# Patient Record
Sex: Female | Born: 1955 | Race: Black or African American | Hispanic: No | Marital: Single | Smoking: Former smoker
Health system: Southern US, Community
[De-identification: ages and names within clinical notes are randomized; demographics above are authoritative.]

## PROBLEM LIST (undated history)

## (undated) DIAGNOSIS — C50919 Malignant neoplasm of unspecified site of unspecified female breast: Secondary | ICD-10-CM

## (undated) DIAGNOSIS — I10 Essential (primary) hypertension: Secondary | ICD-10-CM

## (undated) DIAGNOSIS — B20 Human immunodeficiency virus [HIV] disease: Secondary | ICD-10-CM

## (undated) HISTORY — DX: Malignant neoplasm of unspecified site of unspecified female breast: C50.919

## (undated) HISTORY — PX: APPENDECTOMY: SHX54

## (undated) HISTORY — PX: ABDOMINAL HYSTERECTOMY: SHX81

---

## 1991-07-29 DIAGNOSIS — Z21 Asymptomatic human immunodeficiency virus [HIV] infection status: Secondary | ICD-10-CM

## 1991-07-29 DIAGNOSIS — B20 Human immunodeficiency virus [HIV] disease: Secondary | ICD-10-CM

## 1991-07-29 HISTORY — DX: Asymptomatic human immunodeficiency virus (hiv) infection status: Z21

## 1991-07-29 HISTORY — DX: Human immunodeficiency virus (HIV) disease: B20

## 1997-12-01 ENCOUNTER — Encounter: Admission: RE | Admit: 1997-12-01 | Discharge: 1997-12-01 | Payer: Self-pay | Admitting: Family Medicine

## 1997-12-15 ENCOUNTER — Encounter: Admission: RE | Admit: 1997-12-15 | Discharge: 1997-12-15 | Payer: Self-pay | Admitting: Family Medicine

## 1998-03-28 ENCOUNTER — Encounter: Admission: RE | Admit: 1998-03-28 | Discharge: 1998-03-28 | Payer: Self-pay | Admitting: Family Medicine

## 1998-05-16 ENCOUNTER — Encounter: Admission: RE | Admit: 1998-05-16 | Discharge: 1998-05-16 | Payer: Self-pay | Admitting: Family Medicine

## 1998-12-05 ENCOUNTER — Encounter: Admission: RE | Admit: 1998-12-05 | Discharge: 1998-12-05 | Payer: Self-pay | Admitting: Family Medicine

## 1999-08-07 ENCOUNTER — Encounter: Admission: RE | Admit: 1999-08-07 | Discharge: 1999-08-07 | Payer: Self-pay | Admitting: Family Medicine

## 1999-10-02 ENCOUNTER — Encounter: Admission: RE | Admit: 1999-10-02 | Discharge: 1999-10-02 | Payer: Self-pay | Admitting: Family Medicine

## 1999-12-11 ENCOUNTER — Encounter: Admission: RE | Admit: 1999-12-11 | Discharge: 1999-12-11 | Payer: Self-pay | Admitting: Family Medicine

## 2000-01-01 ENCOUNTER — Encounter: Admission: RE | Admit: 2000-01-01 | Discharge: 2000-01-01 | Payer: Self-pay | Admitting: Family Medicine

## 2000-11-25 ENCOUNTER — Encounter (INDEPENDENT_AMBULATORY_CARE_PROVIDER_SITE_OTHER): Payer: Self-pay | Admitting: *Deleted

## 2000-11-25 LAB — CONVERTED CEMR LAB

## 2000-11-27 ENCOUNTER — Encounter: Admission: RE | Admit: 2000-11-27 | Discharge: 2000-11-27 | Payer: Self-pay | Admitting: Family Medicine

## 2001-05-10 ENCOUNTER — Encounter: Admission: RE | Admit: 2001-05-10 | Discharge: 2001-05-10 | Payer: Self-pay | Admitting: Family Medicine

## 2001-05-19 ENCOUNTER — Encounter: Admission: RE | Admit: 2001-05-19 | Discharge: 2001-05-19 | Payer: Self-pay | Admitting: Family Medicine

## 2001-10-01 ENCOUNTER — Encounter: Admission: RE | Admit: 2001-10-01 | Discharge: 2001-10-01 | Payer: Self-pay | Admitting: Family Medicine

## 2001-10-04 ENCOUNTER — Encounter: Admission: RE | Admit: 2001-10-04 | Discharge: 2001-10-04 | Payer: Self-pay | Admitting: Family Medicine

## 2002-05-09 ENCOUNTER — Encounter: Admission: RE | Admit: 2002-05-09 | Discharge: 2002-05-09 | Payer: Self-pay | Admitting: Family Medicine

## 2002-05-23 ENCOUNTER — Encounter: Admission: RE | Admit: 2002-05-23 | Discharge: 2002-05-23 | Payer: Self-pay | Admitting: Family Medicine

## 2002-06-03 ENCOUNTER — Ambulatory Visit (HOSPITAL_COMMUNITY): Admission: RE | Admit: 2002-06-03 | Discharge: 2002-06-03 | Payer: Self-pay | Admitting: Family Medicine

## 2002-06-13 ENCOUNTER — Encounter: Admission: RE | Admit: 2002-06-13 | Discharge: 2002-06-13 | Payer: Self-pay | Admitting: Family Medicine

## 2002-06-22 ENCOUNTER — Encounter: Admission: RE | Admit: 2002-06-22 | Discharge: 2002-06-22 | Payer: Self-pay | Admitting: Family Medicine

## 2002-09-01 ENCOUNTER — Emergency Department (HOSPITAL_COMMUNITY): Admission: EM | Admit: 2002-09-01 | Discharge: 2002-09-01 | Payer: Self-pay | Admitting: Emergency Medicine

## 2002-12-26 ENCOUNTER — Encounter: Admission: RE | Admit: 2002-12-26 | Discharge: 2002-12-26 | Payer: Self-pay | Admitting: Sports Medicine

## 2003-01-12 ENCOUNTER — Encounter: Admission: RE | Admit: 2003-01-12 | Discharge: 2003-01-12 | Payer: Self-pay | Admitting: Family Medicine

## 2003-06-09 ENCOUNTER — Encounter: Admission: RE | Admit: 2003-06-09 | Discharge: 2003-06-09 | Payer: Self-pay | Admitting: Family Medicine

## 2003-06-14 ENCOUNTER — Ambulatory Visit (HOSPITAL_COMMUNITY): Admission: RE | Admit: 2003-06-14 | Discharge: 2003-06-14 | Payer: Self-pay | Admitting: Radiology

## 2003-06-30 ENCOUNTER — Encounter: Admission: RE | Admit: 2003-06-30 | Discharge: 2003-06-30 | Payer: Self-pay | Admitting: Family Medicine

## 2003-10-17 ENCOUNTER — Encounter: Admission: RE | Admit: 2003-10-17 | Discharge: 2003-10-17 | Payer: Self-pay | Admitting: Family Medicine

## 2004-01-23 ENCOUNTER — Encounter: Admission: RE | Admit: 2004-01-23 | Discharge: 2004-01-23 | Payer: Self-pay | Admitting: Family Medicine

## 2004-07-08 ENCOUNTER — Ambulatory Visit (HOSPITAL_COMMUNITY): Admission: RE | Admit: 2004-07-08 | Discharge: 2004-07-08 | Payer: Self-pay | Admitting: Family Medicine

## 2004-07-08 ENCOUNTER — Ambulatory Visit: Payer: Self-pay | Admitting: Family Medicine

## 2004-07-09 ENCOUNTER — Encounter: Admission: RE | Admit: 2004-07-09 | Discharge: 2004-07-09 | Payer: Self-pay | Admitting: Sports Medicine

## 2004-07-15 ENCOUNTER — Ambulatory Visit (HOSPITAL_COMMUNITY): Admission: RE | Admit: 2004-07-15 | Discharge: 2004-07-15 | Payer: Self-pay | Admitting: Family Medicine

## 2004-07-15 ENCOUNTER — Ambulatory Visit: Payer: Self-pay | Admitting: Sports Medicine

## 2004-08-07 ENCOUNTER — Ambulatory Visit: Payer: Self-pay | Admitting: Family Medicine

## 2004-08-28 ENCOUNTER — Encounter: Admission: RE | Admit: 2004-08-28 | Discharge: 2004-08-28 | Payer: Self-pay | Admitting: Sports Medicine

## 2004-09-10 ENCOUNTER — Ambulatory Visit: Payer: Self-pay | Admitting: Family Medicine

## 2005-05-14 ENCOUNTER — Ambulatory Visit (HOSPITAL_COMMUNITY): Admission: RE | Admit: 2005-05-14 | Discharge: 2005-05-14 | Payer: Self-pay | Admitting: Family Medicine

## 2005-05-14 ENCOUNTER — Ambulatory Visit: Payer: Self-pay | Admitting: Family Medicine

## 2005-06-13 ENCOUNTER — Ambulatory Visit: Payer: Self-pay | Admitting: Family Medicine

## 2005-07-24 ENCOUNTER — Ambulatory Visit (HOSPITAL_COMMUNITY): Admission: RE | Admit: 2005-07-24 | Discharge: 2005-07-24 | Payer: Self-pay | Admitting: Family Medicine

## 2005-11-13 ENCOUNTER — Other Ambulatory Visit: Admission: RE | Admit: 2005-11-13 | Discharge: 2005-11-13 | Payer: Self-pay | Admitting: Family Medicine

## 2005-11-13 ENCOUNTER — Ambulatory Visit: Payer: Self-pay | Admitting: Family Medicine

## 2005-12-16 ENCOUNTER — Ambulatory Visit: Payer: Self-pay | Admitting: Family Medicine

## 2005-12-19 ENCOUNTER — Ambulatory Visit: Payer: Self-pay | Admitting: Family Medicine

## 2006-04-09 ENCOUNTER — Ambulatory Visit: Payer: Self-pay | Admitting: Family Medicine

## 2006-04-09 ENCOUNTER — Other Ambulatory Visit: Admission: RE | Admit: 2006-04-09 | Discharge: 2006-04-09 | Payer: Self-pay | Admitting: Emergency Medicine

## 2006-07-27 ENCOUNTER — Ambulatory Visit (HOSPITAL_COMMUNITY): Admission: RE | Admit: 2006-07-27 | Discharge: 2006-07-27 | Payer: Self-pay | Admitting: Family Medicine

## 2006-08-05 ENCOUNTER — Encounter: Admission: RE | Admit: 2006-08-05 | Discharge: 2006-08-05 | Payer: Self-pay | Admitting: Sports Medicine

## 2006-08-13 ENCOUNTER — Ambulatory Visit: Payer: Self-pay | Admitting: Family Medicine

## 2006-08-13 ENCOUNTER — Encounter: Admission: RE | Admit: 2006-08-13 | Discharge: 2006-08-13 | Payer: Self-pay | Admitting: Sports Medicine

## 2006-09-25 ENCOUNTER — Encounter (INDEPENDENT_AMBULATORY_CARE_PROVIDER_SITE_OTHER): Payer: Self-pay | Admitting: *Deleted

## 2006-10-26 ENCOUNTER — Telehealth: Payer: Self-pay | Admitting: *Deleted

## 2006-10-26 ENCOUNTER — Ambulatory Visit: Payer: Self-pay | Admitting: Family Medicine

## 2006-10-26 DIAGNOSIS — Z889 Allergy status to unspecified drugs, medicaments and biological substances status: Secondary | ICD-10-CM | POA: Insufficient documentation

## 2006-10-26 DIAGNOSIS — B1710 Acute hepatitis C without hepatic coma: Secondary | ICD-10-CM | POA: Insufficient documentation

## 2006-10-26 DIAGNOSIS — B20 Human immunodeficiency virus [HIV] disease: Secondary | ICD-10-CM

## 2006-10-26 DIAGNOSIS — Z880 Allergy status to penicillin: Secondary | ICD-10-CM

## 2006-12-08 ENCOUNTER — Ambulatory Visit: Payer: Self-pay | Admitting: Family Medicine

## 2006-12-08 DIAGNOSIS — B029 Zoster without complications: Secondary | ICD-10-CM | POA: Insufficient documentation

## 2006-12-09 ENCOUNTER — Telehealth (INDEPENDENT_AMBULATORY_CARE_PROVIDER_SITE_OTHER): Payer: Self-pay | Admitting: *Deleted

## 2007-01-20 ENCOUNTER — Telehealth (INDEPENDENT_AMBULATORY_CARE_PROVIDER_SITE_OTHER): Payer: Self-pay | Admitting: *Deleted

## 2007-01-22 ENCOUNTER — Telehealth: Payer: Self-pay | Admitting: *Deleted

## 2007-01-26 ENCOUNTER — Inpatient Hospital Stay (HOSPITAL_COMMUNITY): Admission: AD | Admit: 2007-01-26 | Discharge: 2007-02-04 | Payer: Self-pay | Admitting: Family Medicine

## 2007-01-26 ENCOUNTER — Ambulatory Visit: Payer: Self-pay | Admitting: Family Medicine

## 2007-01-26 ENCOUNTER — Ambulatory Visit: Payer: Self-pay | Admitting: Infectious Diseases

## 2007-01-26 ENCOUNTER — Telehealth: Payer: Self-pay | Admitting: *Deleted

## 2007-01-26 DIAGNOSIS — I10 Essential (primary) hypertension: Secondary | ICD-10-CM

## 2007-02-10 ENCOUNTER — Encounter (INDEPENDENT_AMBULATORY_CARE_PROVIDER_SITE_OTHER): Payer: Self-pay | Admitting: Family Medicine

## 2007-02-10 ENCOUNTER — Ambulatory Visit: Payer: Self-pay | Admitting: Family Medicine

## 2007-02-10 ENCOUNTER — Encounter (INDEPENDENT_AMBULATORY_CARE_PROVIDER_SITE_OTHER): Payer: Self-pay | Admitting: *Deleted

## 2007-02-10 DIAGNOSIS — B459 Cryptococcosis, unspecified: Secondary | ICD-10-CM

## 2007-02-10 LAB — CONVERTED CEMR LAB
Alkaline Phosphatase: 62 units/L
BUN: 12 mg/dL
CO2, serum: 25 mmol/L
Chloride, Serum: 104 mmol/L
HCT: 30.7 %
Hemoglobin: 9.6 g/dL
Lymphocytes Relative: 32 %
MCV: 84.1 fL
Sodium, serum: 140 mmol/L
Total Bilirubin: 0.4 mg/dL
Total Protein: 7.8 g/dL
platelet count: 153 10*3/uL

## 2007-02-12 ENCOUNTER — Telehealth (INDEPENDENT_AMBULATORY_CARE_PROVIDER_SITE_OTHER): Payer: Self-pay | Admitting: Family Medicine

## 2007-02-12 LAB — CONVERTED CEMR LAB
ALT: 24 units/L (ref 0–35)
BUN: 12 mg/dL (ref 6–23)
Basophils Absolute: 0 10*3/uL (ref 0.0–0.1)
Basophils Relative: 1 % (ref 0–1)
Eosinophils Absolute: 0.1 10*3/uL (ref 0.0–0.7)
Glucose, Bld: 98 mg/dL (ref 70–99)
HCT: 30.7 % — ABNORMAL LOW (ref 36.0–46.0)
Hemoglobin: 9.6 g/dL — ABNORMAL LOW (ref 12.0–15.0)
Lymphocytes Relative: 32 % (ref 12–46)
MCHC: 31.3 g/dL (ref 30.0–36.0)
MCV: 84.1 fL (ref 78.0–100.0)
Monocytes Relative: 20 % — ABNORMAL HIGH (ref 3–11)
Platelets: 153 10*3/uL (ref 150–400)
RBC: 3.65 M/uL — ABNORMAL LOW (ref 3.87–5.11)
RDW: 15.4 % — ABNORMAL HIGH (ref 11.5–14.0)
Sodium: 140 meq/L (ref 135–145)
Total Bilirubin: 0.4 mg/dL (ref 0.3–1.2)
Total Protein: 7.8 g/dL (ref 6.0–8.3)

## 2007-02-15 ENCOUNTER — Encounter (INDEPENDENT_AMBULATORY_CARE_PROVIDER_SITE_OTHER): Payer: Self-pay | Admitting: Family Medicine

## 2007-02-22 ENCOUNTER — Encounter (INDEPENDENT_AMBULATORY_CARE_PROVIDER_SITE_OTHER): Payer: Self-pay | Admitting: *Deleted

## 2007-02-22 ENCOUNTER — Ambulatory Visit: Payer: Self-pay | Admitting: Family Medicine

## 2007-03-04 ENCOUNTER — Encounter (INDEPENDENT_AMBULATORY_CARE_PROVIDER_SITE_OTHER): Payer: Self-pay | Admitting: *Deleted

## 2007-03-08 ENCOUNTER — Ambulatory Visit: Payer: Self-pay | Admitting: Infectious Diseases

## 2007-03-09 ENCOUNTER — Telehealth: Payer: Self-pay | Admitting: Infectious Diseases

## 2007-03-16 ENCOUNTER — Telehealth: Payer: Self-pay

## 2007-04-09 ENCOUNTER — Telehealth: Payer: Self-pay | Admitting: Infectious Diseases

## 2007-04-27 ENCOUNTER — Ambulatory Visit: Payer: Self-pay | Admitting: Infectious Diseases

## 2007-04-27 ENCOUNTER — Telehealth: Payer: Self-pay | Admitting: Infectious Diseases

## 2007-04-27 ENCOUNTER — Encounter: Admission: RE | Admit: 2007-04-27 | Discharge: 2007-04-27 | Payer: Self-pay | Admitting: Infectious Diseases

## 2007-04-27 LAB — CONVERTED CEMR LAB
AST: 30 units/L (ref 0–37)
Albumin: 4.2 g/dL (ref 3.5–5.2)
Basophils Relative: 0 % (ref 0–1)
Calcium: 9.9 mg/dL (ref 8.4–10.5)
Chloride: 106 meq/L (ref 96–112)
Creatinine, Ser: 1.13 mg/dL (ref 0.40–1.20)
Eosinophils Absolute: 0.1 10*3/uL (ref 0.0–0.7)
HIV-1 RNA Quant, Log: 4.58 — ABNORMAL HIGH (ref <1.70)
Hemoglobin: 9.5 g/dL — ABNORMAL LOW (ref 12.0–15.0)
Lymphocytes Relative: 29 % (ref 12–46)
MCHC: 32.9 g/dL (ref 30.0–36.0)
Monocytes Relative: 14 % — ABNORMAL HIGH (ref 3–11)
Neutro Abs: 1.4 10*3/uL — ABNORMAL LOW (ref 1.7–7.7)
Neutrophils Relative %: 53 % (ref 43–77)
Platelets: 145 10*3/uL — ABNORMAL LOW (ref 150–400)
Potassium: 4.5 meq/L (ref 3.5–5.3)
RDW: 14.6 % — ABNORMAL HIGH (ref 11.5–14.0)
Sodium: 144 meq/L (ref 135–145)
WBC: 2.7 10*3/uL — ABNORMAL LOW (ref 4.0–10.5)

## 2007-05-07 ENCOUNTER — Telehealth: Payer: Self-pay | Admitting: Infectious Diseases

## 2007-05-07 ENCOUNTER — Ambulatory Visit: Payer: Self-pay | Admitting: Infectious Diseases

## 2007-06-07 ENCOUNTER — Telehealth: Payer: Self-pay | Admitting: Infectious Diseases

## 2007-07-02 ENCOUNTER — Ambulatory Visit: Payer: Self-pay | Admitting: Infectious Diseases

## 2007-07-02 LAB — CONVERTED CEMR LAB
ALT: 13 units/L (ref 0–35)
AST: 22 units/L (ref 0–37)
BUN: 24 mg/dL — ABNORMAL HIGH (ref 6–23)
Basophils Absolute: 0 10*3/uL (ref 0.0–0.1)
CD4 T Helper %: 4 % — ABNORMAL LOW (ref 32–62)
CO2: 22 meq/L (ref 19–32)
Chloride: 106 meq/L (ref 96–112)
Creatinine, Ser: 0.97 mg/dL (ref 0.40–1.20)
Eosinophils Relative: 3 % (ref 0–5)
Glucose, Bld: 101 mg/dL — ABNORMAL HIGH (ref 70–99)
HCT: 31.9 % — ABNORMAL LOW (ref 36.0–46.0)
HIV-1 RNA Quant, Log: 4.42 — ABNORMAL HIGH (ref <1.70)
Hemoglobin: 10 g/dL — ABNORMAL LOW (ref 12.0–15.0)
MCHC: 31.3 g/dL (ref 30.0–36.0)
MCV: 87.6 fL (ref 78.0–100.0)
Monocytes Absolute: 0.6 10*3/uL (ref 0.1–1.0)
Neutro Abs: 1.7 10*3/uL (ref 1.7–7.7)
Platelets: 153 10*3/uL (ref 150–400)
RDW: 12.4 % (ref 11.5–15.5)
Total Bilirubin: 0.2 mg/dL — ABNORMAL LOW (ref 0.3–1.2)
Total Protein: 7.3 g/dL (ref 6.0–8.3)
Total lymphocyte count: 1190 cells/mcL (ref 700–3300)
WBC: 3.5 10*3/uL — ABNORMAL LOW (ref 4.0–10.5)

## 2007-07-06 ENCOUNTER — Telehealth: Payer: Self-pay | Admitting: Infectious Diseases

## 2007-07-26 ENCOUNTER — Encounter: Payer: Self-pay | Admitting: Infectious Diseases

## 2007-07-28 ENCOUNTER — Encounter (INDEPENDENT_AMBULATORY_CARE_PROVIDER_SITE_OTHER): Payer: Self-pay | Admitting: *Deleted

## 2007-08-06 ENCOUNTER — Telehealth: Payer: Self-pay | Admitting: Infectious Diseases

## 2007-08-10 ENCOUNTER — Ambulatory Visit (HOSPITAL_COMMUNITY): Admission: RE | Admit: 2007-08-10 | Discharge: 2007-08-10 | Payer: Self-pay | Admitting: Family Medicine

## 2007-08-14 ENCOUNTER — Encounter: Payer: Self-pay | Admitting: Infectious Diseases

## 2007-09-07 ENCOUNTER — Telehealth: Payer: Self-pay | Admitting: Infectious Diseases

## 2007-09-30 ENCOUNTER — Encounter (INDEPENDENT_AMBULATORY_CARE_PROVIDER_SITE_OTHER): Payer: Self-pay | Admitting: *Deleted

## 2007-10-01 ENCOUNTER — Ambulatory Visit: Payer: Self-pay | Admitting: Family Medicine

## 2007-10-01 ENCOUNTER — Encounter (INDEPENDENT_AMBULATORY_CARE_PROVIDER_SITE_OTHER): Payer: Self-pay | Admitting: Family Medicine

## 2007-10-01 DIAGNOSIS — H5310 Unspecified subjective visual disturbances: Secondary | ICD-10-CM | POA: Insufficient documentation

## 2007-10-04 ENCOUNTER — Encounter (INDEPENDENT_AMBULATORY_CARE_PROVIDER_SITE_OTHER): Payer: Self-pay | Admitting: Family Medicine

## 2007-10-04 ENCOUNTER — Ambulatory Visit: Payer: Self-pay | Admitting: Sports Medicine

## 2007-10-04 LAB — CONVERTED CEMR LAB
ALT: 17 units/L (ref 0–35)
Absolute CD4: 43 #/uL — ABNORMAL LOW (ref 381–1469)
Alkaline Phosphatase: 53 units/L (ref 39–117)
BUN: 16 mg/dL (ref 6–23)
Basophils Absolute: 0 10*3/uL (ref 0.0–0.1)
Cholesterol: 164 mg/dL (ref 0–200)
Creatinine, Ser: 0.95 mg/dL (ref 0.40–1.20)
Eosinophils Absolute: 0 10*3/uL (ref 0.0–0.7)
HCT: 28.8 % — ABNORMAL LOW (ref 36.0–46.0)
Lymphocytes Relative: 21 % (ref 12–46)
MCV: 85 fL (ref 78.0–100.0)
Monocytes Relative: 24 % — ABNORMAL HIGH (ref 3–12)
Neutro Abs: 1.2 10*3/uL — ABNORMAL LOW (ref 1.7–7.7)
Neutrophils Relative %: 54 % (ref 43–77)
Platelets: 120 10*3/uL — ABNORMAL LOW (ref 150–400)
Potassium: 4 meq/L (ref 3.5–5.3)
RBC: 3.39 M/uL — ABNORMAL LOW (ref 3.87–5.11)
RDW: 12.3 % (ref 11.5–15.5)
Total Bilirubin: 0.3 mg/dL (ref 0.3–1.2)
Total CHOL/HDL Ratio: 3.9
Total Protein: 7.2 g/dL (ref 6.0–8.3)
Total lymphocyte count: 483 cells/mcL — ABNORMAL LOW (ref 700–3300)
VLDL: 26 mg/dL (ref 0–40)

## 2007-10-07 ENCOUNTER — Encounter: Admission: RE | Admit: 2007-10-07 | Discharge: 2007-10-07 | Payer: Self-pay | Admitting: Infectious Diseases

## 2007-10-07 ENCOUNTER — Telehealth: Payer: Self-pay | Admitting: Infectious Diseases

## 2007-10-07 ENCOUNTER — Ambulatory Visit: Payer: Self-pay | Admitting: Infectious Diseases

## 2007-10-07 LAB — CONVERTED CEMR LAB
Basophils Absolute: 0 10*3/uL (ref 0.0–0.1)
Basophils Relative: 0 % (ref 0–1)
Eosinophils Absolute: 0 10*3/uL (ref 0.0–0.7)
HIV 1 RNA Quant: 542000 copies/mL — ABNORMAL HIGH (ref <50)
HIV-1 RNA Quant, Log: 5.73 — ABNORMAL HIGH (ref <1.70)
Hemoglobin: 9.1 g/dL — ABNORMAL LOW (ref 12.0–15.0)
Monocytes Relative: 15 % — ABNORMAL HIGH (ref 3–12)
Neutro Abs: 1.3 10*3/uL — ABNORMAL LOW (ref 1.7–7.7)
Neutrophils Relative %: 69 % (ref 43–77)
Platelets: 124 10*3/uL — ABNORMAL LOW (ref 150–400)
RBC: 3.4 M/uL — ABNORMAL LOW (ref 3.87–5.11)

## 2007-10-08 ENCOUNTER — Encounter (INDEPENDENT_AMBULATORY_CARE_PROVIDER_SITE_OTHER): Payer: Self-pay | Admitting: Family Medicine

## 2007-10-08 LAB — CONVERTED CEMR LAB
OCCULT 1: NEGATIVE
OCCULT 2: NEGATIVE

## 2007-10-13 ENCOUNTER — Telehealth: Payer: Self-pay | Admitting: Infectious Diseases

## 2007-11-11 ENCOUNTER — Telehealth (INDEPENDENT_AMBULATORY_CARE_PROVIDER_SITE_OTHER): Payer: Self-pay | Admitting: *Deleted

## 2007-11-12 ENCOUNTER — Ambulatory Visit: Payer: Self-pay | Admitting: Infectious Diseases

## 2007-11-15 ENCOUNTER — Telehealth (INDEPENDENT_AMBULATORY_CARE_PROVIDER_SITE_OTHER): Payer: Self-pay | Admitting: *Deleted

## 2007-12-15 ENCOUNTER — Telehealth (INDEPENDENT_AMBULATORY_CARE_PROVIDER_SITE_OTHER): Payer: Self-pay | Admitting: *Deleted

## 2008-01-03 ENCOUNTER — Telehealth (INDEPENDENT_AMBULATORY_CARE_PROVIDER_SITE_OTHER): Payer: Self-pay | Admitting: *Deleted

## 2008-01-10 ENCOUNTER — Telehealth (INDEPENDENT_AMBULATORY_CARE_PROVIDER_SITE_OTHER): Payer: Self-pay | Admitting: *Deleted

## 2008-01-24 ENCOUNTER — Encounter: Admission: RE | Admit: 2008-01-24 | Discharge: 2008-01-24 | Payer: Self-pay | Admitting: Infectious Diseases

## 2008-01-24 ENCOUNTER — Ambulatory Visit: Payer: Self-pay | Admitting: Infectious Diseases

## 2008-01-24 LAB — CONVERTED CEMR LAB
ALT: 10 units/L (ref 0–35)
AST: 23 units/L (ref 0–37)
Albumin: 4.2 g/dL (ref 3.5–5.2)
Alkaline Phosphatase: 60 units/L (ref 39–117)
Basophils Relative: 0 % (ref 0–1)
Eosinophils Absolute: 0.1 10*3/uL (ref 0.0–0.7)
Glucose, Bld: 109 mg/dL — ABNORMAL HIGH (ref 70–99)
HIV-1 RNA Quant, Log: 4.7 — ABNORMAL HIGH (ref <1.70)
Lymphocytes Relative: 39 % (ref 12–46)
Lymphs Abs: 1.2 10*3/uL (ref 0.7–4.0)
MCV: 84.3 fL (ref 78.0–100.0)
Monocytes Absolute: 0.5 10*3/uL (ref 0.1–1.0)
Monocytes Relative: 15 % — ABNORMAL HIGH (ref 3–12)
Neutro Abs: 1.4 10*3/uL — ABNORMAL LOW (ref 1.7–7.7)
Neutrophils Relative %: 44 % (ref 43–77)
RBC: 3.88 M/uL (ref 3.87–5.11)
RDW: 13.5 % (ref 11.5–15.5)

## 2008-02-09 ENCOUNTER — Telehealth (INDEPENDENT_AMBULATORY_CARE_PROVIDER_SITE_OTHER): Payer: Self-pay | Admitting: *Deleted

## 2008-02-16 ENCOUNTER — Ambulatory Visit: Payer: Self-pay | Admitting: Infectious Diseases

## 2008-03-07 ENCOUNTER — Telehealth (INDEPENDENT_AMBULATORY_CARE_PROVIDER_SITE_OTHER): Payer: Self-pay | Admitting: *Deleted

## 2008-04-10 ENCOUNTER — Telehealth (INDEPENDENT_AMBULATORY_CARE_PROVIDER_SITE_OTHER): Payer: Self-pay | Admitting: *Deleted

## 2008-04-12 ENCOUNTER — Encounter (INDEPENDENT_AMBULATORY_CARE_PROVIDER_SITE_OTHER): Payer: Self-pay | Admitting: *Deleted

## 2008-05-05 ENCOUNTER — Telehealth (INDEPENDENT_AMBULATORY_CARE_PROVIDER_SITE_OTHER): Payer: Self-pay | Admitting: *Deleted

## 2008-05-09 ENCOUNTER — Ambulatory Visit: Payer: Self-pay | Admitting: Infectious Disease

## 2008-05-25 ENCOUNTER — Ambulatory Visit: Payer: Self-pay | Admitting: Infectious Diseases

## 2008-05-25 LAB — CONVERTED CEMR LAB
Basophils Relative: 1 % (ref 0–1)
CO2: 25 meq/L (ref 19–32)
Chloride: 105 meq/L (ref 96–112)
HIV 1 RNA Quant: 28500 copies/mL — ABNORMAL HIGH (ref <50)
HIV-1 RNA Quant, Log: 4.45 — ABNORMAL HIGH (ref <1.70)
Lymphs Abs: 0.8 10*3/uL (ref 0.7–4.0)
Monocytes Absolute: 0.4 10*3/uL (ref 0.1–1.0)
Neutro Abs: 0.9 10*3/uL — ABNORMAL LOW (ref 1.7–7.7)
Platelets: 145 10*3/uL — ABNORMAL LOW (ref 150–400)
Potassium: 4.1 meq/L (ref 3.5–5.3)
RBC: 4.02 M/uL (ref 3.87–5.11)
Sodium: 141 meq/L (ref 135–145)
Total Bilirubin: 0.4 mg/dL (ref 0.3–1.2)
WBC: 2.2 10*3/uL — ABNORMAL LOW (ref 4.0–10.5)

## 2008-06-07 ENCOUNTER — Telehealth (INDEPENDENT_AMBULATORY_CARE_PROVIDER_SITE_OTHER): Payer: Self-pay | Admitting: *Deleted

## 2008-06-15 ENCOUNTER — Ambulatory Visit: Payer: Self-pay | Admitting: Infectious Diseases

## 2008-07-06 ENCOUNTER — Telehealth (INDEPENDENT_AMBULATORY_CARE_PROVIDER_SITE_OTHER): Payer: Self-pay | Admitting: *Deleted

## 2008-08-03 ENCOUNTER — Telehealth (INDEPENDENT_AMBULATORY_CARE_PROVIDER_SITE_OTHER): Payer: Self-pay | Admitting: *Deleted

## 2008-08-17 ENCOUNTER — Ambulatory Visit (HOSPITAL_COMMUNITY): Admission: RE | Admit: 2008-08-17 | Discharge: 2008-08-17 | Payer: Self-pay | Admitting: Infectious Diseases

## 2008-08-29 ENCOUNTER — Telehealth (INDEPENDENT_AMBULATORY_CARE_PROVIDER_SITE_OTHER): Payer: Self-pay | Admitting: *Deleted

## 2008-08-29 ENCOUNTER — Ambulatory Visit: Payer: Self-pay | Admitting: Internal Medicine

## 2008-08-29 ENCOUNTER — Encounter: Payer: Self-pay | Admitting: Infectious Diseases

## 2008-08-30 ENCOUNTER — Telehealth (INDEPENDENT_AMBULATORY_CARE_PROVIDER_SITE_OTHER): Payer: Self-pay | Admitting: *Deleted

## 2008-09-01 ENCOUNTER — Telehealth (INDEPENDENT_AMBULATORY_CARE_PROVIDER_SITE_OTHER): Payer: Self-pay | Admitting: *Deleted

## 2008-09-19 ENCOUNTER — Encounter (INDEPENDENT_AMBULATORY_CARE_PROVIDER_SITE_OTHER): Payer: Self-pay | Admitting: *Deleted

## 2008-10-04 ENCOUNTER — Telehealth (INDEPENDENT_AMBULATORY_CARE_PROVIDER_SITE_OTHER): Payer: Self-pay | Admitting: *Deleted

## 2008-10-30 ENCOUNTER — Telehealth (INDEPENDENT_AMBULATORY_CARE_PROVIDER_SITE_OTHER): Payer: Self-pay | Admitting: *Deleted

## 2008-11-21 ENCOUNTER — Ambulatory Visit: Payer: Self-pay | Admitting: Infectious Diseases

## 2008-11-21 LAB — CONVERTED CEMR LAB
Albumin: 4.1 g/dL (ref 3.5–5.2)
BUN: 13 mg/dL (ref 6–23)
Basophils Relative: 0 % (ref 0–1)
CO2: 23 meq/L (ref 19–32)
Creatinine, Ser: 0.95 mg/dL (ref 0.40–1.20)
Eosinophils Relative: 1 % (ref 0–5)
Glucose, Bld: 94 mg/dL (ref 70–99)
HCT: 32.1 % — ABNORMAL LOW (ref 36.0–46.0)
HDL: 47 mg/dL (ref >39)
HIV 1 RNA Quant: 693000 copies/mL — ABNORMAL HIGH (ref <48)
HIV-1 RNA Quant, Log: 5.84 — ABNORMAL HIGH (ref <1.68)
LDL Cholesterol: 91 mg/dL (ref 0–99)
Lymphocytes Relative: 25 % (ref 12–46)
Lymphs Abs: 0.5 10*3/uL — ABNORMAL LOW (ref 0.7–4.0)
MCHC: 31.5 g/dL (ref 30.0–36.0)
MCV: 84.7 fL (ref 78.0–100.0)
Neutro Abs: 1 10*3/uL — ABNORMAL LOW (ref 1.7–7.7)
Neutrophils Relative %: 53 % (ref 43–77)
Platelets: 99 10*3/uL — ABNORMAL LOW (ref 150–400)
Potassium: 4.4 meq/L (ref 3.5–5.3)
Sodium: 143 meq/L (ref 135–145)
Total Bilirubin: 0.3 mg/dL (ref 0.3–1.2)
Total CHOL/HDL Ratio: 3.5
WBC: 2 10*3/uL — ABNORMAL LOW (ref 4.0–10.5)

## 2008-11-22 ENCOUNTER — Telehealth (INDEPENDENT_AMBULATORY_CARE_PROVIDER_SITE_OTHER): Payer: Self-pay | Admitting: *Deleted

## 2008-12-14 ENCOUNTER — Ambulatory Visit: Payer: Self-pay | Admitting: Infectious Diseases

## 2008-12-21 ENCOUNTER — Telehealth (INDEPENDENT_AMBULATORY_CARE_PROVIDER_SITE_OTHER): Payer: Self-pay | Admitting: *Deleted

## 2009-01-17 ENCOUNTER — Encounter: Payer: Self-pay | Admitting: Internal Medicine

## 2009-01-23 ENCOUNTER — Telehealth (INDEPENDENT_AMBULATORY_CARE_PROVIDER_SITE_OTHER): Payer: Self-pay | Admitting: *Deleted

## 2009-02-20 ENCOUNTER — Telehealth (INDEPENDENT_AMBULATORY_CARE_PROVIDER_SITE_OTHER): Payer: Self-pay | Admitting: *Deleted

## 2009-03-15 ENCOUNTER — Telehealth (INDEPENDENT_AMBULATORY_CARE_PROVIDER_SITE_OTHER): Payer: Self-pay | Admitting: *Deleted

## 2009-03-23 ENCOUNTER — Telehealth: Payer: Self-pay | Admitting: Infectious Diseases

## 2009-04-17 ENCOUNTER — Encounter (INDEPENDENT_AMBULATORY_CARE_PROVIDER_SITE_OTHER): Payer: Self-pay | Admitting: *Deleted

## 2009-04-18 ENCOUNTER — Telehealth (INDEPENDENT_AMBULATORY_CARE_PROVIDER_SITE_OTHER): Payer: Self-pay | Admitting: *Deleted

## 2009-05-02 ENCOUNTER — Encounter: Payer: Self-pay | Admitting: Infectious Diseases

## 2009-05-15 ENCOUNTER — Ambulatory Visit: Payer: Self-pay | Admitting: Infectious Diseases

## 2009-05-18 ENCOUNTER — Telehealth (INDEPENDENT_AMBULATORY_CARE_PROVIDER_SITE_OTHER): Payer: Self-pay | Admitting: *Deleted

## 2009-06-05 ENCOUNTER — Telehealth: Payer: Self-pay | Admitting: Infectious Diseases

## 2009-06-05 ENCOUNTER — Ambulatory Visit: Payer: Self-pay | Admitting: Infectious Diseases

## 2009-06-05 LAB — CONVERTED CEMR LAB
AST: 31 units/L (ref 0–37)
BUN: 14 mg/dL (ref 6–23)
CO2: 25 meq/L (ref 19–32)
Chloride: 103 meq/L (ref 96–112)
Eosinophils Relative: 1 % (ref 0–5)
Glucose, Bld: 90 mg/dL (ref 70–99)
HIV 1 RNA Quant: 423000 copies/mL — ABNORMAL HIGH (ref <48)
HIV-1 RNA Quant, Log: 5.63 — ABNORMAL HIGH (ref <1.68)
Lymphocytes Relative: 29 % (ref 12–46)
Lymphs Abs: 0.7 10*3/uL (ref 0.7–4.0)
Monocytes Absolute: 0.5 10*3/uL (ref 0.1–1.0)
Neutro Abs: 1.2 10*3/uL — ABNORMAL LOW (ref 1.7–7.7)
Neutrophils Relative %: 49 % (ref 43–77)

## 2009-06-14 ENCOUNTER — Ambulatory Visit: Payer: Self-pay | Admitting: Infectious Diseases

## 2009-06-18 ENCOUNTER — Encounter: Payer: Self-pay | Admitting: Infectious Diseases

## 2009-06-18 ENCOUNTER — Telehealth (INDEPENDENT_AMBULATORY_CARE_PROVIDER_SITE_OTHER): Payer: Self-pay | Admitting: *Deleted

## 2009-06-25 ENCOUNTER — Encounter: Payer: Self-pay | Admitting: Infectious Diseases

## 2009-08-08 ENCOUNTER — Ambulatory Visit: Payer: Self-pay | Admitting: Infectious Diseases

## 2009-08-08 LAB — CONVERTED CEMR LAB
ALT: 25 units/L (ref 0–35)
AST: 31 units/L (ref 0–37)
BUN: 13 mg/dL (ref 6–23)
Basophils Absolute: 0 10*3/uL (ref 0.0–0.1)
Basophils Relative: 0 % (ref 0–1)
CO2: 28 meq/L (ref 19–32)
Eosinophils Absolute: 0.1 10*3/uL (ref 0.0–0.7)
Hep A Total Ab: POSITIVE — AB
Hep B Core Total Ab: POSITIVE — AB
Hepatitis B Surface Ag: NEGATIVE
Lymphocytes Relative: 47 % — ABNORMAL HIGH (ref 12–46)
Neutro Abs: 2.1 10*3/uL (ref 1.7–7.7)
Neutrophils Relative %: 41 % — ABNORMAL LOW (ref 43–77)
Platelets: 175 10*3/uL (ref 150–400)
Potassium: 4.3 meq/L (ref 3.5–5.3)
RBC: 4.1 M/uL (ref 3.87–5.11)
Total Protein: 7.3 g/dL (ref 6.0–8.3)
VLDL: 17 mg/dL (ref 0–40)
WBC: 5.2 10*3/uL (ref 4.0–10.5)

## 2009-08-13 ENCOUNTER — Telehealth (INDEPENDENT_AMBULATORY_CARE_PROVIDER_SITE_OTHER): Payer: Self-pay | Admitting: *Deleted

## 2009-08-14 ENCOUNTER — Telehealth: Payer: Self-pay | Admitting: Infectious Diseases

## 2009-08-21 ENCOUNTER — Ambulatory Visit (HOSPITAL_COMMUNITY): Admission: RE | Admit: 2009-08-21 | Discharge: 2009-08-21 | Payer: Self-pay | Admitting: Infectious Diseases

## 2009-08-23 ENCOUNTER — Ambulatory Visit: Payer: Self-pay | Admitting: Infectious Diseases

## 2009-08-27 ENCOUNTER — Encounter: Payer: Self-pay | Admitting: Infectious Diseases

## 2009-09-06 ENCOUNTER — Telehealth (INDEPENDENT_AMBULATORY_CARE_PROVIDER_SITE_OTHER): Payer: Self-pay | Admitting: *Deleted

## 2009-09-26 ENCOUNTER — Telehealth: Payer: Self-pay | Admitting: Internal Medicine

## 2009-11-08 ENCOUNTER — Telehealth (INDEPENDENT_AMBULATORY_CARE_PROVIDER_SITE_OTHER): Payer: Self-pay | Admitting: *Deleted

## 2009-12-06 ENCOUNTER — Ambulatory Visit: Payer: Self-pay | Admitting: Infectious Diseases

## 2009-12-06 LAB — CONVERTED CEMR LAB
AST: 21 units/L (ref 0–37)
Albumin: 4 g/dL (ref 3.5–5.2)
Alkaline Phosphatase: 76 units/L (ref 39–117)
BUN: 16 mg/dL (ref 6–23)
Creatinine, Ser: 1.06 mg/dL (ref 0.40–1.20)
Glucose, Bld: 86 mg/dL (ref 70–99)
HCT: 33.8 % — ABNORMAL LOW (ref 36.0–46.0)
HIV 1 RNA Quant: <48 copies/mL (ref <48)
Hemoglobin: 11 g/dL — ABNORMAL LOW (ref 12.0–15.0)
Lymphocytes Relative: 39 % (ref 12–46)
MCHC: 32.5 g/dL (ref 30.0–36.0)
MCV: 82.8 fL (ref 78.0–100.0)
Monocytes Relative: 11 % (ref 3–12)
Neutrophils Relative %: 49 % (ref 43–77)
Sodium: 142 meq/L (ref 135–145)

## 2009-12-07 ENCOUNTER — Telehealth (INDEPENDENT_AMBULATORY_CARE_PROVIDER_SITE_OTHER): Payer: Self-pay | Admitting: *Deleted

## 2009-12-20 ENCOUNTER — Ambulatory Visit: Payer: Self-pay | Admitting: Infectious Diseases

## 2010-01-03 ENCOUNTER — Telehealth (INDEPENDENT_AMBULATORY_CARE_PROVIDER_SITE_OTHER): Payer: Self-pay | Admitting: *Deleted

## 2010-01-11 ENCOUNTER — Encounter: Payer: Self-pay | Admitting: Family Medicine

## 2010-01-29 ENCOUNTER — Telehealth (INDEPENDENT_AMBULATORY_CARE_PROVIDER_SITE_OTHER): Payer: Self-pay | Admitting: *Deleted

## 2010-02-27 ENCOUNTER — Telehealth (INDEPENDENT_AMBULATORY_CARE_PROVIDER_SITE_OTHER): Payer: Self-pay | Admitting: *Deleted

## 2010-03-22 ENCOUNTER — Telehealth (INDEPENDENT_AMBULATORY_CARE_PROVIDER_SITE_OTHER): Payer: Self-pay | Admitting: *Deleted

## 2010-04-08 ENCOUNTER — Ambulatory Visit: Payer: Self-pay | Admitting: Infectious Diseases

## 2010-04-08 ENCOUNTER — Encounter (INDEPENDENT_AMBULATORY_CARE_PROVIDER_SITE_OTHER): Payer: Self-pay | Admitting: Family Medicine

## 2010-04-08 LAB — CONVERTED CEMR LAB
ALT: 11 units/L (ref 0–35)
AST: 22 units/L (ref 0–37)
Albumin: 4.1 g/dL (ref 3.5–5.2)
Basophils Absolute: 0 10*3/uL (ref 0.0–0.1)
Basophils Relative: 1 % (ref 0–1)
Calcium: 9 mg/dL (ref 8.4–10.5)
Chloride: 107 meq/L (ref 96–112)
Creatinine, Ser: 0.95 mg/dL (ref 0.40–1.20)
MCHC: 30.9 g/dL (ref 30.0–36.0)
Neutro Abs: 2.2 10*3/uL (ref 1.7–7.7)
Neutrophils Relative %: 54 % (ref 43–77)
Platelets: 153 10*3/uL (ref 150–400)
Potassium: 4.5 meq/L (ref 3.5–5.3)
RDW: 13.5 % (ref 11.5–15.5)
Sodium: 143 meq/L (ref 135–145)

## 2010-04-22 ENCOUNTER — Telehealth (INDEPENDENT_AMBULATORY_CARE_PROVIDER_SITE_OTHER): Payer: Self-pay | Admitting: *Deleted

## 2010-04-25 ENCOUNTER — Ambulatory Visit: Payer: Self-pay | Admitting: Infectious Diseases

## 2010-05-30 ENCOUNTER — Encounter: Payer: Self-pay | Admitting: Infectious Diseases

## 2010-05-30 LAB — CONVERTED CEMR LAB
AST: 21 units/L (ref 0–37)
Alkaline Phosphatase: 49 units/L (ref 39–117)
BUN: 13 mg/dL (ref 6–23)
Basophils Relative: 0 % (ref 0–1)
Creatinine, Ser: 0.63 mg/dL (ref 0.40–1.20)
Eosinophils Absolute: 0.2 10*3/uL (ref 0.0–0.7)
MCHC: 33.7 g/dL (ref 30.0–36.0)
MCV: 116.7 fL — ABNORMAL HIGH (ref 78.0–100.0)
Monocytes Absolute: 0.6 10*3/uL (ref 0.1–1.0)
Monocytes Relative: 8 % (ref 3–12)
Neutrophils Relative %: 49 % (ref 43–77)
Potassium: 4.8 meq/L (ref 3.5–5.3)
RBC: 3.71 M/uL — ABNORMAL LOW (ref 3.87–5.11)
Total Bilirubin: 0.6 mg/dL (ref 0.3–1.2)

## 2010-06-19 ENCOUNTER — Encounter (INDEPENDENT_AMBULATORY_CARE_PROVIDER_SITE_OTHER): Payer: Self-pay | Admitting: *Deleted

## 2010-08-19 ENCOUNTER — Encounter: Payer: Self-pay | Admitting: Adult Health

## 2010-08-22 ENCOUNTER — Ambulatory Visit (HOSPITAL_COMMUNITY)
Admission: RE | Admit: 2010-08-22 | Discharge: 2010-08-22 | Payer: Self-pay | Source: Home / Self Care | Attending: Infectious Diseases | Admitting: Infectious Diseases

## 2010-08-27 ENCOUNTER — Ambulatory Visit
Admission: RE | Admit: 2010-08-27 | Discharge: 2010-08-27 | Payer: Self-pay | Source: Home / Self Care | Attending: Infectious Diseases | Admitting: Infectious Diseases

## 2010-08-27 ENCOUNTER — Encounter: Payer: Self-pay | Admitting: Infectious Diseases

## 2010-08-27 LAB — CONVERTED CEMR LAB
ALT: 11 units/L (ref 0–35)
AST: 22 units/L (ref 0–37)
Basophils Absolute: 0 10*3/uL (ref 0.0–0.1)
Calcium: 9.5 mg/dL (ref 8.4–10.5)
Chloride: 105 meq/L (ref 96–112)
Creatinine, Ser: 0.91 mg/dL (ref 0.40–1.20)
Eosinophils Absolute: 0.2 10*3/uL (ref 0.0–0.7)
Eosinophils Relative: 3 % (ref 0–5)
HCT: 37.4 % (ref 36.0–46.0)
HIV 1 RNA Quant: <20 copies/mL (ref <20)
Lymphocytes Relative: 37 % (ref 12–46)
MCV: 85.6 fL (ref 78.0–100.0)
Platelets: 189 10*3/uL (ref 150–400)
RDW: 13.4 % (ref 11.5–15.5)
Sodium: 140 meq/L (ref 135–145)

## 2010-08-27 NOTE — Progress Notes (Signed)
Summary: [place]/pt assist meds arrived for May  Phone Note Refill Request      Prescriptions: SEPTRA DS 800-160 MG  TABS (SULFAMETHOXAZOLE-TRIMETHOPRIM) Take 1 tablet by mouth once a day  #30 x 0   Entered by:   [name],[name],[name]   Authorized by:   [name] MD   Signed by:   [name],[name],[name] on 12/07/2009   Method used:   Samples Given   RxID:   [id number] DIFLUCAN 200 MG  TABS (FLUCONAZOLE) Take 1 tablet by mouth once a day per Dr. Maurice March  #60 x 0   Entered by:   [name],[name],[name]   Authorized by:   [name] MD   Signed by:   [name],[name],[name] on 12/07/2009   Method used:   Samples Given   RxID:   [id number] ISENTRESS 400 MG  TABS (RALTEGRAVIR POTASSIUM) one taqb two times a day  #60 x 0   Entered by:   [name],[name],[name]   Authorized by:   [name] MD   Signed by:   [name],[name],[name] on 12/07/2009   Method used:   Samples Given   RxID:   [id number] TRUVADA 200-300 MG TABS (EMTRICITABINE-TENOFOVIR) Take 1 tablet by mouth once a day.  May split tablet in 1/2 in order to take  #30 x 0   Entered by:   [name],[name],[name]   Authorized by:   [name] MD   Signed by:   [name],[name],[name] on 12/07/2009   Method used:   Samples Given   RxID:   [id number] REYATAZ 200 MG CAPS (ATAZANAVIR SULFATE) Take 2 capsules by mouth once daily  #60 x 0   Entered by:   [name],[name],[name]   Authorized by:   [name] MD   Signed by:   [name],[name],[name] on 12/07/2009   Method used:   Samples Given   RxID:   [id number]  Patient Assist Medication Verification: Medication name: SMZ-TMP DS 800/160mg  RX # [id number] Tech approval:MLD  Patient Assist Medication Verification: Medication name:fluconazole 200mg  RX #  [id number] Tech approval:MLD   Patient Assist Medication Verification: Medication:Reyataz 200mg  Lot# [id number] Exp Date:Jan 2013 Tech  approval:MLD                Patient Assist Medication Verification: Medication:Isentress 400mg  Lot# [id number] Exp Date:05 2013 Tech approval:MLD                Patient Assist Medication Verification: Medication:Truvada Lot# [id number] Exp Date:10 2014 Tech approval:MLD Call placed to patient with message that assistance medications are ready for pick-up. [name],[name],[name]  Dec 07, 2009 11:23 AM

## 2010-08-27 NOTE — Progress Notes (Signed)
Summary: Decrease fluconazole to 1 tablet per day  Phone Note Other Incoming   Caller: [name], RN Summary of Call: Pt's CD4 count is now 150.  Would MD consider decreasing fluconazole to one dose/day rather than two?  This would decrease the pt's "daily pill load."  Please advise. [name] RN  August 14, 2009 4:19 PM Verbal order from Dr. Darlina Sicilian to decrease Fluconazole to once daily. [name] RN  August 15, 2009 12:48 PM      New/Updated Medications: FLUCONAZOLE 200 MG  TABS (FLUCONAZOLE) Take 1 tablet by mouth once a day per Dr. Maurice March DIFLUCAN 200 MG  TABS (FLUCONAZOLE) Take 1 tablet by mouth once a day per Dr. Maurice March

## 2010-08-27 NOTE — Miscellaneous (Signed)
  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

## 2010-08-27 NOTE — Progress Notes (Signed)
Summary: New outbreak shingles, requesting rx  Phone Note Other Incoming   Caller: [name], RN [facility], Treatment Adherence Prog. Summary of Call: Pt. call [name], RN.  Having outbreak of "Shingles."  Would pt. benefit from Valtrex rx?  Please advise. [name] RN  September 26, 2009 2:35 PM RN spoke with Dr. Philipp Deputy.  Will send phone note to Dr. Philipp Deputy for rx.  Then, send rx to [name] to order through [facility]. [name] RN  September 26, 2009 2:38 PM   Follow-up for Phone Call        valtrex 1gm q8 for 7 days Follow-up by: [name] MD,  September 26, 2009 2:46 PM  Additional Follow-up for Phone Call Additional follow up Details #1::        Phone call completed Additional Follow-up by: [name],[name],[name],  September 27, 2009 1:16 PM    New/Updated Medications: VALTREX 1 GM TABS (VALACYCLOVIR HCL) Take 1 tablet by mouth every 8 hours Prescriptions: VALTREX 1 GM TABS (VALACYCLOVIR HCL) Take 1 tablet by mouth every 8 hours  #21 x 0   Entered by:   [name],[name],[name]   Authorized by:   [name] MD   Signed by:   [name],[name],[name] on 09/27/2009   Method used:   Telephoned to ...       [facility]       [address]       [place], [place]  [address]       Ph: [phone]       Fax: [phone]   RxID:   [id number]  [name],[name],[name]  September 27, 2009 1:16 PM  Appended Document: Orders Update    Clinical Lists Changes  Medications: Added new medication of ROXICET 5-325 MG/5ML SOLN (OXYCODONE-ACETAMINOPHEN) take 5 to 10 ml every eight hours as needed for pain - Signed Rx of ROXICET 5-325 MG/5ML SOLN (OXYCODONE-ACETAMINOPHEN) take 5 to 10 ml every eight hours as needed for pain;  #500 x 0;  Signed;  Entered by: Acey Lav MD;  Authorized by: Paulette Blanch Dam MD;  Method used: Print then Give to Patient    Prescriptions: ROXICET 5-325 MG/5ML SOLN  (OXYCODONE-ACETAMINOPHEN) take 5 to 10 ml every eight hours as needed for pain  #500 x 0   Entered and Authorized by:   Acey Lav MD   Signed by:   Paulette Blanch Dam MD on 10/02/2009   Method used:   Print then Give to Patient   RxID:   [id number]    Appended Document: New outbreak shingles, requesting rx Patient's Valtrex finally came in.  Patient was called and said she will come and pick up on Friday 10/05/09  Patient Assist Medication Verification: Medication name: Valacyclovir 1 gram RX # [id number] Tech approval:MLD  Appended Document: New outbreak shingles, requesting rx Prescription/Samples picked up by: patient  the Valtrex.  She gave back the prescription for the Roxicet Soluntion because she did not want to fill it.  [name], CMA watched Veronica Robles destroy the prescription.

## 2010-08-27 NOTE — Miscellaneous (Signed)
  Clinical Lists Changes  Observations: Added new observation of YEARAIDSPOS: 2008  (06/19/2010 9:06) Added new observation of HIV STATUS: CDC-defined AIDS  (06/19/2010 9:06)

## 2010-08-27 NOTE — Miscellaneous (Signed)
Summary: HCP Continuous Care: Home Health Cert. & Plan Of Care  HCP Continuous Care: Home Health Cert. & Plan Of Care   Imported By: Florinda Marker 08/31/2009 16:52:32  _____________________________________________________________________  External Attachment:    Type:   Image     Comment:   External Document

## 2010-08-27 NOTE — Progress Notes (Signed)
Summary: [place] meds arrived for Feb  Phone Note Refill Request      Prescriptions: SEPTRA DS 800-160 MG  TABS (SULFAMETHOXAZOLE-TRIMETHOPRIM) Take 1 tablet by mouth once a day  #30 x 0   Entered by:   [name],[name],[name]   Authorized by:   [name] MD   Signed by:   [name],[name],[name] on 09/06/2009   Method used:   Samples Given   RxID:   [id number] DIFLUCAN 200 MG  TABS (FLUCONAZOLE) Take 1 tablet by mouth once a day per Dr. Maurice March  #60 x 0   Entered by:   [name],[name],[name]   Authorized by:   [name] MD   Signed by:   [name],[name],[name] on 09/06/2009   Method used:   Samples Given   RxID:   [id number] ISENTRESS 400 MG  TABS (RALTEGRAVIR POTASSIUM) one taqb two times a day  #60 x 0   Entered by:   [name],[name],[name]   Authorized by:   [name] MD   Signed by:   [name],[name],[name] on 09/06/2009   Method used:   Samples Given   RxID:   [id number] TRUVADA 200-300 MG TABS (EMTRICITABINE-TENOFOVIR) Take 1 tablet by mouth once a day.  May split tablet in 1/2 in order to take  #30 x 0   Entered by:   [name],[name],[name]   Authorized by:   [name] MD   Signed by:   [name],[name],[name] on 09/06/2009   Method used:   Samples Given   RxID:   [id number] REYATAZ 200 MG CAPS (ATAZANAVIR SULFATE) Take 2 capsules by mouth once daily  #60 x 0   Entered by:   [name],[name],[name]   Authorized by:   [name] MD   Signed by:   [name],[name],[name] on 09/06/2009   Method used:   Samples Given   RxID:   [id number]  **[facility] sent #60 tablets of Fluconazole.  Orignally when directions were change; no one was notified to change it with the pharmacy.  It has now been changed and since they sent #60, they will not resent until April if she is still on.  The directions have been fixed on her bottle and will be explained to patient at pick  up.**  Patient Assist Medication Verification: Medication name: Fluconazole 200mg  RX # [id number] Tech approval:MLD  Patient Assist Medication Verification: Medication name: SMZ-TMP DS 800/160mg  RX # [id number] Tech approval:MLD   Patient Assist Medication Verification: Medication: Isentress 400mg  SAY#[id number] Exp Date:02 2013 Tech approval:MLD                Patient Assist Medication Verification: Medication: Truvada Lot# [id number] Exp Date:08 2014 Tech approval:MLD                Patient Assist Medication Verification: Medication:Reyataz 200mg  Lot#[id number] Exp Date:Nov 2012 Tech approval:MLD Call placed to patient with message that assistance medications are ready for pick-up. Left message [name],[name],[name]  September 06, 2009 12:03 PM

## 2010-08-27 NOTE — Progress Notes (Signed)
Summary: [place]/[place] assist meds arrived for Jun via [facility]  Phone Note Refill Request      Prescriptions: SEPTRA DS 800-160 MG  TABS (SULFAMETHOXAZOLE-TRIMETHOPRIM) Take 1 tablet by mouth once a day  #30 x 0   Entered by:   [name],[name],[name]   Authorized by:   [name] MD   Signed by:   [name],[name],[name] on 01/03/2010   Method used:   Samples Given   RxID:   [id number] DIFLUCAN 200 MG  TABS (FLUCONAZOLE) Take 1 tablet by mouth once a day per Dr. Maurice March  #60 x 0   Entered by:   [name],[name],[name]   Authorized by:   [name] MD   Signed by:   [name],[name],[name] on 01/03/2010   Method used:   Samples Given   RxID:   [id number] ISENTRESS 400 MG  TABS (RALTEGRAVIR POTASSIUM) one taqb two times a day  #60 x 0   Entered by:   [name],[name],[name]   Authorized by:   [name] MD   Signed by:   [name],[name],[name] on 01/03/2010   Method used:   Samples Given   RxID:   [id number] TRUVADA 200-300 MG TABS (EMTRICITABINE-TENOFOVIR) Take 1 tablet by mouth once a day.  May split tablet in 1/2 in order to take  #30 x 0   Entered by:   [name],[name],[name]   Authorized by:   [name] MD   Signed by:   [name],[name],[name] on 01/03/2010   Method used:   Samples Given   RxID:   [id number] REYATAZ 200 MG CAPS (ATAZANAVIR SULFATE) Take 2 capsules by mouth once daily  #60 x 0   Entered by:   [name],[name],[name]   Authorized by:   [name] MD   Signed by:   [name],[name],[name] on 01/03/2010   Method used:   Samples Given   RxID:   [id number]  Patient Assist Medication Verification: Medication name: Isentress 400mg  RX #  [id number] Tech approval:MLD  Patient Assist Medication Verification: Medication name: Fluconazole 200mg  RX # [id number] Tech approval:MLD  Patient Assist Medication Verification: Medication name:Reyataz 200mg  RX # [id number] Tech  approval:MLD  Patient Assist Medication Verification: Medication name:Truvada RX # [id number] Tech approval:MLD  Patient Assist Medication Verification: Medication name:Sulfameth/Trimethoprim 800/160mg  RX # [id number] Tech approval:MLD Call placed to patient with message that assistance medications are ready for pick-up. Patient is meeting on Tomorrow, Friday 01/04/10 with the treatment adherent nurse, Leonor Liv.  On that day she will pick up her medication. [name],[name],[name]  January 03, 2010 2:24 PM

## 2010-08-27 NOTE — Progress Notes (Signed)
Summary: [facility] meds arrived for sept-pt aware  Phone Note Refill Request      Prescriptions: SEPTRA DS 800-160 MG  TABS (SULFAMETHOXAZOLE-TRIMETHOPRIM) Take 1 tablet by mouth once a day  #30 x 0   Entered by:   [name],[name],[name]   Authorized by:   [name] MD   Signed by:   [name],[name],[name] on 04/22/2010   Method used:   Samples Given   RxID:   [id number] DIFLUCAN 200 MG  TABS (FLUCONAZOLE) Take 1 tablet by mouth once a day per Dr. Maurice March  #60 x 0   Entered by:   [name],[name],[name]   Authorized by:   [name] MD   Signed by:   [name],[name],[name] on 04/22/2010   Method used:   Samples Given   RxID:   [id number] ISENTRESS 400 MG  TABS (RALTEGRAVIR POTASSIUM) one taqb two times a day  #60 x 0   Entered by:   [name],[name],[name]   Authorized by:   [name] MD   Signed by:   [name],[name],[name] on 04/22/2010   Method used:   Samples Given   RxID:   [id number] TRUVADA 200-300 MG TABS (EMTRICITABINE-TENOFOVIR) Take 1 tablet by mouth once a day.  May split tablet in 1/2 in order to take  #30 x 0   Entered by:   [name],[name],[name]   Authorized by:   [name] MD   Signed by:   [name],[name],[name] on 04/22/2010   Method used:   Samples Given   RxID:   [id number] REYATAZ 200 MG CAPS (ATAZANAVIR SULFATE) Take 2 capsules by mouth once daily  #60 x 0   Entered by:   [name],[name],[name]   Authorized by:   [name] MD   Signed by:   [name],[name],[name] on 04/22/2010   Method used:   Samples Given   RxID:   [id number]  Patient Assist Medication Verification: Medication name:sulfameth/trimethroprim 800/160mg  RX # [id number] Tech approval:mld  Patient Assist Medication Verification: Medication name:Isentress 400mg  RX # [id number] Tech approval:mld  Patient Assist Medication Verification: Medication name:fluconazole 200mg  RX # [id number] Tech  approval:mld  Patient Assist Medication Verification: Medication name:truvada RX # [id number] Tech approval:mld  Patient Assist Medication Verification: Medication name:reyataz 200mg  RX # [id number] Tech approval:mld Call placed to patient with message that assistance medications are ready for pick-up. Patient said she will pick up during her Thursday appt [name],[name],[name]  April 22, 2010 12:26 PM

## 2010-08-27 NOTE — Assessment & Plan Note (Signed)
Summary: 5 month f/u [mkj]   Primary Provider:  Drue Dun MD  CC:  5 month follow up.  History of Present Illness: Veronica Robles. She now is takingher ARVs faithflly and her HIVis <20copies/ml andCD4 counti is slowly risingand now 170/mm3. Shehas gained about 7 lbs andhas no complaints. She works Personnel officer, a [employer]. Her sisters know of her HIV but not her son.  Preventive Screening-Counseling & Management  Alcohol-Tobacco     Alcohol drinks/day: 0     Smoking Status: quit     Passive Smoke Exposure: no  Caffeine-Diet-Exercise     Caffeine use/day: coffee,tea occassionally     Does Patient Exercise: yes     Type of exercise: up and walking at work     Exercise (avg: min/session): >60     Times/week: 3  Safety-Violence-Falls     Seat Belt Use: yes   Current Allergies (reviewed today): ! PENICILLIN G POTASSIUM (PENICILLIN G POTASSIUM) PENICILLIN V POTASSIUM (PENICILLIN V POTASSIUM) Vital Signs:  Patient profile:   55 year old female Menstrual status:  hysterectomy, 1978 Height:      63 inches (160.02 cm) Weight:      118.8 pounds (54.00 kg) BMI:     21.12 Temp:     98.4 degrees F (36.89 degrees C) oral BP sitting:   161 / 84  (right arm)  Vitals Entered By: Baxter Hire) (April 25, 2010 3:40 PM) CC: 5 month follow up Pain Assessment Patient in pain? no      Nutritional Status BMI of 19 -24 = normal Nutritional Status Detail appetite is good per patient  Have you ever been in a relationship where you felt threatened, hurt or afraid?No   Does patient need assistance? Functional Status Self care Ambulation Normal   Physical Exam  General:  alert, well-developed, well-nourished, and well-hydrated.   Mouth:  good dentition, no gingival abnormalities, and pharynx pink and moist.   Lungs:  normal respiratory effort and normal breath sounds.   Heart:  normal rate, regular rhythm, and no murmur.     Impression &  Recommendations:  Problem # 1:  AIDS (ICD-042)  Her updated medication list for this problem includes:    Bactrim Ds 800-160 Mg Tabs (Sulfamethoxazole-trimethoprim) .Marland Kitchen... Take 1 tablet by mouth three times a week    Fluconazole 200 Mg Tabs (Fluconazole) .Marland Kitchen... Take 1 tablet by mouth once a day per dr. Amritpal Shropshire    Septra Ds 800-160 Mg Tabs (Sulfamethoxazole-trimethoprim) .Marland Kitchen... Take 1 tablet by mouth once a day    Diflucan 200 Mg Tabs (Fluconazole) .Marland Kitchen... Take 1 tablet by mouth once a day per dr. Hillery Zachman  Future Orders: T-CBC w/Diff ([id number]) ... 08/26/2010 T-CD4SP ([facility]) (CD4SP) ... 08/26/2010 T-Comprehensive Metabolic Panel [id number]) ... 08/26/2010 T-HIV Viral Load [id number]) ... 08/26/2010  Other Orders: Influenza Vaccine NON MCR ([id number])  Patient Instructions: 1)  Please schedule a follow-up appointment in 4 months. 2)  Be sure to return for lab work one (1) week before your next appointment as scheduled.   Influenza Vaccine    Vaccine Type: Fluvax Non-MCR    Site: right deltoid    Mfr: Novartis    Dose: 0.5 ml    Route: IM    Given by: Kathi Simpers CMA(AAMA)    Exp. Date: 10/27/2010    Lot #: [id number]    VIS given: 02/19/10 version given April 25, 2010.  Flu Vaccine Consent Questions    Do you have a history of  severe allergic reactions to this vaccine? no    Any prior history of allergic reactions to egg and/or gelatin? no    Do you have a sensitivity to the preservative Thimersol? no    Do you have a past history of Guillan-Barre Syndrome? no    Do you currently have an acute febrile illness? no    Have you ever had a severe reaction to latex? no    Vaccine information given and explained to patient? yes    Are you currently pregnant? no

## 2010-08-27 NOTE — Miscellaneous (Signed)
Summary: Orders Update  Clinical Lists Changes  Orders: Added new Test order of T-Chlamydia  Probe, urine [phone]) - Signed     Process Orders Check Orders Results:     [facility]: ABN not required for this insurance Tests Sent for requisitioning (August 16, 2009 9:22 AM):     08/08/2009: [facility] -- [employer], urine [phone] (signed)

## 2010-08-27 NOTE — Progress Notes (Signed)
Summary: [place]/pt assist meds arrived for Apr  Phone Note Refill Request      Prescriptions: SEPTRA DS 800-160 MG  TABS (SULFAMETHOXAZOLE-TRIMETHOPRIM) Take 1 tablet by mouth once a day  #30 x 0   Entered by:   [name],[name],[name]   Authorized by:   [name] MD   Signed by:   [name],[name],[name] on 11/08/2009   Method used:   Samples Given   RxID:   [id number] DIFLUCAN 200 MG  TABS (FLUCONAZOLE) Take 1 tablet by mouth once a day per Dr. Maurice March  #60 x 0   Entered by:   [name],[name],[name]   Authorized by:   [name] MD   Signed by:   [name],[name],[name] on 11/08/2009   Method used:   Samples Given   RxID:   [id number] ISENTRESS 400 MG  TABS (RALTEGRAVIR POTASSIUM) one taqb two times a day  #60 x 0   Entered by:   [name],[name],[name]   Authorized by:   [name] MD   Signed by:   [name],[name],[name] on 11/08/2009   Method used:   Samples Given   RxID:   [id number] TRUVADA 200-300 MG TABS (EMTRICITABINE-TENOFOVIR) Take 1 tablet by mouth once a day.  May split tablet in 1/2 in order to take  #30 x 0   Entered by:   [name],[name],[name]   Authorized by:   [name] MD   Signed by:   [name],[name],[name] on 11/08/2009   Method used:   Samples Given   RxID:   [id number]  Patient Assist Medication Verification: Medication name: SMZ-TMP DS 800/160mg  RX # [id number] Tech approval:MLD  Patient Assist Medication Verification: Medication name:Fluconazole 200mg  RX # [id number] Tech approval:MLD   Patient Assist Medication Verification: Medication:Reyataz 200mg  Lot#[id number] Exp Date:Nov 2012 Tech approval:MLD                Patient Assist Medication Verification: Medication:Isentress 400mg  [id number] Exp Date:08 2012 Tech approval:MLD                Patient Assist Medication Verification: Medication:truvada Lot# [id number] Exp Date:09 2014 Tech approval:MLD Call placed to  patient with message that assistance medications are ready for pick-up. [name],[name],[name]  November 08, 2009 3:13 PM

## 2010-08-27 NOTE — Progress Notes (Signed)
Summary: [place]/pt assist meds arrived(partial) for Aug  Phone Note Refill Request      Prescriptions: ISENTRESS 400 MG  TABS (RALTEGRAVIR POTASSIUM) one taqb two times a day  #60 x 0   Entered by:   [name],[name],[name]   Authorized by:   [name] MD   Signed by:   [name],[name],[name] on 02/27/2010   Method used:   Samples Given   RxID:   [id number] TRUVADA 200-300 MG TABS (EMTRICITABINE-TENOFOVIR) Take 1 tablet by mouth once a day.  May split tablet in 1/2 in order to take  #30 x 0   Entered by:   [name],[name],[name]   Authorized by:   [name] MD   Signed by:   [name],[name],[name] on 02/27/2010   Method used:   Samples Given   RxID:   [id number] REYATAZ 200 MG CAPS (ATAZANAVIR SULFATE) Take 2 capsules by mouth once daily  #60 x 0   Entered by:   [name],[name],[name]   Authorized by:   [name] MD   Signed by:   [name],[name],[name] on 02/27/2010   Method used:   Samples Given   RxID:   [id number]  Patient Assist Medication Verification: Medication name: Reyataz 200mg  RX # [id number] Tech approval:MLD  Patient Assist Medication Verification: Medication name:Truvada RX # [id number] Tech approval:MLD  Patient Assist Medication Verification: Medication name:Isentress 400mg  RX # [id number] Tech approval:MLG  Two of her other medications are due to be shipped tomorrow. I believe the two she is missing was waiting for additional refills to be sent in.  Done on 02/26/10 Call placed to patient with message that assistance medications are ready for pick-up. Left a message on patient's voicemail.  Appended Document: [place]/pt assist meds arrived(partial) for Aug patient other two medications arrived today.   Clinical Lists Changes  Medications: Rx of SEPTRA DS 800-160 MG  TABS (SULFAMETHOXAZOLE-TRIMETHOPRIM) Take 1 tablet by mouth once a day;  #30 x 0;  Signed;  Entered by: [name],CPht  II,[name];  Authorized by: [name] MD;  Method used: Samples Given Rx of DIFLUCAN 200 MG  TABS (FLUCONAZOLE) Take 1 tablet by mouth once a day per Dr. Maurice March;  #60 x 0;  Signed;  Entered by: [name],[name],[name];  Authorized by: [name] MD;  Method used: Samples Given    Prescriptions: DIFLUCAN 200 MG  TABS (FLUCONAZOLE) Take 1 tablet by mouth once a day per Dr. Maurice March  #60 x 0   Entered by:   [name],[name],[name]   Authorized by:   [name] MD   Signed by:   [name],[name],[name] on 02/28/2010   Method used:   Samples Given   RxID:   [id number] SEPTRA DS 800-160 MG  TABS (SULFAMETHOXAZOLE-TRIMETHOPRIM) Take 1 tablet by mouth once a day  #30 x 0   Entered by:   [name],[name],[name]   Authorized by:   [name] MD   Signed by:   [name],[name],[name] on 02/28/2010   Method used:   Samples Given   RxID:   [id number]  Patient Assist Medication Verification: Medication name: Sulfameth/trimethoprim 800/160mg  RX # [id number] Tech approval:MLD  Patient Assist Medication Verification: Medication name:Fluconazole 200mg  RX # [id number] Tech approval:MLD  A message has already been left for patient on Wednesday, 02/27/10 [name],CPht  II,[name]  February 28, 2010 3:47 PM

## 2010-08-27 NOTE — Progress Notes (Signed)
Summary: [facility]/pt assist meds arrived for Jan  Phone Note Refill Request      Prescriptions: SEPTRA DS 800-160 MG  TABS (SULFAMETHOXAZOLE-TRIMETHOPRIM) Take 1 tablet by mouth once a day  #30 x 0   Entered by:   [name],[name],[name]   Authorized by:   [name] MD   Signed by:   [name],[name],[name] on 08/13/2009   Method used:   Samples Given   RxID:   [id number] DIFLUCAN 200 MG  TABS (FLUCONAZOLE) take two tablets by mouth once daily  #60 x 0   Entered by:   [name],[name],[name]   Authorized by:   [name] MD   Signed by:   [name],[name],[name] on 08/13/2009   Method used:   Samples Given   RxID:   [id number] ISENTRESS 400 MG  TABS (RALTEGRAVIR POTASSIUM) one taqb two times a day  #60 x 0   Entered by:   [name],[name],[name]   Authorized by:   [name] MD   Signed by:   [name],[name],[name] on 08/13/2009   Method used:   Samples Given   RxID:   [id number] TRUVADA 200-300 MG TABS (EMTRICITABINE-TENOFOVIR) Take 1 tablet by mouth once a day.  May split tablet in 1/2 in order to take  #30 x 0   Entered by:   [name],[name],[name]   Authorized by:   [name] MD   Signed by:   [name],[name],[name] on 08/13/2009   Method used:   Samples Given   RxID:   [id number] REYATAZ 200 MG CAPS (ATAZANAVIR SULFATE) Take 2 capsules by mouth once daily  #60 x 0   Entered by:   [name],[name],[name]   Authorized by:   [name] MD   Signed by:   [name],[name],[name] on 08/13/2009   Method used:   Samples Given   RxID:   [id number]  Patient Assist Medication Verification: Medication name: SMZ-TMP DS 800/160mg  RX # [id number] Tech approval:MLD  Patient Assist Medication Verification: Medication name:Fluconazole 200mg  RX # [id number] Tech approval:MLD   Patient Assist Medication Verification: Medication:Truvada Lot# [id number] Exp Date:06 2014 Tech approval:MLD         Patient Assist Medication Verification: Medication:Isentress 400mg  ENI#[id number] Exp Date:12 2012 Tech approval:MLD                Patient Assist Medication Verification: Medication:Reyataz 200mg  Lot#[id number] Exp Date:Aug 2012 Tech approval:MLD Call placed to patient with message that assistance medications are ready for pick-up. [name],[name],[name]  August 13, 2009 10:28 AM                   Appended Document: [facility]/pt assist meds arrived for [facility] nurse, [name] picked up while patient was in office to see him for Medical Case management. adherence

## 2010-08-27 NOTE — Progress Notes (Signed)
Summary: ncadap meds arrived for Aug  Phone Note Refill Request      Prescriptions: SEPTRA DS 800-160 MG  TABS (SULFAMETHOXAZOLE-TRIMETHOPRIM) Take 1 tablet by mouth once a day  #30 x 0   Entered by:   [name],[name],[name]   Authorized by:   [name] MD   Signed by:   [name],[name],[name] on 03/22/2010   Method used:   Samples Given   RxID:   [id number] DIFLUCAN 200 MG  TABS (FLUCONAZOLE) Take 1 tablet by mouth once a day per Dr. Maurice March  #60 x 0   Entered by:   [name],[name],[name]   Authorized by:   [name] MD   Signed by:   [name],[name],[name] on 03/22/2010   Method used:   Samples Given   RxID:   [id number] ISENTRESS 400 MG  TABS (RALTEGRAVIR POTASSIUM) one taqb two times a day  #60 x 0   Entered by:   [name],[name],[name]   Authorized by:   [name] MD   Signed by:   [name],[name],[name] on 03/22/2010   Method used:   Samples Given   RxID:   [id number] TRUVADA 200-300 MG TABS (EMTRICITABINE-TENOFOVIR) Take 1 tablet by mouth once a day.  May split tablet in 1/2 in order to take  #30 x 0   Entered by:   [name],[name],[name]   Authorized by:   [name] MD   Signed by:   [name],[name],[name] on 03/22/2010   Method used:   Samples Given   RxID:   [id number] REYATAZ 200 MG CAPS (ATAZANAVIR SULFATE) Take 2 capsules by mouth once daily  #60 x 0   Entered by:   [name],[name],[name]   Authorized by:   [name] MD   Signed by:   [name],[name],[name] on 03/22/2010   Method used:   Samples Given   RxID:   [id number]  Patient Assist Medication Verification: Medication name: Sulfameth/trimethoprim 800/160mg  RX # [id number] Tech approval:MLD  Patient Assist Medication Verification: Medication name:Reyataz 200mg  RX # [id number] Tech approval:MLD  Patient Assist Medication Verification: Medication name:Isentress 400mg  RX # [id number] Tech  approval:MLD  Patient Assist Medication Verification: Medication name:Fluconazole 200mg  RX # [id number] Tech approval:MLD  Patient Assist Medication Verification: Medication name: Berenda Morale RX # [id number] Tech approval:MLD Call placed to patient with message that assistance medications are ready for pick-up. Patient said she will pick up today around 3pm when she meets with the home care nurse [facility]. [name],[name],[name]  March 22, 2010 12:06 PM

## 2010-08-27 NOTE — Progress Notes (Signed)
Summary: [facility]/pt assist meds arrived for Jul  Phone Note Refill Request      Prescriptions: SEPTRA DS 800-160 MG  TABS (SULFAMETHOXAZOLE-TRIMETHOPRIM) Take 1 tablet by mouth once a day  #30 x 0   Entered by:   [name],[name],[name]   Authorized by:   [name] MD   Signed by:   [name],[name],[name] on 01/29/2010   Method used:   Samples Given   RxID:   [id number] DIFLUCAN 200 MG  TABS (FLUCONAZOLE) Take 1 tablet by mouth once a day per Dr. Maurice March  #60 x 0   Entered by:   [name],[name],[name]   Authorized by:   [name] MD   Signed by:   [name],[name],[name] on 01/29/2010   Method used:   Samples Given   RxID:   [id number] ISENTRESS 400 MG  TABS (RALTEGRAVIR POTASSIUM) one taqb two times a day  #60 x 0   Entered by:   [name],[name],[name]   Authorized by:   [name] MD   Signed by:   [name],[name],[name] on 01/29/2010   Method used:   Samples Given   RxID:   [id number] TRUVADA 200-300 MG TABS (EMTRICITABINE-TENOFOVIR) Take 1 tablet by mouth once a day.  May split tablet in 1/2 in order to take  #30 x 0   Entered by:   [name],[name],[name]   Authorized by:   [name] MD   Signed by:   [name],[name],[name] on 01/29/2010   Method used:   Samples Given   RxID:   [id number] REYATAZ 200 MG CAPS (ATAZANAVIR SULFATE) Take 2 capsules by mouth once daily  #60 x 0   Entered by:   [name],[name],[name]   Authorized by:   [name] MD   Signed by:   [name],[name],[name] on 01/29/2010   Method used:   Samples Given   RxID:   [id number]  Patient Assist Medication Verification: Medication name: Fluconazole 200mg  RX # [id number] Tech approval:MLD  Patient Assist Medication Verification: Medication name:Isentress 400mg  RX # [id number] Tech approval:MLD  Patient Assist Medication Verification: Medication name:Truvada RX # [id number] Tech approval:MLD  Patient Assist  Medication Verification: Medication name:Reyataz 200mg  RX # [id number] Tech approval:MLD  Patient Assist Medication Verification: Medication name: Sulfathmeth/Trimthoprim 800/160mg  RX # [id number] Tech approval:MLD Call placed to patient with message that assistance medications are ready for pick-up. Left message for patient to call office. [name],[name],[name]  January 29, 2010 2:05 PM

## 2010-08-27 NOTE — Assessment & Plan Note (Signed)
Summary: 44month recheck/ch   Primary Provider:  Drue Dun MD  CC:  4 month follow up.  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 She continues on her specially disigned regimen of Truvada, raltegravir, and atazanavir. She continues on fluconazole and TMP-SMX. 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  Preventive Screening-Counseling & Management  Alcohol-Tobacco     Alcohol drinks/day: 0     Smoking Status: quit     Passive Smoke Exposure: no  Caffeine-Diet-Exercise     Caffeine use/day: coffee,tea occassionally     Does Patient Exercise: yes     Type of exercise: up and walking at work     Exercise (avg: min/session): >60     Times/week: 3  Safety-Violence-Falls     Seat Belt Use: yes   Current Allergies (reviewed today): ! PENICILLIN G POTASSIUM (PENICILLIN G POTASSIUM) PENICILLIN V POTASSIUM (PENICILLIN V POTASSIUM) Vital Signs:  Patient profile:   55 year old female Menstrual status:  hysterectomy, 1978 Height:      63 inches (160.02 cm) Weight:      110.8 pounds (50.36 kg) BMI:     19.70 Temp:     97.8 degrees F (36.56 degrees C) oral Pulse rate:   65 / minute BP sitting:   120 / 77  (left arm)  Vitals Entered By: Baxter Hire) (Dec 20, 2009 10:36 AM) CC: 4 month follow up Pain Assessment Patient in pain? no      Nutritional Status BMI of 19 -24 = normal Nutritional Status Detail appetite is good per patient  Have you ever been in a relationship where you felt threatened, hurt or afraid?No   Does patient need assistance? Functional Status Self care Ambulation Normal   Physical Exam  General:  Well-developed,well-nourished,in no acute distress; alert,appropriate and cooperative throughout examination Mouth:  Oral mucosa and oropharynx without lesions or exudates.  Teeth in good repair. Neck:  supple, full ROM, and no  masses.   Lungs:  Normal respiratory effort, chest expands symmetrically. Lungs are clear to auscultation, no crackles or wheezes. Heart:  no murmur.   Neurologic:  No cranial nerve deficits noted. Station and gait are normal. Plantar reflexes are down-going bilaterally. DTRs are symmetrical throughout. Sensory, motor and coordinative functions appear intact.   Impression & Recommendations:  Problem # 1:  AIDS (ICD-042)  Her updated medication list for this problem includes:    Bactrim Ds 800-160 Mg Tabs (Sulfamethoxazole-trimethoprim) .Marland Kitchen... Take 1 tablet by mouth three times a week    Fluconazole 200 Mg Tabs (Fluconazole) .Marland Kitchen... Take 1 tablet by mouth once a day per dr. Kellen Hover    Septra Ds 800-160 Mg Tabs (Sulfamethoxazole-trimethoprim) .Marland Kitchen... Take 1 tablet by mouth once a day    Diflucan 200 Mg Tabs (Fluconazole) .Marland Kitchen... Take 1 tablet by mouth once a day per dr. Calloway Andrus Nowvirally suppressed and will continue present arvs and prophylaxisl  Orders: Est. Patient Level IV (95638)

## 2010-08-27 NOTE — Miscellaneous (Signed)
Summary: order   Process Orders Check Orders Results:     [facility]: ABN not required for this insurance Tests Sent for requisitioning (Dec 20, 2009 10:18 AM):     04/15/2010: [facility] -- T-HIV Viral Load [phone] (signed)     04/15/2010: [facility] -- T-CBC w/Diff [[id number]] (signed)     04/15/2010: [facility] -- T-Comprehensive Metabolic Panel [phone] (signed)

## 2010-08-27 NOTE — Miscellaneous (Signed)
Summary: [facility] FINANCIAL UPDATE   Clinical Lists Changes  Observations: Added new observation of AIDSDAP: Yes 2011 (06/19/2010 12:48) Added new observation of PCTFPL: 45.48  (06/19/2010 12:48) Added new observation of HOUSEINCOME: 4926  (06/19/2010 12:48) Added new observation of FINASSESSDT: 05/01/2010  (06/19/2010 12:48)
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
## 2010-08-28 ENCOUNTER — Ambulatory Visit: Admit: 2010-08-28 | Payer: Self-pay | Admitting: Infectious Diseases

## 2010-08-28 ENCOUNTER — Other Ambulatory Visit: Payer: Self-pay

## 2010-08-28 LAB — T-HELPER CELL (CD4) - (RCID CLINIC ONLY)
CD4 % Helper T Cell: 17 % — ABNORMAL LOW (ref 33–55)
CD4 T Cell Abs: 370 uL — ABNORMAL LOW (ref 400–2700)

## 2010-09-12 ENCOUNTER — Ambulatory Visit (INDEPENDENT_AMBULATORY_CARE_PROVIDER_SITE_OTHER): Payer: Self-pay | Admitting: Infectious Diseases

## 2010-09-12 ENCOUNTER — Encounter: Payer: Self-pay | Admitting: Infectious Diseases

## 2010-09-12 DIAGNOSIS — Z23 Encounter for immunization: Secondary | ICD-10-CM

## 2010-09-12 DIAGNOSIS — B20 Human immunodeficiency virus [HIV] disease: Secondary | ICD-10-CM

## 2010-09-18 NOTE — Assessment & Plan Note (Signed)
Summary: F/U   Vital Signs:  Patient profile:   55 year old female Menstrual status:  hysterectomy, 1978 Height:      63 inches (160.02 cm) Weight:      122.12 pounds (55.51 kg) BMI:     21.71 Temp:     98.0 degrees F (36.67 degrees C) oral Pulse rate:   71 / minute BP sitting:   164 / 99  (right arm)  Vitals Entered By: Wendall Mola CMA Duncan Dull) (September 12, 2010 9:55 AM) CC: follow-up visit, lab results, B/P elevated Is Patient Diabetic? No Pain Assessment Patient in pain? no      Nutritional Status BMI of 19 -24 = normal Nutritional Status Detail appetite "good"  Have you ever been in a relationship where you felt threatened, hurt or afraid?No   Does patient need assistance? Functional Status Self care Ambulation Normal Comments pt. states she misses on average one dose a month of her HAART meds   Primary Provider:  Drue Dun MD  CC:  follow-up visit, lab results, and B/P elevated.  History of Present Illness: Veronica Robles isdoingspectacularly well on IsentressBID and Truvada with normallabs. Vaccines updated and will see again in4-53months.Will consider changing ARVregimen at next visit to Complera forsingle daily pill.RCT 4-62months.  Preventive Screening-Counseling & Management  Alcohol-Tobacco     Alcohol drinks/day: 0     Smoking Status: quit     Passive Smoke Exposure: no  Caffeine-Diet-Exercise     Caffeine use/day: coffee,tea occassionally     Does Patient Exercise: yes     Type of exercise: up and walking at work     Exercise (avg: min/session): >60     Times/week: 3  Hep-HIV-STD-Contraception     HIV Risk: no risk noted  Safety-Violence-Falls     Seat Belt Use: yes      Sexual History:  n/a.        Drug Use:  never.    Comments: pt. declined condoms  Allergies: 1)  ! Penicillin G Potassium (Penicillin G Potassium) 2)  Penicillin V Potassium (Penicillin V Potassium)  Physical Exam  General:  Well-developed,well-nourished,in no  acute distress; alert,appropriate and cooperative throughout examination Head:  Normocephalic and atraumatic without obvious abnormalities. No apparent alopecia or balding. Mouth:  Oral mucosa and oropharynx without lesions or exudates.  Teeth in good repair. Neck:  No deformities, masses, or tenderness noted. Lungs:  Normal respiratory effort, chest expands symmetrically. Lungs are clear to auscultation, no crackles or wheezes.   Impression & Recommendations:  Problem # 1:  AIDS (ICD-042)  The following medications were removed from the medication list:    Bactrim Ds 800-160 Mg Tabs (Sulfamethoxazole-trimethoprim) .Marland Kitchen... Take 1 tablet by mouth three times a week    Fluconazole 200 Mg Tabs (Fluconazole) .Marland Kitchen... Take 1 tablet by mouth once a day per dr. Vieva Brummitt    Septra Ds 800-160 Mg Tabs (Sulfamethoxazole-trimethoprim) .Marland Kitchen... Take 1 tablet by mouth once a day    Diflucan 200 Mg Tabs (Fluconazole) .Marland Kitchen... Take 1 tablet by mouth once a day per dr. Harumi Yamin  above  [place]  been D/C'd Orders: T-CBC w/Diff [phone]) T-CD4SP [facility]) (CD4SP) T-Comprehensive Metabolic Panel [phone]) T-HIV Viral Load [phone]) Est. Patient Level IV (62952)  Other Orders: Pneumococcal Vaccine (84132) Admin 1st Vaccine (44010)  Patient Instructions: 1)  Please schedule a follow-up appointment in 4-6 months. 2)  Be sure to return for lab work one (1) week before your next appointment as scheduled.   Orders Added: 1)  Pneumococcal Vaccine [90732] 2)  Admin 1st Vaccine [90471] 3)  T-CBC w/Diff [[id number]] 4)  T-CD4SP ([facility]) [CD4SP] 5)  T-Comprehensive Metabolic Panel [[id number]] 6)  T-HIV Viral Load [phone] 7)  Est. Patient Level IV [29562]   Pneumonia Vaccine (to be given today)   Pneumonia Vaccine (to be given today)

## 2010-09-18 NOTE — Miscellaneous (Signed)
Summary: HCP: Home Health Cert. & Plan Of Care  HCP: Home Health Cert. & Plan Of Care   Imported By: Florinda Marker 09/09/2010 18:40:18  _____________________________________________________________________  External Attachment:    Type:   Image     Comment:   External Document

## 2010-10-07 ENCOUNTER — Encounter (INDEPENDENT_AMBULATORY_CARE_PROVIDER_SITE_OTHER): Payer: Self-pay | Admitting: *Deleted

## 2010-10-15 LAB — T-HELPER CELL (CD4) - (RCID CLINIC ONLY): CD4 % Helper T Cell: 10 % — ABNORMAL LOW (ref 33–55)

## 2010-10-15 NOTE — Miscellaneous (Signed)
Summary: adap update   Clinical Lists Changes  Observations: Added new observation of PCTFPL: 44.07  (10/07/2010 9:40) Added new observation of HOUSEINCOME: 4773  (10/07/2010 9:40) Added new observation of FINASSESSDT: 10/07/2010  (10/07/2010 9:40) Added new observation of AIDSDAP: Pending-approval 2012  (10/07/2010 9:40)

## 2010-10-21 ENCOUNTER — Encounter (INDEPENDENT_AMBULATORY_CARE_PROVIDER_SITE_OTHER): Payer: Self-pay | Admitting: *Deleted

## 2010-10-29 NOTE — Miscellaneous (Signed)
  Clinical Lists Changes  Observations: Added new observation of AIDSDAP: Yes 2012 (10/21/2010 13:35)

## 2010-10-30 LAB — T-HELPER CELL (CD4) - (RCID CLINIC ONLY)
CD4 % Helper T Cell: 3 % — ABNORMAL LOW (ref 33–55)
CD4 T Cell Abs: 20 uL — ABNORMAL LOW (ref 400–2700)

## 2010-11-06 LAB — T-HELPER CELL (CD4) - (RCID CLINIC ONLY)
CD4 % Helper T Cell: 6 % — ABNORMAL LOW (ref 33–55)
CD4 T Cell Abs: 30 uL — ABNORMAL LOW (ref 400–2700)

## 2010-12-10 NOTE — Discharge Summary (Signed)
Veronica Robles, Veronica Robles              ACCOUNT NO.:  [id number]   MEDICAL RECORD NO.:  [id number]          PATIENT TYPE:  INP   LOCATION:  5704                         FACILITY:  [facility]   PHYSICIAN:  Ardeen Garland, MD       DATE OF BIRTH:  03-31-1956   DATE OF ADMISSION:  01/26/2007  DATE OF DISCHARGE:  02/04/2007                               DISCHARGE SUMMARY   PRIMARY CARE PHYSICIAN:  Dr. Drue Dun at the [facility].   CONSULTATIONS:  Dr. Maurice March of infectious disease.   PROCEDURES:  The patient did have a lumbar puncture performed upon  admission.   REASON FOR ADMISSION:  She was admitted because she has known HIV, and  she came complaining of a headache with photophobia and nausea, vomiting  and neck pain for over a week.  Her HIV was previously untreated.   DISCHARGE DIAGNOSES:  1. Cryptococcal meningitis.  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  3. Hypertension.  4. Hypokalemia.   DISCHARGE MEDICATIONS:  1. Fluconazole 400 mg by mouth once daily.  2. Bactrim 800 mg by mouth on Monday, Wednesday, Friday.  3. Azithromycin 1200 mg by mouth once a week.  4. K-Dur 40 mEq by mouth daily.   HOSPITAL COURSE:  As stated, she is a 55 year old woman with known HIV,  admitted for headache, photophobia, nausea, vomiting and neck pain.  After a lumbar puncture, she was found to have cryptococcal meningitis.  She was initially started on Diflucan IV 800 mg x1, and then 400 mg  daily.  However, the next day she was seen by Dr. Maurice March of infectious  disease, who changed her to Abelcet 235 mg IV daily, and flucytosine  1250 mg p.o. t.i.d.  This regimen was began on January 27, 2007, and she  continued on this through the day of her discharge on February 04, 2007.  She tolerated the treatment well.  She was predosed with Tylenol and  diphenhydramine for chills and comfort.  She did become hypokalemic on  the medicine, and therefore,  we did start her on K-Dur 40 mEq b.i.d.  throughout the course of her treatment in the hospital.   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  She did agree to see Dr.  Maurice March and follow up in his HIV clinic in the internal medicine clinics  and was willing to try HAART.  It was decided that it would not be  started until after her discharge and at her followup appointment with  Dr. Maurice March.  Previous to her admission, she had supposed to have been on  MAC and PCP prophylaxis with Bactrim and azithromycin; however, she had  not been taking them.  We did restart them here in the hospital.  She  did have a history of hypertension and we continued her home meds, and  she was stable throughout  her hospital stay.  All in all, she was stable  throughout her entire stay in the hospital.  She was kept inpatient due  to the IV amphotericin she was receiving.  She received 8 days of IV  amphotericin and p.o. flucytosine, at which time she was switched over  to fluconazole 400 mg by mouth once daily.  She will take this regimen  for 6 weeks, and then be reduced down to 200 mg by mouth until her CD4  counts rise.  This will all be controlled or decided by the infectious  disease doctors at the HIV clinic.   The patient was stable at the time of discharge.  She was setup with a  followup appointment with Dr. Maurice March at the internal medicine clinic on  Tuesday, August 5 at 11:20 a.m. and Dr. Deirdre Peer of the [facility] on July 28 at 10:30 a.m.  We also setup for her to have  labs drawn on Wednesday, July 16 to check a CBC and BMP to make sure  that she had maintained her potassium levels and that she was not  becoming pancytopenic.   DISPOSITION:  She was discharged to home.   FOLLOWUP:  Will be as described above.   FOLLOWUP ISSUES:  Particularly  with her PCP at the [facility] family  medicine clinic would be to followup on her potassium levels and a CBC  to insure that she is tolerating the fluconazole, and also followup her  intention to show up for her follow up appointment with Dr. Maurice March at the  HIV clinic.  She has declined going to this clinic and starting HAART in  the past.  We would like to encourage her to go now.  She was released  with a 30-day supply of the fluconazole, Bactrim and azithromycin.  It  would probably be wise to insure that she still has plenty of these left  to last her through her appointment with Dr. Maurice March.      Ardeen Garland, MD  Electronically Signed     LM/MEDQ  D:  02/08/2007  T:  02/09/2007  Job:  [id number]   cc:   Fransisco Hertz, M.D.  Drue Dun, M.D.

## 2010-12-14 ENCOUNTER — Other Ambulatory Visit: Payer: Self-pay | Admitting: Infectious Diseases

## 2010-12-14 DIAGNOSIS — B20 Human immunodeficiency virus [HIV] disease: Secondary | ICD-10-CM

## 2010-12-17 ENCOUNTER — Other Ambulatory Visit: Payer: Self-pay | Admitting: *Deleted

## 2011-01-13 ENCOUNTER — Other Ambulatory Visit (INDEPENDENT_AMBULATORY_CARE_PROVIDER_SITE_OTHER): Payer: Self-pay

## 2011-01-13 DIAGNOSIS — B20 Human immunodeficiency virus [HIV] disease: Secondary | ICD-10-CM

## 2011-01-13 LAB — CBC WITH DIFFERENTIAL/PLATELET
Basophils Absolute: 0 10*3/uL (ref 0.0–0.1)
Basophils Relative: 0 % (ref 0–1)
MCHC: 31.8 g/dL (ref 30.0–36.0)
Monocytes Absolute: 0.5 10*3/uL (ref 0.1–1.0)
Neutro Abs: 1.8 10*3/uL (ref 1.7–7.7)
Neutrophils Relative %: 45 % (ref 43–77)
RDW: 13.7 % (ref 11.5–15.5)

## 2011-01-13 LAB — COMPLETE METABOLIC PANEL WITH GFR
AST: 21 U/L (ref 0–37)
Albumin: 4.2 g/dL (ref 3.5–5.2)
Alkaline Phosphatase: 112 U/L (ref 39–117)
BUN: 10 mg/dL (ref 6–23)
Potassium: 4.2 mEq/L (ref 3.5–5.3)
Sodium: 142 mEq/L (ref 135–145)
Total Bilirubin: 0.8 mg/dL (ref 0.3–1.2)

## 2011-01-14 LAB — T-HELPER CELL (CD4) - (RCID CLINIC ONLY)
CD4 % Helper T Cell: 17 % — ABNORMAL LOW (ref 33–55)
CD4 T Cell Abs: 300 uL — ABNORMAL LOW (ref 400–2700)

## 2011-01-14 LAB — HIV-1 RNA QUANT-NO REFLEX-BLD: HIV 1 RNA Quant: 720 copies/mL — ABNORMAL HIGH (ref <20)

## 2011-01-23 ENCOUNTER — Ambulatory Visit (INDEPENDENT_AMBULATORY_CARE_PROVIDER_SITE_OTHER): Payer: Self-pay | Admitting: Infectious Diseases

## 2011-01-23 ENCOUNTER — Encounter: Payer: Self-pay | Admitting: Infectious Diseases

## 2011-01-23 DIAGNOSIS — I10 Essential (primary) hypertension: Secondary | ICD-10-CM

## 2011-01-23 DIAGNOSIS — Z21 Asymptomatic human immunodeficiency virus [HIV] infection status: Secondary | ICD-10-CM

## 2011-01-23 DIAGNOSIS — Z79899 Other long term (current) drug therapy: Secondary | ICD-10-CM

## 2011-01-23 DIAGNOSIS — Z113 Encounter for screening for infections with a predominantly sexual mode of transmission: Secondary | ICD-10-CM

## 2011-01-23 DIAGNOSIS — B20 Human immunodeficiency virus [HIV] disease: Secondary | ICD-10-CM

## 2011-01-23 MED ORDER — BENAZEPRIL HCL 10 MG PO TABS: 10.0000 mg | ORAL_TABLET | Freq: Every day | ORAL | Status: DC

## 2011-01-23 MED ORDER — HYDROCHLOROTHIAZIDE 25 MG PO TABS: 12.5000 mg | ORAL_TABLET | Freq: Every day | ORAL | Status: DC

## 2011-01-23 NOTE — Progress Notes (Signed)
  Subjective:    Patient ID: [name], female    DOB: Dec 11, 1955, 55 y.o.   MRN: [id number]  HPIShirley is doing very well and has no significant complaints. She has been missing some of her ARV doses and HIV is 720 and reviewed adherence issues and that she should take meds even if she forgot morning regimen. Her BP is now elevated at 160/105 range and she has been treated in past and will prescribe benazapril 10mg  and 1.5 HCTZ. Will f/u in 4-5 months.    Review of Systems  Constitutional: Negative.   HENT: Negative.   Eyes: Negative.   Respiratory: Negative.   Cardiovascular: Negative.   Gastrointestinal: Negative.   Genitourinary: Negative.   Musculoskeletal: Negative.   Neurological: Negative.   Hematological: Negative.        Objective:   Physical Exam Patient is healthy in appearance. No rashes and oral mucosa is normal. No adenopathy. Neck supple and no focal neurologic deficits.       Assessment & Plan:  Stable HIV but needs better adherence and counseled. If HIV is not better suppressed on next visit will refer to adherence counselor.  Hypertension has reemerged with her improving health and will begin benazapril10mg +HCTC 12.5mg  daily.

## 2011-01-31 ENCOUNTER — Ambulatory Visit: Payer: Self-pay

## 2011-04-21 LAB — T-HELPER CELL (CD4) - (RCID CLINIC ONLY): CD4 T Cell Abs: 20 — ABNORMAL LOW

## 2011-04-24 LAB — T-HELPER CELL (CD4) - (RCID CLINIC ONLY): CD4 % Helper T Cell: 9 — ABNORMAL LOW

## 2011-05-06 ENCOUNTER — Telehealth: Payer: Self-pay | Admitting: *Deleted

## 2011-05-06 NOTE — Telephone Encounter (Signed)
She will be here fri at 4pm for a flu shot

## 2011-05-08 LAB — T-HELPER CELL (CD4) - (RCID CLINIC ONLY): CD4 % Helper T Cell: 8 — ABNORMAL LOW

## 2011-05-09 ENCOUNTER — Ambulatory Visit: Payer: Self-pay

## 2011-05-12 ENCOUNTER — Ambulatory Visit (INDEPENDENT_AMBULATORY_CARE_PROVIDER_SITE_OTHER): Payer: Self-pay | Admitting: *Deleted

## 2011-05-12 ENCOUNTER — Ambulatory Visit: Payer: Self-pay

## 2011-05-12 DIAGNOSIS — Z0000 Encounter for general adult medical examination without abnormal findings: Secondary | ICD-10-CM

## 2011-05-12 DIAGNOSIS — Z23 Encounter for immunization: Secondary | ICD-10-CM

## 2011-05-13 LAB — AFB CULTURE WITH SMEAR (NOT AT ARMC): Acid Fast Smear: NONE SEEN

## 2011-05-13 LAB — COMPREHENSIVE METABOLIC PANEL
ALT: 17
ALT: 18
ALT: 19
AST: 27
AST: 30
AST: 30
Albumin: 2.6 — ABNORMAL LOW
Albumin: 2.8 — ABNORMAL LOW
Albumin: 2.9 — ABNORMAL LOW
Albumin: 2.9 — ABNORMAL LOW
Albumin: 3.1 — ABNORMAL LOW
Alkaline Phosphatase: 35 — ABNORMAL LOW
Alkaline Phosphatase: 35 — ABNORMAL LOW
Alkaline Phosphatase: 36 — ABNORMAL LOW
Alkaline Phosphatase: 41
Alkaline Phosphatase: 52
BUN: 13
BUN: 4 — ABNORMAL LOW
BUN: 5 — ABNORMAL LOW
BUN: 7
CO2: 25
CO2: 25
CO2: 25
CO2: 25
CO2: 27
Calcium: 8.5
Chloride: 100
Chloride: 110
Chloride: 111
Chloride: 112
Chloride: 113 — ABNORMAL HIGH
Creatinine, Ser: 0.96
Creatinine, Ser: 0.98
Creatinine, Ser: 1.04
Creatinine, Ser: 1.08
Creatinine, Ser: 1.09
GFR calc Af Amer: >60
GFR calc Af Amer: >60
GFR calc non Af Amer: 53 — ABNORMAL LOW
GFR calc non Af Amer: 56 — ABNORMAL LOW
GFR calc non Af Amer: 56 — ABNORMAL LOW
GFR calc non Af Amer: 60 — ABNORMAL LOW
GFR calc non Af Amer: >60
Glucose, Bld: 79
Glucose, Bld: 95
Glucose, Bld: 98
Potassium: 3 — ABNORMAL LOW
Potassium: 3 — ABNORMAL LOW
Potassium: 3.4 — ABNORMAL LOW
Potassium: 3.4 — ABNORMAL LOW
Potassium: 3.6
Potassium: 4.2
Sodium: 135
Sodium: 137
Sodium: 142
Total Bilirubin: 0.5
Total Bilirubin: 0.5
Total Bilirubin: 0.5
Total Bilirubin: 0.8
Total Bilirubin: 0.8
Total Bilirubin: 1.1
Total Protein: 5.8 — ABNORMAL LOW
Total Protein: 5.9 — ABNORMAL LOW
Total Protein: 6.3

## 2011-05-13 LAB — CMV ABS, IGG+IGM (CYTOMEGALOVIRUS): Cytomegalovirus Ab-IgG: 7.59 Index — ABNORMAL HIGH (ref <0.80)

## 2011-05-13 LAB — DIFFERENTIAL
Basophils Absolute: 0
Basophils Absolute: 0
Basophils Relative: 0
Basophils Relative: 1
Eosinophils Absolute: 0
Eosinophils Absolute: 0.1
Eosinophils Relative: 3
Eosinophils Relative: 5
Eosinophils Relative: 6 — ABNORMAL HIGH
Lymphocytes Relative: 34
Lymphocytes Relative: 6 — ABNORMAL LOW
Lymphs Abs: 0.3 — ABNORMAL LOW
Lymphs Abs: 0.6 — ABNORMAL LOW
Monocytes Absolute: 0.5
Monocytes Relative: 11
Monocytes Relative: 13 — ABNORMAL HIGH
Monocytes Relative: 18 — ABNORMAL HIGH
Neutro Abs: 1.4 — ABNORMAL LOW
Neutrophils Relative %: 65
Neutrophils Relative %: 71

## 2011-05-13 LAB — URINE CULTURE
Colony Count: 4000
Special Requests: NEGATIVE

## 2011-05-13 LAB — CBC
HCT: 22.7 — ABNORMAL LOW
HCT: 25.7 — ABNORMAL LOW
HCT: 27.9 — ABNORMAL LOW
HCT: 32.8 — ABNORMAL LOW
Hemoglobin: 10.9 — ABNORMAL LOW
Hemoglobin: 7.2 — CL
Hemoglobin: 7.7 — CL
Hemoglobin: 7.8 — CL
Hemoglobin: 8.7 — ABNORMAL LOW
Hemoglobin: 9.3 — ABNORMAL LOW
MCHC: 32.9
MCHC: 33.5
MCHC: 33.8
MCV: 78.1
MCV: 78.6
MCV: 79.2
MCV: 79.8
MCV: 80
MCV: 80.6
Platelets: 103 — ABNORMAL LOW
Platelets: 105 — ABNORMAL LOW
Platelets: 114 — ABNORMAL LOW
Platelets: 128 — ABNORMAL LOW
Platelets: 150
Platelets: 91 — ABNORMAL LOW
RBC: 2.71 — ABNORMAL LOW
RBC: 2.84 — ABNORMAL LOW
RBC: 2.9 — ABNORMAL LOW
RBC: 4.05
RDW: 12.8
RDW: 12.8
RDW: 12.9
RDW: 13
RDW: 13.4
WBC: 1.8 — ABNORMAL LOW
WBC: 2.1 — ABNORMAL LOW
WBC: 2.5 — ABNORMAL LOW
WBC: 2.6 — ABNORMAL LOW
WBC: 2.6 — ABNORMAL LOW
WBC: 3.2 — ABNORMAL LOW

## 2011-05-13 LAB — CD4/CD8 (T-HELPER/T-SUPPRESSOR CELL)
CD4 absolute: 20 — ABNORMAL LOW
CD4%: 6 — ABNORMAL LOW
CD8 T Cell Abs: 210 — ABNORMAL LOW
Ratio: 0.11 — ABNORMAL LOW

## 2011-05-13 LAB — CULTURE, BLOOD (ROUTINE X 2)

## 2011-05-13 LAB — IRON AND TIBC: UIBC: 189

## 2011-05-13 LAB — CSF CELL COUNT WITH DIFFERENTIAL
RBC Count, CSF: 34 — ABNORMAL HIGH
WBC, CSF: 34 — ABNORMAL HIGH

## 2011-05-13 LAB — BASIC METABOLIC PANEL
BUN: 6
CO2: 24
CO2: 26
Calcium: 8.6
Calcium: 8.6
Chloride: 104
Creatinine, Ser: 0.65
GFR calc Af Amer: >60
GFR calc Af Amer: >60
GFR calc non Af Amer: 51 — ABNORMAL LOW
Glucose, Bld: 79
Sodium: 143

## 2011-05-13 LAB — CSF CULTURE W GRAM STAIN

## 2011-05-13 LAB — MAGNESIUM
Magnesium: 1.4 — ABNORMAL LOW
Magnesium: 1.7
Magnesium: 2

## 2011-05-13 LAB — URINALYSIS, MICROSCOPIC ONLY
Bilirubin Urine: NEGATIVE
Hgb urine dipstick: NEGATIVE
Ketones, ur: 15 — AB
Nitrite: NEGATIVE
pH: 6.5

## 2011-05-13 LAB — TRANSFERRIN: Transferrin: 158 — ABNORMAL LOW

## 2011-05-13 LAB — CRYPTOCOCCAL ANTIGEN, CSF: Crypto Ag: POSITIVE — AB

## 2011-05-13 LAB — HIV-1 RNA QUANT-NO REFLEX-BLD: HIV-1 RNA Quant, Log: 5.45 — ABNORMAL HIGH (ref <1.70)

## 2011-05-13 LAB — PROTEIN AND GLUCOSE, CSF
Glucose, CSF: 34 — ABNORMAL LOW
Total  Protein, CSF: 33

## 2011-05-13 LAB — HIV-1 GENOTYPR PLUS

## 2011-05-13 LAB — TOXOPLASMA GONDII ANTIBODY, IGM: Toxoplasma Antibody- IgM: 0.2 IV

## 2011-05-13 LAB — POTASSIUM: Potassium: 3.5

## 2011-05-17 ENCOUNTER — Other Ambulatory Visit: Payer: Self-pay | Admitting: Infectious Diseases

## 2011-05-17 DIAGNOSIS — B20 Human immunodeficiency virus [HIV] disease: Secondary | ICD-10-CM

## 2011-06-02 ENCOUNTER — Other Ambulatory Visit (INDEPENDENT_AMBULATORY_CARE_PROVIDER_SITE_OTHER): Payer: Self-pay

## 2011-06-02 ENCOUNTER — Other Ambulatory Visit: Payer: Self-pay

## 2011-06-02 ENCOUNTER — Other Ambulatory Visit: Payer: Self-pay | Admitting: Internal Medicine

## 2011-06-02 DIAGNOSIS — Z79899 Other long term (current) drug therapy: Secondary | ICD-10-CM

## 2011-06-02 DIAGNOSIS — B20 Human immunodeficiency virus [HIV] disease: Secondary | ICD-10-CM

## 2011-06-02 DIAGNOSIS — Z113 Encounter for screening for infections with a predominantly sexual mode of transmission: Secondary | ICD-10-CM

## 2011-06-03 LAB — CBC WITH DIFFERENTIAL/PLATELET
Basophils Absolute: 0 10*3/uL (ref 0.0–0.1)
Basophils Relative: 1 % (ref 0–1)
Eosinophils Absolute: 0.2 10*3/uL (ref 0.0–0.7)
Eosinophils Relative: 2 % (ref 0–5)
Lymphs Abs: 2 10*3/uL (ref 0.7–4.0)
MCH: 26.9 pg (ref 26.0–34.0)
MCHC: 31 g/dL (ref 30.0–36.0)
MCV: 86.9 fL (ref 78.0–100.0)
Neutrophils Relative %: 55 % (ref 43–77)
Platelets: 191 10*3/uL (ref 150–400)
RDW: 13.5 % (ref 11.5–15.5)

## 2011-06-03 LAB — LIPID PANEL
Cholesterol: 183 mg/dL (ref 0–200)
Total CHOL/HDL Ratio: 4.3 Ratio
VLDL: 31 mg/dL (ref 0–40)

## 2011-06-03 LAB — T-HELPER CELL (CD4) - (RCID CLINIC ONLY): CD4 % Helper T Cell: 23 % — ABNORMAL LOW (ref 33–55)

## 2011-06-03 LAB — COMPREHENSIVE METABOLIC PANEL
ALT: 33 U/L (ref 0–35)
AST: 35 U/L (ref 0–37)
Albumin: 4.3 g/dL (ref 3.5–5.2)
Alkaline Phosphatase: 92 U/L (ref 39–117)
Glucose, Bld: 80 mg/dL (ref 70–99)
Potassium: 3.8 mEq/L (ref 3.5–5.3)
Sodium: 139 mEq/L (ref 135–145)
Total Bilirubin: 0.8 mg/dL (ref 0.3–1.2)
Total Protein: 7.1 g/dL (ref 6.0–8.3)

## 2011-06-03 LAB — RPR

## 2011-06-04 LAB — HIV-1 RNA QUANT-NO REFLEX-BLD: HIV 1 RNA Quant: 706 copies/mL — ABNORMAL HIGH (ref <20)

## 2011-06-12 ENCOUNTER — Ambulatory Visit (INDEPENDENT_AMBULATORY_CARE_PROVIDER_SITE_OTHER): Payer: Self-pay | Admitting: Infectious Diseases

## 2011-06-12 ENCOUNTER — Encounter: Payer: Self-pay | Admitting: Infectious Diseases

## 2011-06-12 VITALS — BP 130/81 | HR 65 | Temp 98.1°F | Wt 126.0 lb

## 2011-06-12 DIAGNOSIS — B20 Human immunodeficiency virus [HIV] disease: Secondary | ICD-10-CM

## 2011-06-12 NOTE — Progress Notes (Signed)
  Subjective:    Patient ID: Veronica Robles, female    DOB: 1956-01-09, 55 y.o.   MRN: [id number]  HPIShirley has been generally well but has been missing about 20% of her ARV doses. He HIV load is 700 having been suppressed over the past 2 years. She says that she lets other things get in the way of her adherence. She has no physical complaints. She is on unusual ARV regimen ot Isentress 400 bid and Reyataz 300mg  bid. She cannot tolerate ritonavir or other meds despite numerous attempts in past to help her. I worry about her non adherence and shared this with her. If she continues having trouble I will refer for counseling.      Review of Systems12 point ROS is negative.     Objective:   Physical ExamAbdomen:soft and nontender with normal bowel sounds. Midline SSI is now closing and no gross drainage.         Assessment & Plan:  Resolving ssi post surgery last month for adhesions and bowel obstruction. Will f/u for HIV in two months

## 2011-06-12 NOTE — Patient Instructions (Signed)
Please return in 4 to 5 months to see Dr. Maurice March Labs 2 weeks prior

## 2011-07-12 ENCOUNTER — Other Ambulatory Visit: Payer: Self-pay | Admitting: Infectious Diseases

## 2011-07-14 ENCOUNTER — Telehealth: Payer: Self-pay | Admitting: *Deleted

## 2011-07-14 NOTE — Telephone Encounter (Signed)
Veronica Robles at [facility] asked if she was to be on Norvir. I read him Dr's notes from last visit. He will fill what was ordered

## 2011-07-30 ENCOUNTER — Other Ambulatory Visit: Payer: Self-pay | Admitting: Infectious Diseases

## 2011-07-30 DIAGNOSIS — Z1231 Encounter for screening mammogram for malignant neoplasm of breast: Secondary | ICD-10-CM

## 2011-08-15 ENCOUNTER — Other Ambulatory Visit: Payer: Self-pay | Admitting: Infectious Diseases

## 2011-08-15 DIAGNOSIS — B20 Human immunodeficiency virus [HIV] disease: Secondary | ICD-10-CM

## 2011-08-26 ENCOUNTER — Ambulatory Visit (HOSPITAL_COMMUNITY)
Admission: RE | Admit: 2011-08-26 | Discharge: 2011-08-26 | Disposition: A | Discharge status: Home / Self Care | Payer: Self-pay | Source: Ambulatory Visit | Attending: Infectious Diseases | Admitting: Infectious Diseases

## 2011-08-26 DIAGNOSIS — Z1231 Encounter for screening mammogram for malignant neoplasm of breast: Secondary | ICD-10-CM | POA: Insufficient documentation

## 2011-09-12 ENCOUNTER — Ambulatory Visit: Payer: Self-pay

## 2011-11-03 ENCOUNTER — Other Ambulatory Visit: Payer: Self-pay | Admitting: Infectious Diseases

## 2011-11-03 DIAGNOSIS — Z113 Encounter for screening for infections with a predominantly sexual mode of transmission: Secondary | ICD-10-CM

## 2011-11-09 ENCOUNTER — Other Ambulatory Visit: Payer: Self-pay | Admitting: Infectious Diseases

## 2011-11-09 ENCOUNTER — Other Ambulatory Visit: Payer: Self-pay | Admitting: Internal Medicine

## 2011-12-11 ENCOUNTER — Other Ambulatory Visit: Payer: Self-pay

## 2011-12-25 ENCOUNTER — Ambulatory Visit: Payer: Self-pay | Admitting: Infectious Diseases

## 2011-12-30 ENCOUNTER — Other Ambulatory Visit (INDEPENDENT_AMBULATORY_CARE_PROVIDER_SITE_OTHER): Payer: Self-pay

## 2011-12-30 DIAGNOSIS — Z113 Encounter for screening for infections with a predominantly sexual mode of transmission: Secondary | ICD-10-CM

## 2011-12-30 DIAGNOSIS — B20 Human immunodeficiency virus [HIV] disease: Secondary | ICD-10-CM

## 2011-12-30 LAB — CBC WITH DIFFERENTIAL/PLATELET
Basophils Relative: 0 % (ref 0–1)
HCT: 38.6 % (ref 36.0–46.0)
Hemoglobin: 12.6 g/dL (ref 12.0–15.0)
Lymphocytes Relative: 35 % (ref 12–46)
MCHC: 32.6 g/dL (ref 30.0–36.0)
Monocytes Absolute: 0.5 10*3/uL (ref 0.1–1.0)
Monocytes Relative: 8 % (ref 3–12)
Neutro Abs: 3.5 10*3/uL (ref 1.7–7.7)
Neutrophils Relative %: 55 % (ref 43–77)
RBC: 4.61 MIL/uL (ref 3.87–5.11)
WBC: 6.3 10*3/uL (ref 4.0–10.5)

## 2011-12-30 NOTE — Progress Notes (Signed)
Addended by: Mariea Clonts D on: 12/30/2011 03:06 PM   Modules accepted: Orders

## 2011-12-31 LAB — COMPREHENSIVE METABOLIC PANEL
AST: 21 U/L (ref 0–37)
Albumin: 4.3 g/dL (ref 3.5–5.2)
BUN: 13 mg/dL (ref 6–23)
CO2: 29 mEq/L (ref 19–32)
Calcium: 9.7 mg/dL (ref 8.4–10.5)
Chloride: 103 mEq/L (ref 96–112)
Creat: 0.92 mg/dL (ref 0.50–1.10)
Glucose, Bld: 92 mg/dL (ref 70–99)
Potassium: 4 mEq/L (ref 3.5–5.3)

## 2012-01-01 ENCOUNTER — Other Ambulatory Visit: Payer: Self-pay

## 2012-01-01 LAB — HIV-1 RNA QUANT-NO REFLEX-BLD: HIV 1 RNA Quant: <20 copies/mL (ref <20)

## 2012-01-16 ENCOUNTER — Encounter: Payer: Self-pay | Admitting: Infectious Diseases

## 2012-01-16 ENCOUNTER — Ambulatory Visit (INDEPENDENT_AMBULATORY_CARE_PROVIDER_SITE_OTHER): Payer: Self-pay | Admitting: Infectious Diseases

## 2012-01-16 VITALS — BP 135/78 | HR 56 | Temp 98.3°F | Ht 62.0 in | Wt 128.0 lb

## 2012-01-16 DIAGNOSIS — B20 Human immunodeficiency virus [HIV] disease: Secondary | ICD-10-CM

## 2012-01-16 NOTE — Progress Notes (Signed)
Patient ID: TIARA MAULTSBY, female   DOB: Jan 10, 1956, 56 y.o.   MRN: [id number] Ms. 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 After an initial struggle tolerating ARVa and we eventually setlled on a somewhat unconventional regimen of Isentress, truvada and Reyataz. Barba could not tolerate ritonavir with bad N&V but she has remained suppressed. When ritonavir is co-formulated/FDA approved with cobicistat then this would be good choice.    ROS is negative for fevers, weight loss, GI disturbances, pain, or rashes. Her CD4 is 450 and HIV barely detectable at 21 copies/ml. EXAM BP 135/78  Pulse 56  Temp 98.3 F (36.8 C) (Oral)  Ht 5\' 2"  (1.575 m)  Wt 128 lb (58.06 kg)  BMI 23.41 kg/m2HEENT normal. Normal gait and strength. No mumurs and normal breath sounds. Impression Suppressed HIV infection and will continue present meds F/U in 4 months [name]

## 2012-01-23 ENCOUNTER — Ambulatory Visit (INDEPENDENT_AMBULATORY_CARE_PROVIDER_SITE_OTHER): Payer: Self-pay | Admitting: *Deleted

## 2012-01-23 DIAGNOSIS — Z124 Encounter for screening for malignant neoplasm of cervix: Secondary | ICD-10-CM

## 2012-01-23 NOTE — Patient Instructions (Signed)
  Your results will be ready in a little over a week.  I will either call or mail them to you.  Thank you for coming to the Center for your care.  Angelique Blonder, RN

## 2012-01-23 NOTE — Progress Notes (Signed)
Vaginal PAP smear obtained from hysterectomy patient.  No cervix found.  Pt given educational materials re:  HIV and women, heart disease, nutrition, diet, exercise, self-esteem,  BSE and PAP smears.  Pt given condoms.

## 2012-01-30 ENCOUNTER — Other Ambulatory Visit: Payer: Self-pay | Admitting: Infectious Diseases

## 2012-02-02 ENCOUNTER — Other Ambulatory Visit: Payer: Self-pay | Admitting: *Deleted

## 2012-02-02 DIAGNOSIS — I10 Essential (primary) hypertension: Secondary | ICD-10-CM

## 2012-02-02 MED ORDER — HYDROCHLOROTHIAZIDE 25 MG PO TABS: 12.5000 mg | ORAL_TABLET | Freq: Every day | ORAL | Status: DC

## 2012-02-03 ENCOUNTER — Encounter: Payer: Self-pay | Admitting: *Deleted

## 2012-02-14 ENCOUNTER — Other Ambulatory Visit: Payer: Self-pay | Admitting: Infectious Diseases

## 2012-03-10 ENCOUNTER — Ambulatory Visit: Payer: Self-pay

## 2012-04-16 ENCOUNTER — Other Ambulatory Visit: Payer: Self-pay | Admitting: Internal Medicine

## 2012-05-04 ENCOUNTER — Other Ambulatory Visit: Payer: Self-pay | Admitting: Infectious Diseases

## 2012-05-04 ENCOUNTER — Other Ambulatory Visit (INDEPENDENT_AMBULATORY_CARE_PROVIDER_SITE_OTHER): Payer: No Typology Code available for payment source

## 2012-05-04 DIAGNOSIS — B20 Human immunodeficiency virus [HIV] disease: Secondary | ICD-10-CM

## 2012-05-04 LAB — CBC WITH DIFFERENTIAL/PLATELET
Basophils Absolute: 0 10*3/uL (ref 0.0–0.1)
Eosinophils Absolute: 0.1 10*3/uL (ref 0.0–0.7)
Eosinophils Relative: 2 % (ref 0–5)
MCH: 27.4 pg (ref 26.0–34.0)
MCV: 81.5 fL (ref 78.0–100.0)
Platelets: 202 10*3/uL (ref 150–400)
RDW: 13.8 % (ref 11.5–15.5)

## 2012-05-05 LAB — HIV-1 RNA QUANT-NO REFLEX-BLD: HIV-1 RNA Quant, Log: <1.3 {Log} (ref <1.30)

## 2012-05-05 LAB — COMPREHENSIVE METABOLIC PANEL
ALT: 14 U/L (ref 0–35)
Alkaline Phosphatase: 76 U/L (ref 39–117)
CO2: 30 mEq/L (ref 19–32)
Sodium: 143 mEq/L (ref 135–145)
Total Bilirubin: 0.9 mg/dL (ref 0.3–1.2)
Total Protein: 6.7 g/dL (ref 6.0–8.3)

## 2012-05-06 ENCOUNTER — Other Ambulatory Visit: Payer: Self-pay

## 2012-05-14 ENCOUNTER — Other Ambulatory Visit: Payer: Self-pay | Admitting: Infectious Diseases

## 2012-05-17 ENCOUNTER — Other Ambulatory Visit: Payer: Self-pay | Admitting: *Deleted

## 2012-05-17 NOTE — Telephone Encounter (Signed)
Opened in error refills already done.

## 2012-05-21 ENCOUNTER — Ambulatory Visit (INDEPENDENT_AMBULATORY_CARE_PROVIDER_SITE_OTHER): Payer: Self-pay | Admitting: Infectious Diseases

## 2012-05-21 ENCOUNTER — Encounter: Payer: Self-pay | Admitting: Infectious Diseases

## 2012-05-21 VITALS — BP 135/84 | HR 59 | Temp 97.8°F | Ht 63.0 in | Wt 131.0 lb

## 2012-05-21 DIAGNOSIS — Z23 Encounter for immunization: Secondary | ICD-10-CM

## 2012-05-21 DIAGNOSIS — B20 Human immunodeficiency virus [HIV] disease: Secondary | ICD-10-CM

## 2012-05-21 NOTE — Progress Notes (Signed)
Patient ID: Veronica Robles, female   DOB: Jul 04, 1956, 56 y.o.   MRN: [id number] HIV f/u  Veronica Robles is doing very well with HIV <20 copies and slow but continuing restoration of CD4 with current count of 390. She is on Isentress, Truvada and Reyataz. Initially when Veronica Robles began ARVs she had great difficulty swallowing medications and had delay in suppression of her viral load. She was only able then to take meds sporadically but for past two years she has been very adherent.Will stopy Reyataz and Veronica Robles is in ageement. She takes her ACEI and HCTZ regularly and her BP is controlled.   ROS is negative and specifically she has had no fevers, weight loss, diarrhea or HAs, the latter asked because of her initial OI of cryptococcal meningitis. Flu shot given today. Physical Exam  Abdominal: There is guarding.  The above was prewritten quick not and not applicable to [facility] who has not abdominal complaints and has nontender abd exam BP 135/84  Pulse 59  Temp 97.8 F (36.6 C) (Oral)  Ht 5\' 3"  (1.6 m)  Wt 131 lb (59.421 kg)  BMI 23.21 kg/m2 No adenopathy. Alert and oriented.  Imp and plan HIV contolled and doing well. Will simplify ARV regiment to Atripla and D/C Reyataz RTC in 5 months HTN controlled Preventive care updated.[name]

## 2012-05-21 NOTE — Patient Instructions (Signed)
Please return in 4 to 5 months, and 2 weeks for labs

## 2012-06-10 ENCOUNTER — Other Ambulatory Visit: Payer: Self-pay | Admitting: Infectious Diseases

## 2012-07-17 ENCOUNTER — Other Ambulatory Visit: Payer: Self-pay | Admitting: Infectious Diseases

## 2012-07-29 ENCOUNTER — Other Ambulatory Visit: Payer: Self-pay | Admitting: Infectious Diseases

## 2012-07-29 DIAGNOSIS — Z1231 Encounter for screening mammogram for malignant neoplasm of breast: Secondary | ICD-10-CM

## 2012-08-14 ENCOUNTER — Other Ambulatory Visit: Payer: Self-pay | Admitting: Infectious Diseases

## 2012-09-01 ENCOUNTER — Ambulatory Visit (HOSPITAL_COMMUNITY)
Admission: RE | Admit: 2012-09-01 | Discharge: 2012-09-01 | Disposition: A | Discharge status: Home / Self Care | Payer: No Typology Code available for payment source | Source: Ambulatory Visit | Attending: Infectious Diseases | Admitting: Infectious Diseases

## 2012-09-01 DIAGNOSIS — Z1231 Encounter for screening mammogram for malignant neoplasm of breast: Secondary | ICD-10-CM

## 2012-09-16 ENCOUNTER — Other Ambulatory Visit: Payer: Self-pay | Admitting: Infectious Diseases

## 2012-09-16 DIAGNOSIS — B20 Human immunodeficiency virus [HIV] disease: Secondary | ICD-10-CM

## 2012-09-17 ENCOUNTER — Ambulatory Visit: Payer: Self-pay

## 2012-09-17 ENCOUNTER — Other Ambulatory Visit: Payer: Self-pay | Admitting: Infectious Diseases

## 2012-09-17 ENCOUNTER — Other Ambulatory Visit (INDEPENDENT_AMBULATORY_CARE_PROVIDER_SITE_OTHER): Payer: Self-pay

## 2012-09-17 DIAGNOSIS — B20 Human immunodeficiency virus [HIV] disease: Secondary | ICD-10-CM

## 2012-09-17 DIAGNOSIS — Z113 Encounter for screening for infections with a predominantly sexual mode of transmission: Secondary | ICD-10-CM

## 2012-09-17 DIAGNOSIS — Z79899 Other long term (current) drug therapy: Secondary | ICD-10-CM

## 2012-09-17 LAB — LIPID PANEL
Cholesterol: 158 mg/dL (ref 0–200)
HDL: 42 mg/dL (ref >39)
Total CHOL/HDL Ratio: 3.8 Ratio
Triglycerides: 79 mg/dL (ref <150)

## 2012-09-17 LAB — CBC WITH DIFFERENTIAL/PLATELET
Basophils Absolute: 0 10*3/uL (ref 0.0–0.1)
Basophils Relative: 0 % (ref 0–1)
Eosinophils Absolute: 0.1 10*3/uL (ref 0.0–0.7)
MCH: 26.4 pg (ref 26.0–34.0)
MCHC: 32.3 g/dL (ref 30.0–36.0)
Monocytes Absolute: 0.7 10*3/uL (ref 0.1–1.0)
Neutro Abs: 2.2 10*3/uL (ref 1.7–7.7)
Neutrophils Relative %: 50 % (ref 43–77)
RDW: 14.5 % (ref 11.5–15.5)

## 2012-09-17 LAB — COMPREHENSIVE METABOLIC PANEL
AST: 23 U/L (ref 0–37)
Albumin: 4.2 g/dL (ref 3.5–5.2)
Alkaline Phosphatase: 72 U/L (ref 39–117)
BUN: 13 mg/dL (ref 6–23)
Potassium: 4 mEq/L (ref 3.5–5.3)
Sodium: 143 mEq/L (ref 135–145)
Total Bilirubin: 0.4 mg/dL (ref 0.3–1.2)

## 2012-09-17 LAB — T-HELPER CELL (CD4) - (RCID CLINIC ONLY): CD4 T Cell Abs: 260 uL — ABNORMAL LOW (ref 400–2700)

## 2012-09-17 LAB — RPR

## 2012-09-19 LAB — HIV-1 RNA QUANT-NO REFLEX-BLD: HIV-1 RNA Quant, Log: 3.12 {Log} — ABNORMAL HIGH (ref <1.30)

## 2012-10-01 ENCOUNTER — Ambulatory Visit: Payer: Self-pay | Admitting: Infectious Diseases

## 2012-10-01 ENCOUNTER — Ambulatory Visit: Payer: Self-pay

## 2012-10-14 ENCOUNTER — Other Ambulatory Visit: Payer: Self-pay | Admitting: Infectious Diseases

## 2012-10-15 ENCOUNTER — Ambulatory Visit: Payer: Self-pay

## 2012-10-25 ENCOUNTER — Other Ambulatory Visit: Payer: Self-pay | Admitting: Infectious Diseases

## 2012-10-26 ENCOUNTER — Other Ambulatory Visit: Payer: Self-pay | Admitting: Infectious Diseases

## 2012-11-11 ENCOUNTER — Other Ambulatory Visit: Payer: Self-pay | Admitting: Infectious Diseases

## 2012-11-26 ENCOUNTER — Other Ambulatory Visit: Payer: Self-pay | Admitting: Infectious Diseases

## 2012-12-03 ENCOUNTER — Ambulatory Visit (INDEPENDENT_AMBULATORY_CARE_PROVIDER_SITE_OTHER): Payer: No Typology Code available for payment source | Admitting: Infectious Diseases

## 2012-12-03 ENCOUNTER — Other Ambulatory Visit: Payer: Self-pay | Admitting: *Deleted

## 2012-12-03 ENCOUNTER — Other Ambulatory Visit: Payer: Self-pay | Admitting: Infectious Diseases

## 2012-12-03 ENCOUNTER — Encounter: Payer: Self-pay | Admitting: Infectious Diseases

## 2012-12-03 VITALS — BP 120/72 | HR 71 | Temp 98.3°F | Ht 62.0 in | Wt 130.0 lb

## 2012-12-03 DIAGNOSIS — B20 Human immunodeficiency virus [HIV] disease: Secondary | ICD-10-CM

## 2012-12-03 MED ORDER — EMTRICITABINE-TENOFOVIR DF 200-300 MG PO TABS: 1.0000 | ORAL_TABLET | Freq: Every day | ORAL | Status: DC

## 2012-12-03 MED ORDER — DOLUTEGRAVIR SODIUM 50 MG PO TABS: 1.0000 | ORAL_TABLET | Freq: Every day | ORAL | Status: DC

## 2012-12-03 NOTE — Progress Notes (Unsigned)
HPI: Veronica Robles is a 57 y.o. female with a long Hx of HIV. She had been well controlled in the past but presents today with an elevated VL. She is otherwise doing well and without complaint.  Allergies: Allergies  Allergen Reactions  . Penicillins     REACTION: rash    Vitals: Temp: 98.3 F (36.8 C) (05/09 1029) Temp src: Oral (05/09 1029) BP: 120/72 mmHg (05/09 1029) Pulse Rate: 71 (05/09 1029)  Past Medical History: No past medical history on file.  Social History: History   Social History  . Marital Status: Single    Spouse Name: N/A    Number of Children: N/A  . Years of Education: N/A   Social History Main Topics  . Smoking status: Former Smoker -- 0.10 packs/day    Types: Cigarettes  . Smokeless tobacco: Never Used  . Alcohol Use: No  . Drug Use: No  . Sexually Active: None   Other Topics Concern  . None   Social History Narrative  . None    Previous Regimen: Atazanavir (unboosted due to intolerance to ritonavir), raltegravir, Truvada  Current Regimen: Raltegravir, Truvada  Labs: HIV 1 RNA Quant (copies/mL)  Date Value  09/17/2012 1315*  05/04/2012 <20   12/30/2011 <20      CD4 T Cell Abs (cmm)  Date Value  09/17/2012 260*  05/04/2012 390*  12/30/2011 450      Hep B S Ab (no units)  Date Value  08/08/2009 INDETER*     Hepatitis B Surface Ag (no units)  Date Value  08/08/2009 NEG      HCV Ab (no units)  Date Value  08/08/2009 NEG     CrCl: Estimated Creatinine Clearance: 50.1 ml/min (by C-G formula based on Cr of 0.98).  Lipids:    Component Value Date/Time   CHOL 158 09/17/2012 0922   TRIG 79 09/17/2012 0922   HDL 42 09/17/2012 0922   CHOLHDL 3.8 09/17/2012 0922   VLDL 16 09/17/2012 0922   LDLCALC 100* 09/17/2012 0922    Assessment: Patient states she doesn't misses 1 dose of medication a month on average, but her pharmacy says that her fill Hx has been more sporadic than this. We spoke about adherence and counseled on switching  her regimen from raltegravir/Truvada to dolutegravir/Truvada. 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 Also meant to give HBV vaccine today, but it never got administered.   Recommendations: - Order genotype - Change raltegravir to dolutegravir - Continue Truvada - Administer HBV vaccine at next visit   Drue Stager, PharmD [facility] 12/03/2012, 2:14 PM

## 2012-12-03 NOTE — Progress Notes (Unsigned)
HIV f/u visit   Veronica Robles says she is doing well and adhering to her ARV regimen that has been issue in the past and now looks as if non adherence and loss of HIV control has recurred despite Levon saying that she is taking her meds. Her pharmacy refill history has been spotty over the past few months and confirmed by phone today. Her most recent HIV is just of 1,700 copies/ml and her CD4 count has been trending down over the past year and now is 230/ml3. Her last prescribed regimen is Truvada and Isentress.  Exam  Well nourished and no rashes or thrush. BP 120/72  Pulse 71  Temp(Src) 98.3 F (36.8 C) (Oral)  Ht 5\' 2"  (1.575 m)  Wt 130 lb (58.968 kg)  BMI 23.77 kg/m2  I&P HIV not controlled and most likely to nonadherence. Plan will be refer for adherence counseling, obtain HIV genotype today and change Isentress to Tivicay(dolutegravir). followup with Dr. Judyann Munson in about 8 weeks and recheck HIV and CD4. Genotype results can then help guide future therapy. Veronica Robles is sane and works regularly. She has had great difficulty accepting that she is HIV and HCV infected and this nonadherence and initial difficulty when first diagnosed of being able to swallow medications as signs of her denial. Possibly some counseling and insight into her behavioral issues will help. Lastly will begin HBV vaccine which was not done because of isolated HB e antibody positivity   Veronica Robles

## 2012-12-07 LAB — HIV-1 RNA QUANT-NO REFLEX-BLD
HIV 1 RNA Quant: 650 copies/mL — ABNORMAL HIGH (ref <20)
HIV-1 RNA Quant, Log: 2.81 {Log} — ABNORMAL HIGH (ref <1.30)

## 2013-01-19 ENCOUNTER — Other Ambulatory Visit: Payer: No Typology Code available for payment source

## 2013-01-19 ENCOUNTER — Other Ambulatory Visit (HOSPITAL_COMMUNITY)
Admission: RE | Admit: 2013-01-19 | Discharge: 2013-01-19 | Disposition: A | Discharge status: Home / Self Care | Payer: No Typology Code available for payment source | Source: Ambulatory Visit | Attending: Internal Medicine | Admitting: Internal Medicine

## 2013-01-19 DIAGNOSIS — Z113 Encounter for screening for infections with a predominantly sexual mode of transmission: Secondary | ICD-10-CM

## 2013-01-19 DIAGNOSIS — B20 Human immunodeficiency virus [HIV] disease: Secondary | ICD-10-CM

## 2013-01-20 LAB — CBC WITH DIFFERENTIAL/PLATELET
Eosinophils Absolute: 0.2 10*3/uL (ref 0.0–0.7)
Eosinophils Relative: 3 % (ref 0–5)
Hemoglobin: 11.7 g/dL — ABNORMAL LOW (ref 12.0–15.0)
Lymphs Abs: 1.8 10*3/uL (ref 0.7–4.0)
MCH: 26.1 pg (ref 26.0–34.0)
MCV: 80.2 fL (ref 78.0–100.0)
Monocytes Absolute: 0.5 10*3/uL (ref 0.1–1.0)
Monocytes Relative: 9 % (ref 3–12)
Platelets: 188 10*3/uL (ref 150–400)
RBC: 4.49 MIL/uL (ref 3.87–5.11)

## 2013-01-20 LAB — COMPREHENSIVE METABOLIC PANEL
BUN: 13 mg/dL (ref 6–23)
CO2: 25 mEq/L (ref 19–32)
Creat: 1.13 mg/dL — ABNORMAL HIGH (ref 0.50–1.10)
Glucose, Bld: 85 mg/dL (ref 70–99)
Total Bilirubin: 0.3 mg/dL (ref 0.3–1.2)

## 2013-01-20 LAB — T-HELPER CELL (CD4) - (RCID CLINIC ONLY): CD4 % Helper T Cell: 22 % — ABNORMAL LOW (ref 33–55)

## 2013-01-21 LAB — HIV-1 RNA QUANT-NO REFLEX-BLD
HIV 1 RNA Quant: <20 copies/mL (ref <20)
HIV-1 RNA Quant, Log: <1.3 {Log} (ref <1.30)

## 2013-02-02 ENCOUNTER — Encounter: Payer: Self-pay | Admitting: Internal Medicine

## 2013-02-02 ENCOUNTER — Ambulatory Visit (INDEPENDENT_AMBULATORY_CARE_PROVIDER_SITE_OTHER): Payer: No Typology Code available for payment source | Admitting: Internal Medicine

## 2013-02-02 VITALS — BP 130/79 | HR 71 | Temp 98.0°F | Wt 127.0 lb

## 2013-02-02 DIAGNOSIS — R768 Other specified abnormal immunological findings in serum: Secondary | ICD-10-CM

## 2013-02-02 DIAGNOSIS — B20 Human immunodeficiency virus [HIV] disease: Secondary | ICD-10-CM

## 2013-02-02 DIAGNOSIS — R894 Abnormal immunological findings in specimens from other organs, systems and tissues: Secondary | ICD-10-CM

## 2013-02-02 DIAGNOSIS — Z23 Encounter for immunization: Secondary | ICD-10-CM

## 2013-02-02 NOTE — Progress Notes (Signed)
[facility] CL[facility] NOTE  RFV: routine, re-establishing care with new provider Subjective:    Patient ID: Veronica Robles, female    DOB: [birth year], 57 y.o.   MRN: [id number]  HPI  Veronica Robles is a 57yo F with HIV diagnosed in 1993 in setting of CM, currently CD 4 count of 380/VL<20, recently changed to DLG/truvada in May 2014 after having VL <2,000 while on RLG/truvada. She is doing well with her current regimen. She denies having missed a dose. Does not have pill aversion, no difficulty swallowing pills, no nausea/vomiting as she did with her old regimen  RF for HIV: sex with HIV-infected IVDU in 1990-1993. She also states she has hepatitis but unclear what type. Records show Isolated HepB Core Ab, interdet HepB S Ab but HCV negative  Current Outpatient Prescriptions on File Prior to Visit  Medication Sig Dispense Refill  . benazepril (LOTENSIN) 10 MG tablet TAKE ONE TABLET BY MOUTH ONCE DAILY  30 tablet  3  . Blood Pressure Monitoring (ADULT BLOOD PRESSURE CUFF LG) KIT        . Dolutegravir Sodium (TIVICAY) 50 MG TABS Take 1 tablet (50 mg total) by mouth daily.  30 tablet  6  . emtricitabine-tenofovir (TRUVADA) 200-300 MG per tablet Take 1 tablet by mouth daily.  30 tablet  6  . hydrochlorothiazide (HYDRODIURIL) 25 MG tablet Take 0.5 tablets (12.5 mg total) by mouth daily. (1/2 tablet)  30 tablet  11   No current facility-administered medications on file prior to visit.   Active Ambulatory Problems    Diagnosis Date Noted  . AIDS 10/26/2006  . 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 HEPATITIS C 10/26/2006  . 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 HYPERTENSION, BENIGN 01/26/2007  . HX, PERSONAL, PENICILLIN ALLERGY 10/26/2006  . HX, PERSONAL, DRUG ALLERGY NOS 10/26/2006   Resolved Ambulatory Problems    Diagnosis Date Noted  . No Resolved Ambulatory Problems   No Additional Past Medical History  -s/p hysterectomy  Soc hx: no smoking no drinking. 1 son (  pregnant at 17yo), single mom. She previously was a Social worker. Now has 2 grandson (3 and 21 yo), her son is grown Scientist, research (life sciences) works for [employer]. She works part-time at [employer].  Review of Systems Constitutional: Negative for fever, chills, diaphoresis, activity change, appetite change, fatigue and unexpected weight change.  HENT: Negative for congestion, sore throat, rhinorrhea, sneezing, trouble swallowing and sinus pressure.  Eyes: Negative for photophobia and visual disturbance.  Respiratory: Negative for cough, chest tightness, shortness of breath, wheezing and stridor.  Cardiovascular: Negative for chest pain, palpitations and leg swelling.  Gastrointestinal: Negative for nausea, vomiting, abdominal pain, diarrhea, constipation, blood in stool, abdominal distention and anal bleeding.  Genitourinary: Negative for dysuria, hematuria, flank pain and difficulty urinating.  Musculoskeletal: Negative for myalgias, back pain, joint swelling, arthralgias and gait problem.  Skin: Negative for color change, pallor, rash and wound.  Neurological: Negative for dizziness, tremors, weakness and light-headedness.  Hematological: Negative for adenopathy. Does not bruise/bleed easily.  Psychiatric/Behavioral: Negative for behavioral problems, confusion, sleep disturbance, dysphoric mood, decreased concentration and agitation.       Objective:   Physical Exam BP 130/79  Pulse 71  Temp(Src) 98 F (36.7 C) (Oral)  Wt 127 lb (57.607 kg)  BMI 23.22 kg/m2 Physical Exam  Constitutional: oriented to person, place, and time. appears well-developed and well-nourished. No distress.  HENT:  Mouth/Throat: Oropharynx is clear and moist. No oropharyngeal exudate.  Cardiovascular: Normal rate, regular rhythm  and normal heart sounds. Exam reveals no gallop and no friction rub.  No murmur heard.  Pulmonary/Chest: Effort normal and breath sounds normal. No respiratory distress. He has no wheezes.  Abdominal: Soft.  Bowel sounds are normal.  exhibits no distension. There is no tenderness.  Lymphadenopathy:  no cervical adenopathy.  Neurological:  alert and oriented to person, place, and time.  Skin: Skin is warm and dry. No rash noted. No erythema.  Psychiatric: a normal mood and affect. His behavior is normal.       Assessment & Plan:  HIV = continue with DLG/truvada. Doing well with adherence  Health maintenance = uptodate with mammo, not due until jan 2015  Isolated hep B c ab positive =will do hep B series, start hep B vax #1.   ? Hep C infection= hep C ab is negative. Will retest for ab at next blood draw.  rtc in 1 month for hep #2, and 3 month for visit.

## 2013-02-04 ENCOUNTER — Other Ambulatory Visit: Payer: Self-pay | Admitting: Infectious Diseases

## 2013-03-04 ENCOUNTER — Ambulatory Visit (INDEPENDENT_AMBULATORY_CARE_PROVIDER_SITE_OTHER): Payer: No Typology Code available for payment source | Admitting: *Deleted

## 2013-03-04 DIAGNOSIS — Z23 Encounter for immunization: Secondary | ICD-10-CM

## 2013-03-15 ENCOUNTER — Ambulatory Visit: Payer: No Typology Code available for payment source

## 2013-04-15 ENCOUNTER — Ambulatory Visit: Payer: No Typology Code available for payment source

## 2013-04-15 ENCOUNTER — Other Ambulatory Visit: Payer: Self-pay | Admitting: Infectious Diseases

## 2013-04-15 DIAGNOSIS — I10 Essential (primary) hypertension: Secondary | ICD-10-CM

## 2013-04-18 ENCOUNTER — Other Ambulatory Visit: Payer: Self-pay | Admitting: *Deleted

## 2013-04-18 DIAGNOSIS — I10 Essential (primary) hypertension: Secondary | ICD-10-CM

## 2013-04-18 MED ORDER — BENAZEPRIL HCL 10 MG PO TABS: 10.0000 mg | ORAL_TABLET | Freq: Every day | ORAL | Status: DC

## 2013-04-20 ENCOUNTER — Ambulatory Visit: Payer: Self-pay

## 2013-05-02 ENCOUNTER — Ambulatory Visit: Payer: Self-pay

## 2013-05-02 ENCOUNTER — Other Ambulatory Visit: Payer: Self-pay | Admitting: *Deleted

## 2013-05-02 ENCOUNTER — Other Ambulatory Visit (INDEPENDENT_AMBULATORY_CARE_PROVIDER_SITE_OTHER): Payer: No Typology Code available for payment source

## 2013-05-02 DIAGNOSIS — B20 Human immunodeficiency virus [HIV] disease: Secondary | ICD-10-CM

## 2013-05-02 LAB — CBC WITH DIFFERENTIAL/PLATELET
Basophils Absolute: 0 10*3/uL (ref 0.0–0.1)
Eosinophils Absolute: 0.1 10*3/uL (ref 0.0–0.7)
Eosinophils Relative: 3 % (ref 0–5)
HCT: 38.9 % (ref 36.0–46.0)
Lymphs Abs: 1.5 10*3/uL (ref 0.7–4.0)
MCH: 27.5 pg (ref 26.0–34.0)
MCV: 82.4 fL (ref 78.0–100.0)
Monocytes Relative: 11 % (ref 3–12)
Neutro Abs: 2.3 10*3/uL (ref 1.7–7.7)
Neutrophils Relative %: 52 % (ref 43–77)
Platelets: 179 10*3/uL (ref 150–400)
RBC: 4.72 MIL/uL (ref 3.87–5.11)
RDW: 14.1 % (ref 11.5–15.5)
WBC: 4.4 10*3/uL (ref 4.0–10.5)

## 2013-05-02 LAB — COMPLETE METABOLIC PANEL WITH GFR
ALT: 17 U/L (ref 0–35)
AST: 23 U/L (ref 0–37)
Alkaline Phosphatase: 75 U/L (ref 39–117)
Creat: 1.16 mg/dL — ABNORMAL HIGH (ref 0.50–1.10)
GFR, Est African American: 60 mL/min
Sodium: 140 mEq/L (ref 135–145)
Total Bilirubin: 0.3 mg/dL (ref 0.3–1.2)
Total Protein: 7 g/dL (ref 6.0–8.3)

## 2013-05-02 LAB — HEPATITIS C ANTIBODY: HCV Ab: NEGATIVE

## 2013-05-03 LAB — T-HELPER CELL (CD4) - (RCID CLINIC ONLY)
CD4 % Helper T Cell: 19 % — ABNORMAL LOW (ref 33–55)
CD4 T Cell Abs: 340 /uL — ABNORMAL LOW (ref 400–2700)

## 2013-05-03 LAB — HIV-1 RNA QUANT-NO REFLEX-BLD
HIV 1 RNA Quant: <20 copies/mL (ref <20)
HIV-1 RNA Quant, Log: <1.3 {Log} (ref <1.30)

## 2013-05-17 ENCOUNTER — Ambulatory Visit: Payer: Self-pay | Admitting: Internal Medicine

## 2013-05-18 ENCOUNTER — Ambulatory Visit: Payer: Self-pay | Admitting: Internal Medicine

## 2013-06-02 ENCOUNTER — Encounter: Payer: Self-pay | Admitting: Internal Medicine

## 2013-06-02 ENCOUNTER — Ambulatory Visit (INDEPENDENT_AMBULATORY_CARE_PROVIDER_SITE_OTHER): Payer: No Typology Code available for payment source | Admitting: Internal Medicine

## 2013-06-02 VITALS — BP 137/82 | HR 70 | Temp 98.2°F | Wt 130.0 lb

## 2013-06-02 DIAGNOSIS — Z23 Encounter for immunization: Secondary | ICD-10-CM

## 2013-06-02 DIAGNOSIS — I10 Essential (primary) hypertension: Secondary | ICD-10-CM

## 2013-06-02 MED ORDER — BENAZEPRIL-HYDROCHLOROTHIAZIDE 10-12.5 MG PO TABS: 1.0000 | ORAL_TABLET | Freq: Every day | ORAL | Status: DC

## 2013-06-02 NOTE — Progress Notes (Signed)
[facility] NOTE  RFV: routine Subjective:    Patient ID: Veronica Robles, female    DOB: October 26, 1955, 57 y.o.   MRN: [id number]  HPI Veronica Robles is a 57yo F with HIV diagnosed in 1993 in setting of CM, currently CD 4 count of 340/VL<20, (Oct 2014) recently changed to DLG/truvada in May 2014 after having VL <2,000 while on RLG/truvada. She is doing well with her current regimen. She denies having missed a dose.   Current Outpatient Prescriptions on File Prior to Visit  Medication Sig Dispense Refill  . benazepril (LOTENSIN) 10 MG tablet Take 1 tablet (10 mg total) by mouth daily.  30 tablet  3  . Blood Pressure Monitoring (ADULT BLOOD PRESSURE CUFF LG) KIT        . Dolutegravir Sodium (TIVICAY) 50 MG TABS Take 1 tablet (50 mg total) by mouth daily.  30 tablet  6  . emtricitabine-tenofovir (TRUVADA) 200-300 MG per tablet Take 1 tablet by mouth daily.  30 tablet  6  . hydrochlorothiazide (HYDRODIURIL) 25 MG tablet TAKE ONE-HALF TABLET BY MOUTH EVERY DAY  30 tablet  3   No current facility-administered medications on file prior to visit.     RF for HIV: sex with HIV-infected IVDU in 1990-1993. She also states she has hepatitis but unclear what type. Records show Isolated HepB Core Ab, interdet HepB S Ab but HCV negative  Soc hx: works at [employer]   Review of Systems     Objective:   Physical Exam BP 137/82  Pulse 70  Temp(Src) 98.2 F (36.8 C) (Oral)  Wt 130 lb (58.968 kg) Physical Exam  Constitutional:  oriented to person, place, and time. appears well-developed and well-nourished. No distress.  HENT:  Mouth/Throat: Oropharynx is clear and moist. No oropharyngeal exudate.  Cardiovascular: Normal rate, regular rhythm and normal heart sounds. Exam reveals no gallop and no friction rub.  No murmur heard.  Pulmonary/Chest: Effort normal and breath sounds normal. No respiratory distress.  no wheezes.  Abdominal: Soft. Bowel sounds are normal. exhibits no distension. There is  no tenderness.  Lymphadenopathy: no cervical adenopathy.  Neurological:  alert and oriented to person, place, and time.  Skin: Skin is warm and dry. No rash noted. No erythema.  Psychiatric: a normal mood and affect.  behavior is normal.      Assessment & Plan:  HIV = continue with tivicay/truvada  Health maintenance =will give flu vax today; up for 3rd dose of hep B in January  Women's health = mammo in jan/feb  HTN = will combine ace-HCTZ, benazapril 10-HCTZ 12.5mg  one tab a day  Pill aversion = doing ok thus far.  rtc in 3months, labs 2 wk prior

## 2013-06-21 ENCOUNTER — Other Ambulatory Visit: Payer: Self-pay | Admitting: *Deleted

## 2013-06-21 DIAGNOSIS — I10 Essential (primary) hypertension: Secondary | ICD-10-CM

## 2013-06-21 MED ORDER — BENAZEPRIL-HYDROCHLOROTHIAZIDE 10-12.5 MG PO TABS: 1.0000 | ORAL_TABLET | Freq: Every day | ORAL | Status: DC

## 2013-07-08 ENCOUNTER — Telehealth: Payer: Self-pay | Admitting: *Deleted

## 2013-07-08 DIAGNOSIS — B20 Human immunodeficiency virus [HIV] disease: Secondary | ICD-10-CM

## 2013-07-08 MED ORDER — DOLUTEGRAVIR SODIUM 50 MG PO TABS: 50.0000 mg | ORAL_TABLET | Freq: Every day | ORAL | Status: DC

## 2013-07-08 MED ORDER — EMTRICITABINE-TENOFOVIR DF 200-300 MG PO TABS: 1.0000 | ORAL_TABLET | Freq: Every day | ORAL | Status: DC

## 2013-07-08 NOTE — Telephone Encounter (Signed)
refilled 

## 2013-07-28 HISTORY — PX: BREAST BIOPSY: SHX20

## 2013-08-03 ENCOUNTER — Other Ambulatory Visit: Payer: Self-pay | Admitting: Internal Medicine

## 2013-08-03 ENCOUNTER — Other Ambulatory Visit: Payer: Self-pay | Admitting: Infectious Diseases

## 2013-08-03 DIAGNOSIS — Z1231 Encounter for screening mammogram for malignant neoplasm of breast: Secondary | ICD-10-CM

## 2013-09-05 ENCOUNTER — Ambulatory Visit (HOSPITAL_COMMUNITY)
Admission: RE | Admit: 2013-09-05 | Discharge: 2013-09-05 | Disposition: A | Discharge status: Home / Self Care | Payer: Self-pay | Source: Ambulatory Visit | Attending: Internal Medicine | Admitting: Internal Medicine

## 2013-09-05 DIAGNOSIS — Z1231 Encounter for screening mammogram for malignant neoplasm of breast: Secondary | ICD-10-CM

## 2013-09-06 ENCOUNTER — Encounter: Payer: Self-pay | Admitting: Internal Medicine

## 2013-09-06 ENCOUNTER — Ambulatory Visit: Payer: Self-pay | Admitting: *Deleted

## 2013-09-06 ENCOUNTER — Ambulatory Visit (INDEPENDENT_AMBULATORY_CARE_PROVIDER_SITE_OTHER): Payer: Self-pay | Admitting: Internal Medicine

## 2013-09-06 ENCOUNTER — Ambulatory Visit: Payer: Self-pay

## 2013-09-06 VITALS — BP 126/82 | HR 69 | Temp 97.6°F | Wt 129.0 lb

## 2013-09-06 DIAGNOSIS — B20 Human immunodeficiency virus [HIV] disease: Secondary | ICD-10-CM

## 2013-09-06 DIAGNOSIS — Z23 Encounter for immunization: Secondary | ICD-10-CM

## 2013-09-06 LAB — CBC WITH DIFFERENTIAL/PLATELET
BASOS PCT: 0 % (ref 0–1)
Basophils Absolute: 0 10*3/uL (ref 0.0–0.1)
EOS ABS: 0.1 10*3/uL (ref 0.0–0.7)
EOS PCT: 3 % (ref 0–5)
HCT: 40.6 % (ref 36.0–46.0)
Hemoglobin: 13.3 g/dL (ref 12.0–15.0)
Lymphocytes Relative: 36 % (ref 12–46)
Lymphs Abs: 1.5 10*3/uL (ref 0.7–4.0)
MCH: 27.8 pg (ref 26.0–34.0)
MCHC: 32.8 g/dL (ref 30.0–36.0)
MCV: 84.9 fL (ref 78.0–100.0)
Monocytes Absolute: 0.3 10*3/uL (ref 0.1–1.0)
Monocytes Relative: 7 % (ref 3–12)
Neutro Abs: 2.3 10*3/uL (ref 1.7–7.7)
Neutrophils Relative %: 54 % (ref 43–77)
PLATELETS: 200 10*3/uL (ref 150–400)
RBC: 4.78 MIL/uL (ref 3.87–5.11)
RDW: 14.3 % (ref 11.5–15.5)
WBC: 4.2 10*3/uL (ref 4.0–10.5)

## 2013-09-06 NOTE — Progress Notes (Signed)
   Subjective:    Patient ID: Veronica Robles, female    DOB: 01/06/1956, 58 y.o.   MRN: [id number]  HPI 58yo F with HIV, Cd 4 count of 340/VL <20 on truvada/tivicay, doing well with adherence, no side effects. Recovering from upper respirator infection, otherwise in good health.  Current Outpatient Prescriptions on File Prior to Visit  Medication Sig Dispense Refill  . benazepril-hydrochlorthiazide (LOTENSIN HCT) 10-12.5 MG per tablet Take 1 tablet by mouth daily.  30 tablet  11  . Blood Pressure Monitoring (ADULT BLOOD PRESSURE CUFF LG) KIT        . dolutegravir (TIVICAY) 50 MG tablet Take 1 tablet (50 mg total) by mouth daily.  30 tablet  6  . emtricitabine-tenofovir (TRUVADA) 200-300 MG per tablet Take 1 tablet by mouth daily.  30 tablet  6   No current facility-administered medications on file prior to visit.   Active Ambulatory Problems    Diagnosis Date Noted  . AIDS 10/26/2006  . 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 HEPATITIS C 10/26/2006  . 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 HYPERTENSION, BENIGN 01/26/2007  . HX, PERSONAL, PENICILLIN ALLERGY 10/26/2006  . HX, PERSONAL, DRUG ALLERGY NOS 10/26/2006   Resolved Ambulatory Problems    Diagnosis Date Noted  . No Resolved Ambulatory Problems   No Additional Past Medical History    Review of Systems Review of Systems  Constitutional: + nightsweats. Negative for fever, chills, diaphoresis, activity change, appetite change, fatigue and unexpected weight change.  HENT: Negative for congestion, sore throat, rhinorrhea, sneezing, trouble swallowing and sinus pressure.  Eyes: Negative for photophobia and visual disturbance.  Respiratory: Negative for cough, chest tightness, shortness of breath, wheezing and stridor.  Cardiovascular: Negative for chest pain, palpitations and leg swelling.  Gastrointestinal: Negative for nausea, vomiting, abdominal pain, diarrhea, constipation, blood in stool,  abdominal distention and anal bleeding.  Genitourinary: Negative for dysuria, hematuria, flank pain and difficulty urinating.  Musculoskeletal: Negative for myalgias, back pain, joint swelling, arthralgias and gait problem.  Skin: Negative for color change, pallor, rash and wound.  Neurological: Negative for dizziness, tremors, weakness and light-headedness.  Hematological: Negative for adenopathy. Does not bruise/bleed easily.  Psychiatric/Behavioral: Negative for behavioral problems, confusion, sleep disturbance, dysphoric mood, decreased concentration and agitation.       Objective:   Physical Exam BP 126/82  Pulse 69  Temp(Src) 97.6 F (36.4 C) (Oral)  Wt 129 lb (58.514 kg) Physical Exam  Constitutional:  oriented to person, place, and time. appears well-developed and well-nourished. No distress.  HENT:  Mouth/Throat: Oropharynx is clear and moist. No oropharyngeal exudate.  Cardiovascular: Normal rate, regular rhythm and normal heart sounds. Exam reveals no gallop and no friction rub.  No murmur heard.  Pulmonary/Chest: Effort normal and breath sounds normal. No respiratory distress.  has no wheezes.  Lymphadenopathy: no cervical adenopathy.  Neurological: alert and oriented to person, place, and time.  Skin: Skin is warm and dry. No rash noted. No erythema.         Assessment & Plan:  HIV disease = well controlled. Will continue her current meds and check labs  Health maintenance = will get 3rd dose of hep b series; had mammo yesterday  htn = well controlled  nightsweats = at baseline .likely due to postmenopausal  rtc in 3 months

## 2013-09-07 LAB — COMPLETE METABOLIC PANEL WITH GFR
ALT: 16 U/L (ref 0–35)
AST: 24 U/L (ref 0–37)
Albumin: 4.4 g/dL (ref 3.5–5.2)
Alkaline Phosphatase: 80 U/L (ref 39–117)
BILIRUBIN TOTAL: 0.4 mg/dL (ref 0.2–1.2)
BUN: 14 mg/dL (ref 6–23)
CO2: 30 meq/L (ref 19–32)
CREATININE: 1.14 mg/dL — AB (ref 0.50–1.10)
Calcium: 9.7 mg/dL (ref 8.4–10.5)
Chloride: 102 mEq/L (ref 96–112)
GFR, EST AFRICAN AMERICAN: 62 mL/min
GFR, Est Non African American: 54 mL/min — ABNORMAL LOW
Glucose, Bld: 117 mg/dL — ABNORMAL HIGH (ref 70–99)
Potassium: 4.1 mEq/L (ref 3.5–5.3)
Sodium: 141 mEq/L (ref 135–145)
Total Protein: 6.6 g/dL (ref 6.0–8.3)

## 2013-09-07 LAB — HIV-1 RNA QUANT-NO REFLEX-BLD: HIV 1 RNA Quant: <20 copies/mL (ref <20)

## 2013-09-07 LAB — T-HELPER CELL (CD4) - (RCID CLINIC ONLY)
CD4 % Helper T Cell: 22 % — ABNORMAL LOW (ref 33–55)
CD4 T Cell Abs: 370 /uL — ABNORMAL LOW (ref 400–2700)

## 2013-09-19 ENCOUNTER — Other Ambulatory Visit: Payer: Self-pay | Admitting: *Deleted

## 2013-09-19 DIAGNOSIS — B20 Human immunodeficiency virus [HIV] disease: Secondary | ICD-10-CM

## 2013-09-19 MED ORDER — DOLUTEGRAVIR SODIUM 50 MG PO TABS
50.0000 mg | ORAL_TABLET | Freq: Every day | ORAL | Status: DC
Start: 2013-09-19 — End: 2014-02-09

## 2013-09-19 MED ORDER — EMTRICITABINE-TENOFOVIR DF 200-300 MG PO TABS: 1.0000 | ORAL_TABLET | Freq: Every day | ORAL | Status: DC

## 2013-09-26 ENCOUNTER — Other Ambulatory Visit: Payer: Self-pay | Admitting: Internal Medicine

## 2013-09-26 DIAGNOSIS — R928 Other abnormal and inconclusive findings on diagnostic imaging of breast: Secondary | ICD-10-CM

## 2013-09-27 ENCOUNTER — Ambulatory Visit: Payer: Self-pay | Attending: Internal Medicine

## 2013-10-18 ENCOUNTER — Encounter (HOSPITAL_COMMUNITY): Payer: Self-pay

## 2013-10-18 ENCOUNTER — Encounter (INDEPENDENT_AMBULATORY_CARE_PROVIDER_SITE_OTHER): Payer: Self-pay

## 2013-10-18 ENCOUNTER — Ambulatory Visit (HOSPITAL_COMMUNITY)
Admission: RE | Admit: 2013-10-18 | Discharge: 2013-10-18 | Disposition: A | Discharge status: Home / Self Care | Payer: Self-pay | Source: Ambulatory Visit | Attending: Obstetrics and Gynecology | Admitting: Obstetrics and Gynecology

## 2013-10-18 VITALS — BP 130/72 | Temp 97.8°F | Ht 62.0 in | Wt 129.4 lb

## 2013-10-18 DIAGNOSIS — Z1239 Encounter for other screening for malignant neoplasm of breast: Secondary | ICD-10-CM

## 2013-10-18 HISTORY — DX: Human immunodeficiency virus (HIV) disease: B20

## 2013-10-18 HISTORY — DX: Essential (primary) hypertension: I10

## 2013-10-18 NOTE — Patient Instructions (Addendum)
Sanders how to perform BSE and gave educational materials to take home. Patient did not need a Pap smear today due a history of hysterectomy for benign reasons. Let patient know that she does not need any further Pap smears due to a history of a hysterectomy for benign reasons. Referred patient to the [facility] for diagnostic mammogram and possible right breast ultrasound per recommendation. Appointment scheduled for Monday, October 24, 2013 at 1000. Patient aware of appointment and will be there. [name] verbalized understanding.  Brannock, Arvil Chaco, RN 8:37 AM

## 2013-10-18 NOTE — Progress Notes (Signed)
Patient referred to [facility] due to needing additional imaging of her right breast. Screening mammogram completed 09/05/2013 at [place].  Pap Smear:  Pap smear not completed today. Last Pap smear was 01/23/2012 at the [facility] and normal. Per patient has no history of an abnormal Pap smear. Patient has a history of a hysterectomy for painful periods years ago. No further Pap smears are needed to a history of a hysterectomy for benign reasons per ACOG and BCCCP guidelines. Last Pap smear result is in EPIC.  Physical exam: Breasts Breasts symmetrical. No skin abnormalities bilateral breasts. No nipple retraction bilateral breasts. No nipple discharge bilateral breasts. No lymphadenopathy. No lumps palpated bilateral breasts. No complaints of pain or tenderness on exam. Referred patient to the [facility] for diagnostic mammogram and possible right breast ultrasound per recommendation. Appointment scheduled for Monday, October 24, 2013 at 1000.  Pelvic/Bimanual No Pap smear completed today since patient has a history of a hysterectomy for benign reasons. Pap smear not indicated per BCCCP guidelines.

## 2013-10-24 ENCOUNTER — Other Ambulatory Visit: Payer: Self-pay | Admitting: Internal Medicine

## 2013-10-24 ENCOUNTER — Ambulatory Visit
Admission: RE | Admit: 2013-10-24 | Discharge: 2013-10-24 | Disposition: A | Discharge status: Home / Self Care | Payer: No Typology Code available for payment source | Source: Ambulatory Visit | Attending: Internal Medicine | Admitting: Internal Medicine

## 2013-10-24 DIAGNOSIS — R928 Other abnormal and inconclusive findings on diagnostic imaging of breast: Secondary | ICD-10-CM

## 2013-10-24 DIAGNOSIS — N6310 Unspecified lump in the right breast, unspecified quadrant: Secondary | ICD-10-CM

## 2013-11-07 ENCOUNTER — Ambulatory Visit
Admission: RE | Admit: 2013-11-07 | Discharge: 2013-11-07 | Disposition: A | Discharge status: Home / Self Care | Payer: No Typology Code available for payment source | Source: Ambulatory Visit | Attending: Internal Medicine | Admitting: Internal Medicine

## 2013-11-07 ENCOUNTER — Other Ambulatory Visit: Payer: Self-pay | Admitting: Internal Medicine

## 2013-11-07 DIAGNOSIS — N6310 Unspecified lump in the right breast, unspecified quadrant: Secondary | ICD-10-CM

## 2013-12-05 ENCOUNTER — Other Ambulatory Visit (INDEPENDENT_AMBULATORY_CARE_PROVIDER_SITE_OTHER): Payer: Self-pay

## 2013-12-05 DIAGNOSIS — Z79899 Other long term (current) drug therapy: Secondary | ICD-10-CM

## 2013-12-05 DIAGNOSIS — B20 Human immunodeficiency virus [HIV] disease: Secondary | ICD-10-CM

## 2013-12-05 DIAGNOSIS — Z113 Encounter for screening for infections with a predominantly sexual mode of transmission: Secondary | ICD-10-CM

## 2013-12-05 LAB — COMPLETE METABOLIC PANEL WITH GFR
ALK PHOS: 81 U/L (ref 39–117)
ALT: 16 U/L (ref 0–35)
AST: 22 U/L (ref 0–37)
Albumin: 4.4 g/dL (ref 3.5–5.2)
BILIRUBIN TOTAL: 0.4 mg/dL (ref 0.2–1.2)
BUN: 13 mg/dL (ref 6–23)
CO2: 30 mEq/L (ref 19–32)
Calcium: 9.6 mg/dL (ref 8.4–10.5)
Chloride: 104 mEq/L (ref 96–112)
Creat: 1.13 mg/dL — ABNORMAL HIGH (ref 0.50–1.10)
GFR, Est African American: 62 mL/min
GFR, Est Non African American: 54 mL/min — ABNORMAL LOW
Glucose, Bld: 91 mg/dL (ref 70–99)
Potassium: 4.2 mEq/L (ref 3.5–5.3)
Sodium: 141 mEq/L (ref 135–145)
TOTAL PROTEIN: 7 g/dL (ref 6.0–8.3)

## 2013-12-05 LAB — CBC WITH DIFFERENTIAL/PLATELET
BASOS ABS: 0 10*3/uL (ref 0.0–0.1)
Basophils Relative: 0 % (ref 0–1)
EOS ABS: 0.1 10*3/uL (ref 0.0–0.7)
Eosinophils Relative: 3 % (ref 0–5)
HCT: 38.5 % (ref 36.0–46.0)
Hemoglobin: 12.8 g/dL (ref 12.0–15.0)
Lymphocytes Relative: 36 % (ref 12–46)
Lymphs Abs: 1.8 10*3/uL (ref 0.7–4.0)
MCH: 27.5 pg (ref 26.0–34.0)
MCHC: 33.2 g/dL (ref 30.0–36.0)
MCV: 82.8 fL (ref 78.0–100.0)
Monocytes Absolute: 0.4 10*3/uL (ref 0.1–1.0)
Monocytes Relative: 8 % (ref 3–12)
Neutro Abs: 2.6 10*3/uL (ref 1.7–7.7)
Neutrophils Relative %: 53 % (ref 43–77)
PLATELETS: 192 10*3/uL (ref 150–400)
RBC: 4.65 MIL/uL (ref 3.87–5.11)
RDW: 14.8 % (ref 11.5–15.5)
WBC: 4.9 10*3/uL (ref 4.0–10.5)

## 2013-12-05 LAB — LIPID PANEL
Cholesterol: 177 mg/dL (ref 0–200)
HDL: 47 mg/dL (ref >39)
LDL Cholesterol: 115 mg/dL — ABNORMAL HIGH (ref 0–99)
TRIGLYCERIDES: 77 mg/dL (ref <150)
Total CHOL/HDL Ratio: 3.8 Ratio
VLDL: 15 mg/dL (ref 0–40)

## 2013-12-05 LAB — RPR

## 2013-12-06 LAB — T-HELPER CELL (CD4) - (RCID CLINIC ONLY)
CD4 % Helper T Cell: 23 % — ABNORMAL LOW (ref 33–55)
CD4 T CELL ABS: 430 /uL (ref 400–2700)

## 2013-12-06 LAB — URINE CYTOLOGY ANCILLARY ONLY: CHLAMYDIA, DNA PROBE: NEGATIVE

## 2013-12-07 LAB — HIV-1 RNA QUANT-NO REFLEX-BLD
HIV 1 RNA Quant: <20 copies/mL (ref <20)
HIV-1 RNA Quant, Log: <1.3 {Log} (ref <1.30)

## 2013-12-20 ENCOUNTER — Ambulatory Visit (INDEPENDENT_AMBULATORY_CARE_PROVIDER_SITE_OTHER): Payer: Self-pay | Admitting: Internal Medicine

## 2013-12-20 ENCOUNTER — Encounter: Payer: Self-pay | Admitting: Internal Medicine

## 2013-12-20 VITALS — BP 127/79 | HR 65 | Temp 98.8°F | Wt 128.0 lb

## 2013-12-20 DIAGNOSIS — B20 Human immunodeficiency virus [HIV] disease: Secondary | ICD-10-CM

## 2013-12-20 DIAGNOSIS — I10 Essential (primary) hypertension: Secondary | ICD-10-CM

## 2013-12-20 DIAGNOSIS — Z0000 Encounter for general adult medical examination without abnormal findings: Secondary | ICD-10-CM

## 2013-12-20 NOTE — Progress Notes (Signed)
Subjective:    Patient ID: Veronica Robles, female    DOB: 1955-11-08, 58 y.o.   MRN: [id number]  HPI 58yo F with HIV, CD 4 count of 430/VL<20 on May 2015, on truvada/tivicay. Had breast biopsy from recent mammo in April. Path showed hyperplasia of lymph tissue only. Doing well overall. She feels that the area from the biopsy is still swollen. No warmth or drainage from that area. Overall in good health. No recent illness  Current Outpatient Prescriptions on File Prior to Visit  Medication Sig Dispense Refill  . benazepril-hydrochlorthiazide (LOTENSIN HCT) 10-12.5 MG per tablet Take 1 tablet by mouth daily.  30 tablet  11  . Blood Pressure Monitoring (ADULT BLOOD PRESSURE CUFF LG) KIT        . dolutegravir (TIVICAY) 50 MG tablet Take 1 tablet (50 mg total) by mouth daily.  30 tablet  6  . emtricitabine-tenofovir (TRUVADA) 200-300 MG per tablet Take 1 tablet by mouth daily.  30 tablet  6   No current facility-administered medications on file prior to visit.   Active Ambulatory Problems    Diagnosis Date Noted  . AIDS 10/26/2006  . 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 HEPATITIS C 10/26/2006  . 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 HYPERTENSION, BENIGN 01/26/2007  . HX, PERSONAL, PENICILLIN ALLERGY 10/26/2006  . HX, PERSONAL, DRUG ALLERGY NOS 10/26/2006   Resolved Ambulatory Problems    Diagnosis Date Noted  . No Resolved Ambulatory Problems   Past Medical History  Diagnosis Date  . Hypertension   . HIV infection        Review of Systems  Constitutional: Negative for fever, chills, diaphoresis, activity change, appetite change, fatigue and unexpected weight change.  HENT: Negative for congestion, sore throat, rhinorrhea, sneezing, trouble swallowing and sinus pressure.  Eyes: Negative for photophobia and visual disturbance.  Respiratory: Negative for cough, chest tightness, shortness of breath, wheezing and stridor.  Cardiovascular: Negative  for chest pain, palpitations and leg swelling.  Gastrointestinal: Negative for nausea, vomiting, abdominal pain, diarrhea, constipation, blood in stool, abdominal distention and anal bleeding.  Genitourinary: Negative for dysuria, hematuria, flank pain and difficulty urinating.  Musculoskeletal: Negative for myalgias, back pain, joint swelling, arthralgias and gait problem.  Skin: Negative for color change, pallor, rash and wound.  Neurological: Negative for dizziness, tremors, weakness and light-headedness.  Hematological: Negative for adenopathy. Does not bruise/bleed easily.  Psychiatric/Behavioral: Negative for behavioral problems, confusion, sleep disturbance, dysphoric mood, decreased concentration and agitation.       Objective:   Physical Exam BP 127/79  Pulse 65  Temp(Src) 98.8 F (37.1 C) (Oral)  Wt 128 lb (58.06 kg) Physical Exam  Constitutional:  oriented to person, place, and time. appears well-developed and well-nourished. No distress.  HENT:  Mouth/Throat: Oropharynx is clear and moist. No oropharyngeal exudate.  Cardiovascular: Normal rate, regular rhythm and normal heart sounds. Exam reveals no gallop and no friction rub.  No murmur heard.  Pulmonary/Chest: Effort normal and breath sounds normal. No respiratory distress.  has no wheezes.  Abdominal: Soft. Bowel sounds are normal.  exhibits no distension. There is no tenderness.  Lymphadenopathy: no cervical adenopathy. Right axilla has increased fullness non tender not indurated in comparison to the left. Neurological: alert and oriented to person, place, and time.  Skin: Skin is warm and dry. No rash noted. No erythema.  Psychiatric: a normal mood and affect.  behavior is normal.        Assessment & Plan:  HIV = well controlled, continue with truvada/tivicay  Health maintenace = uptodate on women's health and vaccines  htn = at goal, continue with current medications

## 2014-01-06 ENCOUNTER — Telehealth: Payer: Self-pay | Admitting: *Deleted

## 2014-01-06 ENCOUNTER — Other Ambulatory Visit: Payer: Self-pay | Admitting: Internal Medicine

## 2014-01-06 NOTE — Telephone Encounter (Signed)
Lotensin HCT not covered by ADAP now.  Will need to change to another b/p rx on the [employer]AP formulary.

## 2014-01-16 ENCOUNTER — Telehealth: Payer: Self-pay

## 2014-01-16 DIAGNOSIS — I10 Essential (primary) hypertension: Secondary | ICD-10-CM

## 2014-01-16 NOTE — Telephone Encounter (Addendum)
Patient's blood pressure medicine is no longer covered with insurance and she would need to pay $62.00 .  Preferred regimen is lisinopril/hctz .  Can her medication be changed since she is not able to pay for the current Lotensin HCT  10-12.5 mg ?   Please advise.  Laverle Patter, RN

## 2014-01-17 MED ORDER — LISINOPRIL 10 MG PO TABS: 10.0000 mg | ORAL_TABLET | Freq: Every day | ORAL | Status: DC

## 2014-01-17 MED ORDER — HYDROCHLOROTHIAZIDE 12.5 MG PO CAPS: 12.5000 mg | ORAL_CAPSULE | Freq: Every day | ORAL | Status: DC

## 2014-01-17 MED ORDER — HYDROCHLOROTHIAZIDE 25 MG PO TABS: 25.0000 mg | ORAL_TABLET | Freq: Every day | ORAL | Status: DC

## 2014-01-17 NOTE — Addendum Note (Signed)
Addended by: Laverle Patter on: 01/17/2014 04:36 PM   Modules accepted: Orders, Medications

## 2014-01-17 NOTE — Telephone Encounter (Signed)
Medications sent to [facility].  Patient is to take Hctz 12.5 mg daily with lisinopril 10 mg daily.  The insurance should pay for this combination.

## 2014-01-17 NOTE — Telephone Encounter (Signed)
Can give her 2 separate rx through [facility]. hctz 12.5mg  daily, and lisinopril 10mg  daily. thx!

## 2014-02-02 ENCOUNTER — Other Ambulatory Visit: Payer: Self-pay | Admitting: Internal Medicine

## 2014-02-02 ENCOUNTER — Ambulatory Visit: Payer: Self-pay

## 2014-02-09 ENCOUNTER — Other Ambulatory Visit: Payer: Self-pay | Admitting: *Deleted

## 2014-02-09 DIAGNOSIS — B20 Human immunodeficiency virus [HIV] disease: Secondary | ICD-10-CM

## 2014-02-09 MED ORDER — DOLUTEGRAVIR SODIUM 50 MG PO TABS: 50.0000 mg | ORAL_TABLET | Freq: Every day | ORAL | Status: DC

## 2014-02-09 MED ORDER — EMTRICITABINE-TENOFOVIR DF 200-300 MG PO TABS: 1.0000 | ORAL_TABLET | Freq: Every day | ORAL | Status: DC

## 2014-02-09 NOTE — Telephone Encounter (Signed)
ADAP Application 

## 2014-03-11 ENCOUNTER — Other Ambulatory Visit: Payer: Self-pay | Admitting: Internal Medicine

## 2014-04-13 ENCOUNTER — Other Ambulatory Visit: Payer: Self-pay

## 2014-04-27 ENCOUNTER — Other Ambulatory Visit: Payer: Self-pay | Admitting: Internal Medicine

## 2014-04-27 ENCOUNTER — Ambulatory Visit: Payer: Self-pay | Admitting: Internal Medicine

## 2014-05-19 ENCOUNTER — Ambulatory Visit: Payer: Self-pay | Attending: Internal Medicine

## 2014-05-29 ENCOUNTER — Encounter: Payer: Self-pay | Admitting: Internal Medicine

## 2014-05-30 ENCOUNTER — Other Ambulatory Visit: Payer: Self-pay | Admitting: Internal Medicine

## 2014-06-13 ENCOUNTER — Other Ambulatory Visit (INDEPENDENT_AMBULATORY_CARE_PROVIDER_SITE_OTHER): Payer: Self-pay

## 2014-06-13 ENCOUNTER — Ambulatory Visit: Payer: Self-pay | Admitting: *Deleted

## 2014-06-13 ENCOUNTER — Ambulatory Visit (INDEPENDENT_AMBULATORY_CARE_PROVIDER_SITE_OTHER): Payer: Self-pay | Admitting: *Deleted

## 2014-06-13 DIAGNOSIS — Z23 Encounter for immunization: Secondary | ICD-10-CM

## 2014-06-13 DIAGNOSIS — B20 Human immunodeficiency virus [HIV] disease: Secondary | ICD-10-CM

## 2014-06-13 LAB — CBC WITH DIFFERENTIAL/PLATELET
BASOS PCT: 0 % (ref 0–1)
Basophils Absolute: 0 10*3/uL (ref 0.0–0.1)
EOS ABS: 0.1 10*3/uL (ref 0.0–0.7)
Eosinophils Relative: 2 % (ref 0–5)
HCT: 37.9 % (ref 36.0–46.0)
Hemoglobin: 12.8 g/dL (ref 12.0–15.0)
Lymphocytes Relative: 37 % (ref 12–46)
Lymphs Abs: 1.9 10*3/uL (ref 0.7–4.0)
MCH: 27.6 pg (ref 26.0–34.0)
MCHC: 33.8 g/dL (ref 30.0–36.0)
MCV: 81.7 fL (ref 78.0–100.0)
MPV: 9.5 fL (ref 9.4–12.4)
Monocytes Absolute: 0.5 10*3/uL (ref 0.1–1.0)
Monocytes Relative: 9 % (ref 3–12)
NEUTROS PCT: 52 % (ref 43–77)
Neutro Abs: 2.6 10*3/uL (ref 1.7–7.7)
PLATELETS: 198 10*3/uL (ref 150–400)
RBC: 4.64 MIL/uL (ref 3.87–5.11)
RDW: 14.8 % (ref 11.5–15.5)
WBC: 5 10*3/uL (ref 4.0–10.5)

## 2014-06-13 LAB — COMPREHENSIVE METABOLIC PANEL
ALK PHOS: 80 U/L (ref 39–117)
ALT: 20 U/L (ref 0–35)
AST: 26 U/L (ref 0–37)
Albumin: 4.1 g/dL (ref 3.5–5.2)
BILIRUBIN TOTAL: 0.3 mg/dL (ref 0.2–1.2)
BUN: 12 mg/dL (ref 6–23)
CO2: 29 mEq/L (ref 19–32)
Calcium: 9.4 mg/dL (ref 8.4–10.5)
Chloride: 104 mEq/L (ref 96–112)
Creat: 1.19 mg/dL — ABNORMAL HIGH (ref 0.50–1.10)
GLUCOSE: 89 mg/dL (ref 70–99)
Potassium: 4.4 mEq/L (ref 3.5–5.3)
SODIUM: 141 meq/L (ref 135–145)
Total Protein: 7.1 g/dL (ref 6.0–8.3)

## 2014-06-14 LAB — HIV-1 RNA QUANT-NO REFLEX-BLD: HIV 1 RNA Quant: <20 copies/mL (ref <20)

## 2014-06-14 LAB — T-HELPER CELL (CD4) - (RCID CLINIC ONLY)
CD4 % Helper T Cell: 23 % — ABNORMAL LOW (ref 33–55)
CD4 T Cell Abs: 430 /uL (ref 400–2700)

## 2014-06-27 ENCOUNTER — Ambulatory Visit (INDEPENDENT_AMBULATORY_CARE_PROVIDER_SITE_OTHER): Payer: Self-pay | Admitting: Internal Medicine

## 2014-06-27 ENCOUNTER — Encounter: Payer: Self-pay | Admitting: Internal Medicine

## 2014-06-27 VITALS — BP 124/79 | HR 63 | Temp 98.2°F | Wt 130.0 lb

## 2014-06-27 DIAGNOSIS — Z21 Asymptomatic human immunodeficiency virus [HIV] infection status: Secondary | ICD-10-CM

## 2014-06-27 NOTE — Progress Notes (Signed)
Patient ID: Veronica Robles, female   DOB: [birth year], 58 y.o.   MRN: [id number]  HPI Veronica Robles is a 58yo F with HIV disease, HTN, well controlled on tivicay/truvada, CD 4 count 430/VL <20. In spring 2015, she had breast biopsy with path Being benign due to abn mammo. She is doing well otherwise.no recent illnesses  Outpatient Encounter Prescriptions as of 06/27/2014  Medication Sig  . Blood Pressure Monitoring (ADULT BLOOD PRESSURE CUFF LG) KIT    . dolutegravir (TIVICAY) 50 MG tablet Take 1 tablet (50 mg total) by mouth daily.  Marland Kitchen emtricitabine-tenofovir (TRUVADA) 200-300 MG per tablet Take 1 tablet by mouth daily.  . hydrochlorothiazide (MICROZIDE) 12.5 MG capsule TAKE ONE CAPSULE BY MOUTH ONCE DAILY  . lisinopril (PRINIVIL) 10 MG tablet Take 1 tablet (10 mg total) by mouth daily.  . [DISCONTINUED] TIVICAY 50 MG tablet TAKE 1 TABLET BY MOUTH EVERY DAY  . [DISCONTINUED] TRUVADA 200-300 MG per tablet TAKE 1 TABLET BY MOUTH DAILY     Patient Active Problem List   Diagnosis Date Noted  . 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 HYPERTENSION, BENIGN 01/26/2007  . 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 AIDS 10/26/2006  . HEPATITIS C 10/26/2006  . HX, PERSONAL, PENICILLIN ALLERGY 10/26/2006  . HX, PERSONAL, DRUG ALLERGY NOS 10/26/2006     Health Maintenance Due  Topic Date Due  . COLONOSCOPY  10/19/2005  . TETANUS/TDAP  04/27/2012     Review of Systems  Constitutional: Negative for fever, chills, diaphoresis, activity change, appetite change, fatigue and unexpected weight change.  HENT: Negative for congestion, sore throat, rhinorrhea, sneezing, trouble swallowing and sinus pressure.  Eyes: Negative for photophobia and visual disturbance.  Respiratory: Negative for cough, chest tightness, shortness of breath, wheezing and stridor.  Cardiovascular: Negative for chest pain, palpitations and leg swelling.  Gastrointestinal: Negative for nausea,  vomiting, abdominal pain, diarrhea, constipation, blood in stool, abdominal distention and anal bleeding.  Genitourinary: Negative for dysuria, hematuria, flank pain and difficulty urinating.  Musculoskeletal: Negative for myalgias, back pain, joint swelling, arthralgias and gait problem.  Skin: Negative for color change, pallor, rash and wound.  Neurological: Negative for dizziness, tremors, weakness and light-headedness.  Hematological: Negative for adenopathy. Does not bruise/bleed easily.  Psychiatric/Behavioral: Negative for behavioral problems, confusion, sleep disturbance, dysphoric mood, decreased concentration and agitation.    Physical Exam   BP 124/79 mmHg  Pulse 63  Temp(Src) 98.2 F (36.8 C) (Oral)  Wt 130 lb (58.968 kg) Physical Exam  Constitutional:  oriented to person, place, and time. appears well-developed and well-nourished. No distress.  HENT:  Mouth/Throat: Oropharynx is clear and moist. No oropharyngeal exudate.  Cardiovascular: Normal rate, regular rhythm and normal heart sounds. Exam reveals no gallop and no friction rub.  No murmur heard.  Pulmonary/Chest: Effort normal and breath sounds normal. No respiratory distress.  has no wheezes.  Abdominal: Soft. Bowel sounds are normal.  exhibits no distension. There is no tenderness.  Lymphadenopathy: no cervical adenopathy.  Neurological: alert and oriented to person, place, and time.  Skin: Skin is warm and dry. No rash noted. No erythema.  Psychiatric: a normal mood and affect. His behavior is normal.   Lab Results  Component Value Date   CD4TCELL 23* 06/13/2014   Lab Results  Component Value Date   CD4TABS 430 06/13/2014   CD4TABS 430 12/05/2013   CD4TABS 370* 09/06/2013   Lab Results  Component Value Date   HIV1RNAQUANT <20 06/13/2014   Lab  Results  Component Value Date   HEPBSAB INDETER* 08/08/2009   No results found for: RPR  CBC Lab Results  Component Value Date   WBC 5.0 06/13/2014   RBC  4.64 06/13/2014   HGB 12.8 06/13/2014   HCT 37.9 06/13/2014   PLT 198 06/13/2014   MCV 81.7 06/13/2014   MCH 27.6 06/13/2014   MCHC 33.8 06/13/2014   RDW 14.8 06/13/2014   LYMPHSABS 1.9 06/13/2014   MONOABS 0.5 06/13/2014   EOSABS 0.1 06/13/2014   BASOSABS 0.0 06/13/2014   BMET Lab Results  Component Value Date   NA 141 06/13/2014   K 4.4 06/13/2014   CL 104 06/13/2014   CO2 29 06/13/2014   GLUCOSE 89 06/13/2014   BUN 12 06/13/2014   CREATININE 1.19* 06/13/2014   CALCIUM 9.4 06/13/2014   GFRNONAA 54* 12/05/2013   GFRAA 62 12/05/2013   Assessment and Plan  HIV= well controlled, continue on truvada/tivicay  Health maintenance = will need to get bilateral mammo in February  htn = well controlled

## 2014-07-03 ENCOUNTER — Other Ambulatory Visit: Payer: Self-pay | Admitting: Internal Medicine

## 2014-07-03 DIAGNOSIS — I10 Essential (primary) hypertension: Secondary | ICD-10-CM

## 2014-07-04 ENCOUNTER — Other Ambulatory Visit: Payer: Self-pay | Admitting: Internal Medicine

## 2014-07-04 ENCOUNTER — Other Ambulatory Visit: Payer: Self-pay | Admitting: *Deleted

## 2014-07-04 DIAGNOSIS — I10 Essential (primary) hypertension: Secondary | ICD-10-CM

## 2014-07-04 MED ORDER — LISINOPRIL 10 MG PO TABS: 10.0000 mg | ORAL_TABLET | Freq: Every day | ORAL | Status: DC

## 2014-08-04 ENCOUNTER — Other Ambulatory Visit: Payer: Self-pay | Admitting: Internal Medicine

## 2014-08-04 DIAGNOSIS — Z1231 Encounter for screening mammogram for malignant neoplasm of breast: Secondary | ICD-10-CM

## 2014-08-11 ENCOUNTER — Ambulatory Visit (HOSPITAL_COMMUNITY): Payer: Self-pay

## 2014-08-12 ENCOUNTER — Other Ambulatory Visit: Payer: Self-pay | Admitting: Internal Medicine

## 2014-08-12 DIAGNOSIS — B20 Human immunodeficiency virus [HIV] disease: Secondary | ICD-10-CM

## 2014-08-18 ENCOUNTER — Ambulatory Visit (HOSPITAL_COMMUNITY): Payer: Self-pay

## 2014-09-08 ENCOUNTER — Ambulatory Visit (HOSPITAL_COMMUNITY): Payer: Self-pay

## 2014-09-28 ENCOUNTER — Ambulatory Visit (INDEPENDENT_AMBULATORY_CARE_PROVIDER_SITE_OTHER): Payer: Self-pay | Admitting: Internal Medicine

## 2014-09-28 ENCOUNTER — Encounter: Payer: Self-pay | Admitting: Internal Medicine

## 2014-09-28 VITALS — BP 135/84 | HR 65 | Temp 97.2°F | Wt 130.0 lb

## 2014-09-28 DIAGNOSIS — B20 Human immunodeficiency virus [HIV] disease: Secondary | ICD-10-CM

## 2014-09-28 DIAGNOSIS — R928 Other abnormal and inconclusive findings on diagnostic imaging of breast: Secondary | ICD-10-CM

## 2014-09-28 DIAGNOSIS — N6021 Fibroadenosis of right breast: Secondary | ICD-10-CM

## 2014-09-28 LAB — CBC WITH DIFFERENTIAL/PLATELET
BASOS PCT: 0 % (ref 0–1)
Basophils Absolute: 0 10*3/uL (ref 0.0–0.1)
EOS ABS: 0.1 10*3/uL (ref 0.0–0.7)
Eosinophils Relative: 2 % (ref 0–5)
HCT: 40.3 % (ref 36.0–46.0)
HEMOGLOBIN: 13.2 g/dL (ref 12.0–15.0)
LYMPHS ABS: 1.4 10*3/uL (ref 0.7–4.0)
Lymphocytes Relative: 30 % (ref 12–46)
MCH: 27.8 pg (ref 26.0–34.0)
MCHC: 32.8 g/dL (ref 30.0–36.0)
MCV: 85 fL (ref 78.0–100.0)
MPV: 9.7 fL (ref 8.6–12.4)
Monocytes Absolute: 0.5 10*3/uL (ref 0.1–1.0)
Monocytes Relative: 10 % (ref 3–12)
NEUTROS PCT: 58 % (ref 43–77)
Neutro Abs: 2.7 10*3/uL (ref 1.7–7.7)
Platelets: 189 10*3/uL (ref 150–400)
RBC: 4.74 MIL/uL (ref 3.87–5.11)
RDW: 14.4 % (ref 11.5–15.5)
WBC: 4.7 10*3/uL (ref 4.0–10.5)

## 2014-09-28 LAB — COMPLETE METABOLIC PANEL WITH GFR
ALBUMIN: 4.4 g/dL (ref 3.5–5.2)
ALK PHOS: 80 U/L (ref 39–117)
ALT: 15 U/L (ref 0–35)
AST: 23 U/L (ref 0–37)
BUN: 12 mg/dL (ref 6–23)
CALCIUM: 9.5 mg/dL (ref 8.4–10.5)
CHLORIDE: 105 meq/L (ref 96–112)
CO2: 29 meq/L (ref 19–32)
Creat: 1.07 mg/dL (ref 0.50–1.10)
GFR, EST AFRICAN AMERICAN: 66 mL/min
GFR, EST NON AFRICAN AMERICAN: 57 mL/min — AB
GLUCOSE: 85 mg/dL (ref 70–99)
Potassium: 4.1 mEq/L (ref 3.5–5.3)
SODIUM: 140 meq/L (ref 135–145)
TOTAL PROTEIN: 6.8 g/dL (ref 6.0–8.3)
Total Bilirubin: 0.3 mg/dL (ref 0.2–1.2)

## 2014-09-28 NOTE — Progress Notes (Signed)
Patient ID: Veronica Robles, female   DOB: 03-Feb-1956, 59 y.o.   MRN: [id number]       Patient ID: Veronica Robles, female   DOB: Dec 08, 1955, 59 y.o.   MRN: [id number]  HPI Veronica Robles is a 59yo F with HIV disease, CD 4 count of 430/VL<20, on truvada/tivicay. She is doing well with her medications. She states that since her breast lesion biopsy many months ago, she feels that her left axilla is fuller than right, non tender but occasional bothersome at her bra strap.  Outpatient Encounter Prescriptions as of 09/28/2014  Medication Sig  . Blood Pressure Monitoring (ADULT BLOOD PRESSURE CUFF LG) KIT    . hydrochlorothiazide (MICROZIDE) 12.5 MG capsule TAKE ONE CAPSULE BY MOUTH ONCE DAILY  . lisinopril (PRINIVIL) 10 MG tablet Take 1 tablet (10 mg total) by mouth daily.  Marland Kitchen lisinopril (PRINIVIL,ZESTRIL) 10 MG tablet TAKE ONE TABLET BY MOUTH ONCE DAILY  . TIVICAY 50 MG tablet TAKE 1 TABLET BY MOUTH DAILY  . TRUVADA 200-300 MG per tablet TAKE 1 TABLET BY MOUTH DAILY     Patient Active Problem List   Diagnosis Date Noted  . 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 HYPERTENSION, BENIGN 01/26/2007  . 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 AIDS 10/26/2006  . HEPATITIS C 10/26/2006  . HX, PERSONAL, PENICILLIN ALLERGY 10/26/2006  . HX, PERSONAL, DRUG ALLERGY NOS 10/26/2006     Health Maintenance Due  Topic Date Due  . COLONOSCOPY  10/19/2005  . TETANUS/TDAP  04/27/2012     Review of Systems 10 point ros is negative, other than what is mentioned in hpi  Physical Exam   BP 135/84 mmHg  Pulse 65  Temp(Src) 97.2 F (36.2 C) (Oral)  Wt 130 lb (58.968 kg) Physical Exam  Constitutional:  oriented to person, place, and time. appears well-developed and well-nourished. No distress.  HENT:  Mouth/Throat: Oropharynx is clear and moist. No oropharyngeal exudate.  Cardiovascular: Normal rate, regular rhythm and normal heart sounds. Exam reveals no gallop and no friction rub.    No murmur heard.  Axilla= no LAD, left ant axillary appears slightly asymmetric from right axilla.not tender, or warm or fluctuant Pulmonary/Chest: Effort normal and breath sounds normal. No respiratory distress.  has no wheezes.  Abdominal: Soft. Bowel sounds are normal.  exhibits no distension. There is no tenderness.  Lymphadenopathy: no cervical adenopathy.  Neurological: alert and oriented to person, place, and time.  Skin: Skin is warm and dry. No rash noted. No erythema.  Psychiatric: a normal mood and affect.  behavior is normal.   Lab Results  Component Value Date   CD4TCELL 23* 06/13/2014   Lab Results  Component Value Date   CD4TABS 430 06/13/2014   CD4TABS 430 12/05/2013   CD4TABS 370* 09/06/2013   Lab Results  Component Value Date   HIV1RNAQUANT <20 06/13/2014   Lab Results  Component Value Date   HEPBSAB INDETER* 08/08/2009   No results found for: RPR  CBC Lab Results  Component Value Date   WBC 5.0 06/13/2014   RBC 4.64 06/13/2014   HGB 12.8 06/13/2014   HCT 37.9 06/13/2014   PLT 198 06/13/2014   MCV 81.7 06/13/2014   MCH 27.6 06/13/2014   MCHC 33.8 06/13/2014   RDW 14.8 06/13/2014   LYMPHSABS 1.9 06/13/2014   MONOABS 0.5 06/13/2014   EOSABS 0.1 06/13/2014   BASOSABS 0.0 06/13/2014   BMET Lab Results  Component Value Date   NA 141 06/13/2014  K 4.4 06/13/2014   CL 104 06/13/2014   CO2 29 06/13/2014   GLUCOSE 89 06/13/2014   BUN 12 06/13/2014   CREATININE 1.19* 06/13/2014   CALCIUM 9.4 06/13/2014   GFRNONAA 54* 12/05/2013   GFRAA 62 12/05/2013     Assessment and Plan  hiv disease = labs today. Continue current regimen. Needs adap approval  Abn mammo = will need repeat imaging before next visit  Left axillary fullness = nontender but slight assymetry since her breast biopsy, that was benign. Unclear if this is of any significance vs. Poor healing from breast biopsy. With upcoming repeat imaging, area would also be seen with  mammography. Will continue to monitor on physical exam

## 2014-09-29 ENCOUNTER — Other Ambulatory Visit: Payer: Self-pay | Admitting: *Deleted

## 2014-09-29 DIAGNOSIS — B20 Human immunodeficiency virus [HIV] disease: Secondary | ICD-10-CM

## 2014-09-29 LAB — T-HELPER CELL (CD4) - (RCID CLINIC ONLY)
CD4 T CELL HELPER: 23 % — AB (ref 33–55)
CD4 T Cell Abs: 400 /uL (ref 400–2700)

## 2014-09-29 LAB — HIV-1 RNA QUANT-NO REFLEX-BLD
HIV 1 RNA Quant: <20 copies/mL (ref <20)
HIV-1 RNA Quant, Log: <1.3 {Log} (ref <1.30)

## 2014-09-29 MED ORDER — DOLUTEGRAVIR SODIUM 50 MG PO TABS
50.0000 mg | ORAL_TABLET | Freq: Every day | ORAL | Status: DC
Start: 2014-09-29 — End: 2015-01-23

## 2014-09-29 MED ORDER — EMTRICITABINE-TENOFOVIR DF 200-300 MG PO TABS: 1.0000 | ORAL_TABLET | Freq: Every day | ORAL | Status: DC

## 2014-09-29 NOTE — Telephone Encounter (Signed)
ADAP Application 

## 2014-10-04 ENCOUNTER — Other Ambulatory Visit: Payer: Self-pay | Admitting: Internal Medicine

## 2014-10-04 ENCOUNTER — Telehealth: Payer: Self-pay | Admitting: *Deleted

## 2014-10-04 DIAGNOSIS — Z1231 Encounter for screening mammogram for malignant neoplasm of breast: Secondary | ICD-10-CM

## 2014-10-04 NOTE — Telephone Encounter (Signed)
Patient aware of appt made for mammogram 10/26/14.

## 2014-10-26 ENCOUNTER — Ambulatory Visit (HOSPITAL_COMMUNITY)
Admission: RE | Admit: 2014-10-26 | Discharge: 2014-10-26 | Disposition: A | Discharge status: Home / Self Care | Payer: Self-pay | Source: Ambulatory Visit | Attending: Internal Medicine | Admitting: Internal Medicine

## 2014-10-26 DIAGNOSIS — Z1231 Encounter for screening mammogram for malignant neoplasm of breast: Secondary | ICD-10-CM

## 2015-01-04 ENCOUNTER — Ambulatory Visit: Payer: Self-pay | Admitting: Internal Medicine

## 2015-01-23 ENCOUNTER — Ambulatory Visit (INDEPENDENT_AMBULATORY_CARE_PROVIDER_SITE_OTHER): Payer: Self-pay | Admitting: Internal Medicine

## 2015-01-23 VITALS — BP 112/70 | HR 69 | Temp 98.1°F | Ht 62.0 in | Wt 129.0 lb

## 2015-01-23 DIAGNOSIS — N6021 Fibroadenosis of right breast: Secondary | ICD-10-CM

## 2015-01-23 DIAGNOSIS — I10 Essential (primary) hypertension: Secondary | ICD-10-CM

## 2015-01-23 DIAGNOSIS — B20 Human immunodeficiency virus [HIV] disease: Secondary | ICD-10-CM

## 2015-01-23 MED ORDER — ELVITEG-COBIC-EMTRICIT-TENOFAF 150-150-200-10 MG PO TABS: 1.0000 | ORAL_TABLET | Freq: Every day | ORAL | Status: DC

## 2015-01-23 NOTE — Progress Notes (Signed)
Patient ID: Veronica Robles, female   DOB: 03-Apr-1956, 59 y.o.   MRN: [id number]       Patient ID: Veronica Robles, female   DOB: 01-08-1956, 59 y.o.   MRN: [id number]  HPI 59yo F with HIV disease, cd 4 count of 400/VL<20 on truvada/tivicay. Doing well with her medications. Had mammo at end of march which was normal. She states that she has been in good state of health since last visit. She did have uri for 5 days that resolved on its own.  Outpatient Encounter Prescriptions as of 01/23/2015  Medication Sig  . Blood Pressure Monitoring (ADULT BLOOD PRESSURE CUFF LG) KIT    . dolutegravir (TIVICAY) 50 MG tablet Take 1 tablet (50 mg total) by mouth daily.  Marland Kitchen emtricitabine-tenofovir (TRUVADA) 200-300 MG per tablet Take 1 tablet by mouth daily.  . hydrochlorothiazide (MICROZIDE) 12.5 MG capsule TAKE ONE CAPSULE BY MOUTH ONCE DAILY  . lisinopril (PRINIVIL) 10 MG tablet Take 1 tablet (10 mg total) by mouth daily.  . [DISCONTINUED] lisinopril (PRINIVIL,ZESTRIL) 10 MG tablet TAKE ONE TABLET BY MOUTH ONCE DAILY   No facility-administered encounter medications on file as of 01/23/2015.     Patient Active Problem List   Diagnosis Date Noted  . 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 HYPERTENSION, BENIGN 01/26/2007  . 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 AIDS 10/26/2006  . HEPATITIS C 10/26/2006  . HX, PERSONAL, PENICILLIN ALLERGY 10/26/2006  . HX, PERSONAL, DRUG ALLERGY NOS 10/26/2006     Health Maintenance Due  Topic Date Due  . COLONOSCOPY  10/19/2005  . TETANUS/TDAP  04/27/2012  . PAP SMEAR  01/23/2015     Review of Systems 10 point ros is negative Physical Exam   BP 112/70 mmHg  Pulse 69  Temp(Src) 98.1 F (36.7 C) (Oral)  Ht '5\' 2"'  (1.575 m)  Wt 129 lb (58.514 kg)  BMI 23.59 kg/m2 Physical Exam  Constitutional:  oriented to person, place, and time. appears well-developed and well-nourished. No distress.  HENT: [place]/AT, PERRLA, no scleral  icterus Mouth/Throat: Oropharynx is clear and moist. No oropharyngeal exudate.  Cardiovascular: Normal rate, regular rhythm and normal heart sounds. Exam reveals no gallop and no friction rub.  No murmur heard.  Pulmonary/Chest: Effort normal and breath sounds normal. No respiratory distress.  has no wheezes.  Neck = supple, no nuchal rigidity Lymphadenopathy: no cervical adenopathy. No axillary adenopathy Skin: Skin is warm and dry. No rash noted. No erythema.   Lab Results  Component Value Date   CD4TCELL 23* 09/28/2014   Lab Results  Component Value Date   CD4TABS 400 09/28/2014   CD4TABS 430 06/13/2014   CD4TABS 430 12/05/2013   Lab Results  Component Value Date   HIV1RNAQUANT <20 09/28/2014   Lab Results  Component Value Date   HEPBSAB INDETER* 08/08/2009   No results found for: RPR  CBC Lab Results  Component Value Date   WBC 4.7 09/28/2014   RBC 4.74 09/28/2014   HGB 13.2 09/28/2014   HCT 40.3 09/28/2014   PLT 189 09/28/2014   MCV 85.0 09/28/2014   MCH 27.8 09/28/2014   MCHC 32.8 09/28/2014   RDW 14.4 09/28/2014   LYMPHSABS 1.4 09/28/2014   MONOABS 0.5 09/28/2014   EOSABS 0.1 09/28/2014   BASOSABS 0.0 09/28/2014   BMET Lab Results  Component Value Date   NA 140 09/28/2014   K 4.1 09/28/2014   CL 105 09/28/2014   CO2 29 09/28/2014   GLUCOSE  85 09/28/2014   BUN 12 09/28/2014   CREATININE 1.07 09/28/2014   CALCIUM 9.5 09/28/2014   GFRNONAA 57* 09/28/2014   GFRAA 66 09/28/2014     Assessment and Plan  hiv disease = will switch to genvoya. If pill size is difficult can consider tivicay plus odefsey  htn = well controled on current regimen  Breast lump s/p biopsy = repeat mammo is normal  rtc in 3 months

## 2015-01-25 ENCOUNTER — Other Ambulatory Visit: Payer: Self-pay | Admitting: Internal Medicine

## 2015-01-25 DIAGNOSIS — I10 Essential (primary) hypertension: Secondary | ICD-10-CM

## 2015-01-31 ENCOUNTER — Other Ambulatory Visit: Payer: Self-pay | Admitting: *Deleted

## 2015-01-31 DIAGNOSIS — B20 Human immunodeficiency virus [HIV] disease: Secondary | ICD-10-CM

## 2015-01-31 MED ORDER — ELVITEG-COBIC-EMTRICIT-TENOFAF 150-150-200-10 MG PO TABS: 1.0000 | ORAL_TABLET | Freq: Every day | ORAL | Status: DC

## 2015-01-31 NOTE — Telephone Encounter (Signed)
ADAP Application 

## 2015-04-04 NOTE — Progress Notes (Signed)
Notified [facility] by fax. Keylah Darwish M, RN 

## 2015-04-12 ENCOUNTER — Ambulatory Visit: Payer: Self-pay | Admitting: Internal Medicine

## 2015-05-24 ENCOUNTER — Encounter: Payer: Self-pay | Admitting: Internal Medicine

## 2015-05-24 ENCOUNTER — Ambulatory Visit (INDEPENDENT_AMBULATORY_CARE_PROVIDER_SITE_OTHER): Payer: Self-pay | Admitting: Internal Medicine

## 2015-05-24 VITALS — BP 131/81 | HR 69 | Temp 97.3°F | Wt 130.0 lb

## 2015-05-24 DIAGNOSIS — B20 Human immunodeficiency virus [HIV] disease: Secondary | ICD-10-CM

## 2015-05-24 DIAGNOSIS — Z79899 Other long term (current) drug therapy: Secondary | ICD-10-CM

## 2015-05-24 DIAGNOSIS — Z113 Encounter for screening for infections with a predominantly sexual mode of transmission: Secondary | ICD-10-CM

## 2015-05-24 DIAGNOSIS — I10 Essential (primary) hypertension: Secondary | ICD-10-CM

## 2015-05-24 DIAGNOSIS — Z23 Encounter for immunization: Secondary | ICD-10-CM

## 2015-05-24 LAB — CBC WITH DIFFERENTIAL/PLATELET
BASOS PCT: 0 % (ref 0–1)
Basophils Absolute: 0 10*3/uL (ref 0.0–0.1)
EOS ABS: 0.1 10*3/uL (ref 0.0–0.7)
Eosinophils Relative: 2 % (ref 0–5)
HCT: 38.7 % (ref 36.0–46.0)
Hemoglobin: 12.7 g/dL (ref 12.0–15.0)
Lymphocytes Relative: 36 % (ref 12–46)
Lymphs Abs: 1.7 10*3/uL (ref 0.7–4.0)
MCH: 27.8 pg (ref 26.0–34.0)
MCHC: 32.8 g/dL (ref 30.0–36.0)
MCV: 84.7 fL (ref 78.0–100.0)
MPV: 9.2 fL (ref 8.6–12.4)
Monocytes Absolute: 0.5 10*3/uL (ref 0.1–1.0)
Monocytes Relative: 10 % (ref 3–12)
Neutro Abs: 2.5 10*3/uL (ref 1.7–7.7)
Neutrophils Relative %: 52 % (ref 43–77)
Platelets: 211 10*3/uL (ref 150–400)
RBC: 4.57 MIL/uL (ref 3.87–5.11)
RDW: 14 % (ref 11.5–15.5)
WBC: 4.8 10*3/uL (ref 4.0–10.5)

## 2015-05-24 LAB — COMPLETE METABOLIC PANEL WITH GFR
ALBUMIN: 4.3 g/dL (ref 3.6–5.1)
ALT: 17 U/L (ref 6–29)
AST: 23 U/L (ref 10–35)
Alkaline Phosphatase: 73 U/L (ref 33–130)
BUN: 17 mg/dL (ref 7–25)
CHLORIDE: 102 mmol/L (ref 98–110)
CO2: 29 mmol/L (ref 20–31)
Calcium: 9.5 mg/dL (ref 8.6–10.4)
Creat: 1 mg/dL (ref 0.50–1.05)
GFR, EST AFRICAN AMERICAN: 71 mL/min (ref >=60)
GFR, EST NON AFRICAN AMERICAN: 62 mL/min (ref >=60)
GLUCOSE: 87 mg/dL (ref 65–99)
Potassium: 4.2 mmol/L (ref 3.5–5.3)
SODIUM: 139 mmol/L (ref 135–146)
Total Bilirubin: 0.3 mg/dL (ref 0.2–1.2)
Total Protein: 7.2 g/dL (ref 6.1–8.1)

## 2015-05-24 LAB — LIPID PANEL
CHOL/HDL RATIO: 4.8 ratio (ref <=5.0)
CHOLESTEROL: 244 mg/dL — AB (ref 125–200)
HDL: 51 mg/dL (ref >=46)
LDL CALC: 159 mg/dL — AB (ref <130)
TRIGLYCERIDES: 172 mg/dL — AB (ref <150)
VLDL: 34 mg/dL — AB (ref <30)

## 2015-05-24 NOTE — Progress Notes (Signed)
Patient ID: Veronica Robles, female   DOB: [birth year], 59 y.o.   MRN: [id number]      Rfv: hiv follow up  Patient ID: Veronica Robles, female   DOB: [birth year], 59 y.o.   MRN: [id number]  HPI Veronica Robles is a 59yo F with hiv disease, hx of CM. Doing well with her adherence. Denies any illness since last visit. Continues to be mindful of her diet  Outpatient Encounter Prescriptions as of 05/24/2015  Medication Sig  . elvitegravir-cobicistat-emtricitabine-tenofovir (GENVOYA) 150-150-200-10 MG TABS tablet Take 1 tablet by mouth daily with breakfast.  . lisinopril (PRINIVIL) 10 MG tablet Take 1 tablet (10 mg total) by mouth daily.  . Blood Pressure Monitoring (ADULT BLOOD PRESSURE CUFF LG) KIT    . hydrochlorothiazide (MICROZIDE) 12.5 MG capsule TAKE ONE CAPSULE BY MOUTH ONCE DAILY (Patient not taking: Reported on 05/24/2015)   No facility-administered encounter medications on file as of 05/24/2015.     Patient Active Problem List   Diagnosis Date Noted  . 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 HYPERTENSION, BENIGN 01/26/2007  . 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 AIDS 10/26/2006  . HX, PERSONAL, PENICILLIN ALLERGY 10/26/2006  . HX, PERSONAL, DRUG ALLERGY NOS 10/26/2006     Health Maintenance Due  Topic Date Due  . COLONOSCOPY  10/19/2005  . TETANUS/TDAP  04/27/2012  . PAP SMEAR  01/23/2015  . INFLUENZA VACCINE  02/26/2015     Review of Systems Review of Systems  Constitutional: Negative for fever, chills, diaphoresis, activity change, appetite change, fatigue and unexpected weight change.  HENT: Negative for congestion, sore throat, rhinorrhea, sneezing, trouble swallowing and sinus pressure.  Eyes: Negative for photophobia and visual disturbance.  Respiratory: Negative for cough, chest tightness, shortness of breath, wheezing and stridor.  Cardiovascular: Negative for chest pain, palpitations and leg swelling.  Gastrointestinal: Negative for nausea,  vomiting, abdominal pain, diarrhea, constipation, blood in stool, abdominal distention and anal bleeding.  Genitourinary: Negative for dysuria, hematuria, flank pain and difficulty urinating.  Musculoskeletal: Negative for myalgias, back pain, joint swelling, arthralgias and gait problem.  Skin: Negative for color change, pallor, rash and wound.  Neurological: Negative for dizziness, tremors, weakness and light-headedness.  Hematological: Negative for adenopathy. Does not bruise/bleed easily.  Psychiatric/Behavioral: Negative for behavioral problems, confusion, sleep disturbance, dysphoric mood, decreased concentration and agitation.    Physical Exam   BP 131/81 mmHg  Pulse 69  Temp(Src) 97.3 F (36.3 C) (Oral)  Wt 130 lb (58.968 kg) Physical Exam  Constitutional:  oriented to person, place, and time. appears well-developed and well-nourished. No distress.  HENT: [place]/AT, PERRLA, no scleral icterus Mouth/Throat: Oropharynx is clear and moist. No oropharyngeal exudate.  Cardiovascular: Normal rate, regular rhythm and normal heart sounds. Exam reveals no gallop and no friction rub.  No murmur heard.  Pulmonary/Chest: Effort normal and breath sounds normal. No respiratory distress.  has no wheezes.  Neck = supple, no nuchal rigidity Lymphadenopathy: no cervical adenopathy. No axillary adenopathy Neurological: alert and oriented to person, place, and time.  Skin: Skin is warm and dry. No rash noted. No erythema.  Psychiatric: a normal mood and affect.  behavior is normal.   Lab Results  Component Value Date   CD4TCELL 23* 09/28/2014   Lab Results  Component Value Date   CD4TABS 400 09/28/2014   CD4TABS 430 06/13/2014   CD4TABS 430 12/05/2013   Lab Results  Component Value Date   HIV1RNAQUANT <20 09/28/2014   Lab Results  Component Value Date  HEPBSAB INDETER* 08/08/2009   No results found for: RPR  CBC Lab Results  Component Value Date   WBC 4.7 09/28/2014   RBC 4.74  09/28/2014   HGB 13.2 09/28/2014   HCT 40.3 09/28/2014   PLT 189 09/28/2014   MCV 85.0 09/28/2014   MCH 27.8 09/28/2014   MCHC 32.8 09/28/2014   RDW 14.4 09/28/2014   LYMPHSABS 1.4 09/28/2014   MONOABS 0.5 09/28/2014   EOSABS 0.1 09/28/2014   BASOSABS 0.0 09/28/2014   BMET Lab Results  Component Value Date   NA 140 09/28/2014   K 4.1 09/28/2014   CL 105 09/28/2014   CO2 29 09/28/2014   GLUCOSE 85 09/28/2014   BUN 12 09/28/2014   CREATININE 1.07 09/28/2014   CALCIUM 9.5 09/28/2014   GFRNONAA 57* 09/28/2014   GFRAA 66 09/28/2014     Assessment and Plan   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 Continue on genvoya. Well controlled  Health miantenance = received flu shot; she has upcoming mammo due in the spring. Post -hysterectomy  htn = well controlled on lisinopril. No need to change

## 2015-05-25 LAB — T-HELPER CELL (CD4) - (RCID CLINIC ONLY)
CD4 T CELL ABS: 410 /uL (ref 400–2700)
CD4 T CELL HELPER: 23 % — AB (ref 33–55)

## 2015-05-25 LAB — RPR

## 2015-05-25 LAB — HIV-1 RNA QUANT-NO REFLEX-BLD
HIV 1 RNA Quant: 44 copies/mL — ABNORMAL HIGH (ref <20)
HIV-1 RNA Quant, Log: 1.64 Log copies/mL — ABNORMAL HIGH (ref <1.30)

## 2015-07-19 ENCOUNTER — Other Ambulatory Visit: Payer: Self-pay | Admitting: Internal Medicine

## 2015-08-22 ENCOUNTER — Other Ambulatory Visit: Payer: Self-pay | Admitting: Internal Medicine

## 2015-08-24 ENCOUNTER — Other Ambulatory Visit: Payer: Self-pay | Admitting: Internal Medicine

## 2015-09-24 ENCOUNTER — Other Ambulatory Visit: Payer: Self-pay | Admitting: Internal Medicine

## 2015-09-26 ENCOUNTER — Telehealth: Payer: Self-pay | Admitting: Internal Medicine

## 2015-09-26 NOTE — Telephone Encounter (Signed)
Patient left a vm on the medication assistance line checking status of her refill for blood pressure medication.

## 2015-09-26 NOTE — Telephone Encounter (Signed)
Clarified hypertension regimen with patient.  Per last office note, she is supposed to be on lisinopril alone. Patient verbalized understanding. Landis Gandy, RN

## 2015-10-08 ENCOUNTER — Other Ambulatory Visit: Payer: Self-pay

## 2015-10-30 ENCOUNTER — Other Ambulatory Visit (INDEPENDENT_AMBULATORY_CARE_PROVIDER_SITE_OTHER): Payer: Self-pay

## 2015-10-30 DIAGNOSIS — B20 Human immunodeficiency virus [HIV] disease: Secondary | ICD-10-CM

## 2015-10-30 LAB — CBC WITH DIFFERENTIAL/PLATELET
BASOS PCT: 0 %
Basophils Absolute: 0 cells/uL (ref 0–200)
EOS ABS: 56 {cells}/uL (ref 15–500)
Eosinophils Relative: 1 %
HEMATOCRIT: 39.5 % (ref 35.0–45.0)
Hemoglobin: 12.8 g/dL (ref 11.7–15.5)
LYMPHS PCT: 26 %
Lymphs Abs: 1456 cells/uL (ref 850–3900)
MCH: 27.6 pg (ref 27.0–33.0)
MCHC: 32.4 g/dL (ref 32.0–36.0)
MCV: 85.3 fL (ref 80.0–100.0)
MONO ABS: 672 {cells}/uL (ref 200–950)
MPV: 9.8 fL (ref 7.5–12.5)
Monocytes Relative: 12 %
NEUTROS PCT: 61 %
Neutro Abs: 3416 cells/uL (ref 1500–7800)
Platelets: 191 10*3/uL (ref 140–400)
RBC: 4.63 MIL/uL (ref 3.80–5.10)
RDW: 14.6 % (ref 11.0–15.0)
WBC: 5.6 10*3/uL (ref 3.8–10.8)

## 2015-10-30 LAB — LIPID PANEL
CHOLESTEROL: 212 mg/dL — AB (ref 125–200)
HDL: 47 mg/dL (ref >=46)
LDL Cholesterol: 128 mg/dL (ref <130)
Total CHOL/HDL Ratio: 4.5 Ratio (ref <=5.0)
Triglycerides: 183 mg/dL — ABNORMAL HIGH (ref <150)
VLDL: 37 mg/dL — AB (ref <30)

## 2015-10-30 LAB — COMPLETE METABOLIC PANEL WITH GFR
ALBUMIN: 3.9 g/dL (ref 3.6–5.1)
ALK PHOS: 69 U/L (ref 33–130)
ALT: 13 U/L (ref 6–29)
AST: 20 U/L (ref 10–35)
BILIRUBIN TOTAL: 0.3 mg/dL (ref 0.2–1.2)
BUN: 12 mg/dL (ref 7–25)
CALCIUM: 9.1 mg/dL (ref 8.6–10.4)
CHLORIDE: 105 mmol/L (ref 98–110)
CO2: 31 mmol/L (ref 20–31)
CREATININE: 0.9 mg/dL (ref 0.50–0.99)
GFR, EST AFRICAN AMERICAN: 80 mL/min (ref >=60)
GFR, Est Non African American: 70 mL/min (ref >=60)
Glucose, Bld: 81 mg/dL (ref 65–99)
Potassium: 4.2 mmol/L (ref 3.5–5.3)
Sodium: 141 mmol/L (ref 135–146)
Total Protein: 6.9 g/dL (ref 6.1–8.1)

## 2015-10-31 LAB — T-HELPER CELL (CD4) - (RCID CLINIC ONLY)
CD4 % Helper T Cell: 20 % — ABNORMAL LOW (ref 33–55)
CD4 T CELL ABS: 320 /uL — AB (ref 400–2700)

## 2015-10-31 LAB — HIV-1 RNA QUANT-NO REFLEX-BLD
HIV 1 RNA Quant: 237 copies/mL — ABNORMAL HIGH (ref <20)
HIV-1 RNA QUANT, LOG: 2.37 {Log_copies}/mL — AB (ref <1.30)

## 2015-10-31 LAB — RPR

## 2015-11-06 ENCOUNTER — Other Ambulatory Visit: Payer: Self-pay

## 2015-11-14 ENCOUNTER — Other Ambulatory Visit: Payer: Self-pay | Admitting: Internal Medicine

## 2015-11-14 DIAGNOSIS — Z1231 Encounter for screening mammogram for malignant neoplasm of breast: Secondary | ICD-10-CM

## 2015-11-20 ENCOUNTER — Ambulatory Visit
Admission: RE | Admit: 2015-11-20 | Discharge: 2015-11-20 | Disposition: A | Discharge status: Home / Self Care | Payer: No Typology Code available for payment source | Source: Ambulatory Visit | Attending: Internal Medicine | Admitting: Internal Medicine

## 2015-11-20 ENCOUNTER — Encounter: Payer: Self-pay | Admitting: Internal Medicine

## 2015-11-20 ENCOUNTER — Ambulatory Visit (INDEPENDENT_AMBULATORY_CARE_PROVIDER_SITE_OTHER): Payer: Self-pay | Admitting: Internal Medicine

## 2015-11-20 VITALS — BP 137/84 | HR 69 | Temp 97.7°F | Wt 131.0 lb

## 2015-11-20 DIAGNOSIS — B20 Human immunodeficiency virus [HIV] disease: Secondary | ICD-10-CM

## 2015-11-20 DIAGNOSIS — Z1231 Encounter for screening mammogram for malignant neoplasm of breast: Secondary | ICD-10-CM

## 2015-11-20 NOTE — Progress Notes (Signed)
Patient ID: Veronica Robles, female   DOB: 08/18/1955, 60 y.o.   MRN: [id number]       Patient ID: Veronica Robles, female   DOB: 01/19/1956, 60 y.o.   MRN: [id number]  HPI 60yo F with hiv disease, hx of CM, CD 4 count of 320/VL 237 on genvoya. New right sided LN started to be noticeable roughly 2 weeks ago. She states that she thinks she had a cold approx 3-4 wk ago. Non tender, no fever, chills, but occ nightsweats she attributes to menopause. She works 20hr week at [employer] in [place]. She denies any missing doses  Ros: as stated above, weight is stable. 10 point ros is otherwise negative  Outpatient Encounter Prescriptions as of 11/20/2015  Medication Sig  . Blood Pressure Monitoring (ADULT BLOOD PRESSURE CUFF LG) KIT    . elvitegravir-cobicistat-emtricitabine-tenofovir (GENVOYA) 150-150-200-10 MG TABS tablet Take 1 tablet by mouth daily with breakfast.  . lisinopril (PRINIVIL,ZESTRIL) 10 MG tablet TAKE ONE TABLET BY MOUTH DAILY   No facility-administered encounter medications on file as of 11/20/2015.     Patient Active Problem List   Diagnosis Date Noted  . 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 HYPERTENSION, BENIGN 01/26/2007  . 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 AIDS 10/26/2006  . HX, PERSONAL, PENICILLIN ALLERGY 10/26/2006  . HX, PERSONAL, DRUG ALLERGY NOS 10/26/2006     Health Maintenance Due  Topic Date Due  . COLONOSCOPY  10/19/2005  . TETANUS/TDAP  04/27/2012  . PAP SMEAR  01/23/2015  . ZOSTAVAX  10/20/2015     Review of Systems  Physical Exam   BP 163/81 mmHg  Pulse 76  Temp(Src) 97.7 F (36.5 C) (Oral)  Wt 131 lb (59.421 kg) Physical Exam  Constitutional:  oriented to person, place, and time. appears well-developed and well-nourished. No distress.  HENT: [place]/AT, PERRLA, no scleral icterus Mouth/Throat: Oropharynx is clear and moist. No oropharyngeal exudate.  Cardiovascular: Normal rate, regular rhythm and normal  heart sounds. Exam reveals no gallop and no friction rub.  No murmur heard.  Pulmonary/Chest: Effort normal and breath sounds normal. No respiratory distress.  has no wheezes.  Neck = supple, no nuchal rigidity Abdominal: Soft. Bowel sounds are normal.  exhibits no distension. There is no tenderness.  Lymphadenopathy: no cervical adenopathy. No axillary adenopathy Neurological: alert and oriented to person, place, and time.  Skin: Skin is warm and dry. No rash noted. No erythema.  Psychiatric: a normal mood and affect.  behavior is normal.   Lab Results  Component Value Date   CD4TCELL 20* 10/30/2015   Lab Results  Component Value Date   CD4TABS 320* 10/30/2015   CD4TABS 410 05/24/2015   CD4TABS 400 09/28/2014   Lab Results  Component Value Date   HIV1RNAQUANT 237* 10/30/2015   Lab Results  Component Value Date   HEPBSAB INDETER* 08/08/2009   No results found for: RPR  CBC Lab Results  Component Value Date   WBC 5.6 10/30/2015   RBC 4.63 10/30/2015   HGB 12.8 10/30/2015   HCT 39.5 10/30/2015   PLT 191 10/30/2015   MCV 85.3 10/30/2015   MCH 27.6 10/30/2015   MCHC 32.4 10/30/2015   RDW 14.6 10/30/2015   LYMPHSABS 1456 10/30/2015   MONOABS 672 10/30/2015   EOSABS 56 10/30/2015   BASOSABS 0 10/30/2015   BMET Lab Results  Component Value Date   NA 141 10/30/2015   K 4.2 10/30/2015   CL 105 10/30/2015  CO2 31 10/30/2015   GLUCOSE 81 10/30/2015   BUN 12 10/30/2015   CREATININE 0.90 10/30/2015   CALCIUM 9.1 10/30/2015   GFRNONAA 70 10/30/2015   GFRAA 80 10/30/2015     Assessment and Plan hiv disease = will check viral load to see if she is gaining resistance or if this was an incidental viral blip  htn = previously well controlled on lisinopril 29m. Will recehck bp, sbp 137/84. At goal. No change   Lymphadenopathy = small 1.5cm round, mobile, nontender LN noted in posterior chain, below angle of jaw  Health maintenance = uptodate. Up for flu vaccine in  fall  rtc in 6-8 wk

## 2015-11-21 LAB — HIV-1 RNA QUANT-NO REFLEX-BLD
HIV 1 RNA QUANT: 319 {copies}/mL — AB (ref <20)
HIV-1 RNA QUANT, LOG: 2.5 {Log_copies}/mL — AB (ref <1.30)

## 2015-11-29 ENCOUNTER — Other Ambulatory Visit: Payer: Self-pay | Admitting: Internal Medicine

## 2015-11-29 DIAGNOSIS — I10 Essential (primary) hypertension: Secondary | ICD-10-CM

## 2016-01-01 ENCOUNTER — Encounter: Payer: Self-pay | Admitting: Internal Medicine

## 2016-01-01 ENCOUNTER — Ambulatory Visit (INDEPENDENT_AMBULATORY_CARE_PROVIDER_SITE_OTHER): Payer: Self-pay | Admitting: Internal Medicine

## 2016-01-01 VITALS — BP 149/82 | HR 64 | Temp 97.1°F | Wt 130.0 lb

## 2016-01-01 DIAGNOSIS — B20 Human immunodeficiency virus [HIV] disease: Secondary | ICD-10-CM

## 2016-01-01 NOTE — Progress Notes (Signed)
Patient ID: Veronica Robles, female   DOB: 1955-12-18, 60 y.o.   MRN: [id number]       Patient ID: Veronica Robles, female   DOB: [birth year], 60 y.o.   MRN: [id number]  HPI mackenzie groom is a 60yo F with history of HIV disease, CD 4 count of 320/VL 319.she reports that she has been taking her genvoya routlinely without missing doses. Prior to this regimen, she had been on stribild where she was undetectable up until this spring. By appearance of her genotype, she would be best served by being on pi based regimen. Concern that she has gained INSTI mutations   Her genotype from 2008: DRUG RESISTANCE MUTATIONS DETECTED:  Reverse Transcriptase Gene: T69N, K101E, Y181C, M184I, M184V,  G190A  Protease Gene:  NOVEL MUTATIONS DETECTED:  Reverse Transcriptase Gene: E28K, S48E, [id number], K166R, R211K,  L214F, R277K  Protease Gene: V3I, I13V, L63P, L63S, I72T, V77I Outpatient Encounter Prescriptions as of 01/01/2016  Medication Sig  . Blood Pressure Monitoring (ADULT BLOOD PRESSURE CUFF LG) KIT    . elvitegravir-cobicistat-emtricitabine-tenofovir (GENVOYA) 150-150-200-10 MG TABS tablet Take 1 tablet by mouth daily with breakfast.  . lisinopril (PRINIVIL,ZESTRIL) 10 MG tablet TAKE ONE TABLET BY MOUTH DAILY   No facility-administered encounter medications on file as of 01/01/2016.     Patient Active Problem List   Diagnosis Date Noted  . 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 HYPERTENSION, BENIGN 01/26/2007  . 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 AIDS 10/26/2006  . HX, PERSONAL, PENICILLIN ALLERGY 10/26/2006  . HX, PERSONAL, DRUG ALLERGY NOS 10/26/2006     Health Maintenance Due  Topic Date Due  . COLONOSCOPY  10/19/2005  . TETANUS/TDAP  04/27/2012  . PAP SMEAR  01/23/2015  . ZOSTAVAX  10/20/2015     Review of Systems  Constitutional: Negative for fever, chills, diaphoresis, activity change, appetite change, fatigue and unexpected weight  change.  HENT: Negative for congestion, sore throat, rhinorrhea, sneezing, trouble swallowing and sinus pressure.  Eyes: Negative for photophobia and visual disturbance.  Respiratory: Negative for cough, chest tightness, shortness of breath, wheezing and stridor.  Cardiovascular: Negative for chest pain, palpitations and leg swelling.  Gastrointestinal: Negative for nausea, vomiting, abdominal pain, diarrhea, constipation, blood in stool, abdominal distention and anal bleeding.  Genitourinary: Negative for dysuria, hematuria, flank pain and difficulty urinating.  Musculoskeletal: Negative for myalgias, back pain, joint swelling, arthralgias and gait problem.  Skin: Negative for color change, pallor, rash and wound.  Neurological: Negative for dizziness, tremors, weakness and light-headedness.  Hematological: Negative for adenopathy. Does not bruise/bleed easily.  Psychiatric/Behavioral: Negative for behavioral problems, confusion, sleep disturbance, dysphoric mood, decreased concentration and agitation.    Physical Exam  BP 149/82 mmHg  Pulse 64  Temp(Src) 97.1 F (36.2 C) (Oral)  Wt 130 lb (58.968 kg) Physical Exam  Constitutional:  oriented to person, place, and time. appears well-developed and well-nourished. No distress.  HENT: /AT, PERRLA, no scleral icterus Mouth/Throat: Oropharynx is clear and moist. No oropharyngeal exudate.  Cardiovascular: Normal rate, regular rhythm and normal heart sounds. Exam reveals no gallop and no friction rub.  No murmur heard.  Pulmonary/Chest: Effort normal and breath sounds normal. No respiratory distress.  has no wheezes.  Neck = supple, no nuchal rigidity Abdominal: Soft. Bowel sounds are normal.  exhibits no distension. There is no tenderness.  Lymphadenopathy: no cervical adenopathy. No axillary adenopathy Neurological: alert and oriented to person, place, and time.  Skin: Skin is warm and dry. No rash  noted. No erythema.  Psychiatric: a  normal mood and affect.  behavior is normal.    Lab Results  Component Value Date   CD4TCELL 20* 10/30/2015   Lab Results  Component Value Date   CD4TABS 320* 10/30/2015   CD4TABS 410 05/24/2015   CD4TABS 400 09/28/2014   Lab Results  Component Value Date   HIV1RNAQUANT 319* 11/20/2015   Lab Results  Component Value Date   HEPBSAB INDETER* 08/08/2009   No results found for: RPR  CBC Lab Results  Component Value Date   WBC 5.6 10/30/2015   RBC 4.63 10/30/2015   HGB 12.8 10/30/2015   HCT 39.5 10/30/2015   PLT 191 10/30/2015   MCV 85.3 10/30/2015   MCH 27.6 10/30/2015   MCHC 32.4 10/30/2015   RDW 14.6 10/30/2015   LYMPHSABS 1456 10/30/2015   MONOABS 672 10/30/2015   EOSABS 56 10/30/2015   BASOSABS 0 10/30/2015   BMET Lab Results  Component Value Date   NA 141 10/30/2015   K 4.2 10/30/2015   CL 105 10/30/2015   CO2 31 10/30/2015   GLUCOSE 81 10/30/2015   BUN 12 10/30/2015   CREATININE 0.90 10/30/2015   CALCIUM 9.1 10/30/2015   GFRNONAA 70 10/30/2015   GFRAA 80 10/30/2015     Assessment and Plan  hiv disease = detectable virus, despite good adherence.  In looking back in her chart. She had previously been on truvada-boosted atazanavir combination but switched to tivicay-truvada in 2012 and subsequently stribild. She had been surprisingly well controlled despite being off of pi based regimen until now. We will get genosure in order to determine her archived and new resistance that she may have acquired. We will ask her to stop taking genvoya until we get information back so that we may start her on new hiv regimen.  Spent 45 min with patient with greater than 50% in discussion management and adherence

## 2016-01-08 ENCOUNTER — Other Ambulatory Visit (INDEPENDENT_AMBULATORY_CARE_PROVIDER_SITE_OTHER): Payer: Self-pay

## 2016-01-08 DIAGNOSIS — B20 Human immunodeficiency virus [HIV] disease: Secondary | ICD-10-CM

## 2016-01-10 LAB — HIV-1 RNA QUANT-NO REFLEX-BLD
HIV 1 RNA QUANT: 180 {copies}/mL — AB (ref <20)
HIV-1 RNA QUANT, LOG: 2.26 {Log_copies}/mL — AB (ref <1.30)

## 2016-01-30 ENCOUNTER — Telehealth: Payer: Self-pay | Admitting: Internal Medicine

## 2016-01-30 NOTE — Telephone Encounter (Signed)
Spoke with her that we have answers from genotype. We have new regimen to start her on. She is coming to clinic tomorrow morning

## 2016-01-31 ENCOUNTER — Telehealth: Payer: Self-pay | Admitting: Internal Medicine

## 2016-01-31 ENCOUNTER — Ambulatory Visit: Payer: Self-pay | Admitting: Pharmacist

## 2016-01-31 DIAGNOSIS — B20 Human immunodeficiency virus [HIV] disease: Secondary | ICD-10-CM

## 2016-01-31 MED ORDER — DARUNAVIR ETHANOLATE 800 MG PO TABS: 800.0000 mg | ORAL_TABLET | Freq: Every evening | ORAL | Status: DC

## 2016-01-31 MED ORDER — ZIDOVUDINE 300 MG PO TABS: 300.0000 mg | ORAL_TABLET | Freq: Two times a day (BID) | ORAL | Status: DC

## 2016-01-31 MED ORDER — DOLUTEGRAVIR SODIUM 50 MG PO TABS: 50.0000 mg | ORAL_TABLET | Freq: Two times a day (BID) | ORAL | Status: DC

## 2016-01-31 MED ORDER — RITONAVIR 100 MG PO TABS: 100.0000 mg | ORAL_TABLET | Freq: Every evening | ORAL | Status: DC

## 2016-01-31 NOTE — Progress Notes (Signed)
HPI: ASHIRA BARSOUM is a 60 y.o. female presenting to the [place] pharmacy clinic to discuss her new genosure. She has a long history of HIV (dx in 1993) and has been on several regimens throughout the years.  She most recently was started on Genvoya and has been tolerating the medication well with no missed doses.  She has been compliant with recent bumps in viral load since October 2016.  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   Allergies: Allergies  Allergen Reactions  . Penicillins     REACTION: rash    Vitals:    Past Medical History: Past Medical History  Diagnosis Date  . Hypertension   . HIV infection [facility])     Social History: Social History   Social History  . Marital Status: Single    Spouse Name: N/A  . Number of Children: N/A  . Years of Education: N/A   Social History Main Topics  . Smoking status: Former Smoker -- 0.10 packs/day    Types: Cigarettes  . Smokeless tobacco: Never Used  . Alcohol Use: No  . Drug Use: No  . Sexual Activity: Not Currently    Birth Control/ Protection: Surgical   Other Topics Concern  . Not on file   Social History Narrative    Previous Regimen: Many  Current Regimen: Genvoya  Labs: HIV 1 RNA QUANT (copies/mL)  Date Value  01/08/2016 180*  11/20/2015 319*  10/30/2015 237*   CD4 T CELL ABS (/uL)  Date Value  10/30/2015 320*  05/24/2015 410  09/28/2014 400   HEP B S AB (no units)  Date Value  08/08/2009 INDETER*   HEPATITIS B SURFACE AG (no units)  Date Value  08/08/2009 NEG   HCV AB (no units)  Date Value  05/02/2013 NEGATIVE    CrCl: CrCl cannot be calculated (Unknown ideal weight.).  Lipids:    Component Value Date/Time   CHOL 212* 10/30/2015 1220   TRIG 183* 10/30/2015 1220   HDL 47 10/30/2015 1220   CHOLHDL 4.5 10/30/2015 1220   VLDL 37* 10/30/2015 1220   LDLCALC 128 10/30/2015 1220   HIV Genosure Composite Data Genotype Date: June 13th 2017  Mutations in [place] impact  drug susceptibility RT Mutations  V21I, S48E, K65R, S68S/G, T69N, K101E, Q102K, C162S, Y181C, M184V, G190A, R211T, A272P, R277K, V293I, M357T, K358R, K366R, A376T, A400T  PI Mutations L10L/I, N37S, L63P, I72T, V77I, G78G/E  Integrase Mutations K7K/R, S17N, V31I, V72I, V113I, S119P, T122I, T124A, T125T/A, I135V, V151I, N155H, K156N, I162V, V201I, T218S, D232N, V234L, D256E   Interpretation of Genotype Data per [employer] HIV Database and Genosure  Nucleoside RTIs  Abacavir - resistant Didanosine - resistant Emtricitabine - resistant Lamivudine - resistant Stavudine - sensitive Tenofovir - resistant Zidovudine - sensitive   Non-Nucleoside RTIs  Efavirenz - resistant Etravirine - resistant Nevirapine - resistant Rilpivirine - resistant   Protease Inhibitors  Atazanavir - sensitive Darunavir - sensitive Fosamprevir - sensitive Indinavir - sensitive Lopinavir - sensitive Nefinavir - sensitive Ritonavir - sensitive Saquinavir - sensitive Tipranavir - sensitive   Integrase Inhibitors  Dolutegravir - possible low-level resistance Elvitegravir - resistant Raltegravir - resistant    Assessment: 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 She presents today to discuss options for switching her medications.  She is fairly upset that she will have to switch to multiple pills a day versus the one pill once a daily Genvoya she has been taking recently.  I explained her genosure to her and showed her the different  medications that are now resistant.  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  The N155H causes potentially low-level resistance for Tivicay for patients who are raltegravir-experience, which she is.  Will also put her on Retrovir BID as the K165R mutation actually makes patients more sensitive to Retrovir and causes it to work better. Finally, will put her on Prezista/norvir.  I gave her the option on taking Prezcobix vs Prezista/norvir and  due to the size of Prezcobix, she decided she would rather take Prezista/norvir. I made a medication calendar for her and gave her a large pillbox key chain.  She understands the importance of remaining compliant and will call the [facility] if she has any issues. I made her an appt for labs as well as another appt with [place] in 1 month.  Recommendations: - Stop Genvoya - Take Prezista 800 mg PO daily - Take Norvir 100 mg PO daily - Take Tivicay 50 mg PO BID - Take Retrovir 300 mg PO BID - Follow-up appt with labs and [facility] pharmacist on 8/8 at 10am - Follow-up appt with Dr. Baxter Flattery on 8/24 at [place]. Donnajean Lopes, PharmD [facility] 01/31/2016, 11:29 AM

## 2016-01-31 NOTE — Telephone Encounter (Signed)
Discussed with ID pharmacist to have patient come into clinic today for review, adherence counseling and initiation of new hiv regimen  Based on new genotype: 1) dolutegravir BID 2) prezcobix  3) zidovudine

## 2016-02-05 ENCOUNTER — Ambulatory Visit: Payer: No Typology Code available for payment source | Admitting: Internal Medicine

## 2016-02-12 ENCOUNTER — Telehealth: Payer: Self-pay | Admitting: Pharmacist

## 2016-02-12 NOTE — Telephone Encounter (Signed)
Veronica Robles called today and said she was sick this morning and threw up.  She thinks it might have been something she ate. She feels a lot better since throwing up. She hadn't taken her morning HIV medications yet and wanted to know if she should wait to take then. I instructed her to wait ~1 hour after being sick to make sure she did not get sick again and then take her HIV medications. Instructed her to call me back if she is nauseous or started vomiting again.

## 2016-02-14 ENCOUNTER — Other Ambulatory Visit: Payer: Self-pay | Admitting: Pharmacist Clinician (PhC)/ Clinical Pharmacy Specialist

## 2016-02-14 ENCOUNTER — Telehealth: Payer: Self-pay | Admitting: Pharmacist Clinician (PhC)/ Clinical Pharmacy Specialist

## 2016-02-14 MED ORDER — ONDANSETRON HCL 4 MG PO TABS: 4.0000 mg | ORAL_TABLET | Freq: Three times a day (TID) | ORAL | Status: DC | PRN

## 2016-02-14 NOTE — Telephone Encounter (Signed)
Veronica Robles called back about her nausea issue. She has been off of ART since tues due to that. This is likely not meds related since she has been on it first week of July. Going to send about 20 tabs of zofran to be used as PRN. She is going to restart back tomorrow.

## 2016-02-18 ENCOUNTER — Telehealth: Payer: Self-pay | Admitting: *Deleted

## 2016-02-18 NOTE — Telephone Encounter (Signed)
Patient called requesting an appt due to a rash she believes to be caused by her new regimen. She has stopped the meds, stating that the pharmacist at [facility] looked at her rash and felt it may be due to new medication. Appt scheduled with our pharmacist for tomorrow.

## 2016-02-19 ENCOUNTER — Ambulatory Visit: Payer: Self-pay | Admitting: Pharmacist

## 2016-02-19 DIAGNOSIS — B20 Human immunodeficiency virus [HIV] disease: Secondary | ICD-10-CM

## 2016-02-19 MED ORDER — ATAZANAVIR-COBICISTAT 300-150 MG PO TABS: 1.0000 | ORAL_TABLET | Freq: Every day | ORAL | 3 refills | Status: DC

## 2016-02-19 NOTE — Progress Notes (Signed)
HPI: Veronica Robles is a 60 y.o. female who presents to the [facility] today for follow up of some issues she is having with her HIV medications.  Betzaida was recently switched from [place] to Prezista/norvir + Tivicay BID + Retrovir BID around July 6th. She called the [facility] pharmacy on July 18th with issues with vomiting and then again on July 20th with vomiting issues.  At this time, an Rx for Zofran was sent for her.  Shortly after, she went to [facility] to pick up the nausea medication and noticed a rash/hives on both of her knees, thighs, and breasts.  She showed the [facility] pharmacist and he instructed her to stop taking her HIV medications.  She tells me today that she hasn't taken anything in ~4 days. She states she tolerates the two small pills (Tivicay and Retrovir) ok and thinks her issues are with the Prezista.   Allergies: Allergies  Allergen Reactions  . Penicillins     REACTION: rash    Past Medical History: Past Medical History:  Diagnosis Date  . HIV infection ([place])   . Hypertension     Social History: Social History   Social History  . Marital status: Single    Spouse name: N/A  . Number of children: N/A  . Years of education: N/A   Social History Main Topics  . Smoking status: Former Smoker    Packs/day: 0.10    Types: Cigarettes  . Smokeless tobacco: Never Used  . Alcohol use No  . Drug use: No  . Sexual activity: Not Currently    Birth control/ protection: Surgical   Other Topics Concern  . Not on file   Social History Narrative  . No narrative on file    Current Regimen: Tivicay BID + Retrovir BID + Prezist/norvir daily  Labs: HIV 1 RNA Quant (copies/mL)  Date Value  01/08/2016 180 (H)  11/20/2015 319 (H)  10/30/2015 237 (H)   CD4 T Cell Abs (/uL)  Date Value  10/30/2015 320 (L)  05/24/2015 410  09/28/2014 400   Hep B S Ab (no units)  Date Value  08/08/2009 INDETER (A)   Hepatitis B Surface Ag (no units)  Date Value   08/08/2009 NEG   HCV Ab (no units)  Date Value  05/02/2013 NEGATIVE    CrCl: CrCl cannot be calculated (Unknown ideal weight.).  Lipids:    Component Value Date/Time   CHOL 212 (H) 10/30/2015 1220   TRIG 183 (H) 10/30/2015 1220   HDL 47 10/30/2015 1220   CHOLHDL 4.5 10/30/2015 1220   VLDL 37 (H) 10/30/2015 1220   LDLCALC 128 10/30/2015 1220    HIV Genosure Composite Data Genotype Date: June 13th 2017  Mutations in [place] impact drug susceptibility RT Mutations  V21I, S48E, K65R, S68S/G, T69N, K101E, Q102K, C162S, Y181C, M184V, G190A, R211T, A272P, R277K, V293I, M357T, K358R, K366R, A376T, A400T  PI Mutations L10L/I, N37S, L63P, I72T, V77I, G78G/E  Integrase Mutations K7K/R, S17N, V31I, V72I, V113I, S119P, T122I, T124A, T125T/A, I135V, V151I, N155H, K156N, I162V, V201I, T218S, D232N, V234L, D256E   Interpretation of Genotype Data per Stanford HIV Database and Genosure  Nucleoside RTIs  Abacavir - resistant Didanosine - resistant Emtricitabine - resistant Lamivudine - resistant Stavudine - sensitive Tenofovir - resistant Zidovudine - sensitive   Non-Nucleoside RTIs  Efavirenz - resistant Etravirine - resistant Nevirapine - resistant Rilpivirine - resistant   Protease Inhibitors  Atazanavir - sensitive Darunavir - sensitive Fosamprevir - sensitive Indinavir - sensitive Lopinavir - sensitive  Nefinavir - sensitive Ritonavir - sensitive Saquinavir - sensitive Tipranavir - sensitive   Integrase Inhibitors  Dolutegravir - possible low-level resistance Elvitegravir - resistant Raltegravir - resistant    Assessment: Cleola's rash has resolved since stopping her HIV medications.  We reviewed her resistance profile again, and I spoke with Dr. Baxter Flattery.  After reviewing different medications and discussing with the patient, we have decided to switch from Prezista/norvir to [place]. She seemed to tolerate the cobicistat in [place] well and has been on atazanavir  in the past. She is agreeable to this and will try switching.  I will call her on Friday for a follow-up phone call.   Plans: - Stop Prezista/norvir - Start Evotaz 1 tablet PO daily with food - Continue taking Tivicay 50 mg PO BID and Retrovir 300 mg PO BID - F/u with Dr. Baxter Flattery 8/24 at [place]. Donnajean Lopes, PharmD Infectious [facility] 02/19/2016, 11:46 AM

## 2016-02-22 NOTE — Telephone Encounter (Signed)
We have changed her medications and has met with cassie for adherence counseling.

## 2016-02-25 ENCOUNTER — Telehealth: Payer: Self-pay | Admitting: Pharmacist

## 2016-02-25 NOTE — Telephone Encounter (Signed)
Called Veronica Robles this morning to see how she was doing on the [place], Tivicay, and Retrovir.  She states that she is doing pretty good.  She has not broken out into a rash since stopping the Prezista/norvir.  She has not thrown up anymore either.  She does state she is a little nauseated, and I instructed her to take a Zofran if that continues.  She understands and will call the clinic if she has any issues.

## 2016-02-25 NOTE — Telephone Encounter (Signed)
Patient left message in triage stating that the medication is going ok. She is experiencing some nausea, but states that overall she is ok. Landis Gandy, RN

## 2016-03-04 ENCOUNTER — Ambulatory Visit: Payer: Self-pay | Admitting: Pharmacist

## 2016-03-04 ENCOUNTER — Other Ambulatory Visit (INDEPENDENT_AMBULATORY_CARE_PROVIDER_SITE_OTHER): Payer: Self-pay

## 2016-03-04 ENCOUNTER — Other Ambulatory Visit: Payer: Self-pay

## 2016-03-04 DIAGNOSIS — B20 Human immunodeficiency virus [HIV] disease: Secondary | ICD-10-CM

## 2016-03-04 DIAGNOSIS — Z113 Encounter for screening for infections with a predominantly sexual mode of transmission: Secondary | ICD-10-CM

## 2016-03-04 LAB — CBC
HCT: 38.9 % (ref 35.0–45.0)
Hemoglobin: 12.6 g/dL (ref 11.7–15.5)
MCH: 27.8 pg (ref 27.0–33.0)
MCHC: 32.4 g/dL (ref 32.0–36.0)
MCV: 85.9 fL (ref 80.0–100.0)
MPV: 9.2 fL (ref 7.5–12.5)
PLATELETS: 172 10*3/uL (ref 140–400)
RBC: 4.53 MIL/uL (ref 3.80–5.10)
RDW: 15.3 % — AB (ref 11.0–15.0)
WBC: 4.4 10*3/uL (ref 3.8–10.8)

## 2016-03-04 MED ORDER — ONDANSETRON 4 MG PO TBDP: 4.0000 mg | ORAL_TABLET | Freq: Three times a day (TID) | ORAL | 0 refills | Status: DC | PRN

## 2016-03-04 NOTE — Progress Notes (Signed)
HPI: Veronica Robles is a 60 y.o. female who presents to the [facility] for follow up of her HIV. Kermit is still having issues with her medications.  She tells me that she wakes up in the morning and all she thinks about is having to take her HIV medications.  She states "she knows her body" and her body does not like the HIV medications.  She also states she feels very tired and nauseous when she takes them. She does admit to missing several doses in the last 2 weeks.  She states the Thurnell Garbe makes her gag most of the time and she spits it out and doesn't take it.  She very much wishes to go back to a single tablet regimen or less pills a day.  She is very upset here in the clinic and states taking the medication makes her "feel down".   Allergies: Allergies  Allergen Reactions  . Penicillins     REACTION: rash    Past Medical History: Past Medical History:  Diagnosis Date  . HIV infection ([place])   . Hypertension     Social History: Social History   Social History  . Marital status: Single    Spouse name: N/A  . Number of children: N/A  . Years of education: N/A   Social History Main Topics  . Smoking status: Former Smoker    Packs/day: 0.10    Types: Cigarettes  . Smokeless tobacco: Never Used  . Alcohol use No  . Drug use: No  . Sexual activity: Not Currently    Birth control/ protection: Surgical   Other Topics Concern  . Not on file   Social History Narrative  . No narrative on file    Current Regimen: Evotaz, Tivicay BID, Retrovir BID  Labs: HIV 1 RNA Quant (copies/mL)  Date Value  01/08/2016 180 (H)  11/20/2015 319 (H)  10/30/2015 237 (H)   CD4 T Cell Abs (/uL)  Date Value  10/30/2015 320 (L)  05/24/2015 410  09/28/2014 400   Hep B S Ab (no units)  Date Value  08/08/2009 INDETER (A)   Hepatitis B Surface Ag (no units)  Date Value  08/08/2009 NEG   HCV Ab (no units)  Date Value  05/02/2013 NEGATIVE    CrCl: CrCl cannot be calculated  (Unknown ideal weight.).  Lipids:    Component Value Date/Time   CHOL 212 (H) 10/30/2015 1220   TRIG 183 (H) 10/30/2015 1220   HDL 47 10/30/2015 1220   CHOLHDL 4.5 10/30/2015 1220   VLDL 37 (H) 10/30/2015 1220   LDLCALC 128 10/30/2015 1220    HIV Genosure Composite Data Genotype Date: June 13th 2017  Mutations inBold impact drug susceptibility RT Mutations V21I, S48E, K65R, S68S/G, T69N, K101E, Q102K, C162S, Y181C, M184V, G190A, R211T, A272P, R277K, V293I, M357T, K358R, K366R, A376T, A400T  PI Mutations L10L/I, N37S, L63P, I72T, V77I, G78G/E  Integrase Mutations K7K/R, S17N, V31I, V72I, V113I, S119P, T122I, T124A, T125T/A, I135V, V151I, N155H, K156N, I162V, V201I, T218S, D232N, V234L, D256E   Interpretation of Genotype Data per Stanford HIV Database and Genosure  Nucleoside RTIs  Abacavir - resistant Didanosine - resistant Emtricitabine - resistant Lamivudine - resistant Stavudine - sensitive Tenofovir - resistant Zidovudine - sensitive   Non-Nucleoside RTIs  Efavirenz - resistant Etravirine - resistant Nevirapine - resistant Rilpivirine - resistant   Protease Inhibitors  Atazanavir - sensitive Darunavir - sensitive Fosamprevir - sensitive Indinavir - sensitive Lopinavir - sensitive Nefinavir - sensitive Ritonavir - sensitive Saquinavir -  sensitive Tipranavir - sensitive   Integrase Inhibitors  Dolutegravir - possible low-level resistance Elvitegravir - resistant Raltegravir - resistant     Assessment: Dannae has had HIV since the early 1990s. She is very upset today and is asking for a simpler regimen.  I went through her resistance with her again today and explained that the [facility] she was once taking has hardly any activity against her virus.  I also explained the other single tablet regimens and showed her how those will not work either.  She also asked if she could take the Tivicay and Retrovir once daily.  I also explained to her she had to  take those twice daily.  We had a long talk today, and I reiterated the importance of taking her medications.  I also thoroughly explained that we were running out of options.  I spoke to Dr. Baxter Flattery about [facility] [place] and we both thought she could benefit from meeting with [facility] and [facility]. Danetta refused both of these appointments after I asked multiple times.  She may benefit from stopping all HIV medications and readdressing her disease when she is ready.  She will be seen by Dr. Baxter Flattery next week. I sent in a zofran ODT for her nausea.  Plans: - Continue Tivicay PO BID - Continue Retrovir BID - Continue Evotaz daily - F/u with Dr. Baxter Flattery next Tuesday 8/15 at 11:15am  Dalal Livengood L. Donnajean Lopes, PharmD Infectious [facility] 03/04/2016, 2:28 PM

## 2016-03-04 NOTE — Addendum Note (Signed)
Addended by: Dolan Amen D on: 03/04/2016 04:02 PM   Modules accepted: Orders

## 2016-03-05 LAB — T-HELPER CELLS (CD4) COUNT (NOT AT ARMC)
CD4 % Helper T Cell: 16 % — ABNORMAL LOW (ref 33–55)
CD4 T CELL ABS: 250 /uL — AB (ref 400–2700)

## 2016-03-05 LAB — HIV-1 RNA QUANT-NO REFLEX-BLD: HIV 1 RNA Quant: <20 copies/mL (ref <20)

## 2016-03-06 LAB — URINE CYTOLOGY ANCILLARY ONLY
CHLAMYDIA, DNA PROBE: NEGATIVE
Neisseria Gonorrhea: NEGATIVE

## 2016-03-11 ENCOUNTER — Ambulatory Visit: Payer: Self-pay | Admitting: *Deleted

## 2016-03-11 ENCOUNTER — Ambulatory Visit (INDEPENDENT_AMBULATORY_CARE_PROVIDER_SITE_OTHER): Payer: Self-pay | Admitting: Internal Medicine

## 2016-03-11 VITALS — BP 162/96 | HR 66 | Temp 98.7°F | Wt 132.0 lb

## 2016-03-11 DIAGNOSIS — Z9119 Patient's noncompliance with other medical treatment and regimen: Secondary | ICD-10-CM

## 2016-03-11 DIAGNOSIS — Z91199 Patient's noncompliance with other medical treatment and regimen due to unspecified reason: Secondary | ICD-10-CM

## 2016-03-11 DIAGNOSIS — B20 Human immunodeficiency virus [HIV] disease: Secondary | ICD-10-CM

## 2016-03-11 NOTE — Progress Notes (Signed)
Patient ID: Veronica Robles, female   DOB: 02/17/1956, 60 y.o.   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 CD 4 count of 250/VL<20. She was changed to tivicay BID, evotaz plus zidovudine. She is struggling with taking the medications twice a day, making her nauseated as well as having fatigue. She stopped briefly then resumed once she met back with pharmacy. She is tearful during this exchange.  HIV Genosure Composite Data Genotype Date: June 13th 2017  Mutations inBold impact drug susceptibility RT Mutations V21I, S48E, K65R, S68S/G, T69N, K101E, Q102K, C162S, Y181C, M184V, G190A, R211T, A272P, R277K, V293I, M357T, K358R, K366R, A376T, A400T  PI Mutations L10L/I, N37S, L63P, I72T, V77I, G78G/E  Integrase Mutations K7K/R, S17N, V31I, V72I, V113I, S119P, T122I, T124A, T125T/A, I135V, V151I, N155H, K156N, I162V, V201I, T218S, D232N, V234L, D256E   Interpretation of Genotype Data per Stanford HIV Database and Genosure  Nucleoside RTIs  Abacavir - resistant Didanosine - resistant Emtricitabine - resistant Lamivudine - resistant Stavudine - sensitive Tenofovir - resistant Zidovudine - sensitive   Non-Nucleoside RTIs  Efavirenz - resistant Etravirine - resistant Nevirapine - resistant Rilpivirine - resistant   Protease Inhibitors  Atazanavir - sensitive Darunavir - sensitive Fosamprevir - sensitive Indinavir - sensitive Lopinavir - sensitive Nefinavir - sensitive Ritonavir - sensitive Saquinavir - sensitive Tipranavir - sensitive   Integrase Inhibitors  Dolutegravir - possible low-level resistance Elvitegravir - resistant Raltegravir - resistant    Outpatient Encounter Prescriptions as of 60/15/2017  Medication Sig  . atazanavir-cobicistat (EVOTAZ) 300-150 MG tablet Take 1 tablet by mouth daily. Swallow whole. Do NOT crush, cut or chew tablet. Take with food.  . Blood  Pressure Monitoring (ADULT BLOOD PRESSURE CUFF LG) KIT    . dolutegravir (TIVICAY) 50 MG tablet Take 1 tablet (50 mg total) by mouth 2 (two) times daily.  Marland Kitchen lisinopril (PRINIVIL,ZESTRIL) 10 MG tablet TAKE ONE TABLET BY MOUTH DAILY  . zidovudine (RETROVIR) 300 MG tablet Take 1 tablet (300 mg total) by mouth 2 (two) times daily.  . ondansetron (ZOFRAN ODT) 4 MG disintegrating tablet Take 1 tablet (4 mg total) by mouth every 8 (eight) hours as needed for nausea or vomiting. (Patient not taking: Reported on 03/11/2016)   No facility-administered encounter medications on file as of 03/11/2016.      Patient Active Problem List   Diagnosis Date Noted  . 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 HYPERTENSION, BENIGN 01/26/2007  . 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 AIDS 10/26/2006  . HX, PERSONAL, PENICILLIN ALLERGY 10/26/2006  . HX, PERSONAL, DRUG ALLERGY NOS 10/26/2006     Health Maintenance Due  Topic Date Due  . COLONOSCOPY  10/19/2005  . TETANUS/TDAP  04/27/2012  . PAP SMEAR  01/23/2015  . ZOSTAVAX  10/20/2015  . INFLUENZA VACCINE  02/26/2016     Review of Systems + nausea, fatigue. 10 point ros is negative Physical Exam   BP (!) 162/96 Comment: asymptomatic of HA, dizziness  Pulse 66   Temp 98.7 F (37.1 C) (Oral)   Wt 132 lb (59.9 kg)   SpO2 100%   BMI 24.14 kg/m  gen = a x oby 3 in NAD,  Psych = tearful Lab Results  Component Value Date   CD4TCELL 16 (L) 03/04/2016   Lab Results  Component Value Date   CD4TABS 250 (L) 03/04/2016   CD4TABS 320 (L) 10/30/2015   CD4TABS 410 05/24/2015  Lab Results  Component Value Date   HIV1RNAQUANT <20 03/04/2016   Lab Results  Component Value Date   HEPBSAB INDETER (A) 08/08/2009   No results found for: RPR  CBC Lab Results  Component Value Date   WBC 4.4 03/04/2016   RBC 4.53 03/04/2016   HGB 12.6 03/04/2016   HCT 38.9 03/04/2016   PLT 172 03/04/2016   MCV 85.9 03/04/2016   MCH 27.8  03/04/2016   MCHC 32.4 03/04/2016   RDW 15.3 (H) 03/04/2016   LYMPHSABS 1,456 10/30/2015   MONOABS 672 10/30/2015   EOSABS 56 10/30/2015   BASOSABS 0 10/30/2015   BMET Lab Results  Component Value Date   NA 141 10/30/2015   K 4.2 10/30/2015   CL 105 10/30/2015   CO2 31 10/30/2015   GLUCOSE 81 10/30/2015   BUN 12 10/30/2015   CREATININE 0.90 10/30/2015   CALCIUM 9.1 10/30/2015   GFRNONAA 70 10/30/2015   GFRAA 80 10/30/2015     Assessment and Plan  Adherence counseling for HIV disease Spent 49mn with patient in direct one on one time with counseling and identifying barriers to adherence. She is very spiritual and prays that god will help her with taking her medications regularly. [facility]inz home outreach nurse, introduced herself to see how to help patient with adherence. Patient is still very guarded to why it is difficult for her to take meds twice a day, still asks if one pill per day is an option.

## 2016-03-11 NOTE — Patient Instructions (Signed)
Can take zofran (anti-nausea) 15-20 min before you take other meds

## 2016-03-20 ENCOUNTER — Ambulatory Visit: Payer: Self-pay | Admitting: Internal Medicine

## 2016-03-27 ENCOUNTER — Encounter: Payer: Self-pay | Admitting: Internal Medicine

## 2016-03-27 ENCOUNTER — Ambulatory Visit (INDEPENDENT_AMBULATORY_CARE_PROVIDER_SITE_OTHER): Payer: Self-pay | Admitting: Internal Medicine

## 2016-03-27 VITALS — BP 162/90 | HR 67 | Temp 97.9°F | Ht 62.0 in | Wt 133.0 lb

## 2016-03-27 DIAGNOSIS — B20 Human immunodeficiency virus [HIV] disease: Secondary | ICD-10-CM

## 2016-03-27 DIAGNOSIS — I10 Essential (primary) hypertension: Secondary | ICD-10-CM

## 2016-03-27 DIAGNOSIS — Z23 Encounter for immunization: Secondary | ICD-10-CM

## 2016-03-27 NOTE — Patient Instructions (Signed)
A little progress each day adds up to big results   Good work on keeping up with taking your meds every day   --------------------------------------------------

## 2016-03-27 NOTE — Progress Notes (Signed)
Patient ID: Veronica Robles, female   DOB: 07-Feb-1956, 60 y.o.   MRN: [id number]  HPI Veronica Robles is a 60yo F with hiv disease, CD 4 count of 250, but we had recently changed her HIV regimen due to prolonged viremia, gentoype showing mutations to NNRTI, NRTI, and II.  She has been switched to  tivicay BID, evotaz plus zidovudine  she had multiple visits with pharmacy and myself to help with adherence counseling. She is distraught that she has to take so many pills   Tivicay bid/evotaz/zidovudine  Viral load undetectable as of 8/8  Since starting the new regimen, she has missed 2 separate doses.  Outpatient Encounter Prescriptions as of 03/27/2016  Medication Sig  . atazanavir-cobicistat (EVOTAZ) 300-150 MG tablet Take 1 tablet by mouth daily. Swallow whole. Do NOT crush, cut or chew tablet. Take with food.  . Blood Pressure Monitoring (ADULT BLOOD PRESSURE CUFF LG) KIT    . dolutegravir (TIVICAY) 50 MG tablet Take 1 tablet (50 mg total) by mouth 2 (two) times daily.  Marland Kitchen lisinopril (PRINIVIL,ZESTRIL) 10 MG tablet TAKE ONE TABLET BY MOUTH DAILY  . ondansetron (ZOFRAN ODT) 4 MG disintegrating tablet Take 1 tablet (4 mg total) by mouth every 8 (eight) hours as needed for nausea or vomiting. (Patient not taking: Reported on 03/11/2016)  . zidovudine (RETROVIR) 300 MG tablet Take 1 tablet (300 mg total) by mouth 2 (two) times daily.   No facility-administered encounter medications on file as of 03/27/2016.      Patient Active Problem List   Diagnosis Date Noted  . 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 HYPERTENSION, BENIGN 01/26/2007  . 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 AIDS 10/26/2006  . HX, PERSONAL, PENICILLIN ALLERGY 10/26/2006  . HX, PERSONAL, DRUG ALLERGY NOS 10/26/2006     Health Maintenance Due  Topic Date Due  . COLONOSCOPY  10/19/2005  . TETANUS/TDAP  04/27/2012  . PAP SMEAR  01/23/2015  . ZOSTAVAX  10/20/2015  . INFLUENZA VACCINE   02/26/2016     Review of Systems  Constitutional: Negative for fever, chills, diaphoresis, activity change, appetite change, fatigue and unexpected weight change.  HENT: Negative for congestion, sore throat, rhinorrhea, sneezing, trouble swallowing and sinus pressure.  Eyes: Negative for photophobia and visual disturbance.  Respiratory: Negative for cough, chest tightness, shortness of breath, wheezing and stridor.  Cardiovascular: Negative for chest pain, palpitations and leg swelling.  Gastrointestinal: Negative for nausea, vomiting, abdominal pain, diarrhea, constipation, blood in stool, abdominal distention and anal bleeding.  Genitourinary: Negative for dysuria, hematuria, flank pain and difficulty urinating.  Musculoskeletal: Negative for myalgias, back pain, joint swelling, arthralgias and gait problem.  Skin: Negative for color change, pallor, rash and wound.  Neurological: Negative for dizziness, tremors, weakness and light-headedness.  Hematological: Negative for adenopathy. Does not bruise/bleed easily.  Psychiatric/Behavioral: Negative for behavioral problems, confusion, sleep disturbance, dysphoric mood, decreased concentration and agitation.    Physical Exam  BP (!) 162/90   Pulse 67   Temp 97.9 F (36.6 C) (Oral)   Ht '5\' 2"'  (1.575 m)   Wt 133 lb (60.3 kg)   BMI 24.33 kg/m  Physical Exam  Constitutional:  oriented to person, place, and time. appears well-developed and well-nourished. No distress.  HENT: [place]/AT, PERRLA, no scleral icterus Mouth/Throat: Oropharynx is clear and moist. No oropharyngeal exudate.  Cardiovascular: Normal rate, regular rhythm and normal heart sounds. Exam reveals no gallop and no friction rub.  No murmur heard.  Pulmonary/Chest: Effort  normal and breath sounds normal. No respiratory distress.  has no wheezes.  Neck = supple, no nuchal rigidity Abdominal: Soft. Bowel sounds are normal.  exhibits no distension. There is no tenderness.    Lymphadenopathy: no cervical adenopathy. No axillary adenopathy Neurological: alert and oriented to person, place, and time.  Skin: Skin is warm and dry. No rash noted. No erythema.  Psychiatric: a normal mood and affect.  behavior is normal.    Lab Results  Component Value Date   CD4TCELL 16 (L) 03/04/2016   Lab Results  Component Value Date   CD4TABS 250 (L) 03/04/2016   CD4TABS 320 (L) 10/30/2015   CD4TABS 410 05/24/2015   Lab Results  Component Value Date   HIV1RNAQUANT <20 03/04/2016   Lab Results  Component Value Date   HEPBSAB INDETER (A) 08/08/2009   No results found for: RPR  CBC Lab Results  Component Value Date   WBC 4.4 03/04/2016   RBC 4.53 03/04/2016   HGB 12.6 03/04/2016   HCT 38.9 03/04/2016   PLT 172 03/04/2016   MCV 85.9 03/04/2016   MCH 27.8 03/04/2016   MCHC 32.4 03/04/2016   RDW 15.3 (H) 03/04/2016   LYMPHSABS 1,456 10/30/2015   MONOABS 672 10/30/2015   EOSABS 56 10/30/2015   BASOSABS 0 10/30/2015   BMET Lab Results  Component Value Date   NA 141 10/30/2015   K 4.2 10/30/2015   CL 105 10/30/2015   CO2 31 10/30/2015   GLUCOSE 81 10/30/2015   BUN 12 10/30/2015   CREATININE 0.90 10/30/2015   CALCIUM 9.1 10/30/2015   GFRNONAA 70 10/30/2015   GFRAA 80 10/30/2015    Assessment and Plan  hiv disease = applauded her adherence since she is now undetectable. Continue to strong work.   Health maintenance = to get flu shot today  htn = above goal though she was tearful at hearing hte news. If still elevated  At next visit, we iwll need to adjust meds and increase lisinopril to 24m dialy

## 2016-04-10 ENCOUNTER — Other Ambulatory Visit: Payer: Self-pay

## 2016-04-10 DIAGNOSIS — I10 Essential (primary) hypertension: Secondary | ICD-10-CM

## 2016-04-10 MED ORDER — DOLUTEGRAVIR SODIUM 50 MG PO TABS: 50.0000 mg | ORAL_TABLET | Freq: Two times a day (BID) | ORAL | 11 refills | Status: DC

## 2016-04-10 MED ORDER — LISINOPRIL 10 MG PO TABS: 10.0000 mg | ORAL_TABLET | Freq: Every day | ORAL | 11 refills | Status: DC

## 2016-04-10 MED ORDER — ATAZANAVIR-COBICISTAT 300-150 MG PO TABS
1.0000 | ORAL_TABLET | Freq: Every day | ORAL | 3 refills | Status: DC
Start: 2016-04-10 — End: 2016-05-28

## 2016-04-10 MED ORDER — ZIDOVUDINE 300 MG PO TABS
300.0000 mg | ORAL_TABLET | Freq: Two times a day (BID) | ORAL | 11 refills | Status: DC
Start: 2016-04-10 — End: 2016-05-19

## 2016-04-17 NOTE — Progress Notes (Signed)
RN meet with Veronica Robles during her visit with Dr Baxter Flattery. After explaining to [name] my role and the clinic and the many services I can offer, Together we discussed the patient's trouble with taking her 2 pill regimen instead of her one pill regimen. After a lengthy discussion the patient stated she will continue to try and tolerate the 2 pills since she understands currently she has not other option for managing her HIV. Veronica Robles accepted my card and stated she will give me a call if she is interested in my services

## 2016-05-13 ENCOUNTER — Ambulatory Visit: Payer: Self-pay

## 2016-05-19 ENCOUNTER — Encounter: Payer: Self-pay | Admitting: Internal Medicine

## 2016-05-19 ENCOUNTER — Other Ambulatory Visit: Payer: Self-pay | Admitting: *Deleted

## 2016-05-19 DIAGNOSIS — I10 Essential (primary) hypertension: Secondary | ICD-10-CM

## 2016-05-19 DIAGNOSIS — B20 Human immunodeficiency virus [HIV] disease: Secondary | ICD-10-CM

## 2016-05-19 MED ORDER — LISINOPRIL 10 MG PO TABS: 10.0000 mg | ORAL_TABLET | Freq: Every day | ORAL | 2 refills | Status: DC

## 2016-05-19 MED ORDER — DOLUTEGRAVIR SODIUM 50 MG PO TABS: 50.0000 mg | ORAL_TABLET | Freq: Two times a day (BID) | ORAL | 3 refills | Status: DC

## 2016-05-19 MED ORDER — ZIDOVUDINE 300 MG PO TABS: 300.0000 mg | ORAL_TABLET | Freq: Two times a day (BID) | ORAL | 3 refills | Status: DC

## 2016-05-20 ENCOUNTER — Other Ambulatory Visit: Payer: Self-pay | Admitting: *Deleted

## 2016-05-20 MED ORDER — ZIDOVUDINE 100 MG PO CAPS: 300.0000 mg | ORAL_CAPSULE | Freq: Every day | ORAL | 5 refills | Status: DC

## 2016-05-28 ENCOUNTER — Other Ambulatory Visit: Payer: Self-pay | Admitting: Pharmacist Clinician (PhC)/ Clinical Pharmacy Specialist

## 2016-05-28 DIAGNOSIS — B20 Human immunodeficiency virus [HIV] disease: Secondary | ICD-10-CM

## 2016-05-28 MED ORDER — ZIDOVUDINE 300 MG PO TABS: 300.0000 mg | ORAL_TABLET | Freq: Two times a day (BID) | ORAL | 3 refills | Status: DC

## 2016-05-28 MED ORDER — ATAZANAVIR-COBICISTAT 300-150 MG PO TABS: 1.0000 | ORAL_TABLET | Freq: Every day | ORAL | 3 refills | Status: DC

## 2016-05-29 ENCOUNTER — Telehealth: Payer: Self-pay | Admitting: *Deleted

## 2016-05-29 ENCOUNTER — Telehealth: Payer: Self-pay | Admitting: Pharmacist Clinician (PhC)/ Clinical Pharmacy Specialist

## 2016-05-29 NOTE — Telephone Encounter (Signed)
Call from pharmacist with [facility] concerning a dose increase on the Retrovir. She will call back in the AM to speak to [place], pharmacist. [phone], reference # [id number]. Myrtis Hopping CMA

## 2016-05-29 NOTE — Telephone Encounter (Signed)
It's unbelievable that the [place] arrived today in the clinic. Left her a voicemail to call and schedule a pick up time.

## 2016-05-30 ENCOUNTER — Telehealth: Payer: Self-pay | Admitting: Pharmacist Clinician (PhC)/ Clinical Pharmacy Specialist

## 2016-05-30 NOTE — Telephone Encounter (Signed)
Veronica Robles came in to pick her 3 bottle of Evotaz today. Made her a calendar for her regimen to clear up any confusion. Unfortunately, she has a very low level of understanding at times.

## 2016-05-30 NOTE — Telephone Encounter (Signed)
Called the pharmacy back to clarify the AZT issue. They will change the formulation to the 300mg  tablet version.

## 2016-06-02 ENCOUNTER — Other Ambulatory Visit: Payer: Self-pay | Admitting: Pharmacist Clinician (PhC)/ Clinical Pharmacy Specialist

## 2016-06-02 DIAGNOSIS — B20 Human immunodeficiency virus [HIV] disease: Secondary | ICD-10-CM

## 2016-06-02 MED ORDER — ATAZANAVIR-COBICISTAT 300-150 MG PO TABS: 1.0000 | ORAL_TABLET | Freq: Every day | ORAL | 6 refills | Status: DC

## 2016-06-02 MED ORDER — DOLUTEGRAVIR SODIUM 50 MG PO TABS: 50.0000 mg | ORAL_TABLET | Freq: Two times a day (BID) | ORAL | 6 refills | Status: DC

## 2016-06-02 MED ORDER — ZIDOVUDINE 300 MG PO TABS: 300.0000 mg | ORAL_TABLET | Freq: Two times a day (BID) | ORAL | 6 refills | Status: DC

## 2016-06-02 NOTE — Progress Notes (Signed)
Tierany called and said that she is approved for ADAP. Rx for ARTs have been sent to [facility].

## 2016-06-17 ENCOUNTER — Other Ambulatory Visit (INDEPENDENT_AMBULATORY_CARE_PROVIDER_SITE_OTHER): Payer: Self-pay

## 2016-06-17 DIAGNOSIS — B20 Human immunodeficiency virus [HIV] disease: Secondary | ICD-10-CM

## 2016-06-17 LAB — COMPLETE METABOLIC PANEL WITH GFR
ALBUMIN: 4.1 g/dL (ref 3.6–5.1)
ALT: 15 U/L (ref 6–29)
AST: 21 U/L (ref 10–35)
Alkaline Phosphatase: 81 U/L (ref 33–130)
BILIRUBIN TOTAL: 0.7 mg/dL (ref 0.2–1.2)
BUN: 13 mg/dL (ref 7–25)
CO2: 28 mmol/L (ref 20–31)
Calcium: 9.1 mg/dL (ref 8.6–10.4)
Chloride: 105 mmol/L (ref 98–110)
Creat: 1.07 mg/dL — ABNORMAL HIGH (ref 0.50–0.99)
GFR, EST NON AFRICAN AMERICAN: 57 mL/min — AB (ref >=60)
GFR, Est African American: 65 mL/min (ref >=60)
GLUCOSE: 73 mg/dL (ref 65–99)
Potassium: 4.2 mmol/L (ref 3.5–5.3)
SODIUM: 140 mmol/L (ref 135–146)
TOTAL PROTEIN: 6.8 g/dL (ref 6.1–8.1)

## 2016-06-17 LAB — CBC WITH DIFFERENTIAL/PLATELET
BASOS ABS: 0 {cells}/uL (ref 0–200)
Basophils Relative: 0 %
EOS PCT: 1 %
Eosinophils Absolute: 50 cells/uL (ref 15–500)
HCT: 39.3 % (ref 35.0–45.0)
Hemoglobin: 12.4 g/dL (ref 11.7–15.5)
LYMPHS PCT: 34 %
Lymphs Abs: 1700 cells/uL (ref 850–3900)
MCH: 29.5 pg (ref 27.0–33.0)
MCHC: 31.6 g/dL — AB (ref 32.0–36.0)
MCV: 93.3 fL (ref 80.0–100.0)
MONOS PCT: 10 %
MPV: 9.4 fL (ref 7.5–12.5)
Monocytes Absolute: 500 cells/uL (ref 200–950)
NEUTROS PCT: 55 %
Neutro Abs: 2750 cells/uL (ref 1500–7800)
PLATELETS: 207 10*3/uL (ref 140–400)
RBC: 4.21 MIL/uL (ref 3.80–5.10)
RDW: 16.7 % — AB (ref 11.0–15.0)
WBC: 5 10*3/uL (ref 3.8–10.8)

## 2016-06-18 LAB — T-HELPER CELL (CD4) - (RCID CLINIC ONLY)
CD4 % Helper T Cell: 18 % — ABNORMAL LOW (ref 33–55)
CD4 T CELL ABS: 330 /uL — AB (ref 400–2700)

## 2016-06-20 LAB — HIV-1 RNA QUANT-NO REFLEX-BLD

## 2016-07-01 ENCOUNTER — Ambulatory Visit: Payer: Self-pay | Admitting: Internal Medicine

## 2016-07-01 ENCOUNTER — Encounter: Payer: Self-pay | Admitting: Internal Medicine

## 2016-07-01 ENCOUNTER — Ambulatory Visit (INDEPENDENT_AMBULATORY_CARE_PROVIDER_SITE_OTHER): Payer: Self-pay | Admitting: Internal Medicine

## 2016-07-01 VITALS — BP 174/99 | HR 68 | Temp 97.5°F | Ht 63.0 in | Wt 134.0 lb

## 2016-07-01 DIAGNOSIS — Z91199 Patient's noncompliance with other medical treatment and regimen due to unspecified reason: Secondary | ICD-10-CM

## 2016-07-01 DIAGNOSIS — B20 Human immunodeficiency virus [HIV] disease: Secondary | ICD-10-CM

## 2016-07-01 DIAGNOSIS — I10 Essential (primary) hypertension: Secondary | ICD-10-CM

## 2016-07-01 DIAGNOSIS — Z9119 Patient's noncompliance with other medical treatment and regimen: Secondary | ICD-10-CM

## 2016-07-01 NOTE — Progress Notes (Signed)
RFV: follow up for hiv disease  Patient ID: Veronica Robles, female   DOB: 05-25-1956, 60 y.o.   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  She has been switched to  tivicay BID, evotaz plus zidovudine. She reports only taking her meds 5x per week, nauseated  Outpatient Encounter Prescriptions as of 07/01/2016  Medication Sig  . atazanavir-cobicistat (EVOTAZ) 300-150 MG tablet Take 1 tablet by mouth daily. Swallow whole. Do NOT crush, cut or chew tablet. Take with food.  . dolutegravir (TIVICAY) 50 MG tablet Take 1 tablet (50 mg total) by mouth 2 (two) times daily.  Marland Kitchen lisinopril (PRINIVIL,ZESTRIL) 10 MG tablet Take 1 tablet (10 mg total) by mouth daily.  . zidovudine (RETROVIR) 300 MG tablet Take 1 tablet (300 mg total) by mouth 2 (two) times daily.  . Blood Pressure Monitoring (ADULT BLOOD PRESSURE CUFF LG) KIT     No facility-administered encounter medications on file as of 07/01/2016.      Patient Active Problem List   Diagnosis Date Noted  . 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 HYPERTENSION, BENIGN 01/26/2007  . 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 AIDS 10/26/2006  . HX, PERSONAL, PENICILLIN ALLERGY 10/26/2006  . HX, PERSONAL, DRUG ALLERGY NOS 10/26/2006     Health Maintenance Due  Topic Date Due  . COLONOSCOPY  10/19/2005  . TETANUS/TDAP  04/27/2012  . PAP SMEAR  01/23/2015  . ZOSTAVAX  10/20/2015     Review of Systems Per hpi. Otherwise 10 point ROS is negative Physical Exam   BP (!) 174/99 (BP Location: Right Arm, Patient Position: Sitting, Cuff Size: Normal) Comment: asymptpmatic  Pulse 68   Temp 97.5 F (36.4 C)   Ht _0  (1.6 m)   Wt 134 lb (60.8 kg)   SpO2 100%   BMI 23.74 kg/m  Physical Exam  Constitutional:  oriented to person, place, and time. appears well-developed and well-nourished. No distress.  HENT: [place]/AT, PERRLA, no scleral icterus Mouth/Throat:  Oropharynx is clear and moist. No oropharyngeal exudate.  Cardiovascular: Normal rate, regular rhythm and normal heart sounds. Exam reveals no gallop and no friction rub.  No murmur heard.  Pulmonary/Chest: Effort normal and breath sounds normal. No respiratory distress.  has no wheezes.  Neck = supple, no nuchal rigidity Abdominal: Soft. Bowel sounds are normal.  exhibits no distension. There is no tenderness.  Lymphadenopathy: no cervical adenopathy. No axillary adenopathy Neurological: alert and oriented to person, place, and time.  Skin: Skin is warm and dry. No rash noted. No erythema.  Psychiatric: a normal mood and affect.  behavior is normal.   Lab Results  Component Value Date   CD4TCELL 18 (L) 06/17/2016   Lab Results  Component Value Date   CD4TABS 330 (L) 06/17/2016   CD4TABS 250 (L) 03/04/2016   CD4TABS 320 (L) 10/30/2015   Lab Results  Component Value Date   HIV1RNAQUANT <20 06/17/2016   Lab Results  Component Value Date   HEPBSAB INDETER (A) 08/08/2009   No results found for: RPR  CBC Lab Results  Component Value Date   WBC 5.0 06/17/2016   RBC 4.21 06/17/2016   HGB 12.4 06/17/2016   HCT 39.3 06/17/2016   PLT 207 06/17/2016   MCV 93.3 06/17/2016   MCH 29.5 06/17/2016   MCHC 31.6 (L) 06/17/2016   RDW 16.7 (H) 06/17/2016   LYMPHSABS 1,700 06/17/2016   MONOABS 500 06/17/2016   EOSABS  50 06/17/2016   BASOSABS 0 06/17/2016   BMET Lab Results  Component Value Date   NA 140 06/17/2016   K 4.2 06/17/2016   CL 105 06/17/2016   CO2 28 06/17/2016   GLUCOSE 73 06/17/2016   BUN 13 06/17/2016   CREATININE 1.07 (H) 06/17/2016   CALCIUM 9.1 06/17/2016   GFRNONAA 57 (L) 06/17/2016   GFRAA 65 06/17/2016     Assessment and Plan   Adherence counseling = for 20 min of visit  hiv disease= well controlled but still needs hand holding for better adherence mssing 2 morning doses usually per week. May need to refill evotaz sooner  Health maintenace =  meningo coccal at next visit  htn = address at next visit. On repeat sBP 148

## 2016-07-30 ENCOUNTER — Ambulatory Visit: Payer: Self-pay

## 2016-09-02 ENCOUNTER — Ambulatory Visit (INDEPENDENT_AMBULATORY_CARE_PROVIDER_SITE_OTHER): Payer: Self-pay | Admitting: Internal Medicine

## 2016-09-02 ENCOUNTER — Encounter: Payer: Self-pay | Admitting: Internal Medicine

## 2016-09-02 ENCOUNTER — Other Ambulatory Visit: Payer: Self-pay | Admitting: *Deleted

## 2016-09-02 VITALS — BP 177/93 | HR 72 | Temp 98.3°F | Ht 62.5 in | Wt 137.0 lb

## 2016-09-02 DIAGNOSIS — B20 Human immunodeficiency virus [HIV] disease: Secondary | ICD-10-CM

## 2016-09-02 DIAGNOSIS — I10 Essential (primary) hypertension: Secondary | ICD-10-CM

## 2016-09-02 DIAGNOSIS — R140 Abdominal distension (gaseous): Secondary | ICD-10-CM

## 2016-09-02 LAB — BASIC METABOLIC PANEL
BUN: 10 mg/dL (ref 7–25)
CALCIUM: 9.1 mg/dL (ref 8.6–10.4)
CO2: 29 mmol/L (ref 20–31)
CREATININE: 1.02 mg/dL — AB (ref 0.50–0.99)
Chloride: 107 mmol/L (ref 98–110)
GLUCOSE: 81 mg/dL (ref 65–99)
Potassium: 4.5 mmol/L (ref 3.5–5.3)
Sodium: 142 mmol/L (ref 135–146)

## 2016-09-02 MED ORDER — LISINOPRIL 10 MG PO TABS
10.0000 mg | ORAL_TABLET | Freq: Every day | ORAL | 11 refills | Status: DC
Start: 2016-09-02 — End: 2017-01-31

## 2016-09-02 MED ORDER — AMLODIPINE BESYLATE 10 MG PO TABS: 10.0000 mg | ORAL_TABLET | Freq: Every day | ORAL | 11 refills | Status: DC

## 2016-09-02 NOTE — Patient Instructions (Signed)
With your new blood pressure medication : start with 1/2 tab daily for 4 days then start taking a full tablet daily  For bloating :  Try to take activia yogurt, small container daily to see if any improvement. Do a 1-2 week trial

## 2016-09-02 NOTE — Progress Notes (Signed)
r  RFV: hiv disease follow up  Patient ID: Veronica Robles, female   DOB: 1956/01/02, 61 y.o.   MRN: [id number]  HPI Veronica Robles is a 61yo F with HIV disease, last year complicated by medication adherence issues. CD 4 count of 330/LV<20 in November 2017, currently on DTG/ATVc/ZDV. She continues to do well with adherence of late. No other health issues  Outpatient Encounter Prescriptions as of 09/02/2016  Medication Sig  . atazanavir-cobicistat (EVOTAZ) 300-150 MG tablet Take 1 tablet by mouth daily. Swallow whole. Do NOT crush, cut or chew tablet. Take with food.  . Blood Pressure Monitoring (ADULT BLOOD PRESSURE CUFF LG) KIT    . dolutegravir (TIVICAY) 50 MG tablet Take 1 tablet (50 mg total) by mouth 2 (two) times daily.  Marland Kitchen lisinopril (PRINIVIL,ZESTRIL) 10 MG tablet Take 1 tablet (10 mg total) by mouth daily.  . zidovudine (RETROVIR) 300 MG tablet Take 1 tablet (300 mg total) by mouth 2 (two) times daily.   No facility-administered encounter medications on file as of 09/02/2016.      Patient Active Problem List   Diagnosis Date Noted  . 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 HYPERTENSION, BENIGN 01/26/2007  . 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 AIDS 10/26/2006  . HX, PERSONAL, PENICILLIN ALLERGY 10/26/2006  . HX, PERSONAL, DRUG ALLERGY NOS 10/26/2006     Health Maintenance Due  Topic Date Due  . COLONOSCOPY  10/19/2005  . TETANUS/TDAP  04/27/2012  . PAP SMEAR  01/23/2015  . ZOSTAVAX  10/20/2015     Review of Systems Review of Systems  Constitutional: Negative for fever, chills, diaphoresis, activity change, appetite change, fatigue and unexpected weight change.  HENT: Negative for congestion, sore throat, rhinorrhea, sneezing, trouble swallowing and sinus pressure.  Eyes: Negative for photophobia and visual disturbance.  Respiratory: Negative for cough, chest tightness, shortness of breath, wheezing and stridor.  Cardiovascular: Negative for  chest pain, palpitations and leg swelling.  Gastrointestinal: Negative for nausea, vomiting, abdominal pain, diarrhea, constipation, blood in stool, abdominal distention and anal bleeding.  Genitourinary: Negative for dysuria, hematuria, flank pain and difficulty urinating.  Musculoskeletal: Negative for myalgias, back pain, joint swelling, arthralgias and gait problem.  Skin: Negative for color change, pallor, rash and wound.  Neurological: Negative for dizziness, tremors, weakness and light-headedness.  Hematological: Negative for adenopathy. Does not bruise/bleed easily.  Psychiatric/Behavioral: Negative for behavioral problems, confusion, sleep disturbance, dysphoric mood, decreased concentration and agitation.    Physical Exam  BP (!) 177/93   Pulse 72   Temp 98.3 F (36.8 C) (Oral)   Ht 5' 2.5" (1.588 m)   Wt 137 lb (62.1 kg)   BMI 24.66 kg/m  Physical Exam  Constitutional:  oriented to person, place, and time. appears well-developed and well-nourished. No distress.  HENT: /AT, PERRLA, no scleral icterus Mouth/Throat: Oropharynx is clear and moist. No oropharyngeal exudate.  Cardiovascular: Normal rate, regular rhythm and normal heart sounds. Exam reveals no gallop and no friction rub.  No murmur heard.  Pulmonary/Chest: Effort normal and breath sounds normal. No respiratory distress.  has no wheezes.  Neck = supple, no nuchal rigidity Abdominal: Soft. Bowel sounds are normal.  exhibits no distension. There is no tenderness.  Lymphadenopathy: no cervical adenopathy. No axillary adenopathy Neurological: alert and oriented to person, place, and time.  Skin: Skin is warm and dry. No rash noted. No erythema.  Psychiatric: a normal mood and affect.  behavior is normal.    Lab Results  Component Value  Date   CD4TCELL 18 (L) 06/17/2016   Lab Results  Component Value Date   CD4TABS 330 (L) 06/17/2016   CD4TABS 250 (L) 03/04/2016   CD4TABS 320 (L) 10/30/2015   Lab Results    Component Value Date   HIV1RNAQUANT <20 06/17/2016   Lab Results  Component Value Date   HEPBSAB INDETER (A) 08/08/2009   Lab Results  Component Value Date   LABRPR NON REAC 10/30/2015    CBC Lab Results  Component Value Date   WBC 5.0 06/17/2016   RBC 4.21 06/17/2016   HGB 12.4 06/17/2016   HCT 39.3 06/17/2016   PLT 207 06/17/2016   MCV 93.3 06/17/2016   MCH 29.5 06/17/2016   MCHC 31.6 (L) 06/17/2016   RDW 16.7 (H) 06/17/2016   LYMPHSABS 1,700 06/17/2016   MONOABS 500 06/17/2016   EOSABS 50 06/17/2016    BMET Lab Results  Component Value Date   NA 140 06/17/2016   K 4.2 06/17/2016   CL 105 06/17/2016   CO2 28 06/17/2016   GLUCOSE 73 06/17/2016   BUN 13 06/17/2016   CREATININE 1.07 (H) 06/17/2016   CALCIUM 9.1 06/17/2016   GFRNONAA 57 (L) 06/17/2016   GFRAA 65 06/17/2016      Assessment and Plan  hiv disease = well controlled. Will check CD 4 count and VL due to concern for noncomplicance  htn = poorly controlled. We will add amlodipine 43m   Have her recheck BP next week  Bloating = can consider using gas-ex to relieve symptoms

## 2016-09-04 LAB — T-HELPER CELL (CD4) - (RCID CLINIC ONLY)
CD4 % Helper T Cell: 19 % — ABNORMAL LOW (ref 33–55)
CD4 T CELL ABS: 350 /uL — AB (ref 400–2700)

## 2016-09-05 LAB — HIV-1 RNA QUANT-NO REFLEX-BLD
HIV 1 RNA Quant: <20 copies/mL
HIV-1 RNA Quant, Log: <1.3 Log copies/mL

## 2016-09-11 ENCOUNTER — Encounter: Payer: Self-pay | Admitting: Internal Medicine

## 2016-09-15 ENCOUNTER — Telehealth: Payer: Self-pay | Admitting: *Deleted

## 2016-09-15 NOTE — Telephone Encounter (Signed)
Patient called to let Dr Baxter Flattery know that her blood pressure at home was 139/84. Landis Gandy, RN

## 2016-09-16 NOTE — Telephone Encounter (Signed)
Thanks for the update. Better than what it had been in clinic

## 2016-10-14 ENCOUNTER — Other Ambulatory Visit: Payer: Self-pay | Admitting: Internal Medicine

## 2016-10-14 DIAGNOSIS — Z1231 Encounter for screening mammogram for malignant neoplasm of breast: Secondary | ICD-10-CM

## 2016-10-26 ENCOUNTER — Emergency Department (HOSPITAL_COMMUNITY): Payer: Self-pay

## 2016-10-26 ENCOUNTER — Encounter (HOSPITAL_COMMUNITY): Payer: Self-pay | Admitting: *Deleted

## 2016-10-26 ENCOUNTER — Emergency Department (HOSPITAL_COMMUNITY)
Admission: EM | Admit: 2016-10-26 | Discharge: 2016-10-26 | Disposition: A | Discharge status: Home / Self Care | Payer: Self-pay | Attending: Emergency Medicine | Admitting: Emergency Medicine

## 2016-10-26 DIAGNOSIS — N132 Hydronephrosis with renal and ureteral calculous obstruction: Secondary | ICD-10-CM | POA: Insufficient documentation

## 2016-10-26 DIAGNOSIS — I10 Essential (primary) hypertension: Secondary | ICD-10-CM | POA: Insufficient documentation

## 2016-10-26 DIAGNOSIS — Z87891 Personal history of nicotine dependence: Secondary | ICD-10-CM | POA: Insufficient documentation

## 2016-10-26 LAB — URINALYSIS, ROUTINE W REFLEX MICROSCOPIC
BACTERIA UA: NONE SEEN
BILIRUBIN URINE: NEGATIVE
GLUCOSE, UA: NEGATIVE mg/dL
KETONES UR: NEGATIVE mg/dL
NITRITE: NEGATIVE
Specific Gravity, Urine: 1.015 (ref 1.005–1.030)
pH: 5 (ref 5.0–8.0)

## 2016-10-26 LAB — COMPREHENSIVE METABOLIC PANEL
ALBUMIN: 4 g/dL (ref 3.5–5.0)
ALT: 18 U/L (ref 14–54)
ANION GAP: 7 (ref 5–15)
AST: 23 U/L (ref 15–41)
Alkaline Phosphatase: 86 U/L (ref 38–126)
BILIRUBIN TOTAL: 1 mg/dL (ref 0.3–1.2)
BUN: 16 mg/dL (ref 6–20)
CHLORIDE: 104 mmol/L (ref 101–111)
CO2: 27 mmol/L (ref 22–32)
Calcium: 9.3 mg/dL (ref 8.9–10.3)
Creatinine, Ser: 1.12 mg/dL — ABNORMAL HIGH (ref 0.44–1.00)
GFR calc Af Amer: >60 mL/min (ref >60)
GFR, EST NON AFRICAN AMERICAN: 52 mL/min — AB (ref >60)
GLUCOSE: 126 mg/dL — AB (ref 65–99)
POTASSIUM: 4 mmol/L (ref 3.5–5.1)
Sodium: 138 mmol/L (ref 135–145)
TOTAL PROTEIN: 7.5 g/dL (ref 6.5–8.1)

## 2016-10-26 LAB — CBC
HEMATOCRIT: 39 % (ref 36.0–46.0)
HEMOGLOBIN: 12.8 g/dL (ref 12.0–15.0)
MCH: 30.8 pg (ref 26.0–34.0)
MCHC: 32.8 g/dL (ref 30.0–36.0)
MCV: 93.8 fL (ref 78.0–100.0)
Platelets: 201 10*3/uL (ref 150–400)
RBC: 4.16 MIL/uL (ref 3.87–5.11)
RDW: 14 % (ref 11.5–15.5)
WBC: 14.7 10*3/uL — AB (ref 4.0–10.5)

## 2016-10-26 LAB — LIPASE, BLOOD: LIPASE: 18 U/L (ref 11–51)

## 2016-10-26 MED ORDER — CIPROFLOXACIN HCL 500 MG PO TABS
500.0000 mg | ORAL_TABLET | Freq: Once | ORAL | Status: AC
  Administered 2016-10-26: 500 mg via ORAL
  Filled 2016-10-26: qty 1

## 2016-10-26 MED ORDER — ONDANSETRON 4 MG PO TBDP: 4.0000 mg | ORAL_TABLET | Freq: Once | ORAL | Status: DC

## 2016-10-26 MED ORDER — HYDROMORPHONE HCL 1 MG/ML IJ SOLN
1.0000 mg | Freq: Once | INTRAMUSCULAR | Status: AC
  Administered 2016-10-26: 1 mg via INTRAVENOUS
  Filled 2016-10-26: qty 1

## 2016-10-26 MED ORDER — DEXTROSE 5 % IV SOLN
1.0000 g | Freq: Once | INTRAVENOUS | Status: AC
  Administered 2016-10-26: 1 g via INTRAVENOUS
  Filled 2016-10-26: qty 10

## 2016-10-26 MED ORDER — SODIUM CHLORIDE 0.9 % IV SOLN
1000.0000 mL | Freq: Once | INTRAVENOUS | Status: AC
  Administered 2016-10-26: 1000 mL via INTRAVENOUS

## 2016-10-26 MED ORDER — ONDANSETRON 4 MG PO TBDP: 4.0000 mg | ORAL_TABLET | ORAL | 0 refills | Status: DC | PRN

## 2016-10-26 MED ORDER — ONDANSETRON HCL 4 MG/2ML IJ SOLN
4.0000 mg | Freq: Once | INTRAMUSCULAR | Status: AC
  Administered 2016-10-26: 4 mg via INTRAVENOUS
  Filled 2016-10-26: qty 2

## 2016-10-26 MED ORDER — OXYCODONE-ACETAMINOPHEN 5-325 MG PO TABS
2.0000 | ORAL_TABLET | Freq: Once | ORAL | Status: AC
  Administered 2016-10-26: 2 via ORAL
  Filled 2016-10-26: qty 2

## 2016-10-26 MED ORDER — MORPHINE SULFATE (PF) 4 MG/ML IV SOLN
4.0000 mg | Freq: Once | INTRAVENOUS | Status: AC
  Administered 2016-10-26: 4 mg via INTRAVENOUS
  Filled 2016-10-26: qty 1

## 2016-10-26 MED ORDER — SODIUM CHLORIDE 0.9 % IV SOLN
1000.0000 mL | INTRAVENOUS | Status: DC
  Administered 2016-10-26: 1000 mL via INTRAVENOUS

## 2016-10-26 NOTE — Consult Note (Signed)
I was contacted from the emergency room regarding this patient on 10/26/16.  She presented with right flank pain.  A CT scan was obtained which revealed a 10 x 7 mm stone in the mid to distal right ureter with marked proximal hydronephrosis and blunting of the calyces with significant thinning of the renal parenchyma indicative of long-standing obstruction.  The stone had Hounsfield units of 1475.  Her creatinine was noted 1.1 pain was controlled.  Her urine did appear possibly infected with TNTC white blood cells and red blood cells but nitrate negative and squamous cells present.  Her white count was 14.7.  She was afebrile and had no tachycardia or hypotension. She did receive Rocephin 1 g and her urine was cultured. It did not appear she needed urgent stenting based on her current clinical situation.  I recommended she be placed on Cipro and was instructed to follow up with me the following morning.  She will contact me if she has worsening in the interim.

## 2016-10-26 NOTE — ED Notes (Signed)
After receiving pain meds and po abx, pt has emesis. Dr Vallery Ridge made aware.

## 2016-10-26 NOTE — ED Provider Notes (Signed)
[facility] DEPT Provider Note   CSN: [id number] Arrival date & time: 10/26/16  1130     History   Chief Complaint Chief Complaint  Patient presents with  . Abdominal Pain  . Emesis    HPI Veronica Robles is a 61 y.o. female.  HPI Patient awakened with right flank pain yesterday. She reports as been fairly severe and worsening. She reports is radiates down her right lower abdomen. She reports vomiting 4 times yesterday. Diarrhea and no constipation. No documented fever. Patient has not noted pain or burning with urination. She does report she has a distant history of a kidney stone over 5 years ago that was treated at [facility]. Past Medical History:  Diagnosis Date  . HIV infection ([place])   . Hypertension     Patient Active Problem List   Diagnosis Date Noted  . 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 HYPERTENSION, BENIGN 01/26/2007  . 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 AIDS 10/26/2006  . HX, PERSONAL, PENICILLIN ALLERGY 10/26/2006  . HX, PERSONAL, DRUG ALLERGY NOS 10/26/2006    Past Surgical History:  Procedure Laterality Date  . ABDOMINAL HYSTERECTOMY    . APPENDECTOMY      OB History    Gravida Para Term Preterm AB Living   '1 1 1     1   ' SAB TAB Ectopic Multiple Live Births                   Home Medications    Prior to Admission medications   Medication Sig Start Date End Date Taking? Authorizing Provider  amLODipine (NORVASC) 10 MG tablet Take 1 tablet (10 mg total) by mouth daily. Start with 1/2 tab per day x 4 days, then resume 1 tab daily 09/02/16  Yes Carlyle Basques, MD  atazanavir-cobicistat (EVOTAZ) 300-150 MG tablet Take 1 tablet by mouth daily. Swallow whole. Do NOT crush, cut or chew tablet. Take with food. 06/02/16  Yes Carlyle Basques, MD  dolutegravir (TIVICAY) 50 MG tablet Take 1 tablet (50 mg total) by mouth 2 (two) times daily. 06/02/16  Yes Carlyle Basques, MD  lisinopril (PRINIVIL,ZESTRIL) 10 MG tablet  Take 1 tablet (10 mg total) by mouth daily. 09/02/16  Yes Carlyle Basques, MD  zidovudine (RETROVIR) 300 MG tablet Take 1 tablet (300 mg total) by mouth 2 (two) times daily. 06/02/16  Yes Carlyle Basques, MD  Blood Pressure Monitoring (ADULT BLOOD PRESSURE CUFF LG) KIT      Historical Provider, MD    Family History Family History  Problem Relation Age of Onset  . Hypertension Mother   . Cancer Father     pancreatic  . Breast cancer Sister     Social History Social History  Substance Use Topics  . Smoking status: Former Smoker    Packs/day: 0.10    Types: Cigarettes  . Smokeless tobacco: Never Used  . Alcohol use No     Allergies   Penicillins   Review of Systems Review of Systems 10 Systems reviewed and are negative for acute change except as noted in the HPI.   Physical Exam Updated Vital Signs BP 137/65 (BP Location: Left Arm)   Pulse 93   Temp 99.1 F (37.3 C) (Oral)   Resp 13   Ht '5\' 2"'  (1.575 m)   Wt 135 lb (61.2 kg)   SpO2 100%   BMI 24.69 kg/m   Physical Exam  Constitutional: She is oriented to person, place, and time. She appears  well-developed and well-nourished. No distress.  HENT:  Head: Normocephalic and atraumatic.  Mouth/Throat: Oropharynx is clear and moist.  Eyes: Conjunctivae are normal.  Neck: Neck supple.  Cardiovascular: Normal rate and regular rhythm.   No murmur heard. Pulmonary/Chest: Effort normal and breath sounds normal. No respiratory distress.  Abdominal: Soft. There is tenderness.  Positive right lower quadrant and flank pain to percussion.  Musculoskeletal: She exhibits no edema or tenderness.  Neurological: She is alert and oriented to person, place, and time. No cranial nerve deficit. She exhibits normal muscle tone. Coordination normal.  Skin: Skin is warm and dry.  Psychiatric: She has a normal mood and affect.  Nursing note and vitals reviewed.    ED Treatments / Results  Labs (all labs ordered are listed, but only  abnormal results are displayed) Labs Reviewed  COMPREHENSIVE METABOLIC PANEL - Abnormal; Notable for the following:       Result Value   Glucose, Bld 126 (*)    Creatinine, Ser 1.12 (*)    GFR calc non Af Amer 52 (*)    All other components within normal limits  CBC - Abnormal; Notable for the following:    WBC 14.7 (*)    All other components within normal limits  URINALYSIS, ROUTINE W REFLEX MICROSCOPIC - Abnormal; Notable for the following:    APPearance TURBID (*)    Hgb urine dipstick LARGE (*)    Protein, ur >=300 (*)    Leukocytes, UA LARGE (*)    Squamous Epithelial / LPF 0-5 (*)    All other components within normal limits  URINE CULTURE  LIPASE, BLOOD    EKG  EKG Interpretation None       Radiology Ct Renal Stone Study  Result Date: 10/26/2016 CLINICAL DATA:  Right flank pain, nausea and vomiting. Prior appendectomy and hysterectomy. EXAM: CT ABDOMEN AND PELVIS WITHOUT CONTRAST TECHNIQUE: Multidetector CT imaging of the abdomen and pelvis was performed following the standard protocol without IV contrast. COMPARISON:  None. FINDINGS: Lower chest: No significant pulmonary nodules or acute consolidative airspace disease. Coronary atherosclerosis. Hepatobiliary: Diffuse hepatic steatosis. No definite liver surface irregularity. No liver mass. Cholelithiasis, with no gallbladder wall thickening, significant gallbladder distention or pericholecystic fluid. No biliary ductal dilatation. Pancreas: Normal, with no mass or duct dilation. Spleen: Normal size. No mass. Adrenals/Urinary Tract: Normal adrenals. Obstructing 11 x 7 mm stone in the proximal pelvic segment of the right ureter with severe right hydroureteronephrosis. No additional right renal stones. Asymmetric right perinephric fat stranding. Asymmetric parenchymal atrophy in the right kidney. Small simple right renal cysts, largest 1.8 cm in the peripheral interpolar right kidney. Hyperdense 0.4 cm renal cortical lesion in  the anteromedial upper left kidney, too small to characterize, for which no further follow-up is required. No additional contour deforming renal lesions. No stones in the renal collecting systems. No left hydronephrosis. Normal caliber left ureter, with no left ureteral stones. Normal bladder, with no bladder stones. Stomach/Bowel: Grossly normal stomach. Normal caliber small bowel with no small bowel wall thickening. Appendectomy . Normal large bowel with no diverticulosis, large bowel wall thickening or pericolonic fat stranding. Vascular/Lymphatic: Atherosclerotic nonaneurysmal abdominal aorta. No pathologically enlarged lymph nodes in the abdomen or pelvis. Reproductive: Status post hysterectomy, with no abnormal findings at the vaginal cuff. No adnexal mass. Other: No pneumoperitoneum, ascites or focal fluid collection. Musculoskeletal: No aggressive appearing focal osseous lesions. Nonspecific small sclerotic lesion in the superior right acetabulum, probably a benign bone island. IMPRESSION: 1. Obstructing 11  x 7 mm stone in the proximal pelvic segment of the right ureter, with severe right hydroureteronephrosis. Asymmetric right renal parenchymal atrophy suggests some chronicity to this process, although there is asymmetric right perinephric fat stranding to suggest an acute component. 2. Additional findings include aortic atherosclerosis, coronary atherosclerosis, diffuse hepatic steatosis, cholelithiasis and small simple right renal cysts. Electronically Signed   By: Ilona Sorrel M.D.   On: 10/26/2016 16:53    Procedures Procedures (including critical care time)  Medications Ordered in ED Medications  0.9 %  sodium chloride infusion (1,000 mLs Intravenous New Bag/Given 10/26/16 1608)    Followed by  0.9 %  sodium chloride infusion (1,000 mLs Intravenous New Bag/Given 10/26/16 1608)  HYDROmorphone (DILAUDID) injection 1 mg (1 mg Intravenous Given 10/26/16 1608)  ondansetron (ZOFRAN) injection 4 mg (4  mg Intravenous Given 10/26/16 1608)  cefTRIAXone (ROCEPHIN) 1 g in dextrose 5 % 50 mL IVPB (1 g Intravenous New Bag/Given 10/26/16 1608)     Initial Impression / Assessment and Plan / ED Course  I have reviewed the triage vital signs and the nursing notes.  Pertinent labs & imaging results that were available during my care of the patient were reviewed by me and considered in my medical decision making (see chart for details).     Consult: Reviewed diagnostic results and history of present illness with Dr.Ottelin (17:23). He advises to initiate ciprofloxacin and have the patient follow-up with him in the office tomorrow.  Final Clinical Impressions(s) / ED Diagnoses   Final diagnoses:  Hydronephrosis with urinary obstruction due to ureteral calculus   Patient has acute onset of flank pain. 11 mm x 7 mm stone confirmed with hydronephrosis. Patient does not have fever or vital sign instability. She is alert and appropriate. Patient's pain controlled with 1 mg Dilaudid IV. After consultation with urology, plan will be for follow-up tomorrow with the patient being treated with ciprofloxacin and pain medication. New Prescriptions New Prescriptions   No medications on file     Charlesetta Shanks, MD 10/26/16 [id number]

## 2016-10-26 NOTE — ED Triage Notes (Signed)
Abd pain with N/V yesterday x 4, able to keep coffee this am. Denies diarrrhea. At present comfortable in triage

## 2016-10-26 NOTE — ED Notes (Addendum)
Tech advised that pt sts she felt better and wanted to leave. Tech [place] provided pt with crackers and ginger ale. Pt left with family and was in no distress. Dr Vallery Ridge made aware.

## 2016-10-26 NOTE — ED Notes (Signed)
Pt ambulates to BR and back to room w/o assistance

## 2016-10-28 ENCOUNTER — Telehealth: Payer: Self-pay | Admitting: *Deleted

## 2016-10-28 NOTE — Telephone Encounter (Signed)
Patient called to let Dr. Baxter Flattery know that she was seen at [facility] ED on 10/26/16 and dx with kidney stones and UTI. Her son brought her and stayed in the exam room with her. Someone said, we see you take Tivicay and Evotaz for your Hiv. She was upset because her son does not know she is positive. She told her son that she was tested once and it was undetectable.She has an appt at [facility] on 10/30/16 and was worried that if her son brings her, they may mention her Hiv again. Advised patient to have her son wait in the waiting room, as the MD will need to discuss her medical history. Also told her she can file a complaint about what happened.

## 2016-10-29 LAB — URINE CULTURE: Culture: >=100000 — AB

## 2016-10-30 ENCOUNTER — Telehealth: Payer: Self-pay | Admitting: Emergency Medicine

## 2016-10-30 NOTE — Progress Notes (Addendum)
ED Antimicrobial Stewardship Positive Culture Follow Up   Veronica Robles is an 61 y.o. female who presented to [facility] on 10/26/2016 with a chief complaint of  Chief Complaint  Patient presents with  . Abdominal Pain  . Emesis    Recent Results (from the past 720 hour(s))  Urine culture     Status: Abnormal   Collection Time: 10/26/16  2:25 PM  Result Value Ref Range Status   Specimen Description URINE, RANDOM  Final   Special Requests NONE  Final   Culture >=100,000 COLONIES/mL ESCHERICHIA COLI (A)  Final   Report Status 10/29/2016 FINAL  Final   Organism ID, Bacteria ESCHERICHIA COLI (A)  Final      Susceptibility   Escherichia coli - MIC*    AMPICILLIN <=2 SENSITIVE Sensitive     CEFAZOLIN <=4 SENSITIVE Sensitive     CEFTRIAXONE <=1 SENSITIVE Sensitive     CIPROFLOXACIN <=0.25 SENSITIVE Sensitive     GENTAMICIN <=1 SENSITIVE Sensitive     IMIPENEM <=0.25 SENSITIVE Sensitive     NITROFURANTOIN <=16 SENSITIVE Sensitive     TRIMETH/SULFA <=20 SENSITIVE Sensitive     AMPICILLIN/SULBACTAM <=2 SENSITIVE Sensitive     PIP/TAZO <=4 SENSITIVE Sensitive     Extended ESBL NEGATIVE Sensitive     * >=100,000 COLONIES/mL ESCHERICHIA COLI     [x]  Patient discharged originally without antimicrobial agent and treatment is now indicated  New antibiotic prescription: Take ciprofloxacin 500 mg PO BID x 7 days.    ED Provider: Janetta Hora, PA-C  Marylou Flesher, PharmD Candidate 10/30/2016, 8:59 AM  I agree with the above assessment and plan. Patient to start ciprofloxacin 500 mg BID x 7 days.  Dimitri Ped, PharmD, BCPS PGY-2 Infectious Diseases Pharmacy Resident Pager: [phone] 10/30/2016, 9:54 AM

## 2016-10-30 NOTE — Telephone Encounter (Signed)
Post ED Visit - Positive Culture Follow-up: Successful Patient Follow-Up  Culture assessed and recommendations reviewed by: []  Elenor Quinones, Pharm.D. []  Heide Guile, Pharm.D., BCPS AQ-ID []  Parks Neptune, Pharm.D., BCPS []  Alycia Rossetti, Pharm.D., BCPS []  [place], Pharm.D., BCPS, AAHIVP []  Legrand Como, Pharm.D., BCPS, AAHIVP []  Salome Arnt, PharmD, BCPS [x]  Dimitri Ped, PharmD, BCPS []  Vincenza Hews, PharmD, BCPS  Positive urine culture  [x]  Patient discharged without antimicrobial prescription and treatment is now indicated []  Organism is resistant to prescribed ED discharge antimicrobial []  Patient with positive blood cultures  Changes discussed with ED provider: Janetta Hora PA New antibiotic prescription Start Ciprofloxacin 500mg  po q 12 hours x 7 days Called to [facility] Patient also verbalizes that she has appt with urologist @ 1530 today  Contacted patient, 10/30/2016 1410   Veronica Robles 10/30/2016, 2:14 PM

## 2016-11-03 ENCOUNTER — Other Ambulatory Visit: Payer: Self-pay | Admitting: Urology

## 2016-11-04 ENCOUNTER — Other Ambulatory Visit: Payer: Self-pay | Admitting: Urology

## 2016-11-04 MED ORDER — CIPROFLOXACIN IN D5W 400 MG/200ML IV SOLN
400.0000 mg | Freq: Two times a day (BID) | INTRAVENOUS | Status: DC
  Administered 2016-11-14 – 2022-03-26 (×2): 400 mg via INTRAVENOUS

## 2016-11-05 ENCOUNTER — Other Ambulatory Visit: Payer: Self-pay | Admitting: Internal Medicine

## 2016-11-05 DIAGNOSIS — Z1231 Encounter for screening mammogram for malignant neoplasm of breast: Secondary | ICD-10-CM

## 2016-11-07 NOTE — Patient Instructions (Addendum)
Veronica Robles  11/07/2016   Your procedure is scheduled on: 11-14-16  Report to [facility]  Entrance Take [place] Elevators to 3rd floor to [place] at [place] AM.    Call this number if you have problems the morning of surgery [phone]    Remember: ONLY 1 PERSON MAY GO WITH YOU TO SHORT STAY TO GET  READY MORNING OF [place].  Do not eat food or drink liquids :After Midnight.     Take these medicines the morning of surgery with A SIP OF WATER: Amlodipine (Norvasc), Atazanavir-Cobicistat (Evotaz), Dolutegravir (Tivicay) and  Zidovudine (Retrovir)                                You may not have any metal on your body including hair pins and              piercings  Do not wear jewelry, make-up, lotions, powders or perfumes, deodorant             Do not wear nail polish.  Do not shave  48 hours prior to surgery.                 Do not bring valuables to the hospital. [place].  Contacts, dentures or bridgework may not be worn into surgery.      Patients discharged the day of surgery will not be allowed to drive home.  Name and phone number of your driver: [place] [phone]   Special Instructions: N/A              Please read over the following fact sheets you were given: _____________________________________________________________________             [facility] - Preparing for Surgery Before surgery, you can play an important role.  Because skin is not sterile, your skin needs to be as free of germs as possible.  You can reduce the number of germs on your skin by washing with CHG (chlorahexidine gluconate) soap before surgery.  CHG is an antiseptic cleaner which kills germs and bonds with the skin to continue killing germs even after washing. Please DO NOT use if you have an allergy to CHG or antibacterial soaps.  If your skin becomes reddened/irritated stop using the CHG and  inform your nurse when you arrive at Short Stay. Do not shave (including legs and underarms) for at least 48 hours prior to the first CHG shower.  You may shave your face/neck. Please follow these instructions carefully:  1.  Shower with CHG Soap the night before surgery and the  morning of Surgery.  2.  If you choose to wash your hair, wash your hair first as usual with your  normal  shampoo.  3.  After you shampoo, rinse your hair and body thoroughly to remove the  shampoo.                           4.  Use CHG as you would any other liquid soap.  You can apply chg directly  to the skin and wash  Gently with a scrungie or clean washcloth.  5.  Apply the CHG Soap to your body ONLY FROM THE NECK DOWN.   Do not use on face/ open                           Wound or open sores. Avoid contact with eyes, ears mouth and genitals (private parts).                       Wash face,  Genitals (private parts) with your normal soap.             6.  Wash thoroughly, paying special attention to the area where your surgery  will be performed.  7.  Thoroughly rinse your body with warm water from the neck down.  8.  DO NOT shower/wash with your normal soap after using and rinsing off  the CHG Soap.                9.  Pat yourself dry with a clean towel.            10.  Wear clean pajamas.            11.  Place clean sheets on your bed the night of your first shower and do not  sleep with pets. Day of Surgery : Do not apply any lotions/deodorants the morning of surgery.  Please wear clean clothes to the hospital/surgery center.  FAILURE TO FOLLOW THESE INSTRUCTIONS MAY RESULT IN THE CANCELLATION OF YOUR SURGERY PATIENT SIGNATURE_________________________________  NURSE SIGNATURE__________________________________  ________________________________________________________________________

## 2016-11-11 ENCOUNTER — Encounter (HOSPITAL_COMMUNITY): Payer: Self-pay

## 2016-11-11 ENCOUNTER — Encounter (HOSPITAL_COMMUNITY)
Admission: RE | Admit: 2016-11-11 | Discharge: 2016-11-11 | Disposition: A | Discharge status: Home / Self Care | Payer: Self-pay | Source: Ambulatory Visit | Attending: Urology | Admitting: Urology

## 2016-11-11 DIAGNOSIS — N201 Calculus of ureter: Secondary | ICD-10-CM | POA: Insufficient documentation

## 2016-11-11 DIAGNOSIS — Z01810 Encounter for preprocedural cardiovascular examination: Secondary | ICD-10-CM | POA: Insufficient documentation

## 2016-11-11 DIAGNOSIS — I10 Essential (primary) hypertension: Secondary | ICD-10-CM | POA: Insufficient documentation

## 2016-11-11 DIAGNOSIS — Z01812 Encounter for preprocedural laboratory examination: Secondary | ICD-10-CM | POA: Insufficient documentation

## 2016-11-11 LAB — BASIC METABOLIC PANEL
Anion gap: 6 (ref 5–15)
BUN: 14 mg/dL (ref 6–20)
CHLORIDE: 108 mmol/L (ref 101–111)
CO2: 26 mmol/L (ref 22–32)
CREATININE: 1.02 mg/dL — AB (ref 0.44–1.00)
Calcium: 9.1 mg/dL (ref 8.9–10.3)
GFR calc Af Amer: >60 mL/min (ref >60)
GFR calc non Af Amer: 58 mL/min — ABNORMAL LOW (ref >60)
GLUCOSE: 97 mg/dL (ref 65–99)
Potassium: 4.3 mmol/L (ref 3.5–5.1)
SODIUM: 140 mmol/L (ref 135–145)

## 2016-11-11 LAB — CBC
HCT: 36.5 % (ref 36.0–46.0)
HEMOGLOBIN: 11.7 g/dL — AB (ref 12.0–15.0)
MCH: 30.3 pg (ref 26.0–34.0)
MCHC: 32.1 g/dL (ref 30.0–36.0)
MCV: 94.6 fL (ref 78.0–100.0)
Platelets: 233 10*3/uL (ref 150–400)
RBC: 3.86 MIL/uL — ABNORMAL LOW (ref 3.87–5.11)
RDW: 14.1 % (ref 11.5–15.5)
WBC: 5.7 10*3/uL (ref 4.0–10.5)

## 2016-11-13 NOTE — H&P (Signed)
CC: I have a ureteral stone.  HPI: Veronica Robles is a 61 year-old female patient who is here for a ureteral calculus.  The patient's stone is one her right side. She first noticed the symptoms 10/26/2016.     CC/HPI: CC - Right ureteral stone   61yo female with history of HIV, HTN recently presented to ER last weekend with right flank pain. CT imaging showed large mid right ureteral 55mm calculus with HU 1400-1500. She was treated with Rocephin and discharged after discussion with Dr. Karsten Ro who recommended initiation of cipro. She presents today for follow up. She has not been taking any antibiotic. Urine culture from ER visit showed pan sensitive E coli. WBC was 14. Cr 1.1. She had marked hydronephrosis on the right.   Today she feels better. She has not passed a stone. She is not taking pain medications and prefers not taking these meds due to sedation. She would rather stick with ibuprofen & tylenol OTC. Pain at ER was 9/10, now no pain at all. No recent fevers. No new nausea or emesis, last emesis was 4 days ago. Does endorse frequency, nocturia, intermittent stream, hematuria. Does have a personal history of nephrolithiasis and has had possible USE in past with Dr. Gaynelle Arabian (?). Points to large scar on lower abdomen when asked about renal stone history, so unclear if open surgery or confusion in patient. She has had constipation recently.   No anticoagulation.     ALLERGIES: Penicillin - Skin Rash    MEDICATIONS: Lisinopril 10 mg tablet  Amlodipine Besylate 10 mg tablet  Evotaz 300 mg-150 mg tablet  Zidovudine 300 mg tablet  Zofran 4 mg tablet     GU PSH: No GU PSH    NON-GU PSH: No Non-GU PSH    GU PMH: None   NON-GU PMH: HIV    FAMILY HISTORY: 1 son - Runs in Family   SOCIAL HISTORY: Marital Status: Single Current Smoking Status: Patient does not smoke anymore.   Tobacco Use Assessment Completed: Used Tobacco in last 30 days? Has never drank.  Drinks 1 caffeinated  drink per day.    REVIEW OF SYSTEMS:    GU Review Female:   Patient reports frequent urination, get up at night to urinate, and leakage of urine. Patient denies hard to postpone urination, burning /pain with urination, stream starts and stops, trouble starting your stream, have to strain to urinate, and currently pregnant.  Gastrointestinal (Upper):   Patient denies nausea, vomiting, and indigestion/ heartburn.  Gastrointestinal (Lower):   Patient denies diarrhea and constipation.  Constitutional:   Patient denies fever, night sweats, weight loss, and fatigue.  Skin:   Patient denies skin rash/ lesion and itching.  Eyes:   Patient denies blurred vision and double vision.  Ears/ Nose/ Throat:   Patient denies sore throat and sinus problems.  Hematologic/Lymphatic:   Patient denies swollen glands and easy bruising.  Cardiovascular:   Patient denies chest pains and leg swelling.  Respiratory:   Patient denies cough and shortness of breath.  Endocrine:   Patient denies excessive thirst.  Musculoskeletal:   Patient denies back pain and joint pain.  Neurological:   Patient denies headaches and dizziness.  Psychologic:   Patient denies depression and anxiety.   VITAL SIGNS:      10/30/2016 03:35 PM  Weight 138 lb / 62.6 kg  Height 62 in / 157.48 cm  BP 119/79 mmHg  Pulse 70 /min  BMI 25.2 kg/m   GU PHYSICAL EXAMINATION:  Notes: No CVAT bilaterally   MULTI-SYSTEM PHYSICAL EXAMINATION:    Constitutional: Well-nourished. No physical deformities. Normally developed. Good grooming.  Neck: Neck symmetrical, not swollen. Normal tracheal position.  Respiratory: No labored breathing, no use of accessory muscles.   Cardiovascular: Normal temperature, normal extremity pulses, no swelling, no varicosities.  Lymphatic: No enlargement of neck, axillae, groin.  Skin: No paleness, no jaundice, no cyanosis. No lesion, no ulcer, no rash.  Neurologic / Psychiatric: Oriented to time, oriented to  place, oriented to person. No depression, no anxiety, no agitation.  Gastrointestinal: No mass, no tenderness, no rigidity, non obese abdomen. Large RLQ incisional scar present.  Eyes: Normal conjunctivae. Normal eyelids.  Ears, Nose, Mouth, and Throat: Left ear no scars, no lesions, no masses. Right ear no scars, no lesions, no masses. Nose no scars, no lesions, no masses. Normal hearing. Normal lips.  Musculoskeletal: Normal gait and station of head and neck.     PAST DATA REVIEWED:  Source Of History:  Patient  Lab Test Review:   BUN/Creatinine, CBC with Diff  Records Review:   Previous Hospital Records, POC Tool  Urine Test Review:   Urinalysis  Notes:                     WBC, UA and creatinine as above.   PROCEDURES: None   ASSESSMENT:      ICD-10 Details  1 GU:   Calculus Ureter - N20.1           Notes:   61yo female with history of HTN, HIV with large dense ureteral calculus with significant proximal hydronephrosis on the RIGHT that is likely too large to pass. Currently asymptomatic, however I recommended right sided USE, stent placement, retrograde pyelogram. We discussed risks, benefits and alternatives of USE today. She is eager to proceed with stone treatment.   Prescription for cipro 500 BID x7d given to patient today for positive urine culture.   Of note she has history of HIV and does not wish that this is disclosed with her son.       PLAN:            Medications New Meds: Cipro 500 mg tablet 1 tablet PO BID   #14  0 Refill(s)            Document Letter(s):  Created for Patient: Clinical Summary         Notes:   Next available surgery with resident + any available provider for cystoscopy, right USE, laser lithotripsy and stent placement with retrograde pyelogram.   Start cipro 500mg  BID x7d now.   Patient does not want HIV history discussed with her son.   Dr. Karsten Ro was supervising physician for this visit.    Patient was seen, examined,treatment plan  was discussed with the resident.  I have directly reviewed the clinical findings, lab, imaging studies and management of this patient in detail. I have made the necessary changes and/or additions to the above noted documentation, and agree with the documentation, as recorded by the resident.

## 2016-11-14 ENCOUNTER — Encounter (HOSPITAL_COMMUNITY)
Admission: RE | Disposition: A | Discharge status: Home / Self Care | Payer: Self-pay | Source: Ambulatory Visit | Attending: Urology

## 2016-11-14 ENCOUNTER — Ambulatory Visit (HOSPITAL_COMMUNITY): Payer: Self-pay | Admitting: Certified Registered Nurse Anesthetist

## 2016-11-14 ENCOUNTER — Encounter (HOSPITAL_COMMUNITY): Payer: Self-pay | Admitting: Certified Registered Nurse Anesthetist

## 2016-11-14 ENCOUNTER — Ambulatory Visit (HOSPITAL_COMMUNITY)
Admission: RE | Admit: 2016-11-14 | Discharge: 2016-11-14 | Disposition: A | Discharge status: Home / Self Care | Payer: Self-pay | Source: Ambulatory Visit | Attending: Urology | Admitting: Urology

## 2016-11-14 ENCOUNTER — Ambulatory Visit (HOSPITAL_COMMUNITY): Payer: Self-pay

## 2016-11-14 DIAGNOSIS — Z21 Asymptomatic human immunodeficiency virus [HIV] infection status: Secondary | ICD-10-CM | POA: Insufficient documentation

## 2016-11-14 DIAGNOSIS — Z87891 Personal history of nicotine dependence: Secondary | ICD-10-CM | POA: Insufficient documentation

## 2016-11-14 DIAGNOSIS — N132 Hydronephrosis with renal and ureteral calculous obstruction: Secondary | ICD-10-CM | POA: Insufficient documentation

## 2016-11-14 DIAGNOSIS — I10 Essential (primary) hypertension: Secondary | ICD-10-CM | POA: Insufficient documentation

## 2016-11-14 DIAGNOSIS — K5900 Constipation, unspecified: Secondary | ICD-10-CM | POA: Insufficient documentation

## 2016-11-14 DIAGNOSIS — Z79899 Other long term (current) drug therapy: Secondary | ICD-10-CM | POA: Insufficient documentation

## 2016-11-14 DIAGNOSIS — Z880 Allergy status to penicillin: Secondary | ICD-10-CM | POA: Insufficient documentation

## 2016-11-14 HISTORY — PX: CYSTOSCOPY/RETROGRADE/URETEROSCOPY: SHX5316

## 2016-11-14 SURGERY — CYSTOSCOPY/RETROGRADE/URETEROSCOPY
Anesthesia: General | Laterality: Right

## 2016-11-14 MED ORDER — PHENYLEPHRINE 40 MCG/ML (10ML) SYRINGE FOR IV PUSH (FOR BLOOD PRESSURE SUPPORT)
PREFILLED_SYRINGE | INTRAVENOUS | Status: AC
  Filled 2016-11-14: qty 10

## 2016-11-14 MED ORDER — MIDAZOLAM HCL 2 MG/2ML IJ SOLN
INTRAMUSCULAR | Status: AC
  Filled 2016-11-14: qty 2

## 2016-11-14 MED ORDER — IOHEXOL 300 MG/ML  SOLN
INTRAMUSCULAR | Status: DC | PRN
  Administered 2016-11-14: 1 mL via URETHRAL

## 2016-11-14 MED ORDER — PROPOFOL 10 MG/ML IV BOLUS
INTRAVENOUS | Status: DC | PRN
  Administered 2016-11-14: 150 mg via INTRAVENOUS

## 2016-11-14 MED ORDER — CIPROFLOXACIN IN D5W 400 MG/200ML IV SOLN
400.0000 mg | INTRAVENOUS | Status: DC
  Filled 2016-11-14: qty 200

## 2016-11-14 MED ORDER — FENTANYL CITRATE (PF) 100 MCG/2ML IJ SOLN
INTRAMUSCULAR | Status: AC
  Filled 2016-11-14: qty 2

## 2016-11-14 MED ORDER — MIDAZOLAM HCL 5 MG/5ML IJ SOLN
INTRAMUSCULAR | Status: DC | PRN
  Administered 2016-11-14: 2 mg via INTRAVENOUS

## 2016-11-14 MED ORDER — EPHEDRINE SULFATE 50 MG/ML IJ SOLN
INTRAMUSCULAR | Status: DC | PRN
  Administered 2016-11-14 (×4): 10 mg via INTRAVENOUS

## 2016-11-14 MED ORDER — FENTANYL CITRATE (PF) 100 MCG/2ML IJ SOLN: 25.0000 ug | INTRAMUSCULAR | Status: DC | PRN

## 2016-11-14 MED ORDER — OXYCODONE HCL 5 MG/5ML PO SOLN
5.0000 mg | Freq: Once | ORAL | Status: DC | PRN
  Filled 2016-11-14: qty 5

## 2016-11-14 MED ORDER — SODIUM CHLORIDE 0.9 % IR SOLN
Status: DC | PRN
  Administered 2016-11-14: 6000 mL/h

## 2016-11-14 MED ORDER — ONDANSETRON HCL 4 MG/2ML IJ SOLN
INTRAMUSCULAR | Status: DC | PRN
Start: 2016-11-14 — End: 2016-11-14
  Administered 2016-11-14: 4 mg via INTRAVENOUS

## 2016-11-14 MED ORDER — EPHEDRINE 5 MG/ML INJ
INTRAVENOUS | Status: AC
  Filled 2016-11-14: qty 10

## 2016-11-14 MED ORDER — LIDOCAINE 2% (20 MG/ML) 5 ML SYRINGE
INTRAMUSCULAR | Status: DC | PRN
  Administered 2016-11-14: 100 mg via INTRAVENOUS

## 2016-11-14 MED ORDER — OXYCODONE HCL 5 MG PO TABS: 5.0000 mg | ORAL_TABLET | Freq: Once | ORAL | Status: DC | PRN

## 2016-11-14 MED ORDER — PROPOFOL 10 MG/ML IV BOLUS
INTRAVENOUS | Status: AC
  Filled 2016-11-14: qty 20

## 2016-11-14 MED ORDER — LACTATED RINGERS IV SOLN
INTRAVENOUS | Status: DC
  Administered 2016-11-14 (×2): via INTRAVENOUS

## 2016-11-14 MED ORDER — FENTANYL CITRATE (PF) 100 MCG/2ML IJ SOLN
INTRAMUSCULAR | Status: DC | PRN
  Administered 2016-11-14: 50 ug via INTRAVENOUS
  Administered 2016-11-14 (×2): 25 ug via INTRAVENOUS

## 2016-11-14 MED ORDER — PHENYLEPHRINE HCL 10 MG/ML IJ SOLN
INTRAMUSCULAR | Status: DC | PRN
  Administered 2016-11-14: 80 ug via INTRAVENOUS

## 2016-11-14 MED ORDER — LIDOCAINE 2% (20 MG/ML) 5 ML SYRINGE
INTRAMUSCULAR | Status: AC
  Filled 2016-11-14: qty 5

## 2016-11-14 MED ORDER — ONDANSETRON HCL 4 MG/2ML IJ SOLN: 4.0000 mg | Freq: Four times a day (QID) | INTRAMUSCULAR | Status: DC | PRN

## 2016-11-14 MED ORDER — ONDANSETRON HCL 4 MG/2ML IJ SOLN
INTRAMUSCULAR | Status: AC
  Filled 2016-11-14: qty 2

## 2016-11-14 SURGICAL SUPPLY — 21 items
BAG URO CATCHER STRL LF (MISCELLANEOUS) ×2 IMPLANT
BASKET ZERO TIP 1.9FR (BASKET) ×2 IMPLANT
BASKET ZERO TIP NITINOL 2.4FR (BASKET) ×2 IMPLANT
BSKT STON RTRVL ZERO TP 1.9FR (BASKET) ×1
BSKT STON RTRVL ZERO TP 2.4FR (BASKET) ×2
CATH INTERMIT  6FR 70CM (CATHETERS) ×2 IMPLANT
CLOTH BEACON ORANGE TIMEOUT ST (SAFETY) ×2 IMPLANT
COVER SURGICAL LIGHT HANDLE (MISCELLANEOUS) ×2 IMPLANT
FIBER LASER FLEXIVA 365 (UROLOGICAL SUPPLIES) ×1 IMPLANT
GLOVE BIOGEL M 8.0 STRL (GLOVE) ×2 IMPLANT
GOWN STRL REUS W/TWL XL LVL3 (GOWN DISPOSABLE) ×2 IMPLANT
GUIDEWIRE STR DUAL SENSOR (WIRE) ×3 IMPLANT
IV NS 1000ML (IV SOLUTION) ×2
IV NS 1000ML BAXH (IV SOLUTION) ×1 IMPLANT
MANIFOLD NEPTUNE II (INSTRUMENTS) ×2 IMPLANT
PACK CYSTO (CUSTOM PROCEDURE TRAY) ×2 IMPLANT
SHEATH ACCESS URETERAL 24CM (SHEATH) ×1 IMPLANT
SHEATH ACCESS URETERAL 38CM (SHEATH) IMPLANT
SHEATH ACCESS URETERAL 54CM (SHEATH) IMPLANT
STENT URET 6FRX24 CONTOUR (STENTS) ×1 IMPLANT
TUBING CONNECTING 10 (TUBING) ×2 IMPLANT

## 2016-11-14 NOTE — Op Note (Signed)
Preoperative Diagnosis: Right distal ureteral calculus  Postoperative Diagnosis:  Same  Procedure(s) Performed:   1. Cystourethroscopy 2. Right ureteroscopic stone extraction with laser lithotripsy 3. Right ureteral stent placement, 6Fr x 24cm JJ ureteral stent without dangler 4. Intraoperative fluoroscopy with interpretation <1hr.   Teaching Surgeon:  Kathie Rhodes, MD  Resident Surgeon:  Sharmaine Base, MD  Assistant(s):  None  Anesthesia:  General   Fluids:  See anesthesia record  Estimated blood loss:  10 mL  Specimens:  Right ureteral calculus for stone analysis  Cultures:  None  Drains:  RIGHT 6Fr x 24cm JJ ureteral stent without dangler  Complications:  None  Indications: 61 y.o. patient with a history of HIV, HTN and recent right flank pain with CT imaging showing right hydronephrosis and renal cortical thinning and large distal right ureteral calculus. She was seen in the clinic and ultimately decided on right ureteral stone extraction and stent placement. Risks & benefits of the procedure discussed with the patient, who wishes to proceed.  Findings:  Very large, dense calculus in the right ureter seemingly just distal to the iliac crossing. Successfully fragmented and removed. Consistent with calcium oxalate monohydrate composition. Submucosal area likely secondary to initial wire placement vs laser. Successful ureteral stent placement.   Description:  The patient was correctly identified in the preop holding area where written informed consent as well potential risk and complication reviewed. The patient agreed. They were brought back to the operative suite. Once correct information was verified, general anesthesia was induced. They were then gently placed into dorsal lithotomy position with SCDs in place for VTE prophylaxis. They were prepped and draped in the usual sterile fashion and given appropriate preoperative antibiotics with Cipro. A timeout was then performed.    We inserted a 15F rigid cystoscope per urethra with copious lubrication and normal saline irrigation running. This demonstrated no intravesical stones, foreign bodies, bladder tumors or mucosal irregularities.  We cannulated the right ureteral orifice with the combination of a sensor wire and 5Fr open ended catheter and the sensor wire was advanced into the expected location of the right renal pelvis without difficulty. We were able to visualize a radioopacity on fluoroscopy in the mid to distal right ureter. We then removed the cystoscope leaving our sensor wire in place.  We then advanced a dual channel semirigid ureteroscope and passed this along side the sensor wire and performed distal ureteroscopy which showed a very large brown/yellow stone in the distal to mid right ureter seemingly at the iliac crossing that was somewhat difficult to access with the semirigid ureteroscope.  Wethen obtained a 365 micron holmium laser fiber and performed laser lithotripsy until the stone burden was fragmented into several small fragments which were basket extracted using a zero-tip nitinol wire basket.We switched to flexible ureteroscopy for some time to be able to manipulate the ureter better and access the stone. We also placed a second wire into the kidney under fluoroscopic guidance and passed a 12/14Fr ureteral access sheath to the level of the stone over the wire for easier stone removal.   Stone fragments were collected for chemical analysis. We cleared the entire ureter of all stone burden. There was a minor area near our area of lasering that appeared to show submucosal injury, either from wire placement, stone impaction, or laser site use. Our initial sensor wire was confirmed to be in the right kidney. At this point we elected to leave a ureteral stent and withdrew our instruments leaving a sensor  wire in place. We then advanced a 6Fr x 24cm JJ ureteral stent without dangler with the assistance of  a stent pusher under direct fluoroscopic & visual guidance without difficultly. Sensor wire removal demonstrated satisfactory stent curl proximally in the renal pelvis and distally in the bladder. The bladder was emptied being sure to empty and stone fragments and all instrumentation was removed. The patient was woken up from anesthesia and taken to the recovery unit for routine postoperative care.   Post Op Plan:   -- Follow up 2 weeks for cysto, stent remove -- D/c from PACU when criteria met and voids x 1 -- D/c with Rx's for oxycodone, ditropan, flomax, pyridium -- Cipro 500 BID on day of stent removal prescribed  Attestation:  Dr. Karsten Ro was present and scrubbed for the entirety of the procedure.    Sharmaine Base, MD Resident, Department of Urology     I was present and participated in all aspects of this entire surgical case.

## 2016-11-14 NOTE — Anesthesia Postprocedure Evaluation (Signed)
Anesthesia Post Note  Patient: Veronica Robles  Procedure(s) Performed: Procedure(s) (LRB): CYSTOSCOPY/RETROGRADE/URETEROSCOPY/ STONE EXTRACTION/HOLMIUM LASER/STENT PLACEMENT (Right)  Patient location during evaluation: PACU Anesthesia Type: General Level of consciousness: awake and alert and patient cooperative Pain management: pain level controlled Vital Signs Assessment: post-procedure vital signs reviewed and stable Respiratory status: spontaneous breathing and respiratory function stable Cardiovascular status: stable Anesthetic complications: no       Last Vitals:  Vitals:   11/14/16 1115 11/14/16 1200  BP: 99/60 (!) 97/54  Pulse: 78 (!) 58  Resp:  18  Temp: 36.4 C 36.3 C    Last Pain:  Vitals:   11/14/16 1200  TempSrc: Oral  PainSc:                  [place] S

## 2016-11-14 NOTE — Anesthesia Procedure Notes (Signed)
Procedure Name: LMA Insertion Date/Time: 11/14/2016 8:43 AM Performed by: Maxwell Caul Pre-anesthesia Checklist: Patient identified, Emergency Drugs available and Suction available Patient Re-evaluated:Patient Re-evaluated prior to inductionOxygen Delivery Method: Circle system utilized Preoxygenation: Pre-oxygenation with 100% oxygen Intubation Type: IV induction LMA: LMA inserted LMA Size: 4.0 Number of attempts: 1 Placement Confirmation: positive ETCO2 and breath sounds checked- equal and bilateral Tube secured with: Tape Dental Injury: Teeth and Oropharynx as per pre-operative assessment

## 2016-11-14 NOTE — Transfer of Care (Signed)
Immediate Anesthesia Transfer of Care Note  Patient: Veronica Robles  Procedure(s) Performed: Procedure(s): CYSTOSCOPY/RETROGRADE/URETEROSCOPY/ STONE EXTRACTION/HOLMIUM LASER/STENT PLACEMENT (Right)  Patient Location: PACU  Anesthesia Type:General  Level of Consciousness:  sedated, patient cooperative and responds to stimulation  Airway & Oxygen Therapy:Patient Spontanous Breathing and Patient connected to face mask oxgen  Post-op Assessment:  Report given to PACU RN and Post -op Vital signs reviewed and stable  Post vital signs:  Reviewed and stable  Last Vitals:  Vitals:   11/14/16 0631  BP: 137/70  Pulse: 89  Resp: 16  Temp: 28.4 C    Complications: No apparent anesthesia complications

## 2016-11-14 NOTE — Anesthesia Preprocedure Evaluation (Signed)
Anesthesia Evaluation  Patient identified by MRN, date of birth, ID band Patient awake    Reviewed: Allergy & Precautions, H&P , NPO status , Patient's Chart, lab work & pertinent test results  Airway Mallampati: II   Neck ROM: full    Dental   Pulmonary former smoker,    breath sounds clear to auscultation       Cardiovascular hypertension,  Rhythm:regular Rate:Normal     Neuro/Psych    GI/Hepatic   Endo/Other    Renal/GU      Musculoskeletal   Abdominal   Peds  Hematology  (+) HIV,   Anesthesia Other Findings   Reproductive/Obstetrics                             Anesthesia Physical Anesthesia Plan  ASA: III  Anesthesia Plan: General   Post-op Pain Management:    Induction: Intravenous  Airway Management Planned: LMA  Additional Equipment:   Intra-op Plan:   Post-operative Plan:   Informed Consent: I have reviewed the patients History and Physical, chart, labs and discussed the procedure including the risks, benefits and alternatives for the proposed anesthesia with the patient or authorized representative who has indicated his/her understanding and acceptance.     Plan Discussed with: CRNA, Anesthesiologist and Surgeon  Anesthesia Plan Comments:         Anesthesia Quick Evaluation

## 2016-11-25 ENCOUNTER — Ambulatory Visit
Admission: RE | Admit: 2016-11-25 | Discharge: 2016-11-25 | Disposition: A | Discharge status: Home / Self Care | Payer: No Typology Code available for payment source | Source: Ambulatory Visit | Attending: Internal Medicine | Admitting: Internal Medicine

## 2016-11-25 DIAGNOSIS — Z1231 Encounter for screening mammogram for malignant neoplasm of breast: Secondary | ICD-10-CM

## 2016-12-02 ENCOUNTER — Ambulatory Visit (INDEPENDENT_AMBULATORY_CARE_PROVIDER_SITE_OTHER): Payer: Self-pay | Admitting: Internal Medicine

## 2016-12-02 ENCOUNTER — Encounter: Payer: Self-pay | Admitting: Internal Medicine

## 2016-12-02 VITALS — BP 126/77 | HR 62 | Temp 98.1°F | Wt 135.1 lb

## 2016-12-02 DIAGNOSIS — B20 Human immunodeficiency virus [HIV] disease: Secondary | ICD-10-CM

## 2016-12-02 DIAGNOSIS — Z23 Encounter for immunization: Secondary | ICD-10-CM

## 2016-12-02 DIAGNOSIS — T300 Burn of unspecified body region, unspecified degree: Secondary | ICD-10-CM

## 2016-12-02 LAB — COMPLETE METABOLIC PANEL WITH GFR
ALBUMIN: 3.9 g/dL (ref 3.6–5.1)
ALK PHOS: 82 U/L (ref 33–130)
ALT: 8 U/L (ref 6–29)
AST: 17 U/L (ref 10–35)
BUN: 11 mg/dL (ref 7–25)
CO2: 26 mmol/L (ref 20–31)
Calcium: 9 mg/dL (ref 8.6–10.4)
Chloride: 107 mmol/L (ref 98–110)
Creat: 1.04 mg/dL — ABNORMAL HIGH (ref 0.50–0.99)
GFR, EST AFRICAN AMERICAN: 67 mL/min (ref >=60)
GFR, EST NON AFRICAN AMERICAN: 58 mL/min — AB (ref >=60)
Glucose, Bld: 83 mg/dL (ref 65–99)
Potassium: 4.5 mmol/L (ref 3.5–5.3)
Sodium: 141 mmol/L (ref 135–146)
Total Bilirubin: 0.6 mg/dL (ref 0.2–1.2)
Total Protein: 6.6 g/dL (ref 6.1–8.1)

## 2016-12-02 LAB — CBC WITH DIFFERENTIAL/PLATELET
BASOS ABS: 0 {cells}/uL (ref 0–200)
Basophils Relative: 0 %
EOS ABS: 90 {cells}/uL (ref 15–500)
Eosinophils Relative: 2 %
HCT: 34 % — ABNORMAL LOW (ref 35.0–45.0)
HEMOGLOBIN: 10.8 g/dL — AB (ref 11.7–15.5)
LYMPHS ABS: 1395 {cells}/uL (ref 850–3900)
Lymphocytes Relative: 31 %
MCH: 30.3 pg (ref 27.0–33.0)
MCHC: 31.8 g/dL — ABNORMAL LOW (ref 32.0–36.0)
MCV: 95.5 fL (ref 80.0–100.0)
MONOS PCT: 12 %
MPV: 9.5 fL (ref 7.5–12.5)
Monocytes Absolute: 540 cells/uL (ref 200–950)
NEUTROS ABS: 2475 {cells}/uL (ref 1500–7800)
NEUTROS PCT: 55 %
Platelets: 265 10*3/uL (ref 140–400)
RBC: 3.56 MIL/uL — ABNORMAL LOW (ref 3.80–5.10)
RDW: 15.7 % — ABNORMAL HIGH (ref 11.0–15.0)
WBC: 4.5 10*3/uL (ref 3.8–10.8)

## 2016-12-02 NOTE — Progress Notes (Signed)
RFV: follow up on hiv disease  Patient ID: Veronica Robles, female   DOB: 06-24-1956, 61 y.o.   MRN: [id number]  HPI Veronica Robles is a 61yo F with hiv disease, CD 4 count of 350/VL<20 on tivicay/evotaz/zidovudine. She is virologically controlled as of feb 2018, previously having adherence issues with multiple meds. She had episode of kidney stones and in April she had to help manage right distal ureteral calculus  1. Cystourethroscopy 2. Right ureteroscopic stone extraction with laser lithotripsy 3. Right ureteral stent placement, 6Fr x 24cm JJ ureteral stent without dangler  Had stent removed last week  Had skin injury for burn form heating pad on her back to relieve pain associated with kidney stone  Missed a few doses of HIV meds while acutely ill from kidney stones  Concerned about finances from urology visits  I have reviewed her [facility] records  Social History  Substance Use Topics  . Smoking status: Former Smoker    Packs/day: 0.10    Types: Cigarettes  . Smokeless tobacco: Never Used  . Alcohol use No    Outpatient Encounter Prescriptions as of 12/02/2016  Medication Sig  . amLODipine (NORVASC) 10 MG tablet Take 1 tablet (10 mg total) by mouth daily. Start with 1/2 tab per day x 4 days, then resume 1 tab daily (Patient taking differently: Take 10 mg by mouth daily. )  . atazanavir-cobicistat (EVOTAZ) 300-150 MG tablet Take 1 tablet by mouth daily. Swallow whole. Do NOT crush, cut or chew tablet. Take with food.  . Blood Pressure Monitoring (ADULT BLOOD PRESSURE CUFF LG) KIT    . dolutegravir (TIVICAY) 50 MG tablet Take 1 tablet (50 mg total) by mouth 2 (two) times daily.  Marland Kitchen lisinopril (PRINIVIL,ZESTRIL) 10 MG tablet Take 1 tablet (10 mg total) by mouth daily.  . ondansetron (ZOFRAN ODT) 4 MG disintegrating tablet Take 1 tablet (4 mg total) by mouth every 4 (four) hours as needed for nausea or vomiting. (Patient not taking: Reported on 11/07/2016)  . zidovudine  (RETROVIR) 300 MG tablet Take 1 tablet (300 mg total) by mouth 2 (two) times daily.   Facility-Administered Encounter Medications as of 12/02/2016  Medication  . ciprofloxacin (CIPRO) IVPB 400 mg     Patient Active Problem List   Diagnosis Date Noted  . 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 HYPERTENSION, BENIGN 01/26/2007  . 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 AIDS 10/26/2006  . HX, PERSONAL, PENICILLIN ALLERGY 10/26/2006  . HX, PERSONAL, DRUG ALLERGY NOS 10/26/2006     Health Maintenance Due  Topic Date Due  . COLONOSCOPY  10/19/2005  . TETANUS/TDAP  04/27/2012  . PAP SMEAR  01/23/2015     Review of Systems Feeling better no flank pain, no fever, chills, nightsweat. No diarrhea, no dysuria. Otherwise 12 point ros is negative Physical Exam  BP 126/77 (BP Location: Right Arm, Patient Position: Sitting, Cuff Size: Normal)   Pulse 62   Temp 98.1 F (36.7 C) (Oral)   Wt 135 lb 1.9 oz (61.3 kg)   SpO2 100%   BMI 24.71 kg/m  Physical Exam  Constitutional:  oriented to person, place, and time. appears well-developed and well-nourished. No distress.  HENT: [place]/AT, PERRLA, no scleral icterus Mouth/Throat: Oropharynx is clear and moist. No oropharyngeal exudate.  Cardiovascular: Normal rate, regular rhythm and normal heart sounds. Exam reveals no gallop and no friction rub.  No murmur heard.  Pulmonary/Chest: Effort normal and breath sounds normal. No respiratory distress.  has no wheezes.  Neck = supple, no nuchal rigidity There is no tenderness.  Lymphadenopathy: no cervical adenopathy. No axillary adenopathy Neurological: alert and oriented to person, place, and time.  Skin: Small shallow ulcer roughly 2 cm by 3cm. c/w thermal burn. No surrounding erythema. No exudate in wound bed Psychiatric: a normal mood and affect.  behavior is normal.    Lab Results  Component Value Date   CD4TCELL 19 (L) 09/02/2016   Lab Results  Component Value Date     CD4TABS 350 (L) 09/02/2016   CD4TABS 330 (L) 06/17/2016   CD4TABS 250 (L) 03/04/2016   Lab Results  Component Value Date   HIV1RNAQUANT <20 NOT DETECTED 09/02/2016   Lab Results  Component Value Date   HEPBSAB INDETER (A) 08/08/2009   Lab Results  Component Value Date   LABRPR NON REAC 10/30/2015    CBC Lab Results  Component Value Date   WBC 5.7 11/11/2016   RBC 3.86 (L) 11/11/2016   HGB 11.7 (L) 11/11/2016   HCT 36.5 11/11/2016   PLT 233 11/11/2016   MCV 94.6 11/11/2016   MCH 30.3 11/11/2016   MCHC 32.1 11/11/2016   RDW 14.1 11/11/2016   LYMPHSABS 1,700 06/17/2016   MONOABS 500 06/17/2016   EOSABS 50 06/17/2016    BMET Lab Results  Component Value Date   NA 140 11/11/2016   K 4.3 11/11/2016   CL 108 11/11/2016   CO2 26 11/11/2016   GLUCOSE 97 11/11/2016   BUN 14 11/11/2016   CREATININE 1.02 (H) 11/11/2016   CALCIUM 9.1 11/11/2016   GFRNONAA 58 (L) 11/11/2016   GFRAA >60 11/11/2016      Assessment and Plan  hiv disease= will check labs to see if virologically controlled. Still plan to continue on current regimen  Superficial skin injury = thermal burn with blister that is denuded skin exposure. No signs of infection  Health maintenance = will give tdap

## 2016-12-03 LAB — T-HELPER CELL (CD4) - (RCID CLINIC ONLY)
CD4 % Helper T Cell: 23 % — ABNORMAL LOW (ref 33–55)
CD4 T Cell Abs: 290 /uL — ABNORMAL LOW (ref 400–2700)

## 2016-12-04 LAB — HIV-1 RNA QUANT-NO REFLEX-BLD
HIV 1 RNA QUANT: NOT DETECTED {copies}/mL
HIV-1 RNA QUANT, LOG: NOT DETECTED {Log_copies}/mL

## 2017-01-31 ENCOUNTER — Other Ambulatory Visit: Payer: Self-pay | Admitting: Internal Medicine

## 2017-01-31 DIAGNOSIS — B20 Human immunodeficiency virus [HIV] disease: Secondary | ICD-10-CM

## 2017-01-31 DIAGNOSIS — I10 Essential (primary) hypertension: Secondary | ICD-10-CM

## 2017-03-10 ENCOUNTER — Encounter: Payer: Self-pay | Admitting: Internal Medicine

## 2017-03-10 ENCOUNTER — Ambulatory Visit (INDEPENDENT_AMBULATORY_CARE_PROVIDER_SITE_OTHER): Payer: Self-pay | Admitting: Internal Medicine

## 2017-03-10 VITALS — BP 157/82 | HR 69 | Temp 97.6°F | Wt 137.0 lb

## 2017-03-10 DIAGNOSIS — I10 Essential (primary) hypertension: Secondary | ICD-10-CM

## 2017-03-10 DIAGNOSIS — B20 Human immunodeficiency virus [HIV] disease: Secondary | ICD-10-CM

## 2017-03-10 DIAGNOSIS — Z79899 Other long term (current) drug therapy: Secondary | ICD-10-CM

## 2017-03-10 NOTE — Progress Notes (Signed)
RFV: follow up for hiv disease  Patient ID: Veronica Robles, female   DOB: [birth year], 61 y.o.   MRN: [id number]  HPI Veronica Robles is a 61yo F with well controlled HIV disease, CD 4 count of 290/VL 23 currently on tivicay/evotaz/ZDV.  Having a fantastic summer. Has been in good health. Doing well with adherence, only missed 2 doses in the last month  Outpatient Encounter Prescriptions as of 03/10/2017  Medication Sig  . atazanavir-cobicistat (EVOTAZ) 300-150 MG tablet Take 1 tablet by mouth daily. Swallow whole. Do NOT crush, cut or chew tablet. Take with food.  Marland Kitchen lisinopril (PRINIVIL,ZESTRIL) 10 MG tablet TAKE 1 TABLET BY MOUTH EVERY DAY  . TIVICAY 50 MG tablet TAKE 1 TABLET(50 MG) BY MOUTH TWICE DAILY  . zidovudine (RETROVIR) 300 MG tablet Take 1 tablet (300 mg total) by mouth 2 (two) times daily.  . Blood Pressure Monitoring (ADULT BLOOD PRESSURE CUFF LG) KIT     Facility-Administered Encounter Medications as of 03/10/2017  Medication  . ciprofloxacin (CIPRO) IVPB 400 mg     Patient Active Problem List   Diagnosis Date Noted  . 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 HYPERTENSION, BENIGN 01/26/2007  . 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 AIDS 10/26/2006  . HX, PERSONAL, PENICILLIN ALLERGY 10/26/2006  . HX, PERSONAL, DRUG ALLERGY NOS 10/26/2006     Health Maintenance Due  Topic Date Due  . COLONOSCOPY  10/19/2005  . PAP SMEAR  01/23/2015  . INFLUENZA VACCINE  02/25/2017     soc hx: no drinking or smoking  Review of Systems Review of Systems  Constitutional: Negative for fever, chills, diaphoresis, activity change, appetite change, fatigue and unexpected weight change.  HENT: Negative for congestion, sore throat, rhinorrhea, sneezing, trouble swallowing and sinus pressure.  Eyes: Negative for photophobia and visual disturbance.  Respiratory: Negative for cough, chest tightness, shortness of breath, wheezing and stridor.  Cardiovascular: Negative  for chest pain, palpitations and leg swelling.  Gastrointestinal: Negative for nausea, vomiting, abdominal pain, diarrhea, constipation, blood in stool, abdominal distention and anal bleeding.  Genitourinary: Negative for dysuria, hematuria, flank pain and difficulty urinating.  Musculoskeletal: Negative for myalgias, back pain, joint swelling, arthralgias and gait problem.  Skin: Negative for color change, pallor, rash and wound.  Neurological: Negative for dizziness, tremors, weakness and light-headedness.  Hematological: Negative for adenopathy. Does not bruise/bleed easily.  Psychiatric/Behavioral: Negative for behavioral problems, confusion, sleep disturbance, dysphoric mood, decreased concentration and agitation.    Physical Exam   BP (!) 157/82   Pulse 69   Temp 97.6 F (36.4 C) (Oral)   Wt 137 lb (62.1 kg)   BMI 25.06 kg/m   Physical Exam  Constitutional:  oriented to person, place, and time. appears well-developed and well-nourished. No distress.  HENT: [place]/AT, PERRLA, no scleral icterus Mouth/Throat: Oropharynx is clear and moist. No oropharyngeal exudate.  Cardiovascular: Normal rate, regular rhythm and normal heart sounds. Exam reveals no gallop and no friction rub.  No murmur heard.  Pulmonary/Chest: Effort normal and breath sounds normal. No respiratory distress.  has no wheezes.  Neck = supple, no nuchal rigidity Abdominal: Soft. Bowel sounds are normal.  exhibits no distension. There is no tenderness.  Lymphadenopathy: no cervical adenopathy. No axillary adenopathy Neurological: alert and oriented to person, place, and time.  Skin: Skin is warm and dry. No rash noted. No erythema.  Psychiatric: a normal mood and affect.  behavior is normal.   Lab Results  Component Value Date   CD4TCELL  23 (L) 12/02/2016   Lab Results  Component Value Date   CD4TABS 290 (L) 12/02/2016   CD4TABS 350 (L) 09/02/2016   CD4TABS 330 (L) 06/17/2016   Lab Results  Component Value  Date   HIV1RNAQUANT <20 NOT DETECTED 12/02/2016   Lab Results  Component Value Date   HEPBSAB INDETER (A) 08/08/2009   Lab Results  Component Value Date   LABRPR NON REAC 10/30/2015    CBC Lab Results  Component Value Date   WBC 4.5 12/02/2016   RBC 3.56 (L) 12/02/2016   HGB 10.8 (L) 12/02/2016   HCT 34.0 (L) 12/02/2016   PLT 265 12/02/2016   MCV 95.5 12/02/2016   MCH 30.3 12/02/2016   MCHC 31.8 (L) 12/02/2016   RDW 15.7 (H) 12/02/2016   LYMPHSABS 1,395 12/02/2016   MONOABS 540 12/02/2016   EOSABS 90 12/02/2016    BMET Lab Results  Component Value Date   NA 141 12/02/2016   K 4.5 12/02/2016   CL 107 12/02/2016   CO2 26 12/02/2016   GLUCOSE 83 12/02/2016   BUN 11 12/02/2016   CREATININE 1.04 (H) 12/02/2016   CALCIUM 9.0 12/02/2016   GFRNONAA 58 (L) 12/02/2016   GFRAA 67 12/02/2016      Assessment and Plan  hiv disease= continue on current regimen and great adherence. We will check lab work today  hypertension = on repeat, she is well controlled  Long term medication = cr and bilirubin  will be checked to ensure still at baseline  Health maintenance =flu in oct

## 2017-03-11 LAB — T-HELPER CELL (CD4) - (RCID CLINIC ONLY)
CD4 T CELL HELPER: 22 % — AB (ref 33–55)
CD4 T Cell Abs: 460 /uL (ref 400–2700)

## 2017-03-12 LAB — HIV-1 RNA QUANT-NO REFLEX-BLD
HIV 1 RNA Quant: <20 copies/mL
HIV-1 RNA Quant, Log: <1.3 Log copies/mL

## 2017-03-18 ENCOUNTER — Encounter: Payer: Self-pay | Admitting: Internal Medicine

## 2017-05-01 ENCOUNTER — Other Ambulatory Visit: Payer: Self-pay | Admitting: Internal Medicine

## 2017-05-01 DIAGNOSIS — B20 Human immunodeficiency virus [HIV] disease: Secondary | ICD-10-CM

## 2017-05-01 MED ORDER — DOLUTEGRAVIR SODIUM 50 MG PO TABS: ORAL_TABLET | ORAL | 6 refills | Status: DC

## 2017-05-01 MED ORDER — ATAZANAVIR-COBICISTAT 300-150 MG PO TABS: 1.0000 | ORAL_TABLET | Freq: Every day | ORAL | 6 refills | Status: DC

## 2017-05-11 ENCOUNTER — Ambulatory Visit (INDEPENDENT_AMBULATORY_CARE_PROVIDER_SITE_OTHER): Payer: Self-pay | Admitting: Internal Medicine

## 2017-05-11 ENCOUNTER — Encounter: Payer: Self-pay | Admitting: Internal Medicine

## 2017-05-11 VITALS — BP 160/80 | HR 71 | Temp 98.0°F | Wt 134.0 lb

## 2017-05-11 DIAGNOSIS — Z23 Encounter for immunization: Secondary | ICD-10-CM

## 2017-05-11 DIAGNOSIS — B9689 Other specified bacterial agents as the cause of diseases classified elsewhere: Secondary | ICD-10-CM

## 2017-05-11 DIAGNOSIS — K2970 Gastritis, unspecified, without bleeding: Secondary | ICD-10-CM

## 2017-05-11 DIAGNOSIS — B20 Human immunodeficiency virus [HIV] disease: Secondary | ICD-10-CM

## 2017-05-11 NOTE — Progress Notes (Signed)
RFV: follow up for hiv disease Patient ID: Veronica Robles, female   DOB: [birth year], 61 y.o.   MRN: [id number]  HPI 61yo F with hiv disease, well controlled. Who recently had  had food poisoning and missed 2 doses back to back last month  Then missed one dose last Thursday due to loss of power/strom  Otherwise doing well with adherence  Outpatient Encounter Prescriptions as of 05/11/2017  Medication Sig  . atazanavir-cobicistat (EVOTAZ) 300-150 MG tablet Take 1 tablet by mouth daily. Swallow whole. Do NOT crush, cut or chew tablet. Take with food.  . Blood Pressure Monitoring (ADULT BLOOD PRESSURE CUFF LG) KIT    . dolutegravir (TIVICAY) 50 MG tablet TAKE 1 TABLET(50 MG) BY MOUTH TWICE DAILY  . lisinopril (PRINIVIL,ZESTRIL) 10 MG tablet TAKE 1 TABLET BY MOUTH EVERY DAY  . zidovudine (RETROVIR) 300 MG tablet Take 1 tablet (300 mg total) by mouth 2 (two) times daily.  . [DISCONTINUED] atazanavir-cobicistat (EVOTAZ) 300-150 MG tablet Take 1 tablet by mouth daily. Swallow whole. Do NOT crush, cut or chew tablet. Take with food.  . [DISCONTINUED] TIVICAY 50 MG tablet TAKE 1 TABLET(50 MG) BY MOUTH TWICE DAILY  . [DISCONTINUED] zidovudine (RETROVIR) 300 MG tablet TAKE 1 TABLET(300 MG) BY MOUTH TWICE DAILY   Facility-Administered Encounter Medications as of 05/11/2017  Medication  . ciprofloxacin (CIPRO) IVPB 400 mg     Patient Active Problem List   Diagnosis Date Noted  . 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 HYPERTENSION, BENIGN 01/26/2007  . 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 AIDS 10/26/2006  . HX, PERSONAL, PENICILLIN ALLERGY 10/26/2006  . HX, PERSONAL, DRUG ALLERGY NOS 10/26/2006     Health Maintenance Due  Topic Date Due  . COLONOSCOPY  10/19/2005  . PAP SMEAR  01/23/2015  . INFLUENZA VACCINE  02/25/2017     Review of Systems Feeling better since having gastritis. 10 point ros Physical Exam   BP (!) 160/80   Pulse 71   Temp 98  F (36.7 C) (Oral)   Wt 134 lb (60.8 kg)   BMI 24.51 kg/m   Physical Exam  Constitutional:  oriented to person, place, and time. appears well-developed and well-nourished. No distress.  HENT: [place]/AT, PERRLA, no scleral icterus Mouth/Throat: Oropharynx is clear and moist. No oropharyngeal exudate.  Cardiovascular: Normal rate, regular rhythm and normal heart sounds. Exam reveals no gallop and no friction rub.  No murmur heard.  Pulmonary/Chest: Effort normal and breath sounds normal. No respiratory distress.  has no wheezes.  Neck = supple, no nuchal rigidity Abdominal: Soft. Bowel sounds are normal.  exhibits no distension. There is no tenderness.  Lymphadenopathy: no cervical adenopathy. No axillary adenopathy Neurological: alert and oriented to person, place, and time.  Skin: Skin is warm and dry. No rash noted. No erythema.  Psychiatric: a normal mood and affect.  behavior is normal.   Lab Results  Component Value Date   CD4TCELL 22 (L) 03/10/2017   Lab Results  Component Value Date   CD4TABS 460 03/10/2017   CD4TABS 290 (L) 12/02/2016   CD4TABS 350 (L) 09/02/2016   Lab Results  Component Value Date   HIV1RNAQUANT <20 NOT DETECTED 03/10/2017   Lab Results  Component Value Date   HEPBSAB INDETER (A) 08/08/2009   Lab Results  Component Value Date   LABRPR NON REAC 10/30/2015    CBC Lab Results  Component Value Date   WBC 4.5 12/02/2016   RBC 3.56 (L) 12/02/2016  HGB 10.8 (L) 12/02/2016   HCT 34.0 (L) 12/02/2016   PLT 265 12/02/2016   MCV 95.5 12/02/2016   MCH 30.3 12/02/2016   MCHC 31.8 (L) 12/02/2016   RDW 15.7 (H) 12/02/2016   LYMPHSABS 1,395 12/02/2016   MONOABS 540 12/02/2016   EOSABS 90 12/02/2016    BMET Lab Results  Component Value Date   NA 141 12/02/2016   K 4.5 12/02/2016   CL 107 12/02/2016   CO2 26 12/02/2016   GLUCOSE 83 12/02/2016   BUN 11 12/02/2016   CREATININE 1.04 (H) 12/02/2016   CALCIUM 9.0 12/02/2016   GFRNONAA 58 (L)  12/02/2016   GFRAA 67 12/02/2016      Assessment and Plan  hiv disease = -continue with taking current regimen  Health maintenance -get flu shot  gastritits = missed a few doses while being ill. Concern she may have low lever viremia. Repeat labs in 2 months, visit at at 10 wk

## 2017-05-11 NOTE — Patient Instructions (Signed)
-  come get labs 1 wk prior to next visit

## 2017-06-06 ENCOUNTER — Other Ambulatory Visit: Payer: Self-pay | Admitting: Internal Medicine

## 2017-06-06 DIAGNOSIS — I10 Essential (primary) hypertension: Secondary | ICD-10-CM

## 2017-06-30 ENCOUNTER — Other Ambulatory Visit: Payer: Self-pay

## 2017-06-30 DIAGNOSIS — B20 Human immunodeficiency virus [HIV] disease: Secondary | ICD-10-CM

## 2017-06-30 LAB — CBC WITH DIFFERENTIAL/PLATELET
Basophils Absolute: 31 cells/uL (ref 0–200)
Basophils Relative: 0.5 %
EOS PCT: 1.5 %
Eosinophils Absolute: 92 cells/uL (ref 15–500)
HCT: 35.8 % (ref 35.0–45.0)
Hemoglobin: 11.9 g/dL (ref 11.7–15.5)
Lymphs Abs: 2013 cells/uL (ref 850–3900)
MCH: 29.9 pg (ref 27.0–33.0)
MCHC: 33.2 g/dL (ref 32.0–36.0)
MCV: 89.9 fL (ref 80.0–100.0)
MPV: 10.3 fL (ref 7.5–12.5)
Monocytes Relative: 8.2 %
NEUTROS PCT: 56.8 %
Neutro Abs: 3465 cells/uL (ref 1500–7800)
PLATELETS: 218 10*3/uL (ref 140–400)
RBC: 3.98 10*6/uL (ref 3.80–5.10)
RDW: 14.7 % (ref 11.0–15.0)
TOTAL LYMPHOCYTE: 33 %
WBC: 6.1 10*3/uL (ref 3.8–10.8)
WBCMIX: 500 {cells}/uL (ref 200–950)

## 2017-06-30 LAB — COMPLETE METABOLIC PANEL WITH GFR
AG Ratio: 1.5 (calc) (ref 1.0–2.5)
ALBUMIN MSPROF: 4.1 g/dL (ref 3.6–5.1)
ALKALINE PHOSPHATASE (APISO): 93 U/L (ref 33–130)
ALT: 16 U/L (ref 6–29)
AST: 20 U/L (ref 10–35)
BUN / CREAT RATIO: 10 (calc) (ref 6–22)
BUN: 11 mg/dL (ref 7–25)
CHLORIDE: 105 mmol/L (ref 98–110)
CO2: 29 mmol/L (ref 20–32)
Calcium: 9.3 mg/dL (ref 8.6–10.4)
Creat: 1.06 mg/dL — ABNORMAL HIGH (ref 0.50–0.99)
GFR, Est African American: 66 mL/min/{1.73_m2} (ref >=60)
GFR, Est Non African American: 57 mL/min/{1.73_m2} — ABNORMAL LOW (ref >=60)
GLOBULIN: 2.8 g/dL (ref 1.9–3.7)
Glucose, Bld: 108 mg/dL — ABNORMAL HIGH (ref 65–99)
Potassium: 4.2 mmol/L (ref 3.5–5.3)
SODIUM: 140 mmol/L (ref 135–146)
Total Bilirubin: 0.5 mg/dL (ref 0.2–1.2)
Total Protein: 6.9 g/dL (ref 6.1–8.1)

## 2017-07-01 LAB — T-HELPER CELL (CD4) - (RCID CLINIC ONLY)
CD4 T CELL ABS: 490 /uL (ref 400–2700)
CD4 T CELL HELPER: 24 % — AB (ref 33–55)

## 2017-07-02 LAB — HIV-1 RNA QUANT-NO REFLEX-BLD
HIV 1 RNA Quant: <20 copies/mL
HIV-1 RNA QUANT, LOG: NOT DETECTED {Log_copies}/mL

## 2017-07-14 ENCOUNTER — Ambulatory Visit (INDEPENDENT_AMBULATORY_CARE_PROVIDER_SITE_OTHER): Payer: Self-pay | Admitting: Internal Medicine

## 2017-07-14 ENCOUNTER — Other Ambulatory Visit: Payer: Self-pay

## 2017-07-14 ENCOUNTER — Encounter: Payer: Self-pay | Admitting: Internal Medicine

## 2017-07-14 VITALS — BP 118/69 | HR 79 | Temp 98.0°F | Ht 62.0 in | Wt 135.0 lb

## 2017-07-14 DIAGNOSIS — B20 Human immunodeficiency virus [HIV] disease: Secondary | ICD-10-CM

## 2017-07-14 NOTE — Patient Instructions (Signed)
Come in to do labs 1-2 wk before next appt

## 2017-07-14 NOTE — Progress Notes (Signed)
 RFV : follow up for hiv disease  Patient ID: Veronica Robles, female   DOB: 02/23/1956, 61 y.o.   MRN: [id number]  HPI Veronica Robles is a 61yo F with hiv disease, CD 4 count of tivicay,zidovudine, cATV. She reports that she is doing well with meds. CD 4 count of 490/VL<20 (Dec 2018). Doing well with taking her meds. No recent illnesses.  Outpatient Encounter Medications as of 07/14/2017  Medication Sig  . atazanavir-cobicistat (EVOTAZ) 300-150 MG tablet Take 1 tablet by mouth daily. Swallow whole. Do NOT crush, cut or chew tablet. Take with food.  . Blood Pressure Monitoring (ADULT BLOOD PRESSURE CUFF LG) KIT    . dolutegravir (TIVICAY) 50 MG tablet TAKE 1 TABLET(50 MG) BY MOUTH TWICE DAILY  . lisinopril (PRINIVIL,ZESTRIL) 10 MG tablet TAKE 1 TABLET BY MOUTH EVERY DAY  . zidovudine (RETROVIR) 300 MG tablet Take 1 tablet (300 mg total) by mouth 2 (two) times daily.  . amLODipine (NORVASC) 10 MG tablet TK 1/2 T PO D FOR 4 DAYS THEN 1 T D   Facility-Administered Encounter Medications as of 07/14/2017  Medication  . ciprofloxacin (CIPRO) IVPB 400 mg     Patient Active Problem List   Diagnosis Date Noted  . 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 HYPERTENSION, BENIGN 01/26/2007  . 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 AIDS 10/26/2006  . HX, PERSONAL, PENICILLIN ALLERGY 10/26/2006  . HX, PERSONAL, DRUG ALLERGY NOS 10/26/2006     Health Maintenance Due  Topic Date Due  . COLONOSCOPY  10/19/2005  . PAP SMEAR  01/23/2015     Review of Systems  Constitutional: Negative for fever, chills, diaphoresis, activity change, appetite change, fatigue and unexpected weight change.  HENT: Negative for congestion, sore throat, rhinorrhea, sneezing, trouble swallowing and sinus pressure.  Eyes: Negative for photophobia and visual disturbance.  Respiratory: Negative for cough, chest tightness, shortness of breath, wheezing and stridor.  Cardiovascular: Negative for  chest pain, palpitations and leg swelling.  Gastrointestinal: Negative for nausea, vomiting, abdominal pain, diarrhea, constipation, blood in stool, abdominal distention and anal bleeding.  Genitourinary: Negative for dysuria, hematuria, flank pain and difficulty urinating.  Musculoskeletal: Negative for myalgias, back pain, joint swelling, arthralgias and gait problem.  Skin: Negative for color change, pallor, rash and wound.  Neurological: Negative for dizziness, tremors, weakness and light-headedness.  Hematological: Negative for adenopathy. Does not bruise/bleed easily.  Psychiatric/Behavioral: Negative for behavioral problems, confusion, sleep disturbance, dysphoric mood, decreased concentration and agitation.    Physical Exam   BP 118/69   Pulse 79   Temp 98 F (36.7 C) (Oral)   Ht 5' 2" (1.575 m)   Wt 135 lb (61.2 kg)   BMI 24.69 kg/m   Physical Exam  Constitutional:  oriented to person, place, and time. appears well-developed and well-nourished. No distress.  HENT: [place]/AT, PERRLA, no scleral icterus Mouth/Throat: Oropharynx is clear and moist. No oropharyngeal exudate.   Neck = supple, no nuchal rigidity Lymphadenopathy: no cervical adenopathy. No axillary adenopathy  Skin: Skin is warm and dry. No rash noted. No erythema.  Psychiatric: a normal mood and affect.  behavior is normal.   Lab Results  Component Value Date   CD4TCELL 24 (L) 06/30/2017   Lab Results  Component Value Date   CD4TABS 490 06/30/2017   CD4TABS 460 03/10/2017   CD4TABS 290 (L) 12/02/2016   Lab Results  Component Value Date   HIV1RNAQUANT <20 NOT DETECTED 06/30/2017   Lab Results  Component Value Date     HEPBSAB INDETER (A) 08/08/2009   Lab Results  Component Value Date   LABRPR NON REAC 10/30/2015    CBC Lab Results  Component Value Date   WBC 6.1 06/30/2017   RBC 3.98 06/30/2017   HGB 11.9 06/30/2017   HCT 35.8 06/30/2017   PLT 218 06/30/2017   MCV 89.9 06/30/2017   MCH 29.9  06/30/2017   MCHC 33.2 06/30/2017   RDW 14.7 06/30/2017   LYMPHSABS 2,013 06/30/2017   MONOABS 540 12/02/2016   EOSABS 92 06/30/2017    BMET Lab Results  Component Value Date   NA 140 06/30/2017   K 4.2 06/30/2017   CL 105 06/30/2017   CO2 29 06/30/2017   GLUCOSE 108 (H) 06/30/2017   BUN 11 06/30/2017   CREATININE 1.06 (H) 06/30/2017   CALCIUM 9.3 06/30/2017   GFRNONAA 57 (L) 06/30/2017   GFRAA 66 06/30/2017      Assessment and Plan hiv disease = Continue with current regimen. Excellent control  htn = well controlled as well. No change in regimen  Will see back in 3 months    

## 2017-08-03 ENCOUNTER — Encounter (HOSPITAL_COMMUNITY): Payer: Self-pay

## 2017-08-04 ENCOUNTER — Ambulatory Visit: Payer: Self-pay

## 2017-09-05 ENCOUNTER — Other Ambulatory Visit: Payer: Self-pay | Admitting: Internal Medicine

## 2017-09-08 ENCOUNTER — Encounter: Payer: Self-pay | Admitting: Internal Medicine

## 2017-09-29 ENCOUNTER — Other Ambulatory Visit: Payer: Self-pay

## 2017-09-29 DIAGNOSIS — B20 Human immunodeficiency virus [HIV] disease: Secondary | ICD-10-CM

## 2017-09-30 LAB — CBC WITH DIFFERENTIAL/PLATELET
BASOS PCT: 0.4 %
Basophils Absolute: 21 cells/uL (ref 0–200)
EOS PCT: 1.3 %
Eosinophils Absolute: 68 cells/uL (ref 15–500)
HCT: 34.5 % — ABNORMAL LOW (ref 35.0–45.0)
Hemoglobin: 11.7 g/dL (ref 11.7–15.5)
LYMPHS ABS: 1888 {cells}/uL (ref 850–3900)
MCH: 30.7 pg (ref 27.0–33.0)
MCHC: 33.9 g/dL (ref 32.0–36.0)
MCV: 90.6 fL (ref 80.0–100.0)
MPV: 10.1 fL (ref 7.5–12.5)
Monocytes Relative: 9.2 %
Neutro Abs: 2746 cells/uL (ref 1500–7800)
Neutrophils Relative %: 52.8 %
PLATELETS: 177 10*3/uL (ref 140–400)
RBC: 3.81 10*6/uL (ref 3.80–5.10)
RDW: 14.1 % (ref 11.0–15.0)
TOTAL LYMPHOCYTE: 36.3 %
WBC: 5.2 10*3/uL (ref 3.8–10.8)
WBCMIX: 478 {cells}/uL (ref 200–950)

## 2017-09-30 LAB — BASIC METABOLIC PANEL
BUN / CREAT RATIO: 12 (calc) (ref 6–22)
BUN: 13 mg/dL (ref 7–25)
CHLORIDE: 106 mmol/L (ref 98–110)
CO2: 28 mmol/L (ref 20–32)
CREATININE: 1.07 mg/dL — AB (ref 0.50–0.99)
Calcium: 9.1 mg/dL (ref 8.6–10.4)
Glucose, Bld: 104 mg/dL — ABNORMAL HIGH (ref 65–99)
POTASSIUM: 4.1 mmol/L (ref 3.5–5.3)
SODIUM: 142 mmol/L (ref 135–146)

## 2017-10-01 LAB — HIV-1 RNA QUANT-NO REFLEX-BLD
HIV 1 RNA Quant: <20 copies/mL
HIV-1 RNA Quant, Log: <1.3 Log copies/mL

## 2017-10-01 LAB — T-HELPER CELL (CD4) - (RCID CLINIC ONLY)
CD4 % Helper T Cell: 23 % — ABNORMAL LOW (ref 33–55)
CD4 T Cell Abs: 450 /uL (ref 400–2700)

## 2017-10-13 ENCOUNTER — Ambulatory Visit (INDEPENDENT_AMBULATORY_CARE_PROVIDER_SITE_OTHER): Payer: Self-pay | Admitting: Internal Medicine

## 2017-10-13 ENCOUNTER — Encounter: Payer: Self-pay | Admitting: Internal Medicine

## 2017-10-13 VITALS — BP 157/84 | HR 65 | Temp 97.9°F | Wt 136.0 lb

## 2017-10-13 DIAGNOSIS — B20 Human immunodeficiency virus [HIV] disease: Secondary | ICD-10-CM

## 2017-10-13 DIAGNOSIS — Z23 Encounter for immunization: Secondary | ICD-10-CM

## 2017-10-13 MED ORDER — LISINOPRIL 20 MG PO TABS: 20.0000 mg | ORAL_TABLET | Freq: Every day | ORAL | 11 refills | Status: DC

## 2017-10-13 NOTE — Patient Instructions (Signed)
Labs 2 wk before next visit

## 2017-10-13 NOTE — Progress Notes (Signed)
RFV: follow up for hiv disease  Patient ID: Veronica Robles, female   DOB: January 30, 1956, 62 y.o.   MRN: [id number]  HPI Veronica Robles is a 62yo F with well controlled HIV disease, CD 4 count of 450/VL<20 in March. Has good adherence presently to DTG-ZDV-ATV/c. She is only taking lisinopril 68m daily. She reports that she is doing well overall, no recent illness. She has missed 2 doses since last visit.  Outpatient Encounter Medications as of 10/13/2017  Medication Sig  . amLODipine (NORVASC) 10 MG tablet Take 1 tablet (10 mg total) by mouth daily.  .Marland Kitchenatazanavir-cobicistat (EVOTAZ) 300-150 MG tablet Take 1 tablet by mouth daily. Swallow whole. Do NOT crush, cut or chew tablet. Take with food.  . Blood Pressure Monitoring (ADULT BLOOD PRESSURE CUFF LG) KIT    . dolutegravir (TIVICAY) 50 MG tablet TAKE 1 TABLET(50 MG) BY MOUTH TWICE DAILY  . lisinopril (PRINIVIL,ZESTRIL) 10 MG tablet TAKE 1 TABLET BY MOUTH EVERY DAY  . zidovudine (RETROVIR) 300 MG tablet Take 1 tablet (300 mg total) by mouth 2 (two) times daily.   Facility-Administered Encounter Medications as of 10/13/2017  Medication  . ciprofloxacin (CIPRO) IVPB 400 mg     Patient Active Problem List   Diagnosis Date Noted  . 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 HYPERTENSION, BENIGN 01/26/2007  . 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 AIDS 10/26/2006  . HX, PERSONAL, PENICILLIN ALLERGY 10/26/2006  . HX, PERSONAL, DRUG ALLERGY NOS 10/26/2006     Health Maintenance Due  Topic Date Due  . COLONOSCOPY  10/19/2005  . PAP SMEAR  01/23/2015     Social History   Tobacco Use  . Smoking status: Former Smoker    Packs/day: 0.10    Types: Cigarettes  . Smokeless tobacco: Never Used  Substance Use Topics  . Alcohol use: No    Alcohol/week: 0.0 oz  . Drug use: No   Review of Systems  Constitutional: Negative for fever, chills, diaphoresis, activity change, appetite change, fatigue and unexpected weight  change.  HENT: Negative for congestion, sore throat, rhinorrhea, sneezing, trouble swallowing and sinus pressure.  Eyes: Negative for photophobia and visual disturbance.  Respiratory: Negative for cough, chest tightness, shortness of breath, wheezing and stridor.  Cardiovascular: Negative for chest pain, palpitations and leg swelling.  Gastrointestinal: Negative for nausea, vomiting, abdominal pain, diarrhea, constipation, blood in stool, abdominal distention and anal bleeding.  Genitourinary: Negative for dysuria, hematuria, flank pain and difficulty urinating.  Musculoskeletal: Negative for myalgias, back pain, joint swelling, arthralgias and gait problem.  Skin: Negative for color change, pallor, rash and wound.  Neurological: Negative for dizziness, tremors, weakness and light-headedness.  Hematological: Negative for adenopathy. Does not bruise/bleed easily.  Psychiatric/Behavioral: Negative for behavioral problems, confusion, sleep disturbance, dysphoric mood, decreased concentration and agitation.    Physical Exam   BP (!) 157/84   Pulse 65   Temp 97.9 F (36.6 C) (Oral)   Wt 136 lb (61.7 kg)   BMI 24.87 kg/m  Physical Exam  Constitutional:  oriented to person, place, and time. appears well-developed and well-nourished. No distress.  HENT: [place]/AT, PERRLA, no scleral icterus Mouth/Throat: Oropharynx is clear and moist. No oropharyngeal exudate.  Cardiovascular: Normal rate, regular rhythm and normal heart sounds. Exam reveals no gallop and no friction rub.  No murmur heard.  Pulmonary/Chest: Effort normal and breath sounds normal. No respiratory distress.  has no wheezes.  Neck = supple, no nuchal rigidity Abdominal: Soft. Bowel sounds are normal.  exhibits no distension. There is no tenderness.  Lymphadenopathy: no cervical adenopathy. No axillary adenopathy Neurological: alert and oriented to person, place, and time.  Skin: Skin is warm and dry. No rash noted. No erythema.    Psychiatric: a normal mood and affect.  behavior is normal.   Lab Results  Component Value Date   CD4TCELL 23 (L) 09/29/2017   Lab Results  Component Value Date   CD4TABS 450 09/29/2017   CD4TABS 490 06/30/2017   CD4TABS 460 03/10/2017   Lab Results  Component Value Date   HIV1RNAQUANT <20 NOT DETECTED 09/29/2017   Lab Results  Component Value Date   HEPBSAB INDETER (A) 08/08/2009   Lab Results  Component Value Date   LABRPR NON REAC 10/30/2015    CBC Lab Results  Component Value Date   WBC 5.2 09/29/2017   RBC 3.81 09/29/2017   HGB 11.7 09/29/2017   HCT 34.5 (L) 09/29/2017   PLT 177 09/29/2017   MCV 90.6 09/29/2017   MCH 30.7 09/29/2017   MCHC 33.9 09/29/2017   RDW 14.1 09/29/2017   LYMPHSABS 1,888 09/29/2017   MONOABS 540 12/02/2016   EOSABS 68 09/29/2017    BMET Lab Results  Component Value Date   NA 142 09/29/2017   K 4.1 09/29/2017   CL 106 09/29/2017   CO2 28 09/29/2017   GLUCOSE 104 (H) 09/29/2017   BUN 13 09/29/2017   CREATININE 1.07 (H) 09/29/2017   CALCIUM 9.1 09/29/2017   GFRNONAA 57 (L) 06/30/2017   GFRAA 66 06/30/2017      Assessment and Plan  Health maintenance = needs pneumovaccine 23 today  htn = will increase lisinopril 31m to 24mdaily (can stop taking amlodipine--no longer on it)  hiv disease = continue on current regimen

## 2017-10-26 ENCOUNTER — Other Ambulatory Visit: Payer: Self-pay | Admitting: Internal Medicine

## 2017-10-27 ENCOUNTER — Other Ambulatory Visit: Payer: Self-pay | Admitting: Internal Medicine

## 2017-10-27 DIAGNOSIS — Z1231 Encounter for screening mammogram for malignant neoplasm of breast: Secondary | ICD-10-CM

## 2017-11-03 IMAGING — RF DG C-ARM 61-120 MIN-NO REPORT
1 series · 2 of 2 positions shown · non-contrast
Comparison: none

[Series 1: run · 2 of 2 slices shown]
[im 1/2]
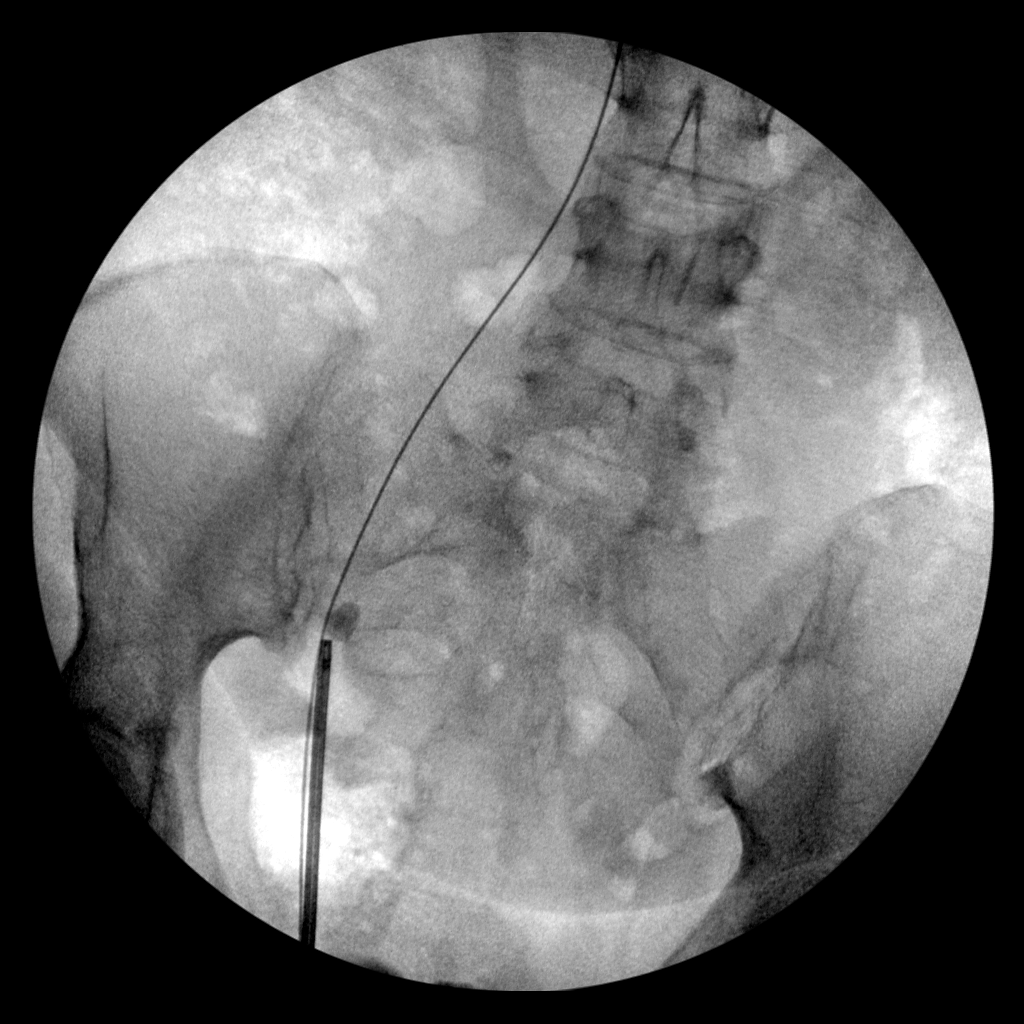
[im 2/2]
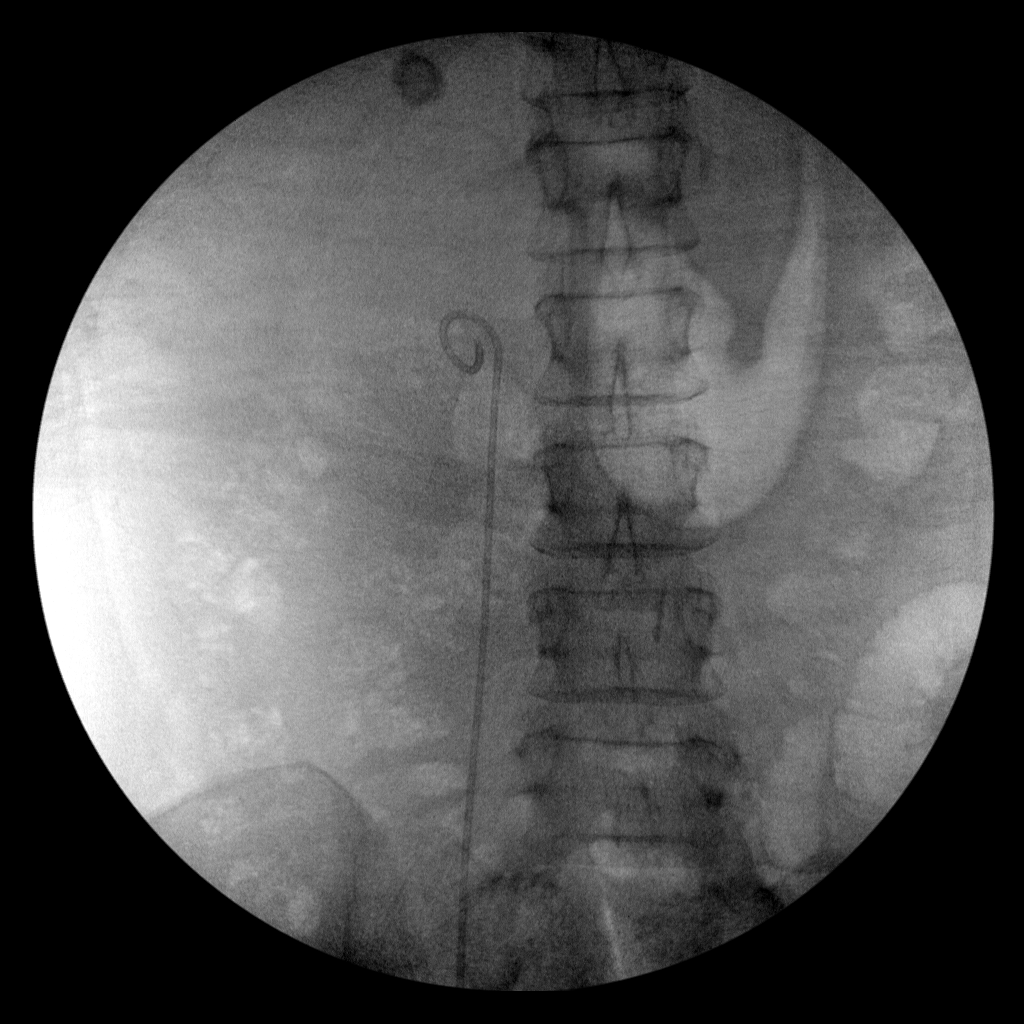

[2 of 2 positions shown; findings below may reference images not displayed]

:
Fluoroscopy was utilized by the requesting physician. No
radiographic interpretation.

## 2017-12-06 ENCOUNTER — Other Ambulatory Visit: Payer: Self-pay | Admitting: Internal Medicine

## 2017-12-06 DIAGNOSIS — B20 Human immunodeficiency virus [HIV] disease: Secondary | ICD-10-CM

## 2017-12-07 ENCOUNTER — Other Ambulatory Visit: Payer: Self-pay | Admitting: *Deleted

## 2017-12-07 DIAGNOSIS — B20 Human immunodeficiency virus [HIV] disease: Secondary | ICD-10-CM

## 2017-12-07 MED ORDER — ZIDOVUDINE 300 MG PO TABS
300.0000 mg | ORAL_TABLET | Freq: Two times a day (BID) | ORAL | 6 refills | Status: DC
Start: 2017-12-07 — End: 2018-05-10

## 2017-12-07 MED ORDER — ATAZANAVIR-COBICISTAT 300-150 MG PO TABS: 1.0000 | ORAL_TABLET | Freq: Every day | ORAL | 6 refills | Status: DC

## 2017-12-07 MED ORDER — DOLUTEGRAVIR SODIUM 50 MG PO TABS: ORAL_TABLET | ORAL | 6 refills | Status: DC

## 2017-12-09 ENCOUNTER — Ambulatory Visit
Admission: RE | Admit: 2017-12-09 | Discharge: 2017-12-09 | Disposition: A | Discharge status: Home / Self Care | Payer: No Typology Code available for payment source | Source: Ambulatory Visit | Attending: Internal Medicine | Admitting: Internal Medicine

## 2017-12-09 DIAGNOSIS — Z1231 Encounter for screening mammogram for malignant neoplasm of breast: Secondary | ICD-10-CM

## 2017-12-22 ENCOUNTER — Other Ambulatory Visit: Payer: Self-pay

## 2017-12-22 DIAGNOSIS — B20 Human immunodeficiency virus [HIV] disease: Secondary | ICD-10-CM

## 2017-12-23 LAB — BASIC METABOLIC PANEL
BUN / CREAT RATIO: 10 (calc) (ref 6–22)
BUN: 11 mg/dL (ref 7–25)
CO2: 30 mmol/L (ref 20–32)
Calcium: 9.1 mg/dL (ref 8.6–10.4)
Chloride: 106 mmol/L (ref 98–110)
Creat: 1.09 mg/dL — ABNORMAL HIGH (ref 0.50–0.99)
Glucose, Bld: 106 mg/dL — ABNORMAL HIGH (ref 65–99)
POTASSIUM: 4.1 mmol/L (ref 3.5–5.3)
SODIUM: 142 mmol/L (ref 135–146)

## 2017-12-23 LAB — CBC WITH DIFFERENTIAL/PLATELET
BASOS ABS: 32 {cells}/uL (ref 0–200)
Basophils Relative: 0.6 %
EOS ABS: 70 {cells}/uL (ref 15–500)
Eosinophils Relative: 1.3 %
HEMATOCRIT: 38.4 % (ref 35.0–45.0)
HEMOGLOBIN: 12.8 g/dL (ref 11.7–15.5)
LYMPHS ABS: 1793 {cells}/uL (ref 850–3900)
MCH: 30.1 pg (ref 27.0–33.0)
MCHC: 33.3 g/dL (ref 32.0–36.0)
MCV: 90.4 fL (ref 80.0–100.0)
MONOS PCT: 7.2 %
MPV: 10.4 fL (ref 7.5–12.5)
Neutro Abs: 3116 cells/uL (ref 1500–7800)
Neutrophils Relative %: 57.7 %
Platelets: 185 10*3/uL (ref 140–400)
RBC: 4.25 10*6/uL (ref 3.80–5.10)
RDW: 14.1 % (ref 11.0–15.0)
Total Lymphocyte: 33.2 %
WBC: 5.4 10*3/uL (ref 3.8–10.8)
WBCMIX: 389 {cells}/uL (ref 200–950)

## 2017-12-23 LAB — T-HELPER CELL (CD4) - (RCID CLINIC ONLY)
CD4 T CELL HELPER: 25 % — AB (ref 33–55)
CD4 T Cell Abs: 470 /uL (ref 400–2700)

## 2017-12-24 LAB — HIV-1 RNA QUANT-NO REFLEX-BLD
HIV 1 RNA Quant: <20 copies/mL
HIV-1 RNA Quant, Log: <1.3 Log copies/mL

## 2018-01-07 ENCOUNTER — Ambulatory Visit: Payer: No Typology Code available for payment source | Admitting: Internal Medicine

## 2018-02-03 ENCOUNTER — Encounter: Payer: Self-pay | Admitting: Internal Medicine

## 2018-02-03 ENCOUNTER — Ambulatory Visit (INDEPENDENT_AMBULATORY_CARE_PROVIDER_SITE_OTHER): Payer: Self-pay | Admitting: Internal Medicine

## 2018-02-03 ENCOUNTER — Ambulatory Visit: Payer: Self-pay

## 2018-02-03 VITALS — BP 165/80 | HR 71 | Temp 98.3°F | Wt 132.0 lb

## 2018-02-03 DIAGNOSIS — B20 Human immunodeficiency virus [HIV] disease: Secondary | ICD-10-CM

## 2018-02-03 DIAGNOSIS — K5900 Constipation, unspecified: Secondary | ICD-10-CM

## 2018-02-03 DIAGNOSIS — Z79899 Other long term (current) drug therapy: Secondary | ICD-10-CM

## 2018-02-03 DIAGNOSIS — I10 Essential (primary) hypertension: Secondary | ICD-10-CM

## 2018-02-03 NOTE — Progress Notes (Signed)
RFV: follow up for hiv disease  Patient ID: Veronica Robles, female   DOB: [birth year], 62 y.o.   MRN: [id number]  HPI Veronica Robles is  62yo F with hiv on tivicay, evotaz, and zidovudine, 470/VL<20 , geno with T69N,K101E,V118I,Y181C,M184I/V,G190A. She reports taking her medications as instructed. She is not missing doses. She reports that she does check her bp prior to being in clinic and usually not as high as marked during visit. She is otherwise in good health.  Outpatient Encounter Medications as of 02/03/2018  Medication Sig  . atazanavir-cobicistat (EVOTAZ) 300-150 MG tablet Take 1 tablet by mouth daily. Swallow whole. Do NOT crush, cut or chew tablet. Take with food.  . dolutegravir (TIVICAY) 50 MG tablet TAKE 1 TABLET(50 MG) BY MOUTH TWICE DAILY  . lisinopril (PRINIVIL,ZESTRIL) 20 MG tablet Take 1 tablet (20 mg total) by mouth daily.  . Blood Pressure Monitoring (ADULT BLOOD PRESSURE CUFF LG) KIT    . zidovudine (RETROVIR) 300 MG tablet Take 1 tablet (300 mg total) by mouth 2 (two) times daily.   Facility-Administered Encounter Medications as of 02/03/2018  Medication  . ciprofloxacin (CIPRO) IVPB 400 mg     Patient Active Problem List   Diagnosis Date Noted  . 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 HYPERTENSION, BENIGN 01/26/2007  . 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 AIDS 10/26/2006  . HX, PERSONAL, PENICILLIN ALLERGY 10/26/2006  . HX, PERSONAL, DRUG ALLERGY NOS 10/26/2006     Health Maintenance Due  Topic Date Due  . COLONOSCOPY  10/19/2005  . PAP SMEAR  01/23/2015    Social History   Tobacco Use  . Smoking status: Former Smoker    Packs/day: 0.10    Types: Cigarettes  . Smokeless tobacco: Never Used  Substance Use Topics  . Alcohol use: No    Alcohol/week: 0.0 oz  . Drug use: No   Review of Systems Review of Systems  Constitutional: Negative for fever, chills, diaphoresis, activity change, appetite change, fatigue and unexpected  weight change.  HENT: Negative for congestion, sore throat, rhinorrhea, sneezing, trouble swallowing and sinus pressure.  Eyes: Negative for photophobia and visual disturbance.  Respiratory: Negative for cough, chest tightness, shortness of breath, wheezing and stridor.  Cardiovascular: Negative for chest pain, palpitations and leg swelling.  Gastrointestinal: + constipation. No blood in stool.  Negative for nausea, vomiting, abdominal pain, diarrhea, blood in stool, abdominal distention and anal bleeding.  Genitourinary: Negative for dysuria, hematuria, flank pain and difficulty urinating.  Musculoskeletal: Negative for myalgias, back pain, joint swelling, arthralgias and gait problem.  Skin: Negative for color change, pallor, rash and wound.  Neurological: Negative for dizziness, tremors, weakness and light-headedness.  Hematological: Negative for adenopathy. Does not bruise/bleed easily.  Psychiatric/Behavioral: Negative for behavioral problems, confusion, sleep disturbance, dysphoric mood, decreased concentration and agitation.    Physical Exam  BP (!) 165/80   Pulse 71   Temp 98.3 F (36.8 C) (Oral)   Wt 132 lb (59.9 kg)   BMI 24.14 kg/m  Physical Exam  Constitutional:  oriented to person, place, and time. appears well-developed and well-nourished. No distress.  HENT: [place]/AT, PERRLA, no scleral icterus Mouth/Throat: Oropharynx is clear and moist. No oropharyngeal exudate.  Cardiovascular: Normal rate, regular rhythm and normal heart sounds. Exam reveals no gallop and no friction rub.  No murmur heard.  Pulmonary/Chest: Effort normal and breath sounds normal. No respiratory distress.  has no wheezes.  Neck = supple, no nuchal rigidity Lymphadenopathy: no cervical adenopathy. No axillary  adenopathy Neurological: alert and oriented to person, place, and time.  Skin: Skin is warm and dry. No rash noted. No erythema.  Psychiatric: a normal mood and affect.  behavior is normal.   Lab  Results  Component Value Date   CD4TCELL 25 (L) 12/22/2017   Lab Results  Component Value Date   CD4TABS 470 12/22/2017   CD4TABS 450 09/29/2017   CD4TABS 490 06/30/2017   Lab Results  Component Value Date   HIV1RNAQUANT <20 NOT DETECTED 12/22/2017   Lab Results  Component Value Date   HEPBSAB INDETER (A) 08/08/2009   Lab Results  Component Value Date   LABRPR NON REAC 10/30/2015    CBC Lab Results  Component Value Date   WBC 5.4 12/22/2017   RBC 4.25 12/22/2017   HGB 12.8 12/22/2017   HCT 38.4 12/22/2017   PLT 185 12/22/2017   MCV 90.4 12/22/2017   MCH 30.1 12/22/2017   MCHC 33.3 12/22/2017   RDW 14.1 12/22/2017   LYMPHSABS 1,793 12/22/2017   MONOABS 540 12/02/2016   EOSABS 70 12/22/2017    BMET Lab Results  Component Value Date   NA 142 12/22/2017   K 4.1 12/22/2017   CL 106 12/22/2017   CO2 30 12/22/2017   GLUCOSE 106 (H) 12/22/2017   BUN 11 12/22/2017   CREATININE 1.09 (H) 12/22/2017   CALCIUM 9.1 12/22/2017   GFRNONAA 57 (L) 06/30/2017   GFRAA 66 06/30/2017      Assessment and Plan 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 Continue on current regimen remains under good virologic control.  Health maintenance = prevnar 13 due march 2020  Hypertension = it is above baseline. I suspect she does suffer from white -coat syndrome. Will ask her to measure prior to coming to clinic to see what it is running at home.  ckd 2/chronic medication = appears kidney function is stable. No need to change her regimen  constipatoin - recommend to try miralax or  smooth move to help her with her symptoms

## 2018-02-23 ENCOUNTER — Encounter: Payer: Self-pay | Admitting: Internal Medicine

## 2018-05-10 ENCOUNTER — Encounter: Payer: Self-pay | Admitting: Internal Medicine

## 2018-05-10 ENCOUNTER — Ambulatory Visit (INDEPENDENT_AMBULATORY_CARE_PROVIDER_SITE_OTHER): Payer: Self-pay | Admitting: Internal Medicine

## 2018-05-10 VITALS — BP 154/85 | HR 66 | Temp 98.4°F | Ht 62.0 in | Wt 134.0 lb

## 2018-05-10 DIAGNOSIS — Z2809 Immunization not carried out because of other contraindication: Secondary | ICD-10-CM

## 2018-05-10 DIAGNOSIS — Z79899 Other long term (current) drug therapy: Secondary | ICD-10-CM

## 2018-05-10 DIAGNOSIS — Z23 Encounter for immunization: Secondary | ICD-10-CM

## 2018-05-10 DIAGNOSIS — B20 Human immunodeficiency virus [HIV] disease: Secondary | ICD-10-CM

## 2018-05-10 MED ORDER — ATAZANAVIR-COBICISTAT 300-150 MG PO TABS: 1.0000 | ORAL_TABLET | Freq: Every day | ORAL | 6 refills | Status: DC

## 2018-05-10 MED ORDER — DOLUTEGRAVIR SODIUM 50 MG PO TABS: ORAL_TABLET | ORAL | 6 refills | Status: DC

## 2018-05-10 MED ORDER — ZIDOVUDINE 300 MG PO TABS: 300.0000 mg | ORAL_TABLET | Freq: Two times a day (BID) | ORAL | 6 refills | Status: DC

## 2018-05-10 NOTE — Progress Notes (Signed)
RFV: follow up for hiv disease  Patient ID: Veronica Robles, female   DOB: 07-31-1955, 62 y.o.   MRN: [id number]  HPI Veronica Robles is a 62yo F with hiv disease, CD 4 count 470/VL<20 (may 2019) on tivicay-evotaz-zidovudine. She reports missing a few doses since we last saw her in may. Doing well overall. No recent health complaints  Outpatient Encounter Medications as of 05/10/2018  Medication Sig  . atazanavir-cobicistat (EVOTAZ) 300-150 MG tablet Take 1 tablet by mouth daily. Swallow whole. Do NOT crush, cut or chew tablet. Take with food.  . Blood Pressure Monitoring (ADULT BLOOD PRESSURE CUFF LG) KIT    . dolutegravir (TIVICAY) 50 MG tablet TAKE 1 TABLET(50 MG) BY MOUTH TWICE DAILY  . lisinopril (PRINIVIL,ZESTRIL) 20 MG tablet Take 1 tablet (20 mg total) by mouth daily.  . zidovudine (RETROVIR) 300 MG tablet Take 1 tablet (300 mg total) by mouth 2 (two) times daily.   Facility-Administered Encounter Medications as of 05/10/2018  Medication  . ciprofloxacin (CIPRO) IVPB 400 mg     Patient Active Problem List   Diagnosis Date Noted  . 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 HYPERTENSION, BENIGN 01/26/2007  . 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 AIDS 10/26/2006  . HX, PERSONAL, PENICILLIN ALLERGY 10/26/2006  . HX, PERSONAL, DRUG ALLERGY NOS 10/26/2006     Health Maintenance Due  Topic Date Due  . COLONOSCOPY  10/19/2005  . PAP SMEAR  01/23/2015  . INFLUENZA VACCINE  02/25/2018     Review of Systems Review of Systems  Constitutional: Negative for fever, chills, diaphoresis, activity change, appetite change, fatigue and unexpected weight change.  HENT: Negative for congestion, sore throat, rhinorrhea, sneezing, trouble swallowing and sinus pressure.  Eyes: Negative for photophobia and visual disturbance.  Respiratory: Negative for cough, chest tightness, shortness of breath, wheezing and stridor.  Cardiovascular: Negative for chest pain,  palpitations and leg swelling.  Gastrointestinal: Negative for nausea, vomiting, abdominal pain, diarrhea, constipation, blood in stool, abdominal distention and anal bleeding.  Genitourinary: Negative for dysuria, hematuria, flank pain and difficulty urinating.  Musculoskeletal: Negative for myalgias, back pain, joint swelling, arthralgias and gait problem.  Skin: Negative for color change, pallor, rash and wound.  Neurological: Negative for dizziness, tremors, weakness and light-headedness.  Hematological: Negative for adenopathy. Does not bruise/bleed easily.  Psychiatric/Behavioral: Negative for behavioral problems, confusion, sleep disturbance, dysphoric mood, decreased concentration and agitation.    Physical Exam   BP (!) 154/85   Pulse 66   Temp 98.4 F (36.9 C)   Ht _0  (1.575 m)   Wt 134 lb (60.8 kg)   BMI 24.51 kg/m   Physical Exam  Constitutional:  oriented to person, place, and time. appears well-developed and well-nourished. No distress.  HENT: [place]/AT, PERRLA, no scleral icterus Mouth/Throat: Oropharynx is clear and moist. No oropharyngeal exudate.  Cardiovascular: Normal rate, regular rhythm and normal heart sounds. Exam reveals no gallop and no friction rub.  No murmur heard.  Pulmonary/Chest: Effort normal and breath sounds normal. No respiratory distress.  has no wheezes.  Neck = supple, no nuchal rigidity Abdominal: Soft. Bowel sounds are normal.  exhibits no distension. There is no tenderness.  Lymphadenopathy: no cervical adenopathy. No axillary adenopathy Neurological: alert and oriented to person, place, and time.  Skin: Skin is warm and dry. No rash noted. No erythema.  Psychiatric: a normal mood and affect.  behavior is normal.   Lab Results  Component Value Date   CD4TCELL 25 (L) 12/22/2017  Lab Results  Component Value Date   CD4TABS 470 12/22/2017   CD4TABS 450 09/29/2017   CD4TABS 490 06/30/2017   Lab Results  Component Value Date    HIV1RNAQUANT <20 NOT DETECTED 12/22/2017   Lab Results  Component Value Date   HEPBSAB INDETER (A) 08/08/2009   Lab Results  Component Value Date   LABRPR NON REAC 10/30/2015    CBC Lab Results  Component Value Date   WBC 5.4 12/22/2017   RBC 4.25 12/22/2017   HGB 12.8 12/22/2017   HCT 38.4 12/22/2017   PLT 185 12/22/2017   MCV 90.4 12/22/2017   MCH 30.1 12/22/2017   MCHC 33.3 12/22/2017   RDW 14.1 12/22/2017   LYMPHSABS 1,793 12/22/2017   MONOABS 540 12/02/2016   EOSABS 70 12/22/2017    BMET Lab Results  Component Value Date   NA 142 12/22/2017   K 4.1 12/22/2017   CL 106 12/22/2017   CO2 30 12/22/2017   GLUCOSE 106 (H) 12/22/2017   BUN 11 12/22/2017   CREATININE 1.09 (H) 12/22/2017   CALCIUM 9.1 12/22/2017   GFRNONAA 57 (L) 06/30/2017   GFRAA 66 06/30/2017      Assessment and Plan  hiv disease= continue on current regimen and plan to check cd 4 count and viral load. Needs refills of her regimen  Health maintenance = will give prevnar and flu  Long term medication management = cr appears stable. Continue with curret regimen

## 2018-05-11 LAB — T-HELPER CELL (CD4) - (RCID CLINIC ONLY)
CD4 T CELL HELPER: 21 % — AB (ref 33–55)
CD4 T Cell Abs: 410 /uL (ref 400–2700)

## 2018-05-11 NOTE — Progress Notes (Signed)
Prevnar and Flu vaccines administered during office visit. Patient tolerated well. S.Nelia Rogoff, LPN

## 2018-05-12 LAB — RPR: RPR: NONREACTIVE

## 2018-05-12 LAB — COMPLETE METABOLIC PANEL WITH GFR
AG RATIO: 1.6 (calc) (ref 1.0–2.5)
ALT: 13 U/L (ref 6–29)
AST: 20 U/L (ref 10–35)
Albumin: 4.3 g/dL (ref 3.6–5.1)
Alkaline phosphatase (APISO): 86 U/L (ref 33–130)
BILIRUBIN TOTAL: 0.4 mg/dL (ref 0.2–1.2)
BUN: 13 mg/dL (ref 7–25)
CALCIUM: 9.4 mg/dL (ref 8.6–10.4)
CHLORIDE: 105 mmol/L (ref 98–110)
CO2: 27 mmol/L (ref 20–32)
Creat: 0.97 mg/dL (ref 0.50–0.99)
GFR, EST AFRICAN AMERICAN: 73 mL/min/{1.73_m2} (ref >=60)
GFR, EST NON AFRICAN AMERICAN: 63 mL/min/{1.73_m2} (ref >=60)
GLUCOSE: 76 mg/dL (ref 65–99)
Globulin: 2.7 g/dL (calc) (ref 1.9–3.7)
Potassium: 4.3 mmol/L (ref 3.5–5.3)
Sodium: 141 mmol/L (ref 135–146)
TOTAL PROTEIN: 7 g/dL (ref 6.1–8.1)

## 2018-05-12 LAB — CBC WITH DIFFERENTIAL/PLATELET
BASOS PCT: 0.3 %
Basophils Absolute: 18 cells/uL (ref 0–200)
EOS ABS: 100 {cells}/uL (ref 15–500)
Eosinophils Relative: 1.7 %
HCT: 38.8 % (ref 35.0–45.0)
HEMOGLOBIN: 12.8 g/dL (ref 11.7–15.5)
LYMPHS ABS: 1823 {cells}/uL (ref 850–3900)
MCH: 30.1 pg (ref 27.0–33.0)
MCHC: 33 g/dL (ref 32.0–36.0)
MCV: 91.3 fL (ref 80.0–100.0)
MPV: 10.2 fL (ref 7.5–12.5)
Monocytes Relative: 9.6 %
NEUTROS ABS: 3393 {cells}/uL (ref 1500–7800)
Neutrophils Relative %: 57.5 %
Platelets: 193 10*3/uL (ref 140–400)
RBC: 4.25 10*6/uL (ref 3.80–5.10)
RDW: 13.9 % (ref 11.0–15.0)
Total Lymphocyte: 30.9 %
WBC: 5.9 10*3/uL (ref 3.8–10.8)
WBCMIX: 566 {cells}/uL (ref 200–950)

## 2018-05-12 LAB — HIV-1 RNA QUANT-NO REFLEX-BLD
HIV 1 RNA Quant: <20 copies/mL
HIV-1 RNA QUANT, LOG: NOT DETECTED {Log_copies}/mL

## 2018-07-19 ENCOUNTER — Telehealth: Payer: Self-pay | Admitting: Behavioral Health

## 2018-07-19 NOTE — Telephone Encounter (Signed)
Patient states she has been sick for about 2 weeks.  She states she has had a dry cough that has turned into now a productive cough and to say sh has loose stools.  She called to see if Dr. Baxter Flattery had any other suggestions.  Advised her to increase fluid intake an take an expectorant.  She verbalized understanding and states she will wait to see what Dr. Baxter Flattery suggests. Pricilla Riffle RN

## 2018-07-20 ENCOUNTER — Emergency Department (HOSPITAL_COMMUNITY)
Admission: EM | Admit: 2018-07-20 | Discharge: 2018-07-20 | Disposition: A | Discharge status: Home / Self Care | Payer: Self-pay | Attending: Emergency Medicine | Admitting: Emergency Medicine

## 2018-07-20 ENCOUNTER — Encounter (HOSPITAL_COMMUNITY): Payer: Self-pay | Admitting: Emergency Medicine

## 2018-07-20 ENCOUNTER — Emergency Department (HOSPITAL_COMMUNITY): Payer: Self-pay

## 2018-07-20 DIAGNOSIS — J189 Pneumonia, unspecified organism: Secondary | ICD-10-CM | POA: Insufficient documentation

## 2018-07-20 DIAGNOSIS — I10 Essential (primary) hypertension: Secondary | ICD-10-CM | POA: Insufficient documentation

## 2018-07-20 DIAGNOSIS — Z87891 Personal history of nicotine dependence: Secondary | ICD-10-CM | POA: Insufficient documentation

## 2018-07-20 DIAGNOSIS — E876 Hypokalemia: Secondary | ICD-10-CM | POA: Insufficient documentation

## 2018-07-20 DIAGNOSIS — R197 Diarrhea, unspecified: Secondary | ICD-10-CM

## 2018-07-20 DIAGNOSIS — Z79899 Other long term (current) drug therapy: Secondary | ICD-10-CM | POA: Insufficient documentation

## 2018-07-20 DIAGNOSIS — B20 Human immunodeficiency virus [HIV] disease: Secondary | ICD-10-CM | POA: Insufficient documentation

## 2018-07-20 LAB — URINALYSIS, ROUTINE W REFLEX MICROSCOPIC
BACTERIA UA: NONE SEEN
Bilirubin Urine: NEGATIVE
Glucose, UA: NEGATIVE mg/dL
HGB URINE DIPSTICK: NEGATIVE
Ketones, ur: 5 mg/dL — AB
Leukocytes, UA: NEGATIVE
NITRITE: NEGATIVE
Protein, ur: 100 mg/dL — AB
Specific Gravity, Urine: 1.023 (ref 1.005–1.030)
pH: 5 (ref 5.0–8.0)

## 2018-07-20 LAB — COMPREHENSIVE METABOLIC PANEL
ALT: 24 U/L (ref 0–44)
AST: 36 U/L (ref 15–41)
Albumin: 3.9 g/dL (ref 3.5–5.0)
Alkaline Phosphatase: 80 U/L (ref 38–126)
Anion gap: 11 (ref 5–15)
BUN: 11 mg/dL (ref 8–23)
CHLORIDE: 102 mmol/L (ref 98–111)
CO2: 24 mmol/L (ref 22–32)
Calcium: 8.7 mg/dL — ABNORMAL LOW (ref 8.9–10.3)
Creatinine, Ser: 0.94 mg/dL (ref 0.44–1.00)
GFR calc Af Amer: >60 mL/min (ref >60)
GFR calc non Af Amer: >60 mL/min (ref >60)
Glucose, Bld: 102 mg/dL — ABNORMAL HIGH (ref 70–99)
Potassium: 3.2 mmol/L — ABNORMAL LOW (ref 3.5–5.1)
Sodium: 137 mmol/L (ref 135–145)
Total Bilirubin: 0.3 mg/dL (ref 0.3–1.2)
Total Protein: 7.8 g/dL (ref 6.5–8.1)

## 2018-07-20 LAB — CBC
HEMATOCRIT: 40.9 % (ref 36.0–46.0)
HEMOGLOBIN: 12.9 g/dL (ref 12.0–15.0)
MCH: 28.9 pg (ref 26.0–34.0)
MCHC: 31.5 g/dL (ref 30.0–36.0)
MCV: 91.5 fL (ref 80.0–100.0)
Platelets: 183 10*3/uL (ref 150–400)
RBC: 4.47 MIL/uL (ref 3.87–5.11)
RDW: 13.4 % (ref 11.5–15.5)
WBC: 6.8 10*3/uL (ref 4.0–10.5)
nRBC: 0 % (ref 0.0–0.2)

## 2018-07-20 LAB — LIPASE, BLOOD: LIPASE: 78 U/L — AB (ref 11–51)

## 2018-07-20 MED ORDER — POTASSIUM CHLORIDE CRYS ER 20 MEQ PO TBCR
40.0000 meq | EXTENDED_RELEASE_TABLET | Freq: Once | ORAL | Status: DC
  Filled 2018-07-20: qty 2

## 2018-07-20 MED ORDER — POTASSIUM CHLORIDE CRYS ER 20 MEQ PO TBCR
40.0000 meq | EXTENDED_RELEASE_TABLET | Freq: Once | ORAL | Status: AC
  Administered 2018-07-20: 40 meq via ORAL

## 2018-07-20 MED ORDER — AZITHROMYCIN 100 MG/5ML PO SUSR: 250.0000 mg | Freq: Every day | ORAL | 0 refills | Status: DC

## 2018-07-20 MED ORDER — AZITHROMYCIN 200 MG/5ML PO SUSR
500.0000 mg | Freq: Once | ORAL | Status: AC
  Administered 2018-07-20: 500 mg via ORAL
  Filled 2018-07-20: qty 12.5

## 2018-07-20 MED ORDER — AZITHROMYCIN 100 MG/5ML PO SUSR: 250.0000 mg | Freq: Every day | ORAL | 0 refills | Status: AC

## 2018-07-20 MED ORDER — AZITHROMYCIN 250 MG PO TABS
500.0000 mg | ORAL_TABLET | Freq: Once | ORAL | Status: DC
  Filled 2018-07-20: qty 2

## 2018-07-20 NOTE — ED Triage Notes (Signed)
Pt reports had cough for 2-3 weeks. 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  Pt reports past couple days had diarrhea.

## 2018-07-20 NOTE — ED Notes (Signed)
Pt and family members verbalized discharge instructions and follow up care. Ambulatory and alert.

## 2018-07-20 NOTE — Discharge Instructions (Signed)
You were evaluated in the emergency department for a cough along with a few days of some loose stool.  Your chest x-ray showed a possible pneumonia.  Your potassium was little low on the blood work.  We are starting you on some antibiotics and appears important for you to stay well-hydrated.  Please follow-up with your doctor and return if any worsening symptoms.

## 2018-07-20 NOTE — ED Provider Notes (Signed)
[facility] DEPT Provider Note   CSN: [id number] Arrival date & time: 07/20/18  1706     History   Chief Complaint Chief Complaint  Patient presents with  . Cough  . Diarrhea    HPI Veronica Robles is a 62 y.o. female.  She is complaining of being sick for 2 or 3 weeks.  Mostly been a dry cough although sometimes is been productive.  She had a little bit of sneezing.  It has not really resolved and for the last 2 days she has had some nausea vomiting and diarrhea.  She said she has had about 3 loose bowel movements a day.  No blood from above or below.  No known fever.  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  She is tried some green tea and ginger with minimal relief.  The history is provided by the patient.  Cough  This is a new problem. The current episode started more than 1 week ago. The problem occurs hourly. The problem has not changed since onset.The cough is non-productive. The maximum temperature recorded prior to her arrival was 100 to 100.9 F. Associated symptoms include rhinorrhea. Pertinent negatives include no chest pain, no chills, no sweats, no ear congestion, no ear pain, no headaches, no sore throat, no myalgias, no shortness of breath, no wheezing and no eye redness. She has tried nothing for the symptoms. The treatment provided no relief. She is not a smoker.  Diarrhea   Associated symptoms include cough. Pertinent negatives include no abdominal pain, no chills, no sweats, no headaches and no myalgias.    Past Medical History:  Diagnosis Date  . HIV infection ([place])   . Hypertension     Patient Active Problem List   Diagnosis Date Noted  . 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 HYPERTENSION, BENIGN 01/26/2007  . 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 AIDS 10/26/2006  . HX, PERSONAL, PENICILLIN ALLERGY 10/26/2006  . HX, PERSONAL, DRUG ALLERGY NOS 10/26/2006    Past Surgical History:    Procedure Laterality Date  . ABDOMINAL HYSTERECTOMY    . APPENDECTOMY    . BREAST BIOPSY Right 2015  . CYSTOSCOPY/RETROGRADE/URETEROSCOPY Right 11/14/2016   Procedure: CYSTOSCOPY/RETROGRADE/URETEROSCOPY/ STONE EXTRACTION/HOLMIUM LASER/STENT PLACEMENT;  Surgeon: Kathie Rhodes, MD;  Location: [facility] OR[facility];  Service: Urology;  Laterality: Right;     OB History    Gravida  1   Para  1   Term  1   Preterm      AB      Living  1     SAB      TAB      Ectopic      Multiple      Live Births               Home Medications    Prior to Admission medications   Medication Sig Start Date End Date Taking? Authorizing Provider  atazanavir-cobicistat (EVOTAZ) 300-150 MG tablet Take 1 tablet by mouth daily. Swallow whole. Do NOT crush, cut or chew tablet. Take with food. 05/10/18   Carlyle Basques, MD  Blood Pressure Monitoring (ADULT BLOOD PRESSURE CUFF LG) KIT      [provider]  dolutegravir (TIVICAY) 50 MG tablet TAKE 1 TABLET(50 MG) BY MOUTH TWICE DAILY 05/10/18   Carlyle Basques, MD  lisinopril (PRINIVIL,ZESTRIL) 20 MG tablet Take 1 tablet (20 mg total) by mouth daily. 10/13/17   Carlyle Basques, MD  zidovudine (RETROVIR) 300  MG tablet Take 1 tablet (300 mg total) by mouth 2 (two) times daily. 05/10/18   Carlyle Basques, MD    Family History Family History  Problem Relation Age of Onset  . Hypertension Mother   . Cancer Father        pancreatic  . Breast cancer Sister     Social History Social History   Tobacco Use  . Smoking status: Former Smoker    Packs/day: 0.10    Types: Cigarettes  . Smokeless tobacco: Never Used  Substance Use Topics  . Alcohol use: No    Alcohol/week: 0.0 standard drinks  . Drug use: No     Allergies   Penicillins   Review of Systems Review of Systems  Constitutional: Negative for chills and fever.  HENT: Positive for rhinorrhea. Negative for ear pain and sore throat.   Eyes: Negative for redness.  Respiratory:  Positive for cough. Negative for shortness of breath and wheezing.   Cardiovascular: Negative for chest pain.  Gastrointestinal: Positive for diarrhea. Negative for abdominal pain.  Genitourinary: Negative for dysuria.  Musculoskeletal: Negative for myalgias.  Skin: Negative for rash.  Neurological: Negative for headaches.     Physical Exam Updated Vital Signs BP (!) 152/92 (BP Location: Right Arm)   Pulse (!) 109   Temp 99.2 F (37.3 C) (Oral)   Resp 18   SpO2 96%   Physical Exam Vitals signs and nursing note reviewed.  Constitutional:      General: She is not in acute distress.    Appearance: She is well-developed.  HENT:     Head: Normocephalic and atraumatic.     Right Ear: Tympanic membrane normal.     Left Ear: Tympanic membrane normal.     Nose: Nose normal.     Mouth/Throat:     Mouth: Mucous membranes are moist.     Pharynx: Posterior oropharyngeal erythema present. No oropharyngeal exudate.  Eyes:     Conjunctiva/sclera: Conjunctivae normal.  Neck:     Musculoskeletal: Normal range of motion and neck supple. No muscular tenderness.  Cardiovascular:     Rate and Rhythm: Normal rate and regular rhythm.     Pulses: Normal pulses.     Heart sounds: No murmur.  Pulmonary:     Effort: Pulmonary effort is normal. No respiratory distress.     Breath sounds: Normal breath sounds.  Abdominal:     Palpations: Abdomen is soft.     Tenderness: There is no abdominal tenderness. There is no guarding.  Musculoskeletal: Normal range of motion.        General: No swelling or tenderness.     Right lower leg: No edema.     Left lower leg: No edema.  Lymphadenopathy:     Cervical: No cervical adenopathy.  Skin:    General: Skin is warm and dry.     Capillary Refill: Capillary refill takes less than 2 seconds.  Neurological:     General: No focal deficit present.     Mental Status: She is alert and oriented to person, place, and time.     Sensory: No sensory deficit.      Motor: No weakness.     Gait: Gait normal.      ED Treatments / Results  Labs (all labs ordered are listed, but only abnormal results are displayed) Labs Reviewed  LIPASE, BLOOD - Abnormal; Notable for the following components:      Result Value   Lipase 78 (*)  All other components within normal limits  COMPREHENSIVE METABOLIC PANEL - Abnormal; Notable for the following components:   Potassium 3.2 (*)    Glucose, Bld 102 (*)    Calcium 8.7 (*)    All other components within normal limits  URINALYSIS, ROUTINE W REFLEX MICROSCOPIC - Abnormal; Notable for the following components:   APPearance HAZY (*)    Ketones, ur 5 (*)    Protein, ur 100 (*)    All other components within normal limits  CBC    EKG None  Radiology Dg Chest 2 View  Result Date: 07/20/2018 CLINICAL DATA:  Cough for 3 weeks. EXAM: CHEST - 2 VIEW COMPARISON:  None. FINDINGS: The heart size and mediastinal contours are within normal limits. Aortic atherosclerosis. Patchy bilateral lower lobe airspace disease is seen, consistent with pneumonia. No evidence of pleural effusion. IMPRESSION: Mild bilateral lower lobe airspace disease, consistent with pneumonia. Electronically Signed   By: Earle Gell M.D.   On: 07/20/2018 18:08    Procedures Procedures (including critical care time)  Medications Ordered in ED Medications  azithromycin (ZITHROMAX) 200 MG/5ML suspension 500 mg (500 mg Oral Given 07/20/18 1855)  potassium chloride SA (K-DUR,KLOR-CON) CR tablet 40 mEq (40 mEq Oral Given 07/20/18 1842)     Initial Impression / Assessment and Plan / ED Course  I have reviewed the triage vital signs and the nursing notes.  Pertinent labs & imaging results that were available during my care of the patient were reviewed by me and considered in my medical decision making (see chart for details).  Clinical Course as of Jul 20 2257  Tue Jul 20, 2018  1836 Patient states her viral load is undetectable.  She is  very nontoxic-appearing here.  Her lab work is fairly unremarkable other than mildly low potassium and a slightly elevated lipase nonspecific.  I have given her some p.o. potassium here and an oral dose of Zithromax and she is comfortable going home continue with Zithromax.  She understands to return if any worsening symptoms.   [MB]    Clinical Course User Index [MB] Hayden Rasmussen, MD     Final Clinical Impressions(s) / ED Diagnoses   Final diagnoses:  Community acquired pneumonia, unspecified laterality  Diarrhea, unspecified type  Hypokalemia    ED Discharge Orders         Ordered    azithromycin (ZITHROMAX) 100 MG/5ML suspension  Daily,   Status:  Discontinued     07/20/18 1838    azithromycin (ZITHROMAX) 100 MG/5ML suspension  Daily     07/20/18 1838           Hayden Rasmussen, MD 07/20/18 2258

## 2018-07-22 ENCOUNTER — Other Ambulatory Visit: Payer: Self-pay | Admitting: Internal Medicine

## 2018-07-31 ENCOUNTER — Other Ambulatory Visit: Payer: Self-pay

## 2018-07-31 ENCOUNTER — Encounter (HOSPITAL_COMMUNITY): Payer: Self-pay | Admitting: *Deleted

## 2018-07-31 ENCOUNTER — Encounter (HOSPITAL_COMMUNITY): Payer: Self-pay | Admitting: Nurse Practitioner

## 2018-07-31 ENCOUNTER — Emergency Department (HOSPITAL_COMMUNITY): Payer: Self-pay

## 2018-07-31 ENCOUNTER — Emergency Department (HOSPITAL_COMMUNITY)
Admission: EM | Admit: 2018-07-31 | Discharge: 2018-08-01 | Disposition: A | Discharge status: Home / Self Care | Payer: No Typology Code available for payment source | Attending: Emergency Medicine | Admitting: Emergency Medicine

## 2018-07-31 ENCOUNTER — Emergency Department (HOSPITAL_COMMUNITY)
Admission: EM | Admit: 2018-07-31 | Discharge: 2018-07-31 | Disposition: A | Discharge status: Home / Self Care | Payer: Self-pay | Attending: Emergency Medicine | Admitting: Emergency Medicine

## 2018-07-31 DIAGNOSIS — B20 Human immunodeficiency virus [HIV] disease: Secondary | ICD-10-CM | POA: Insufficient documentation

## 2018-07-31 DIAGNOSIS — J189 Pneumonia, unspecified organism: Secondary | ICD-10-CM | POA: Insufficient documentation

## 2018-07-31 DIAGNOSIS — R002 Palpitations: Secondary | ICD-10-CM | POA: Insufficient documentation

## 2018-07-31 DIAGNOSIS — Z79899 Other long term (current) drug therapy: Secondary | ICD-10-CM | POA: Insufficient documentation

## 2018-07-31 DIAGNOSIS — Z87891 Personal history of nicotine dependence: Secondary | ICD-10-CM | POA: Insufficient documentation

## 2018-07-31 DIAGNOSIS — I10 Essential (primary) hypertension: Secondary | ICD-10-CM | POA: Insufficient documentation

## 2018-07-31 LAB — CBC WITH DIFFERENTIAL/PLATELET
Abs Immature Granulocytes: 0.02 10*3/uL (ref 0.00–0.07)
BASOS ABS: 0 10*3/uL (ref 0.0–0.1)
Basophils Relative: 0 %
Eosinophils Absolute: 0.1 10*3/uL (ref 0.0–0.5)
Eosinophils Relative: 1 %
HCT: 42 % (ref 36.0–46.0)
Hemoglobin: 13.1 g/dL (ref 12.0–15.0)
Immature Granulocytes: 0 %
LYMPHS PCT: 35 %
Lymphs Abs: 2.1 10*3/uL (ref 0.7–4.0)
MCH: 29 pg (ref 26.0–34.0)
MCHC: 31.2 g/dL (ref 30.0–36.0)
MCV: 92.9 fL (ref 80.0–100.0)
MONOS PCT: 9 %
Monocytes Absolute: 0.5 10*3/uL (ref 0.1–1.0)
NEUTROS ABS: 3.3 10*3/uL (ref 1.7–7.7)
Neutrophils Relative %: 55 %
Platelets: 254 10*3/uL (ref 150–400)
RBC: 4.52 MIL/uL (ref 3.87–5.11)
RDW: 13.9 % (ref 11.5–15.5)
WBC: 6.1 10*3/uL (ref 4.0–10.5)
nRBC: 0 % (ref 0.0–0.2)

## 2018-07-31 LAB — URINALYSIS, ROUTINE W REFLEX MICROSCOPIC
BILIRUBIN URINE: NEGATIVE
Glucose, UA: NEGATIVE mg/dL
Hgb urine dipstick: NEGATIVE
KETONES UR: 5 mg/dL — AB
LEUKOCYTES UA: NEGATIVE
Nitrite: NEGATIVE
PH: 6 (ref 5.0–8.0)
Protein, ur: NEGATIVE mg/dL
Specific Gravity, Urine: 1.018 (ref 1.005–1.030)

## 2018-07-31 LAB — COMPREHENSIVE METABOLIC PANEL
ALBUMIN: 4 g/dL (ref 3.5–5.0)
ALT: 31 U/L (ref 0–44)
AST: 52 U/L — ABNORMAL HIGH (ref 15–41)
Alkaline Phosphatase: 56 U/L (ref 38–126)
Anion gap: 11 (ref 5–15)
BUN: 8 mg/dL (ref 8–23)
CO2: 25 mmol/L (ref 22–32)
Calcium: 9 mg/dL (ref 8.9–10.3)
Chloride: 100 mmol/L (ref 98–111)
Creatinine, Ser: 0.89 mg/dL (ref 0.44–1.00)
GFR calc non Af Amer: >60 mL/min (ref >60)
Glucose, Bld: 104 mg/dL — ABNORMAL HIGH (ref 70–99)
Potassium: 3.9 mmol/L (ref 3.5–5.1)
Sodium: 136 mmol/L (ref 135–145)
Total Bilirubin: 0.7 mg/dL (ref 0.3–1.2)
Total Protein: 7.3 g/dL (ref 6.5–8.1)

## 2018-07-31 LAB — CG4 I-STAT (LACTIC ACID): LACTIC ACID, VENOUS: 1.07 mmol/L (ref 0.5–1.9)

## 2018-07-31 MED ORDER — IOPAMIDOL (ISOVUE-370) INJECTION 76%
INTRAVENOUS | Status: AC
  Filled 2018-07-31: qty 100

## 2018-07-31 MED ORDER — SODIUM CHLORIDE (PF) 0.9 % IJ SOLN
INTRAMUSCULAR | Status: AC
  Filled 2018-07-31: qty 50

## 2018-07-31 MED ORDER — SODIUM CHLORIDE 0.9 % IV BOLUS
2000.0000 mL | Freq: Once | INTRAVENOUS | Status: AC
  Administered 2018-07-31: 2000 mL via INTRAVENOUS

## 2018-07-31 MED ORDER — IOPAMIDOL (ISOVUE-370) INJECTION 76%
100.0000 mL | Freq: Once | INTRAVENOUS | Status: AC | PRN
  Administered 2018-07-31: 100 mL via INTRAVENOUS

## 2018-07-31 MED ORDER — LEVOFLOXACIN IN D5W 750 MG/150ML IV SOLN
750.0000 mg | Freq: Once | INTRAVENOUS | Status: AC
  Administered 2018-07-31: 750 mg via INTRAVENOUS
  Filled 2018-07-31: qty 150

## 2018-07-31 MED ORDER — LEVOFLOXACIN 500 MG PO TABS: 500.0000 mg | ORAL_TABLET | Freq: Every day | ORAL | 0 refills | Status: DC

## 2018-07-31 MED ORDER — LORAZEPAM 0.5 MG PO TABS: 0.5000 mg | ORAL_TABLET | Freq: Four times a day (QID) | ORAL | 0 refills | Status: DC | PRN

## 2018-07-31 MED ORDER — SODIUM CHLORIDE 0.9 % IV SOLN
INTRAVENOUS | Status: DC
  Administered 2018-07-31: 19:00:00 via INTRAVENOUS

## 2018-07-31 MED ORDER — METOCLOPRAMIDE HCL 5 MG/ML IJ SOLN
5.0000 mg | Freq: Once | INTRAMUSCULAR | Status: AC
  Administered 2018-07-31: 5 mg via INTRAVENOUS
  Filled 2018-07-31: qty 2

## 2018-07-31 NOTE — ED Notes (Signed)
ED Provider at bedside. 

## 2018-07-31 NOTE — ED Triage Notes (Signed)
The  Pt was just discharged from [facility] earlier today dx pneumonia  She was on her way home when her chest pain started hurting and she came here because it was close to the drug store  Blood work etc was at [facility] long ed earlier.

## 2018-07-31 NOTE — ED Triage Notes (Signed)
Pt collaborates that she was seen on christmas eve and diagnosed with community acquired pneumonia, sent home wit a 4 day curse of abx and return precaution for worsening symptoms, she reports her symptoms have only worsened.

## 2018-07-31 NOTE — ED Notes (Signed)
Pt discussed with dr Valeriano Singer  No labs only ekg

## 2018-07-31 NOTE — ED Provider Notes (Addendum)
[facility] DEPT Provider Note   CSN: [id number] Arrival date & time: 07/31/18  1444     History   Chief Complaint Chief Complaint  Patient presents with  . Pneumonia    Worsening Sx    HPI Veronica Robles is a 63 y.o. female.  63 year old female who has history of HIV who presents with increased weakness and dyspnea on exertion with intermittent vomiting.  Was diagnosed with pneumonia 10 days ago and completed a course of azithromycin.  Since that time she has had nonproductive cough without fevers.  Has had vomiting which seems to come and go.  No associated abdominal discomfort with this.  Symptoms have been progressively worse which is why she presents today.     Past Medical History:  Diagnosis Date  . HIV infection ([place])   . Hypertension     Patient Active Problem List   Diagnosis Date Noted  . 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 HYPERTENSION, BENIGN 01/26/2007  . 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 AIDS 10/26/2006  . HX, PERSONAL, PENICILLIN ALLERGY 10/26/2006  . HX, PERSONAL, DRUG ALLERGY NOS 10/26/2006    Past Surgical History:  Procedure Laterality Date  . ABDOMINAL HYSTERECTOMY    . APPENDECTOMY    . BREAST BIOPSY Right 2015  . CYSTOSCOPY/RETROGRADE/URETEROSCOPY Right 11/14/2016   Procedure: CYSTOSCOPY/RETROGRADE/URETEROSCOPY/ STONE EXTRACTION/HOLMIUM LASER/STENT PLACEMENT;  Surgeon: Kathie Rhodes, MD;  Location: [facility];  Service: Urology;  Laterality: Right;     OB History    Gravida  1   Para  1   Term  1   Preterm      AB      Living  1     SAB      TAB      Ectopic      Multiple      Live Births               Home Medications    Prior to Admission medications   Medication Sig Start Date End Date Taking? Authorizing Provider  atazanavir-cobicistat (EVOTAZ) 300-150 MG tablet Take 1 tablet by mouth daily. Swallow whole. Do NOT crush, cut or chew tablet. Take  with food. 05/10/18   Carlyle Basques, MD  Blood Pressure Monitoring (ADULT BLOOD PRESSURE CUFF LG) KIT      [provider]  DM-Phenylephrine-Acetaminophen 10-5-325 MG/15ML LIQD Take 8 mLs by mouth daily as needed (cough).    [provider]  dolutegravir (TIVICAY) 50 MG tablet TAKE 1 TABLET(50 MG) BY MOUTH TWICE DAILY 05/10/18   Carlyle Basques, MD  lisinopril (PRINIVIL,ZESTRIL) 20 MG tablet Take 1 tablet (20 mg total) by mouth daily. 10/13/17   Carlyle Basques, MD  zidovudine (RETROVIR) 300 MG tablet Take 1 tablet (300 mg total) by mouth 2 (two) times daily. 05/10/18   Carlyle Basques, MD    Family History Family History  Problem Relation Age of Onset  . Hypertension Mother   . Cancer Father        pancreatic  . Breast cancer Sister     Social History Social History   Tobacco Use  . Smoking status: Former Smoker    Packs/day: 0.10    Types: Cigarettes  . Smokeless tobacco: Never Used  Substance Use Topics  . Alcohol use: No    Alcohol/week: 0.0 standard drinks  . Drug use: No     Allergies   Penicillins   Review of Systems Review of Systems  All other systems  reviewed and are negative.    Physical Exam Updated Vital Signs BP 116/76 (BP Location: Left Arm)   Pulse 95   Temp 98.2 F (36.8 C) (Oral)   Resp 20   Wt 52.8 kg   SpO2 98%   BMI 21.31 kg/m   Physical Exam Vitals signs and nursing note reviewed.  Constitutional:      General: She is not in acute distress.    Appearance: Normal appearance. She is well-developed. She is not toxic-appearing.  HENT:     Head: Normocephalic and atraumatic.  Eyes:     General: Lids are normal.     Conjunctiva/sclera: Conjunctivae normal.     Pupils: Pupils are equal, round, and reactive to light.  Neck:     Musculoskeletal: Normal range of motion and neck supple.     Thyroid: No thyroid mass.     Trachea: No tracheal deviation.  Cardiovascular:     Rate and Rhythm: Normal rate and regular  rhythm.     Heart sounds: Normal heart sounds. No murmur. No gallop.   Pulmonary:     Effort: Pulmonary effort is normal. No respiratory distress.     Breath sounds: Normal breath sounds. No stridor. No decreased breath sounds, wheezing, rhonchi or rales.  Abdominal:     General: Bowel sounds are normal. There is no distension.     Palpations: Abdomen is soft.     Tenderness: There is no abdominal tenderness. There is no rebound.  Musculoskeletal: Normal range of motion.        General: No tenderness.  Skin:    General: Skin is warm and dry.     Findings: No abrasion or rash.  Neurological:     Mental Status: She is alert and oriented to person, place, and time.     GCS: GCS eye subscore is 4. GCS verbal subscore is 5. GCS motor subscore is 6.     Cranial Nerves: No cranial nerve deficit.     Sensory: No sensory deficit.  Psychiatric:        Speech: Speech normal.        Behavior: Behavior normal.      ED Treatments / Results  Labs (all labs ordered are listed, but only abnormal results are displayed) Labs Reviewed  COMPREHENSIVE METABOLIC PANEL - Abnormal; Notable for the following components:      Result Value   Glucose, Bld 104 (*)    AST 52 (*)    All other components within normal limits  CBC WITH DIFFERENTIAL/PLATELET  URINALYSIS, ROUTINE W REFLEX MICROSCOPIC  I-STAT CG4 LACTIC ACID, ED  CG4 I-STAT (LACTIC ACID)    EKG None  Radiology Dg Chest 2 View  Result Date: 07/31/2018 CLINICAL DATA:  Pt reported that she was seen on christmas eve and diagnosed with community acquired pneumonia, sent home with a 4 day course of antibiotic and returned for worsening symptoms. She reported her symptoms of a cough for 3-4 weeks have not gone away. EXAM: CHEST - 2 VIEW COMPARISON:  07/20/2018 FINDINGS: Previously described lung base opacities are similar to the prior exam. Findings consistent with either persistent pneumonia, atelectasis or a combination. Remainder of the lungs  is clear. No pleural effusion or pneumothorax. Cardiac silhouette is normal in size. No mediastinal or hilar masses or evidence of adenopathy. Skeletal structures are unremarkable. IMPRESSION: 1. Persistent lower lobe, basilar opacities, without significant change from the prior study. May reflect persistent pneumonia, atelectasis or a combination. Remainder of the lungs  is clear. Electronically Signed   By: Lajean Manes M.D.   On: 07/31/2018 16:13    Procedures Procedures (including critical care time)  Medications Ordered in ED Medications  sodium chloride 0.9 % bolus 2,000 mL (has no administration in time range)  0.9 %  sodium chloride infusion (has no administration in time range)  iopamidol (ISOVUE-370) 76 % injection 100 mL (has no administration in time range)     Initial Impression / Assessment and Plan / ED Course  I have reviewed the triage vital signs and the nursing notes.  Pertinent labs & imaging results that were available during my care of the patient were reviewed by me and considered in my medical decision making (see chart for details).     Patient had chest x-ray which show possible pneumonia.  She subsequently had a chest CT which was negative for PE and says that what was seen on her plain films could be pneumonia versus just atelectasis.  Was given IV Levaquin here as she had completed a recent course of azithromycin.  Low suspicion that this is PCP pneumonia.  Patient's last viral load was undetectable.  Was also given IV hydration as well.  Will be discharged home  Final Clinical Impressions(s) / ED Diagnoses   Final diagnoses:  None    ED Discharge Orders    None       Lacretia Leigh, MD 07/31/18 2143    Lacretia Leigh, MD 07/31/18 2143

## 2018-08-01 ENCOUNTER — Other Ambulatory Visit: Payer: Self-pay

## 2018-08-01 NOTE — Discharge Instructions (Addendum)
Please follow-up with your primary care provider on this matter. Please take all of your antibiotics until finished!   You may develop abdominal discomfort or diarrhea from the antibiotic.  You may help offset this with probiotics which you can buy or get in yogurt. Do not eat or take the probiotics until 2 hours after your antibiotic.   Please try to get plenty of rest.  Return to the ED for chest pain, shortness of breath, confusion, passing out, or any other major concerns.

## 2018-08-01 NOTE — ED Provider Notes (Signed)
[facility] EMERGENCY DEPARTMENT Provider Note   CSN: [id number] Arrival date & time: 07/31/18  2257     History   Chief Complaint Chief Complaint  Patient presents with  . Chest Pain    HPI Veronica Robles is a 63 y.o. female.  HPI   Veronica Robles is a 63 y.o. female, with a history of HTN and HIV, presenting to the ED with palpitations beginning last night.   States she got upset last night at [facility] because no one was coming to help her to the bathroom. Her palpitations began after getting upset.  They resolved, but then they recurred when she was waiting at the pharmacy for her medicines because it was taking too long.  She asked her family members to take her to the ED last night.  Since arriving last night, her symptoms have resolved and have not recurred.  Denies shortness of breath, syncope, dizziness, diaphoresis, abdominal pain, N/V/D, fever, or any other complaints.  Last HIV quant and CD4 count were performed May 10, 2018 and were undetectable and 410, respectively.   Past Medical History:  Diagnosis Date  . HIV infection ([place])   . Hypertension     Patient Active Problem List   Diagnosis Date Noted  . 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 HYPERTENSION, BENIGN 01/26/2007  . 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 AIDS 10/26/2006  . HX, PERSONAL, PENICILLIN ALLERGY 10/26/2006  . HX, PERSONAL, DRUG ALLERGY NOS 10/26/2006    Past Surgical History:  Procedure Laterality Date  . ABDOMINAL HYSTERECTOMY    . APPENDECTOMY    . BREAST BIOPSY Right 2015  . CYSTOSCOPY/RETROGRADE/URETEROSCOPY Right 11/14/2016   Procedure: CYSTOSCOPY/RETROGRADE/URETEROSCOPY/ STONE EXTRACTION/HOLMIUM LASER/STENT PLACEMENT;  Surgeon: Kathie Rhodes, MD;  Location: [facility];  Service: Urology;  Laterality: Right;     OB History    Gravida  1   Para  1   Term  1   Preterm      AB      Living  1     SAB      TAB      Ectopic      Multiple      Live Births               Home Medications    Prior to Admission medications   Medication Sig Start Date End Date Taking? Authorizing Provider  atazanavir-cobicistat (EVOTAZ) 300-150 MG tablet Take 1 tablet by mouth daily. Swallow whole. Do NOT crush, cut or chew tablet. Take with food. 05/10/18   Carlyle Basques, MD  Blood Pressure Monitoring (ADULT BLOOD PRESSURE CUFF LG) KIT      [provider]  DM-Phenylephrine-Acetaminophen 10-5-325 MG/15ML LIQD Take 8 mLs by mouth daily as needed (cough).    [provider]  dolutegravir (TIVICAY) 50 MG tablet TAKE 1 TABLET(50 MG) BY MOUTH TWICE DAILY Patient taking differently: Take 50 mg by mouth daily.  05/10/18   Carlyle Basques, MD  levofloxacin (LEVAQUIN) 500 MG tablet Take 1 tablet (500 mg total) by mouth daily. 07/31/18   Lacretia Leigh, MD  lisinopril (PRINIVIL,ZESTRIL) 20 MG tablet Take 1 tablet (20 mg total) by mouth daily. 10/13/17   Carlyle Basques, MD  LORazepam (ATIVAN) 0.5 MG tablet Take 1 tablet (0.5 mg total) by mouth every 6 (six) hours as needed (Nausea). 07/31/18   Lacretia Leigh, MD  zidovudine (RETROVIR) 300 MG tablet Take 1 tablet (300 mg total) by mouth 2 (two)  times daily. 05/10/18   Carlyle Basques, MD    Family History Family History  Problem Relation Age of Onset  . Hypertension Mother   . Cancer Father        pancreatic  . Breast cancer Sister     Social History Social History   Tobacco Use  . Smoking status: Former Smoker    Packs/day: 0.10    Types: Cigarettes  . Smokeless tobacco: Never Used  Substance Use Topics  . Alcohol use: No    Alcohol/week: 0.0 standard drinks  . Drug use: No     Allergies   Penicillins   Review of Systems Review of Systems  Constitutional: Negative for chills, diaphoresis and fever.  Respiratory: Positive for cough (not new to this ED visit). Negative for shortness of breath.   Cardiovascular: Positive for palpitations.  Negative for chest pain and leg swelling.  Gastrointestinal: Negative for abdominal pain, diarrhea, nausea and vomiting.  Musculoskeletal: Negative for back pain and neck pain.  Neurological: Negative for dizziness, syncope, weakness, light-headedness and numbness.  All other systems reviewed and are negative.    Physical Exam Updated Vital Signs BP (!) 147/78 (BP Location: Right Arm)   Pulse 96   Temp 98.1 F (36.7 C) (Oral)   Resp 16   Ht _0  (1.575 m)   Wt 59.9 kg   SpO2 99%   BMI 24.14 kg/m   Physical Exam Vitals signs and nursing note reviewed.  Constitutional:      General: She is not in acute distress.    Appearance: She is well-developed. She is not diaphoretic.  HENT:     Head: Normocephalic and atraumatic.     Mouth/Throat:     Mouth: Mucous membranes are moist.     Pharynx: Oropharynx is clear.  Eyes:     Conjunctiva/sclera: Conjunctivae normal.  Neck:     Musculoskeletal: Neck supple.  Cardiovascular:     Rate and Rhythm: Normal rate and regular rhythm.     Pulses: Normal pulses.          Radial pulses are 2+ on the right side and 2+ on the left side.       Posterior tibial pulses are 2+ on the right side and 2+ on the left side.     Heart sounds: Normal heart sounds.  Pulmonary:     Effort: Pulmonary effort is normal. No respiratory distress.     Breath sounds: Normal breath sounds.  Abdominal:     Palpations: Abdomen is soft.     Tenderness: There is no abdominal tenderness. There is no guarding.  Musculoskeletal:     Right lower leg: No edema.     Left lower leg: No edema.  Lymphadenopathy:     Cervical: No cervical adenopathy.  Skin:    General: Skin is warm and dry.  Neurological:     Mental Status: She is alert.  Psychiatric:        Behavior: Behavior normal.      ED Treatments / Results  Labs (all labs ordered are listed, but only abnormal results are displayed) Labs Reviewed - No data to display    EKG EKG  Interpretation  Date/Time:  Saturday July 31 2018 23:09:43 EST Ventricular Rate:  97 PR Interval:  120 QRS Duration: 84 QT Interval:  334 QTC Calculation: 424 R Axis:   41 Text Interpretation:  Normal sinus rhythm ST & T wave abnormality, consider lateral ischemia Baseline wander Artifact Confirmed by Blanchie Dessert (  [id number]) on 08/01/2018 9:11:35 AM  ED ECG REPORT   Date: 08/01/2018  Rate: 80  Rhythm: normal sinus rhythm  QRS Axis: normal  Intervals: normal  ST/T Wave abnormalities: normal  Conduction Disutrbances:none  Narrative Interpretation:   Old EKG Reviewed: Improved from previous EKG earlier today  I have personally reviewed the EKG tracing and disagree with the computerized printout as noted.   Radiology Dg Chest 2 View  Result Date: 07/31/2018 CLINICAL DATA:  Pt reported that she was seen on christmas eve and diagnosed with community acquired pneumonia, sent home with a 4 day course of antibiotic and returned for worsening symptoms. She reported her symptoms of a cough for 3-4 weeks have not gone away. EXAM: CHEST - 2 VIEW COMPARISON:  07/20/2018 FINDINGS: Previously described lung base opacities are similar to the prior exam. Findings consistent with either persistent pneumonia, atelectasis or a combination. Remainder of the lungs is clear. No pleural effusion or pneumothorax. Cardiac silhouette is normal in size. No mediastinal or hilar masses or evidence of adenopathy. Skeletal structures are unremarkable. IMPRESSION: 1. Persistent lower lobe, basilar opacities, without significant change from the prior study. May reflect persistent pneumonia, atelectasis or a combination. Remainder of the lungs is clear. Electronically Signed   By: Lajean Manes M.D.   On: 07/31/2018 16:13   Ct Angio Chest Pe W/cm &/or Wo Cm  Result Date: 07/31/2018 CLINICAL DATA:  Patient diagnosed with pneumonia on 07/20/2018. Patient sent home with antibiotics. Symptoms now worsened. EXAM: CT  ANGIOGRAPHY CHEST WITH CONTRAST TECHNIQUE: Multidetector CT imaging of the chest was performed using the standard protocol during bolus administration of intravenous contrast. Multiplanar CT image reconstructions and MIPs were obtained to evaluate the vascular anatomy. CONTRAST:  170m ISOVUE-370 IOPAMIDOL (ISOVUE-370) INJECTION 76% COMPARISON:  Current chest radiographs and prior studies. FINDINGS: Cardiovascular: There is satisfactory opacification of the pulmonary arteries to the segmental level. There is no evidence of a pulmonary embolism. Heart is normal in size and configuration. No pericardial effusion. Three-vessel coronary artery calcifications. Great vessels are normal in caliber. Aortic atherosclerosis. Mediastinum/Nodes: No enlarged mediastinal, hilar, or axillary lymph nodes. Thyroid gland, trachea, and esophagus demonstrate no significant findings. Lungs/Pleura: Peribronchovascular opacities noted in both lower lobes, right greater than left, which may all reflect atelectasis. Pneumonia is possible. Remainder of the lungs is clear. No pleural effusion or pneumothorax. Upper Abdomen: No acute findings. Partly imaged gallstone and left renal cyst. Musculoskeletal: No chest wall abnormality. No acute or significant osseous findings. Review of the MIP images confirms the above findings. IMPRESSION: 1. No evidence of a pulmonary embolism. 2. Bilateral lower lobe opacity, right greater than left, most consistent with atelectasis. A component of pneumonia is possible. Remainder of the lungs is clear. 3. Coronary artery calcifications and aortic atherosclerosis. Aortic Atherosclerosis (ICD10-I70.0). Electronically Signed   By: DLajean ManesM.D.   On: 07/31/2018 17:34    Procedures Procedures (including critical care time)  Medications Ordered in ED Medications - No data to display   Initial Impression / Assessment and Plan / ED Course  I have reviewed the triage vital signs and the nursing  notes.  Pertinent labs & imaging results that were available during my care of the patient were reviewed by me and considered in my medical decision making (see chart for details).  Clinical Course as of Aug 02 1654  Sun Aug 01, 2018  0935 Patient states she has not yet taken her hypertension medication today.  BP(!): 147/78 [SJ]  Clinical Course User Index [SJ] Joy, Shawn C, PA-C    Patient presents with palpitations that occurred last night.  Symptoms have resolved. Chart review reveals patient was seen in the ED at [facility], diagnosed with persistent pneumonia, and discharged with prescriptions for Levaquin and lorazepam.  Lab work from yesterday appears unremarkable.  Serial EKGs today appear unremarkable. Return precautions discussed.  Patient voices understanding of these instructions, accepts the plan, and is comfortable with discharge.  Findings and plan of care discussed with Blanchie Dessert, MD.   Vitals:   07/31/18 2315 07/31/18 2319 08/01/18 0623  BP: (!) 150/81  (!) 147/78  Pulse: 96  96  Resp: 18  16  Temp: 98.1 F (36.7 C)  98.1 F (36.7 C)  TempSrc: Oral  Oral  SpO2: 98%  99%  Weight:  59.9 kg   Height:  _0  (1.575 m)      Final Clinical Impressions(s) / ED Diagnoses   Final diagnoses:  Palpitations    ED Discharge Orders    None       Layla Maw 08/02/18 1700    Blanchie Dessert, MD 08/02/18 2230

## 2018-08-28 ENCOUNTER — Encounter (HOSPITAL_COMMUNITY): Payer: Self-pay | Admitting: Emergency Medicine

## 2018-08-28 ENCOUNTER — Other Ambulatory Visit: Payer: Self-pay

## 2018-08-28 ENCOUNTER — Emergency Department (HOSPITAL_COMMUNITY): Payer: Self-pay

## 2018-08-28 ENCOUNTER — Emergency Department (HOSPITAL_COMMUNITY)
Admission: EM | Admit: 2018-08-28 | Discharge: 2018-08-28 | Disposition: A | Discharge status: Home / Self Care | Payer: Self-pay | Attending: Emergency Medicine | Admitting: Emergency Medicine

## 2018-08-28 DIAGNOSIS — R112 Nausea with vomiting, unspecified: Secondary | ICD-10-CM

## 2018-08-28 DIAGNOSIS — N201 Calculus of ureter: Secondary | ICD-10-CM

## 2018-08-28 DIAGNOSIS — Z87891 Personal history of nicotine dependence: Secondary | ICD-10-CM | POA: Insufficient documentation

## 2018-08-28 DIAGNOSIS — I10 Essential (primary) hypertension: Secondary | ICD-10-CM | POA: Insufficient documentation

## 2018-08-28 DIAGNOSIS — R10A1 Flank pain, right side: Secondary | ICD-10-CM

## 2018-08-28 DIAGNOSIS — Z79899 Other long term (current) drug therapy: Secondary | ICD-10-CM | POA: Insufficient documentation

## 2018-08-28 DIAGNOSIS — B20 Human immunodeficiency virus [HIV] disease: Secondary | ICD-10-CM | POA: Insufficient documentation

## 2018-08-28 DIAGNOSIS — R109 Unspecified abdominal pain: Secondary | ICD-10-CM | POA: Insufficient documentation

## 2018-08-28 DIAGNOSIS — K8020 Calculus of gallbladder without cholecystitis without obstruction: Secondary | ICD-10-CM

## 2018-08-28 LAB — CBC WITH DIFFERENTIAL/PLATELET
Abs Immature Granulocytes: 0.04 K/uL (ref 0.00–0.07)
Basophils Absolute: 0 K/uL (ref 0.0–0.1)
Basophils Relative: 0 %
Eosinophils Absolute: 0 K/uL (ref 0.0–0.5)
Eosinophils Relative: 0 %
HCT: 42.4 % (ref 36.0–46.0)
Hemoglobin: 13.5 g/dL (ref 12.0–15.0)
Immature Granulocytes: 1 %
Lymphocytes Relative: 27 %
Lymphs Abs: 1.7 K/uL (ref 0.7–4.0)
MCH: 28.2 pg (ref 26.0–34.0)
MCHC: 31.8 g/dL (ref 30.0–36.0)
MCV: 88.5 fL (ref 80.0–100.0)
Monocytes Absolute: 0.6 K/uL (ref 0.1–1.0)
Monocytes Relative: 10 %
Neutro Abs: 3.7 K/uL (ref 1.7–7.7)
Neutrophils Relative %: 62 %
Platelets: 226 K/uL (ref 150–400)
RBC: 4.79 MIL/uL (ref 3.87–5.11)
RDW: 13.2 % (ref 11.5–15.5)
WBC: 6.1 K/uL (ref 4.0–10.5)
nRBC: 0 % (ref 0.0–0.2)

## 2018-08-28 LAB — URINALYSIS, ROUTINE W REFLEX MICROSCOPIC
Bacteria, UA: NONE SEEN
Bilirubin Urine: NEGATIVE
Glucose, UA: NEGATIVE mg/dL
Hgb urine dipstick: NEGATIVE
Ketones, ur: 5 mg/dL — AB
Nitrite: NEGATIVE
Protein, ur: 30 mg/dL — AB
Specific Gravity, Urine: 1.015 (ref 1.005–1.030)
pH: 6 (ref 5.0–8.0)

## 2018-08-28 LAB — COMPREHENSIVE METABOLIC PANEL WITH GFR
ALT: 34 U/L (ref 0–44)
AST: 50 U/L — ABNORMAL HIGH (ref 15–41)
Albumin: 4.4 g/dL (ref 3.5–5.0)
Alkaline Phosphatase: 62 U/L (ref 38–126)
Anion gap: 9 (ref 5–15)
BUN: 8 mg/dL (ref 8–23)
CO2: 26 mmol/L (ref 22–32)
Calcium: 9.2 mg/dL (ref 8.9–10.3)
Chloride: 97 mmol/L — ABNORMAL LOW (ref 98–111)
Creatinine, Ser: 0.82 mg/dL (ref 0.44–1.00)
GFR calc Af Amer: >60 mL/min
GFR calc non Af Amer: >60 mL/min
Glucose, Bld: 102 mg/dL — ABNORMAL HIGH (ref 70–99)
Potassium: 4.1 mmol/L (ref 3.5–5.1)
Sodium: 132 mmol/L — ABNORMAL LOW (ref 135–145)
Total Bilirubin: 1.2 mg/dL (ref 0.3–1.2)
Total Protein: 8.5 g/dL — ABNORMAL HIGH (ref 6.5–8.1)

## 2018-08-28 LAB — LIPASE, BLOOD: Lipase: 47 U/L (ref 11–51)

## 2018-08-28 MED ORDER — SODIUM CHLORIDE 0.9 % IV BOLUS
1000.0000 mL | Freq: Once | INTRAVENOUS | Status: AC
  Administered 2018-08-28: 1000 mL via INTRAVENOUS

## 2018-08-28 MED ORDER — TAMSULOSIN HCL 0.4 MG PO CAPS: 0.4000 mg | ORAL_CAPSULE | Freq: Every day | ORAL | 0 refills | Status: DC

## 2018-08-28 MED ORDER — HYDROCODONE-ACETAMINOPHEN 5-325 MG PO TABS: 1.0000 | ORAL_TABLET | Freq: Four times a day (QID) | ORAL | 0 refills | Status: DC | PRN

## 2018-08-28 MED ORDER — ONDANSETRON 4 MG PO TBDP: 4.0000 mg | ORAL_TABLET | Freq: Three times a day (TID) | ORAL | 0 refills | Status: DC | PRN

## 2018-08-28 MED ORDER — MORPHINE SULFATE (PF) 4 MG/ML IV SOLN
4.0000 mg | Freq: Once | INTRAVENOUS | Status: AC
  Administered 2018-08-28: 4 mg via INTRAVENOUS
  Filled 2018-08-28: qty 1

## 2018-08-28 MED ORDER — NAPROXEN 375 MG PO TABS: 375.0000 mg | ORAL_TABLET | Freq: Two times a day (BID) | ORAL | 0 refills | Status: DC | PRN

## 2018-08-28 MED ORDER — SODIUM CHLORIDE 0.9% FLUSH
3.0000 mL | Freq: Once | INTRAVENOUS | Status: AC
  Administered 2018-08-28: 3 mL via INTRAVENOUS

## 2018-08-28 MED ORDER — ONDANSETRON HCL 4 MG/2ML IJ SOLN
4.0000 mg | Freq: Once | INTRAMUSCULAR | Status: AC
  Administered 2018-08-28: 4 mg via INTRAVENOUS
  Filled 2018-08-28: qty 2

## 2018-08-28 NOTE — ED Triage Notes (Signed)
Patient c/o right flank pain radiating to RLQ since last night. Reports vomiting. Denies diarrhea. Hx kidney stones. Denies urinary sx.

## 2018-08-28 NOTE — ED Notes (Signed)
Patient given discharge teaching and verbalized understanding. Patient will be taken out of ED with wheelchair.

## 2018-08-28 NOTE — Discharge Instructions (Addendum)
Take naprosyn as directed as needed for pain using norco for breakthrough pain. Do not drive or operate machinery with pain medication use. May need over-the-counter stool softener with this pain medication use. Use Zofran as needed for nausea. Use Flomax as directed, as this medication will help you pass the stone. Strain all urine to try to catch the stone when it passes. Follow-up with the urologist in the next 1 to 2 weeks for recheck of ongoing pain, however for intractable or uncontrollable symptoms at home then return to the [facility] emergency department.  °  °

## 2018-08-28 NOTE — ED Provider Notes (Signed)
[facility] DEPT Provider Note   CSN: [id number] Arrival date & time: 08/28/18  1321     History   Chief Complaint Chief Complaint  Patient presents with  . Flank Pain  . Abdominal Pain    HPI Veronica Robles is a 63 y.o. female with a PMHx of HIV (last CD4 was 24 on 05/10/18), HTN, and kidney stones, with PSHx of abd hysterectomy, appendectomy, and prior kidney stone extraction, who presents to the ED with complaints of right flank pain that began around 12-1 AM.  She describes her pain is 8/10 intermittent crampy right flank pain that radiates into the right abdomen, with no known aggravating factors, no treatments tried prior to arrival.  She reports associated nausea, 2 episodes of nonbloody nonbilious emesis, and increased urinary frequency.  She states this feels like prior kidney stones.  She mentions that she has some mild constipation at baseline, her last bowel movement was either yesterday or the day before, but she states that this is normal for her and unchanged from her baseline.  Recently she has been eating a lot of soup because she had pneumonia, but yesterday she had a happy meal, which is the only suspicious food intake recently.  She denies any fevers, chills, chest pain, shortness of breath, hematemesis, diarrhea, obstipation, melena, hematochezia, dysuria, hematuria, vaginal bleeding or discharge, myalgias, arthralgias, numbness, tingling, focal weakness, or any other complaints at this time.  She denies any recent travel, sick contacts, alcohol use, or frequent NSAID use.  Has not seen urology in several years.   The history is provided by the patient and medical records. No language interpreter was used.    Past Medical History:  Diagnosis Date  . HIV infection ([place])   . Hypertension     Patient Active Problem List   Diagnosis Date Noted  . 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 HYPERTENSION, BENIGN  01/26/2007  . 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 AIDS 10/26/2006  . HX, PERSONAL, PENICILLIN ALLERGY 10/26/2006  . HX, PERSONAL, DRUG ALLERGY NOS 10/26/2006    Past Surgical History:  Procedure Laterality Date  . ABDOMINAL HYSTERECTOMY    . APPENDECTOMY    . BREAST BIOPSY Right 2015  . CYSTOSCOPY/RETROGRADE/URETEROSCOPY Right 11/14/2016   Procedure: CYSTOSCOPY/RETROGRADE/URETEROSCOPY/ STONE EXTRACTION/HOLMIUM LASER/STENT PLACEMENT;  Surgeon: Kathie Rhodes, MD;  Location: [facility];  Service: Urology;  Laterality: Right;     OB History    Gravida  1   Para  1   Term  1   Preterm      AB      Living  1     SAB      TAB      Ectopic      Multiple      Live Births               Home Medications    Prior to Admission medications   Medication Sig Start Date End Date Taking? Authorizing Provider  atazanavir-cobicistat (EVOTAZ) 300-150 MG tablet Take 1 tablet by mouth daily. Swallow whole. Do NOT crush, cut or chew tablet. Take with food. 05/10/18   Carlyle Basques, MD  Blood Pressure Monitoring (ADULT BLOOD PRESSURE CUFF LG) KIT      [provider]  DM-Phenylephrine-Acetaminophen 10-5-325 MG/15ML LIQD Take 8 mLs by mouth daily as needed (cough).    [provider]  dolutegravir (TIVICAY) 50 MG tablet TAKE 1 TABLET(50 MG) BY MOUTH TWICE DAILY Patient taking  differently: Take 50 mg by mouth daily.  05/10/18   Carlyle Basques, MD  levofloxacin (LEVAQUIN) 500 MG tablet Take 1 tablet (500 mg total) by mouth daily. 07/31/18   Lacretia Leigh, MD  lisinopril (PRINIVIL,ZESTRIL) 20 MG tablet Take 1 tablet (20 mg total) by mouth daily. 10/13/17   Carlyle Basques, MD  LORazepam (ATIVAN) 0.5 MG tablet Take 1 tablet (0.5 mg total) by mouth every 6 (six) hours as needed (Nausea). 07/31/18   Lacretia Leigh, MD  zidovudine (RETROVIR) 300 MG tablet Take 1 tablet (300 mg total) by mouth 2 (two) times daily. 05/10/18   Carlyle Basques, MD    Family History Family History    Problem Relation Age of Onset  . Hypertension Mother   . Cancer Father        pancreatic  . Breast cancer Sister     Social History Social History   Tobacco Use  . Smoking status: Former Smoker    Packs/day: 0.10    Types: Cigarettes  . Smokeless tobacco: Never Used  Substance Use Topics  . Alcohol use: No    Alcohol/week: 0.0 standard drinks  . Drug use: No     Allergies   Penicillins   Review of Systems Review of Systems  Constitutional: Negative for chills and fever.  Respiratory: Negative for shortness of breath.   Cardiovascular: Negative for chest pain.  Gastrointestinal: Positive for abdominal pain, constipation (chronic, unchanged for her), nausea and vomiting. Negative for blood in stool and diarrhea.  Genitourinary: Positive for flank pain and frequency. Negative for dysuria, hematuria, vaginal bleeding and vaginal discharge.  Musculoskeletal: Negative for arthralgias and myalgias.  Skin: Negative for color change.  Allergic/Immunologic: Positive for immunocompromised state (HIV).  Neurological: Negative for weakness and numbness.  Psychiatric/Behavioral: Negative for confusion.   All other systems reviewed and are negative for acute change except as noted in the HPI.    Physical Exam Updated Vital Signs BP (!) 147/89 (BP Location: Left Arm)   Pulse (!) 104   Temp 97.7 F (36.5 C) (Oral)   Resp 18   SpO2 100%   Physical Exam Vitals signs and nursing note reviewed.  Constitutional:      General: She is not in acute distress.    Appearance: Normal appearance. She is well-developed. She is not toxic-appearing.     Comments: Afebrile, nontoxic, NAD  HENT:     Head: Normocephalic and atraumatic.  Eyes:     General:        Right eye: No discharge.        Left eye: No discharge.     Conjunctiva/sclera: Conjunctivae normal.  Neck:     Musculoskeletal: Normal range of motion and neck supple.  Cardiovascular:     Rate and Rhythm: Normal rate and  regular rhythm.     Pulses: Normal pulses.     Heart sounds: Normal heart sounds, S1 normal and S2 normal. No murmur. No friction rub. No gallop.      Comments: No tachycardia on exam Pulmonary:     Effort: Pulmonary effort is normal. No respiratory distress.     Breath sounds: Normal breath sounds. No decreased breath sounds, wheezing, rhonchi or rales.  Abdominal:     General: Bowel sounds are normal. There is no distension.     Palpations: Abdomen is soft. Abdomen is not rigid.     Tenderness: There is abdominal tenderness in the right upper quadrant and right lower quadrant. There is no right CVA tenderness, left  CVA tenderness, guarding or rebound. Negative signs include Murphy's sign and McBurney's sign.     Comments: Soft, nondistended, +BS throughout, with mild RUQ and RLQ TTP tracking towards the R flank area, no r/g/r, neg murphy's although pain elicited with palpation of RUQ, neg mcburney's, no CVA TTP   Musculoskeletal: Normal range of motion.  Skin:    General: Skin is warm and dry.     Findings: No rash.  Neurological:     Mental Status: She is alert and oriented to person, place, and time.     Sensory: Sensation is intact. No sensory deficit.     Motor: Motor function is intact.  Psychiatric:        Mood and Affect: Mood and affect normal.        Behavior: Behavior normal.      ED Treatments / Results  Labs (all labs ordered are listed, but only abnormal results are displayed) Labs Reviewed  COMPREHENSIVE METABOLIC PANEL - Abnormal; Notable for the following components:      Result Value   Sodium 132 (*)    Chloride 97 (*)    Glucose, Bld 102 (*)    Total Protein 8.5 (*)    AST 50 (*)    All other components within normal limits  URINALYSIS, ROUTINE W REFLEX MICROSCOPIC - Abnormal; Notable for the following components:   APPearance HAZY (*)    Ketones, ur 5 (*)    Protein, ur 30 (*)    Leukocytes, UA TRACE (*)    All other components within normal limits    LIPASE, BLOOD  CBC WITH DIFFERENTIAL/PLATELET    EKG None  Radiology Ct Renal Stone Study  Result Date: 08/28/2018 CLINICAL DATA:  Acute right flank pain. EXAM: CT ABDOMEN AND PELVIS WITHOUT CONTRAST TECHNIQUE: Multidetector CT imaging of the abdomen and pelvis was performed following the standard protocol without IV contrast. COMPARISON:  CT scan of October 26, 2016. FINDINGS: Lower chest: No acute abnormality. Hepatobiliary: Large solitary gallstone is noted without inflammation. No biliary dilatation is noted. No focal hepatic lesion is seen on these unenhanced images. Pancreas: Unremarkable. No pancreatic ductal dilatation or surrounding inflammatory changes. Spleen: Normal in size without focal abnormality. Adrenals/Urinary Tract: Adrenal glands appear normal. Left kidney and ureter are unremarkable. Urinary bladder is unremarkable. Severe right hydroureteronephrosis is noted with cortical thinning. This appears to be due to 3 mm calculus in the distal right ureter. Some degree of stricture may be present as well. Stomach/Bowel: The stomach appears normal. There is no evidence of bowel obstruction or inflammation. Status post appendectomy. Vascular/Lymphatic: Aortic atherosclerosis. No enlarged abdominal or pelvic lymph nodes. Reproductive: Status post hysterectomy. No adnexal masses. Other: No abdominal wall hernia or abnormality. No abdominopelvic ascites. Musculoskeletal: No acute or significant osseous findings. IMPRESSION: Severe right hydroureteronephrosis with cortical thinning is again noted. There appears to be a 3 mm calculus in the distal right ureter which may represent residual fragment from prior largest stone seen in this area on prior exam. 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 Large solitary gallstone without inflammation. Aortic Atherosclerosis (ICD10-I70.0). Electronically Signed   By: Marijo Conception, M.D.   On: 08/28/2018 15:31    Procedures Procedures (including  critical care time)  Medications Ordered in ED Medications  sodium chloride flush (NS) 0.9 % injection 3 mL (3 mLs Intravenous Given 08/28/18 1431)  sodium chloride 0.9 % bolus 1,000 mL (1,000 mLs Intravenous New Bag/Given 08/28/18 1431)  morphine 4 MG/ML injection 4  mg (4 mg Intravenous Given 08/28/18 1431)  ondansetron (ZOFRAN) injection 4 mg (4 mg Intravenous Given 08/28/18 1431)     Initial Impression / Assessment and Plan / ED Course  I have reviewed the triage vital signs and the nursing notes.  Pertinent labs & imaging results that were available during my care of the patient were reviewed by me and considered in my medical decision making (see chart for details).     63 y.o. female here with right flank pain that began around midnight to 1 AM.  On exam, mild right upper and right lower quadrant tenderness tracking towards the right flank, no focal CVA tenderness.  Non-peritoneal abdomen.  Negative Murphy sign although pain elicited with palpation of the right upper quadrant.  Patient states this feels like prior kidney stones, will proceed with evaluation for kidney stone, if unremarkable may consider RUQ U/S.  Will give pain medicine, nausea medicine, and reassess shortly.  3:42 PM CBC w/diff WNL. CMP with marginally elevated AST 50 similar to prior labs, but otherwise fairly unremarkable. Lipase WNL. U/A without evidence of UTI and no significant hematuria. 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 Pt feeling better, will PO challenge, if tolerating PO well then can d/c home with pain/nausea meds and have her f/up with urology. Will reassess shortly.   4:04 PM Pt tolerating PO well. Will send home with zofran, naprosyn, norco, and flomax. Urine strainer given. Advised staying hydrated. F/up with urology in 1-2wks but strict  return precautions advised. I explained the diagnosis and have given explicit precautions to return to the ER including for any other new or worsening symptoms. The patient understands and accepts the medical plan as it's been dictated and I have answered their questions. Discharge instructions concerning home care and prescriptions have been given. The patient is STABLE and is discharged to home in good condition.    Final Clinical Impressions(s) / ED Diagnoses   Final diagnoses:  Right flank pain  Ureterolithiasis  Nausea and vomiting in adult patient  Calculus of gallbladder without cholecystitis without obstruction    ED Discharge Orders         Ordered    naproxen (NAPROSYN) 375 MG tablet  2 times daily PRN     08/28/18 1550    HYDROcodone-acetaminophen (NORCO) 5-325 MG tablet  Every 6 hours PRN     08/28/18 1550    ondansetron (ZOFRAN ODT) 4 MG disintegrating tablet  Every 8 hours PRN     08/28/18 1550    tamsulosin (FLOMAX) 0.4 MG CAPS capsule  Daily after supper     08/28/18 [address] [place]4    HIsla Pence MD 09/01/18 [phone]

## 2018-08-28 NOTE — ED Notes (Signed)
ED Provider at bedside. 

## 2018-09-23 ENCOUNTER — Encounter: Payer: Self-pay | Admitting: Internal Medicine

## 2018-10-05 ENCOUNTER — Other Ambulatory Visit: Payer: Self-pay | Admitting: Internal Medicine

## 2018-11-02 ENCOUNTER — Other Ambulatory Visit: Payer: Self-pay | Admitting: Internal Medicine

## 2018-11-03 ENCOUNTER — Other Ambulatory Visit: Payer: Self-pay | Admitting: Internal Medicine

## 2018-11-03 DIAGNOSIS — Z1231 Encounter for screening mammogram for malignant neoplasm of breast: Secondary | ICD-10-CM

## 2018-11-09 ENCOUNTER — Encounter: Payer: Self-pay | Admitting: Family

## 2018-11-09 ENCOUNTER — Ambulatory Visit (INDEPENDENT_AMBULATORY_CARE_PROVIDER_SITE_OTHER): Payer: Self-pay | Admitting: Family

## 2018-11-09 ENCOUNTER — Other Ambulatory Visit: Payer: Self-pay

## 2018-11-09 DIAGNOSIS — B20 Human immunodeficiency virus [HIV] disease: Secondary | ICD-10-CM

## 2018-11-09 DIAGNOSIS — Z0000 Encounter for general adult medical examination without abnormal findings: Secondary | ICD-10-CM | POA: Insufficient documentation

## 2018-11-09 MED ORDER — DOLUTEGRAVIR SODIUM 50 MG PO TABS: ORAL_TABLET | ORAL | 6 refills | Status: DC

## 2018-11-09 MED ORDER — ATAZANAVIR-COBICISTAT 300-150 MG PO TABS: 1.0000 | ORAL_TABLET | Freq: Every day | ORAL | 6 refills | Status: DC

## 2018-11-09 MED ORDER — ZIDOVUDINE 300 MG PO TABS: 300.0000 mg | ORAL_TABLET | Freq: Two times a day (BID) | ORAL | 6 refills | Status: DC

## 2018-11-09 NOTE — Patient Instructions (Signed)
Nice to speak with you today!  Please continue take your Evotaz-Retrovir-Descovy as prescribed.  Plan for follow-up in 3 months or sooner if needed with lab work following her appointment with Dr. Baxter Flattery  Have a wonderful day!

## 2018-11-09 NOTE — Progress Notes (Signed)
Subjective:    Patient ID: Veronica Robles, female    DOB: 09/13/1955, 63 y.o.   MRN: [id number]  Chief Complaint  Patient presents with  . HIV Positive/AIDS     Virtual Visit via Telephone Note   I connected with Ms. Sherri Rad on 11/09/2018 at 11:15 am by telephone and verified that I am speaking with the correct person using two identifiers.   I discussed the limitations, risks, security and privacy concerns of performing an evaluation and management service by telephone and the availability of in person appointments. I also discussed with the patient that there may be a patient responsible charge related to this service. The patient expressed understanding and agreed to proceed. Healthcare maintenance due includes colonoscopy and mammogram.     HPI:  Veronica Robles is a 63 y.o. female with HIV disease with decent adherence and good tolerance of her ART regimen of Evotaz-Tivicay-and Retrovir. Her viral load at the time was undetectable with CD4 count of 410.   Veronica Robles has been taking her Evotaz-Tivicay- Retrovir as prescribed with approximately 3 missed doses in the last 30 days with no adverse side effects.  She describes feeling good. Denies fevers, chills, night sweats, headaches, changes in vision, neck pain/stiffness, nausea, diarrhea, vomiting, lesions or rashes.  Veronica Robles remains covered through medication assistance received from [facility] and picks up her medication from [facility] without complication.  She is not currently working due to the coronavirus, however she is able to receive a paycheck as her employer has obtained a small business loan.  She is eager to get back to work.  She was scheduled for mammogram which had to be rescheduled also due to coronavirus.  She denies feelings of being down, depressed, or hopeless in the last 2 weeks.  No recreational or illicit drugs presently.  Allergies  Allergen Reactions  . Penicillins Rash    Has patient had a  PCN reaction causing immediate rash, facial/tongue/throat swelling, SOB or lightheadedness with hypotension: No Has patient had a PCN reaction causing severe rash involving mucus membranes or skin necrosis: No Has patient had a PCN reaction that required hospitalization No Has patient had a PCN reaction occurring within the last 10 years: No If all of the above answers are "NO", then may proceed with Cephalosporin use.       Outpatient Medications Prior to Visit  Medication Sig Dispense Refill  . Blood Pressure Monitoring (ADULT BLOOD PRESSURE CUFF LG) KIT      . DM-Phenylephrine-Acetaminophen 10-5-325 MG/15ML LIQD Take 8 mLs by mouth daily as needed (cough).    Marland Kitchen HYDROcodone-acetaminophen (NORCO) 5-325 MG tablet Take 1 tablet by mouth every 6 (six) hours as needed for severe pain. 10 tablet 0  . lisinopril (PRINIVIL,ZESTRIL) 20 MG tablet TAKE 1 TABLET BY MOUTH EVERY DAY 30 tablet 5  . LORazepam (ATIVAN) 0.5 MG tablet Take 1 tablet (0.5 mg total) by mouth every 6 (six) hours as needed (Nausea). 10 tablet 0  . naproxen (NAPROSYN) 375 MG tablet Take 1 tablet (375 mg total) by mouth 2 (two) times daily as needed for mild pain, moderate pain or headache (TAKE WITH MEALS.). 20 tablet 0  . ondansetron (ZOFRAN ODT) 4 MG disintegrating tablet Take 1 tablet (4 mg total) by mouth every 8 (eight) hours as needed for nausea or vomiting. 15 tablet 0  . tamsulosin (FLOMAX) 0.4 MG CAPS capsule Take 1 capsule (0.4 mg total) by mouth daily after supper. Take until the stone  passes, then stop taking 15 capsule 0  . atazanavir-cobicistat (EVOTAZ) 300-150 MG tablet Take 1 tablet by mouth daily. Swallow whole. Do NOT crush, cut or chew tablet. Take with food. 30 tablet 6  . dolutegravir (TIVICAY) 50 MG tablet TAKE 1 TABLET(50 MG) BY MOUTH TWICE DAILY (Patient taking differently: Take 50 mg by mouth daily. ) 60 tablet 6  . zidovudine (RETROVIR) 300 MG tablet Take 1 tablet (300 mg total) by mouth 2 (two) times daily.  60 tablet 6  . levofloxacin (LEVAQUIN) 500 MG tablet Take 1 tablet (500 mg total) by mouth daily. (Patient not taking: Reported on 11/09/2018) 7 tablet 0   Facility-Administered Medications Prior to Visit  Medication Dose Route Frequency Provider Last Rate Last Dose  . ciprofloxacin (CIPRO) IVPB 400 mg  400 mg Intravenous Q12H Kathie Rhodes, MD   400 mg at 11/14/16 6599     Past Medical History:  Diagnosis Date  . HIV infection ([place])   . Hypertension      Past Surgical History:  Procedure Laterality Date  . ABDOMINAL HYSTERECTOMY    . APPENDECTOMY    . BREAST BIOPSY Right 2015  . CYSTOSCOPY/RETROGRADE/URETEROSCOPY Right 11/14/2016   Procedure: CYSTOSCOPY/RETROGRADE/URETEROSCOPY/ STONE EXTRACTION/HOLMIUM LASER/STENT PLACEMENT;  Surgeon: Kathie Rhodes, MD;  Location: [facility];  Service: Urology;  Laterality: Right;   Review of Systems  Constitutional: Negative for appetite change, chills, diaphoresis, fatigue, fever and unexpected weight change.  Eyes:       Negative for acute change in vision  Respiratory: Negative for chest tightness, shortness of breath and wheezing.   Cardiovascular: Negative for chest pain.  Gastrointestinal: Negative for diarrhea, nausea and vomiting.  Genitourinary: Negative for dysuria, pelvic pain and vaginal discharge.  Musculoskeletal: Negative for neck pain and neck stiffness.  Skin: Negative for rash.  Neurological: Negative for seizures, syncope, weakness and headaches.  Hematological: Negative for adenopathy. Does not bruise/bleed easily.  Psychiatric/Behavioral: Negative for hallucinations.      Objective:    There were no vitals taken for this visit. Nursing note and vital signs reviewed.  Physical Exam    Veronica Robles is pleasant to speak with and sounds like she is doing very well. Assessment & Plan:   Problem List Items Addressed This Visit      Other   HIV disease ([place]) - Primary    Veronica Robles has well-controlled HIV disease with good  tolerance and decent adherence to her ART regimen.  No symptoms of opportunistic infection or progressive HIV disease at present.  She has no problems obtaining her medication and [facility] is up-to-date through September.  Continue current dose of the Evotaz-Descovy- Retrovir.  Plan for follow-up in 3 months or sooner if needed with lab work following appointment.      Relevant Medications   zidovudine (RETROVIR) 300 MG tablet   dolutegravir (TIVICAY) 50 MG tablet   atazanavir-cobicistat (EVOTAZ) 300-150 MG tablet   Healthcare maintenance     Due for breast cancer screening which is scheduled and pending changes due to coronavirus  Will refer to [facility] if needed at next office visit.   Discussed importance of safe sexual practice to reduce risk of acquisition and transmission of STI.          I am having Lenn Cal. Baquero maintain her Adult Blood Pressure Cuff Lg, DM-Phenylephrine-Acetaminophen, levofloxacin, LORazepam, naproxen, HYDROcodone-acetaminophen, ondansetron, tamsulosin, lisinopril, zidovudine, dolutegravir, and atazanavir-cobicistat.   Meds ordered this encounter  Medications  . zidovudine (RETROVIR) 300 MG tablet  Sig: Take 1 tablet (300 mg total) by mouth 2 (two) times daily.    Dispense:  60 tablet    Refill:  6    Order Specific Question:   Supervising Provider    Answer:   Carlyle Basques [[id number]]  . dolutegravir (TIVICAY) 50 MG tablet    Sig: TAKE 1 TABLET(50 MG) BY MOUTH TWICE DAILY    Dispense:  60 tablet    Refill:  6    Order Specific Question:   Supervising Provider    Answer:   Carlyle Basques [[id number]]  . atazanavir-cobicistat (EVOTAZ) 300-150 MG tablet    Sig: Take 1 tablet by mouth daily. Swallow whole. Do NOT crush, cut or chew tablet. Take with food.    Dispense:  30 tablet    Refill:  6    Order Specific Question:   Supervising Provider    Answer:   Carlyle Basques [phone]    I discussed the assessment and treatment plan with the patient.  The patient was provided an opportunity to ask questions and all were answered. The patient agreed with the plan and demonstrated an understanding of the instructions.   The patient was advised to call back or seek an in-person evaluation if the symptoms worsen or if the condition fails to improve as anticipated.   I provided 14  minutes of non-face-to-face time during this encounter.  Follow-up: Return in about 3 months (around 02/08/2019), or if symptoms worsen or fail to improve.   Terri Piedra, MSN, FNP-C Nurse Practitioner [facility] [place] number: [phone]

## 2018-11-09 NOTE — Assessment & Plan Note (Signed)
   Due for breast cancer screening which is scheduled and pending changes due to coronavirus  Will refer to [facility] if needed at next office visit.   Discussed importance of safe sexual practice to reduce risk of acquisition and transmission of STI.

## 2018-11-09 NOTE — Assessment & Plan Note (Signed)
Veronica Robles has well-controlled HIV disease with good tolerance and decent adherence to her ART regimen.  No symptoms of opportunistic infection or progressive HIV disease at present.  She has no problems obtaining her medication and [facility] is up-to-date through September.  Continue current dose of the Evotaz-Descovy- Retrovir.  Plan for follow-up in 3 months or sooner if needed with lab work following appointment.

## 2019-01-11 ENCOUNTER — Other Ambulatory Visit: Payer: Self-pay

## 2019-01-11 ENCOUNTER — Ambulatory Visit
Admission: RE | Admit: 2019-01-11 | Discharge: 2019-01-11 | Disposition: A | Discharge status: Home / Self Care | Payer: No Typology Code available for payment source | Source: Ambulatory Visit | Attending: Internal Medicine | Admitting: Internal Medicine

## 2019-01-11 DIAGNOSIS — Z1231 Encounter for screening mammogram for malignant neoplasm of breast: Secondary | ICD-10-CM

## 2019-01-31 ENCOUNTER — Telehealth: Payer: Self-pay | Admitting: Internal Medicine

## 2019-01-31 NOTE — Telephone Encounter (Signed)
COVID-19 Pre-Screening Questions:01/31/19 ° ° °Do you currently have a fever (>100 °F), chills or unexplained body aches? NO  ° °Are you currently experiencing new cough, shortness of breath, sore throat, runny nose?NO °•  °Have you recently travelled outside the state of [place] in the last 14 days? NO °•  °Have you been in contact with someone that is currently pending confirmation of Covid19 testing or has been confirmed to have the Covid19 virus?  NO ° °**If the patient answers NO to ALL questions -  advise the patient to please call the clinic before coming to the office should any symptoms develop.  ° ° °

## 2019-02-01 ENCOUNTER — Encounter: Payer: Self-pay | Admitting: Internal Medicine

## 2019-02-01 ENCOUNTER — Ambulatory Visit (INDEPENDENT_AMBULATORY_CARE_PROVIDER_SITE_OTHER): Payer: Self-pay | Admitting: Internal Medicine

## 2019-02-01 ENCOUNTER — Other Ambulatory Visit: Payer: Self-pay

## 2019-02-01 VITALS — BP 171/94 | HR 78 | Temp 98.3°F | Wt 120.0 lb

## 2019-02-01 DIAGNOSIS — I10 Essential (primary) hypertension: Secondary | ICD-10-CM

## 2019-02-01 DIAGNOSIS — Z79899 Other long term (current) drug therapy: Secondary | ICD-10-CM

## 2019-02-01 DIAGNOSIS — B20 Human immunodeficiency virus [HIV] disease: Secondary | ICD-10-CM

## 2019-02-01 MED ORDER — HYDROCHLOROTHIAZIDE 25 MG PO TABS: 25.0000 mg | ORAL_TABLET | Freq: Every day | ORAL | 11 refills | Status: DC

## 2019-02-01 NOTE — Progress Notes (Signed)
Patient ID: Veronica Robles, female   DOB: 04-23-1956, 63 y.o.   MRN: [id number]  HPI Veronica Robles is a 63yo F with well controlled hiv disease, dong well with tivicay-evotaz plus retrovir. She states that she Has developed an ace-Inhibitor medication related cough. The patient is otherwise doing well. She has been diligent with wearing a mask, hand hygiene. Sheltering in place. Unsure when her work is getting back to full capacity   Outpatient Encounter Medications as of 02/01/2019  Medication Sig  . atazanavir-cobicistat (EVOTAZ) 300-150 MG tablet Take 1 tablet by mouth daily. Swallow whole. Do NOT crush, cut or chew tablet. Take with food.  . dolutegravir (TIVICAY) 50 MG tablet TAKE 1 TABLET(50 MG) BY MOUTH TWICE DAILY  . lisinopril (PRINIVIL,ZESTRIL) 20 MG tablet TAKE 1 TABLET BY MOUTH EVERY DAY  . zidovudine (RETROVIR) 300 MG tablet Take 1 tablet (300 mg total) by mouth 2 (two) times daily.  . Blood Pressure Monitoring (ADULT BLOOD PRESSURE CUFF LG) KIT    . DM-Phenylephrine-Acetaminophen 10-5-325 MG/15ML LIQD Take 8 mLs by mouth daily as needed (cough).  Marland Kitchen HYDROcodone-acetaminophen (NORCO) 5-325 MG tablet Take 1 tablet by mouth every 6 (six) hours as needed for severe pain. (Patient not taking: Reported on 02/01/2019)  . levofloxacin (LEVAQUIN) 500 MG tablet Take 1 tablet (500 mg total) by mouth daily. (Patient not taking: Reported on 11/09/2018)  . LORazepam (ATIVAN) 0.5 MG tablet Take 1 tablet (0.5 mg total) by mouth every 6 (six) hours as needed (Nausea). (Patient not taking: Reported on 02/01/2019)  . naproxen (NAPROSYN) 375 MG tablet Take 1 tablet (375 mg total) by mouth 2 (two) times daily as needed for mild pain, moderate pain or headache (TAKE WITH MEALS.). (Patient not taking: Reported on 02/01/2019)  . ondansetron (ZOFRAN ODT) 4 MG disintegrating tablet Take 1 tablet (4 mg total) by mouth every 8 (eight) hours as needed for nausea or vomiting. (Patient not taking: Reported on 02/01/2019)  .  tamsulosin (FLOMAX) 0.4 MG CAPS capsule Take 1 capsule (0.4 mg total) by mouth daily after supper. Take until the stone passes, then stop taking (Patient not taking: Reported on 02/01/2019)   Facility-Administered Encounter Medications as of 02/01/2019  Medication  . ciprofloxacin (CIPRO) IVPB 400 mg     Patient Active Problem List   Diagnosis Date Noted  . Healthcare maintenance 11/09/2018  . 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 HYPERTENSION, BENIGN 01/26/2007  . 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 HIV disease ([place]) 10/26/2006  . HX, PERSONAL, PENICILLIN ALLERGY 10/26/2006  . HX, PERSONAL, DRUG ALLERGY NOS 10/26/2006     Health Maintenance Due  Topic Date Due  . COLONOSCOPY  10/19/2005  . PAP SMEAR-Modifier  01/23/2015    Social History   Tobacco Use  . Smoking status: Former Smoker    Packs/day: 0.10    Types: Cigarettes  . Smokeless tobacco: Never Used  Substance Use Topics  . Alcohol use: No    Alcohol/week: 0.0 standard drinks  . Drug use: No   Review of Systems 12 point ros is negative except for cough Physical Exam   BP (!) 171/94   Pulse 78   Temp 98.3 F (36.8 C) (Oral)   Wt 120 lb (54.4 kg)   BMI 21.95 kg/m   Physical Exam  Constitutional:  oriented to person, place, and time. appears well-developed and well-nourished. No distress.  HENT: [place]/AT, PERRLA, no scleral icterus Mouth/Throat: Oropharynx is clear and moist. No oropharyngeal exudate.  Cardiovascular:  Normal rate, regular rhythm and normal heart sounds. Exam reveals no gallop and no friction rub.  No murmur heard.  Pulmonary/Chest: Effort normal and breath sounds normal. No respiratory distress.  has no wheezes.  Neck = supple, no nuchal rigidity Neurological: alert and oriented to person, place, and time.  Skin: Skin is warm and dry. No rash noted. No erythema.  Psychiatric: a normal mood and affect.  behavior is normal.   Lab Results  Component Value Date    CD4TCELL 21 (L) 05/10/2018   Lab Results  Component Value Date   CD4TABS 410 05/10/2018   CD4TABS 470 12/22/2017   CD4TABS 450 09/29/2017   Lab Results  Component Value Date   HIV1RNAQUANT <20 NOT DETECTED 05/10/2018   Lab Results  Component Value Date   HEPBSAB INDETER (A) 08/08/2009   Lab Results  Component Value Date   LABRPR NON-REACTIVE 05/10/2018    CBC Lab Results  Component Value Date   WBC 6.1 08/28/2018   RBC 4.79 08/28/2018   HGB 13.5 08/28/2018   HCT 42.4 08/28/2018   PLT 226 08/28/2018   MCV 88.5 08/28/2018   MCH 28.2 08/28/2018   MCHC 31.8 08/28/2018   RDW 13.2 08/28/2018   LYMPHSABS 1.7 08/28/2018   MONOABS 0.6 08/28/2018   EOSABS 0.0 08/28/2018    BMET Lab Results  Component Value Date   NA 132 (L) 08/28/2018   K 4.1 08/28/2018   CL 97 (L) 08/28/2018   CO2 26 08/28/2018   GLUCOSE 102 (H) 08/28/2018   BUN 8 08/28/2018   CREATININE 0.82 08/28/2018   CALCIUM 9.2 08/28/2018   GFRNONAA >60 08/28/2018   GFRAA >60 08/28/2018      Assessment and Plan hiv disease = well controlled. Will check labs to see that she is undetectable  Drug side effect = appears to have ace related cough. We will discontinue and  change her to hctz 52m daily  Long term medication management = cr is stable

## 2019-02-02 LAB — T-HELPER CELL (CD4) - (RCID CLINIC ONLY)
CD4 % Helper T Cell: 22 % — ABNORMAL LOW (ref 33–65)
CD4 T Cell Abs: 252 /uL — ABNORMAL LOW (ref 400–1790)

## 2019-02-03 ENCOUNTER — Other Ambulatory Visit: Payer: Self-pay | Admitting: Internal Medicine

## 2019-02-03 DIAGNOSIS — B20 Human immunodeficiency virus [HIV] disease: Secondary | ICD-10-CM

## 2019-02-03 MED ORDER — TIVICAY 50 MG PO TABS: ORAL_TABLET | ORAL | 6 refills | Status: DC

## 2019-02-03 MED ORDER — ZIDOVUDINE 300 MG PO TABS: 300.0000 mg | ORAL_TABLET | Freq: Two times a day (BID) | ORAL | 6 refills | Status: DC

## 2019-02-09 LAB — CBC WITH DIFFERENTIAL/PLATELET
Absolute Monocytes: 391 cells/uL (ref 200–950)
Basophils Absolute: 8 cells/uL (ref 0–200)
Basophils Relative: 0.2 %
Eosinophils Absolute: 50 cells/uL (ref 15–500)
Eosinophils Relative: 1.2 %
HCT: 39.1 % (ref 35.0–45.0)
Hemoglobin: 12.8 g/dL (ref 11.7–15.5)
Lymphs Abs: 1214 cells/uL (ref 850–3900)
MCH: 28.4 pg (ref 27.0–33.0)
MCHC: 32.7 g/dL (ref 32.0–36.0)
MCV: 86.9 fL (ref 80.0–100.0)
MPV: 10.2 fL (ref 7.5–12.5)
Monocytes Relative: 9.3 %
Neutro Abs: 2537 cells/uL (ref 1500–7800)
Neutrophils Relative %: 60.4 %
Platelets: 162 10*3/uL (ref 140–400)
RBC: 4.5 10*6/uL (ref 3.80–5.10)
RDW: 17.5 % — ABNORMAL HIGH (ref 11.0–15.0)
Total Lymphocyte: 28.9 %
WBC: 4.2 10*3/uL (ref 3.8–10.8)

## 2019-02-09 LAB — BASIC METABOLIC PANEL
BUN/Creatinine Ratio: 15 (calc) (ref 6–22)
BUN: 15 mg/dL (ref 7–25)
CO2: 29 mmol/L (ref 20–32)
Calcium: 9.5 mg/dL (ref 8.6–10.4)
Chloride: 105 mmol/L (ref 98–110)
Creat: 1.03 mg/dL — ABNORMAL HIGH (ref 0.50–0.99)
Glucose, Bld: 85 mg/dL (ref 65–99)
Potassium: 4.1 mmol/L (ref 3.5–5.3)
Sodium: 140 mmol/L (ref 135–146)

## 2019-02-09 LAB — HIV-1 RNA QUANT-NO REFLEX-BLD
HIV 1 RNA Quant: <20 copies/mL — AB
HIV-1 RNA Quant, Log: <1.3 Log copies/mL — AB

## 2019-03-03 ENCOUNTER — Encounter: Payer: Self-pay | Admitting: Internal Medicine

## 2019-04-05 ENCOUNTER — Ambulatory Visit (INDEPENDENT_AMBULATORY_CARE_PROVIDER_SITE_OTHER): Payer: Self-pay | Admitting: Internal Medicine

## 2019-04-05 ENCOUNTER — Encounter: Payer: Self-pay | Admitting: Internal Medicine

## 2019-04-05 ENCOUNTER — Other Ambulatory Visit: Payer: Self-pay

## 2019-04-05 VITALS — BP 165/90 | HR 131 | Temp 98.5°F

## 2019-04-05 DIAGNOSIS — Z23 Encounter for immunization: Secondary | ICD-10-CM

## 2019-04-05 DIAGNOSIS — B20 Human immunodeficiency virus [HIV] disease: Secondary | ICD-10-CM

## 2019-04-05 DIAGNOSIS — Z79899 Other long term (current) drug therapy: Secondary | ICD-10-CM

## 2019-04-05 NOTE — Progress Notes (Signed)
Patient ID: Veronica Robles, female   DOB: 1956/01/07, 63 y.o.   MRN: [id number]  HPI 63yo f with hx of hiv disease, htn, cd4 ct 252/VL<20 in July currently on tivicay-evotaz-zidovudine. Continue to have good adherence. Has been in good health stays home. Needing to get job. Her son is supporting her.  Outpatient Encounter Medications as of 04/05/2019  Medication Sig  . Blood Pressure Monitoring (ADULT BLOOD PRESSURE CUFF LG) KIT    . DM-Phenylephrine-Acetaminophen 10-5-325 MG/15ML LIQD Take 8 mLs by mouth daily as needed (cough).  . dolutegravir (TIVICAY) 50 MG tablet TAKE 1 TABLET(50 MG) BY MOUTH TWICE DAILY  . EVOTAZ 300-150 MG tablet TAKE 1 TABLET BY MOUTH DAILY. SWALLOW WHOLE. DO NOT CRUSH, CUT OR CHEW TABLET.TK WITH FOOD  . hydrochlorothiazide (HYDRODIURIL) 25 MG tablet Take 1 tablet (25 mg total) by mouth daily.  . zidovudine (RETROVIR) 300 MG tablet Take 1 tablet (300 mg total) by mouth 2 (two) times daily.   Facility-Administered Encounter Medications as of 04/05/2019  Medication  . ciprofloxacin (CIPRO) IVPB 400 mg     Patient Active Problem List   Diagnosis Date Noted  . Healthcare maintenance 11/09/2018  . 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 HYPERTENSION, BENIGN 01/26/2007  . 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 HIV disease ([place]) 10/26/2006  . HX, PERSONAL, PENICILLIN ALLERGY 10/26/2006  . HX, PERSONAL, DRUG ALLERGY NOS 10/26/2006     Health Maintenance Due  Topic Date Due  . COLONOSCOPY  10/19/2005  . PAP SMEAR-Modifier  01/23/2015  . INFLUENZA VACCINE  02/26/2019     Review of Systems Review of Systems  Constitutional: Negative for fever, chills, diaphoresis, activity change, appetite change, fatigue and unexpected weight change.  HENT: Negative for congestion, sore throat, rhinorrhea, sneezing, trouble swallowing and sinus pressure.  Eyes: Negative for photophobia and visual disturbance.  Respiratory: Negative for cough,  chest tightness, shortness of breath, wheezing and stridor.  Cardiovascular: Negative for chest pain, palpitations and leg swelling.  Gastrointestinal: Negative for nausea, vomiting, abdominal pain, diarrhea, constipation, blood in stool, abdominal distention and anal bleeding.  Genitourinary: Negative for dysuria, hematuria, flank pain and difficulty urinating.  Musculoskeletal: Negative for myalgias, back pain, joint swelling, arthralgias and gait problem.  Skin: Negative for color change, pallor, rash and wound.  Neurological: Negative for dizziness, tremors, weakness and light-headedness.  Hematological: Negative for adenopathy. Does not bruise/bleed easily.  Psychiatric/Behavioral: Negative for behavioral problems, confusion, sleep disturbance, dysphoric mood, decreased concentration and agitation.    Physical Exam  BP (!) 165/90   Pulse (!) 131   Temp 98.5 F (36.9 C)  Physical Exam  Constitutional:  oriented to person, place, and time. appears well-developed and well-nourished. No distress.  HENT: [place]/AT, PERRLA, no scleral icterus Mouth/Throat: Oropharynx is clear and moist. No oropharyngeal exudate.  Cardiovascular: Normal rate, regular rhythm and normal heart sounds. Exam reveals no gallop and no friction rub.  No murmur heard.  Pulmonary/Chest: Effort normal and breath sounds normal. No respiratory distress.  has no wheezes.  Neck = supple, no nuchal rigidity Lymphadenopathy: no cervical adenopathy. No axillary adenopathy Neurological: alert and oriented to person, place, and time.  Skin: Skin is warm and dry. No rash noted. No erythema.  Psychiatric: a normal mood and affect.  behavior is normal.   Lab Results  Component Value Date   CD4TCELL 22 (L) 02/01/2019   Lab Results  Component Value Date   CD4TABS 252 (L) 02/01/2019   CD4TABS 410 05/10/2018  CD4TABS 470 12/22/2017   Lab Results  Component Value Date   HIV1RNAQUANT <20 DETECTED (A) 02/01/2019   Lab  Results  Component Value Date   HEPBSAB INDETER (A) 08/08/2009   Lab Results  Component Value Date   LABRPR NON-REACTIVE 05/10/2018    CBC Lab Results  Component Value Date   WBC 4.2 02/01/2019   RBC 4.50 02/01/2019   HGB 12.8 02/01/2019   HCT 39.1 02/01/2019   PLT 162 02/01/2019   MCV 86.9 02/01/2019   MCH 28.4 02/01/2019   MCHC 32.7 02/01/2019   RDW 17.5 (H) 02/01/2019   LYMPHSABS 1,214 02/01/2019   MONOABS 0.6 08/28/2018   EOSABS 50 02/01/2019    BMET Lab Results  Component Value Date   NA 140 02/01/2019   K 4.1 02/01/2019   CL 105 02/01/2019   CO2 29 02/01/2019   GLUCOSE 85 02/01/2019   BUN 15 02/01/2019   CREATININE 1.03 (H) 02/01/2019   CALCIUM 9.5 02/01/2019   GFRNONAA >60 08/28/2018   GFRAA >60 08/28/2018      Assessment and Plan   hiv disease = continue on current regimen  Health maintenance = will give flu shot  Long term medication management = cr is stable. Will check labs

## 2019-06-21 ENCOUNTER — Other Ambulatory Visit: Payer: Self-pay

## 2019-06-21 DIAGNOSIS — B20 Human immunodeficiency virus [HIV] disease: Secondary | ICD-10-CM

## 2019-06-22 LAB — T-HELPER CELL (CD4) - (RCID CLINIC ONLY)
CD4 % Helper T Cell: 20 % — ABNORMAL LOW (ref 33–65)
CD4 T Cell Abs: 231 /uL — ABNORMAL LOW (ref 400–1790)

## 2019-06-27 LAB — COMPLETE METABOLIC PANEL WITH GFR
AG Ratio: 1.5 (calc) (ref 1.0–2.5)
ALT: 18 U/L (ref 6–29)
AST: 25 U/L (ref 10–35)
Albumin: 4.2 g/dL (ref 3.6–5.1)
Alkaline phosphatase (APISO): 69 U/L (ref 37–153)
BUN/Creatinine Ratio: 12 (calc) (ref 6–22)
BUN: 12 mg/dL (ref 7–25)
CO2: 33 mmol/L — ABNORMAL HIGH (ref 20–32)
Calcium: 9.5 mg/dL (ref 8.6–10.4)
Chloride: 103 mmol/L (ref 98–110)
Creat: 1.02 mg/dL — ABNORMAL HIGH (ref 0.50–0.99)
GFR, Est African American: 68 mL/min/{1.73_m2} (ref >=60)
GFR, Est Non African American: 58 mL/min/{1.73_m2} — ABNORMAL LOW (ref >=60)
Globulin: 2.8 g/dL (calc) (ref 1.9–3.7)
Glucose, Bld: 94 mg/dL (ref 65–99)
Potassium: 3.7 mmol/L (ref 3.5–5.3)
Sodium: 141 mmol/L (ref 135–146)
Total Bilirubin: 0.4 mg/dL (ref 0.2–1.2)
Total Protein: 7 g/dL (ref 6.1–8.1)

## 2019-06-27 LAB — CBC WITH DIFFERENTIAL/PLATELET
Absolute Monocytes: 547 cells/uL (ref 200–950)
Basophils Absolute: 18 cells/uL (ref 0–200)
Basophils Relative: 0.4 %
Eosinophils Absolute: 41 cells/uL (ref 15–500)
Eosinophils Relative: 0.9 %
HCT: 38.9 % (ref 35.0–45.0)
Hemoglobin: 13.1 g/dL (ref 11.7–15.5)
Lymphs Abs: 1168 cells/uL (ref 850–3900)
MCH: 29.8 pg (ref 27.0–33.0)
MCHC: 33.7 g/dL (ref 32.0–36.0)
MCV: 88.4 fL (ref 80.0–100.0)
MPV: 10.2 fL (ref 7.5–12.5)
Monocytes Relative: 11.9 %
Neutro Abs: 2824 cells/uL (ref 1500–7800)
Neutrophils Relative %: 61.4 %
Platelets: 160 10*3/uL (ref 140–400)
RBC: 4.4 10*6/uL (ref 3.80–5.10)
RDW: 13.8 % (ref 11.0–15.0)
Total Lymphocyte: 25.4 %
WBC: 4.6 10*3/uL (ref 3.8–10.8)

## 2019-06-27 LAB — HIV-1 RNA QUANT-NO REFLEX-BLD
HIV 1 RNA Quant: 4580 copies/mL — ABNORMAL HIGH
HIV-1 RNA Quant, Log: 3.66 Log copies/mL — ABNORMAL HIGH

## 2019-07-01 ENCOUNTER — Telehealth: Payer: Self-pay

## 2019-07-01 NOTE — Telephone Encounter (Signed)
COVID-19 Pre-Screening Questions:  Do you currently have a fever (>100 F), chills or unexplained body aches? NO   Are you currently experiencing new cough, shortness of breath, sore throat, runny nose?NO .  Have you recently travelled outside the state of [place] in the last 14 days? NO .  Have you been in contact with someone that is currently pending confirmation of Covid19 testing or has been confirmed to have the Covid19 virus?  NO  **If the patient answers NO to ALL questions -  advise the patient to please call the clinic before coming to the office should any symptoms develop.     

## 2019-07-04 ENCOUNTER — Ambulatory Visit (INDEPENDENT_AMBULATORY_CARE_PROVIDER_SITE_OTHER): Payer: Self-pay | Admitting: Internal Medicine

## 2019-07-04 ENCOUNTER — Other Ambulatory Visit: Payer: Self-pay

## 2019-07-04 ENCOUNTER — Encounter: Payer: Self-pay | Admitting: Internal Medicine

## 2019-07-04 VITALS — BP 156/83 | HR 71 | Temp 98.2°F | Ht 62.0 in | Wt 128.0 lb

## 2019-07-04 DIAGNOSIS — Z789 Other specified health status: Secondary | ICD-10-CM

## 2019-07-04 DIAGNOSIS — Z79899 Other long term (current) drug therapy: Secondary | ICD-10-CM

## 2019-07-04 DIAGNOSIS — B20 Human immunodeficiency virus [HIV] disease: Secondary | ICD-10-CM

## 2019-07-04 NOTE — Progress Notes (Signed)
RFV: follow up for hiv disease  Patient ID: Veronica Robles, female   DOB: July 30, 1955, 63 y.o.   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 She takes tivicay-evotaz-ZDV. However, this past week, she missed 2 doses. Doesn't take meds when she goes to her son's house  Outpatient Encounter Medications as of 07/04/2019  Medication Sig  . Blood Pressure Monitoring (ADULT BLOOD PRESSURE CUFF LG) KIT    . DM-Phenylephrine-Acetaminophen 10-5-325 MG/15ML LIQD Take 8 mLs by mouth daily as needed (cough).  . dolutegravir (TIVICAY) 50 MG tablet TAKE 1 TABLET(50 MG) BY MOUTH TWICE DAILY  . EVOTAZ 300-150 MG tablet TAKE 1 TABLET BY MOUTH DAILY. SWALLOW WHOLE. DO NOT CRUSH, CUT OR CHEW TABLET.TK WITH FOOD  . hydrochlorothiazide (HYDRODIURIL) 25 MG tablet Take 1 tablet (25 mg total) by mouth daily.  . zidovudine (RETROVIR) 300 MG tablet Take 1 tablet (300 mg total) by mouth 2 (two) times daily.   Facility-Administered Encounter Medications as of 07/04/2019  Medication  . ciprofloxacin (CIPRO) IVPB 400 mg     Patient Active Problem List   Diagnosis Date Noted  . Healthcare maintenance 11/09/2018  . 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 HYPERTENSION, BENIGN 01/26/2007  . 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 HIV disease ([place]) 10/26/2006  . HX, PERSONAL, PENICILLIN ALLERGY 10/26/2006  . HX, PERSONAL, DRUG ALLERGY NOS 10/26/2006     Health Maintenance Due  Topic Date Due  . COLONOSCOPY  10/19/2005  . PAP SMEAR-Modifier  01/23/2015    Social History   Tobacco Use  . Smoking status: Former Smoker    Packs/day: 0.10    Types: Cigarettes  . Smokeless tobacco: Never Used  Substance Use Topics  . Alcohol use: No   Alcohol/week: 0.0 standard drinks  . Drug use: No   Review of Systems Review of Systems  Constitutional: Negative for fever, chills, diaphoresis, activity change, appetite change, fatigue and unexpected weight change.  HENT: Negative for congestion, sore throat, rhinorrhea, sneezing, trouble swallowing and sinus pressure.  Eyes: Negative for photophobia and visual disturbance.  Respiratory: Negative for cough, chest tightness, shortness of breath, wheezing and stridor.  Cardiovascular: Negative for chest pain, palpitations and leg swelling.  Gastrointestinal: Negative for nausea, vomiting, abdominal pain, diarrhea, constipation, blood in stool, abdominal distention and anal bleeding.  Genitourinary: Negative for dysuria, hematuria, flank pain and difficulty urinating.  Musculoskeletal: Negative for myalgias, back pain, joint swelling, arthralgias and gait problem.  Skin: Negative for color change, pallor, rash and wound.  Neurological: Negative for dizziness, tremors, weakness and light-headedness.  Hematological: Negative for adenopathy. Does not bruise/bleed easily.  Psychiatric/Behavioral: Negative for behavioral problems, confusion, sleep disturbance, dysphoric mood, decreased concentration and agitation.    Physical Exam   Ht '5\' 2"'  (1.575 m)   Wt 128 lb (58.1 kg)   BMI 23.41 kg/m   Physical Exam  Constitutional:  oriented to person, place, and time. appears well-developed and well-nourished. No distress.  HENT: [place]/AT, PERRLA, no scleral icterus Mouth/Throat: Oropharynx is clear and moist. No oropharyngeal exudate.  Cardiovascular: Normal rate, regular rhythm and normal heart sounds. Exam reveals no gallop and no friction rub.  No murmur heard.  Pulmonary/Chest: Effort normal and breath sounds normal. No respiratory distress.  has no wheezes.  Neck = supple, no nuchal rigidity Lymphadenopathy: no cervical adenopathy. No axillary adenopathy Neurological: alert and oriented to  person, place, and time.  Skin: Skin is warm and dry. No rash noted. No erythema.  Psychiatric: a normal mood and affect.  behavior is normal.   Lab Results  Component Value Date   CD4TCELL 20 (L) 06/21/2019   Lab Results  Component Value Date   CD4TABS 231 (L) 06/21/2019   CD4TABS 252 (L) 02/01/2019   CD4TABS 410 05/10/2018   Lab Results  Component Value Date   HIV1RNAQUANT 4,580 (H) 06/21/2019   Lab Results  Component Value Date   HEPBSAB INDETER (A) 08/08/2009   Lab Results  Component Value Date   LABRPR NON-REACTIVE 05/10/2018    CBC Lab Results  Component Value Date   WBC 4.6 06/21/2019   RBC 4.40 06/21/2019   HGB 13.1 06/21/2019   HCT 38.9 06/21/2019   PLT 160 06/21/2019   MCV 88.4 06/21/2019   MCH 29.8 06/21/2019   MCHC 33.7 06/21/2019   RDW 13.8 06/21/2019   LYMPHSABS 1,168 06/21/2019   MONOABS 0.6 08/28/2018   EOSABS 41 06/21/2019    BMET Lab Results  Component Value Date   NA 141 06/21/2019   K 3.7 06/21/2019   CL 103 06/21/2019   CO2 33 (H) 06/21/2019   GLUCOSE 94 06/21/2019   BUN 12 06/21/2019   CREATININE 1.02 (H) 06/21/2019   CALCIUM 9.5 06/21/2019   GFRNONAA 58 (L) 06/21/2019   GFRAA 68 06/21/2019      Assessment and Plan  hiv diseae= poorly controlled. Concern that she is not sufficiently adherent and can have further mutations. We will check viral load and genotype  Adherence counseling = spent 5 min discussing how to improve  Long term med management = cr stable

## 2019-07-09 LAB — HIV RNA, RTPCR W/R GT (RTI, PI,INT)
HIV 1 RNA Quant: 191 copies/mL — ABNORMAL HIGH
HIV-1 RNA Quant, Log: 2.28 Log copies/mL — ABNORMAL HIGH

## 2019-09-07 ENCOUNTER — Encounter: Payer: Self-pay | Admitting: Internal Medicine

## 2020-01-04 ENCOUNTER — Other Ambulatory Visit: Payer: Self-pay

## 2020-01-04 ENCOUNTER — Ambulatory Visit (INDEPENDENT_AMBULATORY_CARE_PROVIDER_SITE_OTHER): Payer: Self-pay | Admitting: Internal Medicine

## 2020-01-04 VITALS — BP 159/85 | HR 82 | Temp 98.2°F | Wt 132.0 lb

## 2020-01-04 DIAGNOSIS — Z79899 Other long term (current) drug therapy: Secondary | ICD-10-CM

## 2020-01-04 DIAGNOSIS — B20 Human immunodeficiency virus [HIV] disease: Secondary | ICD-10-CM

## 2020-01-04 DIAGNOSIS — I10 Essential (primary) hypertension: Secondary | ICD-10-CM

## 2020-01-04 NOTE — Progress Notes (Signed)
RFV: follow up for hiv disease  Patient ID: Veronica Robles, female   DOB: [birth year], 64 y.o.   MRN: [id number]  HPI  Veronica Robles is a 64yo F with well controlled hiv disease on tivicay,evotaz-azt.  Worried about her son who had vascular surgery- to repair rupture aortic aneurysm.   She is back to work, and jump and fun. She is vaccinated doing well.  Outpatient Encounter Medications as of 01/04/2020  Medication Sig   Blood Pressure Monitoring (ADULT BLOOD PRESSURE CUFF LG) KIT     dolutegravir (TIVICAY) 50 MG tablet TAKE 1 TABLET(50 MG) BY MOUTH TWICE DAILY   EVOTAZ 300-150 MG tablet TAKE 1 TABLET BY MOUTH DAILY. SWALLOW WHOLE. DO NOT CRUSH, CUT OR CHEW TABLET.TK WITH FOOD   hydrochlorothiazide (HYDRODIURIL) 25 MG tablet Take 1 tablet (25 mg total) by mouth daily.   zidovudine (RETROVIR) 300 MG tablet Take 1 tablet (300 mg total) by mouth 2 (two) times daily.   DM-Phenylephrine-Acetaminophen 10-5-325 MG/15ML LIQD Take 8 mLs by mouth daily as needed (cough).   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 She was dx in 10/02/1991.  Review of Systems Review of Systems  Constitutional: Negative for fever, chills, diaphoresis, activity change, appetite change,  fatigue and unexpected weight change.  HENT: Negative for congestion, sore throat, rhinorrhea, sneezing, trouble swallowing and sinus pressure.  Eyes: Negative for photophobia and visual disturbance.  Respiratory: Negative for cough, chest tightness, shortness of breath, wheezing and stridor.  Cardiovascular: Negative for chest pain, palpitations and leg swelling.  Gastrointestinal: Negative for nausea, vomiting, abdominal pain, diarrhea, constipation, blood in stool, abdominal distention and anal bleeding.  Genitourinary: Negative for dysuria, hematuria, flank pain and difficulty urinating.  Musculoskeletal: Negative for myalgias, back pain, joint swelling, arthralgias and gait problem.  Skin: Negative for color change, pallor, rash and wound.  Neurological: Negative for dizziness, tremors, weakness and light-headedness.  Hematological: Negative for adenopathy. Does not bruise/bleed easily.  Psychiatric/Behavioral: Negative for behavioral problems, confusion, sleep disturbance, dysphoric mood, decreased concentration and agitation.    Physical Exam   BP (!) 159/85    Pulse 82    Temp 98.2 F (36.8 C)    Wt 132 lb (59.9 kg)    SpO2 100%    BMI 24.14 kg/m   Physical Exam  Constitutional:  oriented to person, place, and time. appears well-developed and well-nourished. No distress.  HENT: [place]/AT, PERRLA, no scleral icterus Mouth/Throat: Oropharynx is clear and moist. No oropharyngeal exudate.  Cardiovascular: Normal rate, regular rhythm and normal heart sounds. Exam reveals no gallop and no friction rub.  No murmur heard.  Pulmonary/Chest: Effort normal and breath sounds normal. No respiratory distress.  has no wheezes.  Neck = supple, no nuchal rigidity Abdominal:  Soft. Bowel sounds are normal.  exhibits no distension. There is no tenderness.  Lymphadenopathy: no cervical adenopathy. No axillary adenopathy Neurological: alert and oriented to person, place, and time.  Skin: Skin is warm and  dry. No rash noted. No erythema.  Psychiatric: a normal mood and affect.  behavior is normal.   Lab Results  Component Value Date   CD4TCELL 20 (L) 06/21/2019   Lab Results  Component Value Date   CD4TABS 231 (L) 06/21/2019   CD4TABS 252 (L) 02/01/2019   CD4TABS 410 05/10/2018   Lab Results  Component Value Date   HIV1RNAQUANT 191 (H) 07/04/2019   Lab Results  Component Value Date   HEPBSAB INDETER (A) 08/08/2009   Lab Results  Component Value Date   LABRPR NON-REACTIVE 05/10/2018    CBC Lab Results  Component Value Date   WBC 4.6 06/21/2019   RBC 4.40 06/21/2019   HGB 13.1 06/21/2019   HCT 38.9 06/21/2019   PLT 160 06/21/2019   MCV 88.4 06/21/2019   MCH 29.8 06/21/2019   MCHC 33.7 06/21/2019   RDW 13.8 06/21/2019   LYMPHSABS 1,168 06/21/2019   MONOABS 0.6 08/28/2018   EOSABS 41 06/21/2019    BMET Lab Results  Component Value Date   NA 141 06/21/2019   K 3.7 06/21/2019   CL 103 06/21/2019   CO2 33 (H) 06/21/2019   GLUCOSE 94 06/21/2019   BUN 12 06/21/2019   CREATININE 1.02 (H) 06/21/2019   CALCIUM 9.5 06/21/2019   GFRNONAA 58 (L) 06/21/2019   GFRAA 68 06/21/2019      Assessment and Plan hiv disease = recommend the excellent job at taking her regimen. Will get labs today to see that she is virologically controlled Need to do 6 month labs  htn = she often has white coat syndrome. Currently on hctz 67m daily. Will ask her to take BP at home to see if its elevated  Long term medication management = cr is stable Health maintenance = Mammogram is due next month

## 2020-01-05 LAB — COMPLETE METABOLIC PANEL WITH GFR
AG Ratio: 1.6 (calc) (ref 1.0–2.5)
ALT: 13 U/L (ref 6–29)
AST: 20 U/L (ref 10–35)
Albumin: 4.4 g/dL (ref 3.6–5.1)
Alkaline phosphatase (APISO): 76 U/L (ref 37–153)
BUN/Creatinine Ratio: 9 (calc) (ref 6–22)
BUN: 9 mg/dL (ref 7–25)
CO2: 30 mmol/L (ref 20–32)
Calcium: 9.6 mg/dL (ref 8.6–10.4)
Chloride: 102 mmol/L (ref 98–110)
Creat: 1 mg/dL — ABNORMAL HIGH (ref 0.50–0.99)
GFR, Est African American: 69 mL/min/{1.73_m2} (ref >=60)
GFR, Est Non African American: 59 mL/min/{1.73_m2} — ABNORMAL LOW (ref >=60)
Globulin: 2.8 g/dL (calc) (ref 1.9–3.7)
Glucose, Bld: 85 mg/dL (ref 65–99)
Potassium: 4 mmol/L (ref 3.5–5.3)
Sodium: 141 mmol/L (ref 135–146)
Total Bilirubin: 0.5 mg/dL (ref 0.2–1.2)
Total Protein: 7.2 g/dL (ref 6.1–8.1)

## 2020-01-05 LAB — CBC WITH DIFFERENTIAL/PLATELET
Absolute Monocytes: 607 cells/uL (ref 200–950)
Basophils Absolute: 20 cells/uL (ref 0–200)
Basophils Relative: 0.4 %
Eosinophils Absolute: 61 cells/uL (ref 15–500)
Eosinophils Relative: 1.2 %
HCT: 39.5 % (ref 35.0–45.0)
Hemoglobin: 13.2 g/dL (ref 11.7–15.5)
Lymphs Abs: 1346 cells/uL (ref 850–3900)
MCH: 30.8 pg (ref 27.0–33.0)
MCHC: 33.4 g/dL (ref 32.0–36.0)
MCV: 92.3 fL (ref 80.0–100.0)
MPV: 10.2 fL (ref 7.5–12.5)
Monocytes Relative: 11.9 %
Neutro Abs: 3065 cells/uL (ref 1500–7800)
Neutrophils Relative %: 60.1 %
Platelets: 177 10*3/uL (ref 140–400)
RBC: 4.28 10*6/uL (ref 3.80–5.10)
RDW: 14.3 % (ref 11.0–15.0)
Total Lymphocyte: 26.4 %
WBC: 5.1 10*3/uL (ref 3.8–10.8)

## 2020-01-05 LAB — LIPID PANEL
Cholesterol: 203 mg/dL — ABNORMAL HIGH (ref <200)
HDL: 51 mg/dL (ref >=50)
LDL Cholesterol (Calc): 127 mg/dL (calc) — ABNORMAL HIGH
Non-HDL Cholesterol (Calc): 152 mg/dL (calc) — ABNORMAL HIGH (ref <130)
Total CHOL/HDL Ratio: 4 (calc) (ref <5.0)
Triglycerides: 133 mg/dL (ref <150)

## 2020-01-05 LAB — T-HELPER CELL (CD4) - (RCID CLINIC ONLY)
CD4 % Helper T Cell: 22 % — ABNORMAL LOW (ref 33–65)
CD4 T Cell Abs: 250 /uL — ABNORMAL LOW (ref 400–1790)

## 2020-01-05 LAB — HIV-1 RNA QUANT-NO REFLEX-BLD
HIV 1 RNA Quant: <20 copies/mL
HIV-1 RNA Quant, Log: <1.3 Log copies/mL

## 2020-01-06 ENCOUNTER — Other Ambulatory Visit: Payer: Self-pay | Admitting: *Deleted

## 2020-01-06 DIAGNOSIS — Z1231 Encounter for screening mammogram for malignant neoplasm of breast: Secondary | ICD-10-CM

## 2020-01-19 ENCOUNTER — Ambulatory Visit
Admission: RE | Admit: 2020-01-19 | Discharge: 2020-01-19 | Disposition: A | Discharge status: Home / Self Care | Payer: No Typology Code available for payment source | Source: Ambulatory Visit | Attending: Internal Medicine | Admitting: Internal Medicine

## 2020-01-19 ENCOUNTER — Other Ambulatory Visit: Payer: Self-pay

## 2020-01-19 DIAGNOSIS — Z1231 Encounter for screening mammogram for malignant neoplasm of breast: Secondary | ICD-10-CM

## 2020-02-04 ENCOUNTER — Other Ambulatory Visit: Payer: Self-pay | Admitting: Internal Medicine

## 2020-02-04 DIAGNOSIS — B20 Human immunodeficiency virus [HIV] disease: Secondary | ICD-10-CM

## 2020-02-06 ENCOUNTER — Other Ambulatory Visit: Payer: Self-pay

## 2020-02-06 DIAGNOSIS — B20 Human immunodeficiency virus [HIV] disease: Secondary | ICD-10-CM

## 2020-02-06 MED ORDER — TIVICAY 50 MG PO TABS: ORAL_TABLET | ORAL | 6 refills | Status: DC

## 2020-02-06 MED ORDER — ZIDOVUDINE 300 MG PO TABS: 300.0000 mg | ORAL_TABLET | Freq: Two times a day (BID) | ORAL | 6 refills | Status: DC

## 2020-02-06 MED ORDER — HYDROCHLOROTHIAZIDE 25 MG PO TABS: 25.0000 mg | ORAL_TABLET | Freq: Every day | ORAL | 6 refills | Status: DC

## 2020-02-06 MED ORDER — EVOTAZ 300-150 MG PO TABS: ORAL_TABLET | ORAL | 6 refills | Status: DC

## 2020-02-07 ENCOUNTER — Ambulatory Visit: Payer: Self-pay

## 2020-02-07 ENCOUNTER — Other Ambulatory Visit: Payer: Self-pay

## 2020-02-14 ENCOUNTER — Encounter: Payer: Self-pay | Admitting: Internal Medicine

## 2020-03-03 ENCOUNTER — Other Ambulatory Visit: Payer: Self-pay | Admitting: Internal Medicine

## 2020-03-03 DIAGNOSIS — B20 Human immunodeficiency virus [HIV] disease: Secondary | ICD-10-CM

## 2020-03-04 ENCOUNTER — Other Ambulatory Visit: Payer: Self-pay | Admitting: Internal Medicine

## 2020-03-04 DIAGNOSIS — B20 Human immunodeficiency virus [HIV] disease: Secondary | ICD-10-CM

## 2020-04-30 ENCOUNTER — Other Ambulatory Visit: Payer: Self-pay

## 2020-04-30 DIAGNOSIS — B20 Human immunodeficiency virus [HIV] disease: Secondary | ICD-10-CM

## 2020-04-30 DIAGNOSIS — Z113 Encounter for screening for infections with a predominantly sexual mode of transmission: Secondary | ICD-10-CM

## 2020-04-30 NOTE — Addendum Note (Signed)
Addended by: Consuelo Pandy on: 04/30/2020 08:58 AM   Modules accepted: Orders

## 2020-05-01 ENCOUNTER — Other Ambulatory Visit: Payer: Self-pay

## 2020-05-01 DIAGNOSIS — B20 Human immunodeficiency virus [HIV] disease: Secondary | ICD-10-CM

## 2020-05-01 DIAGNOSIS — Z113 Encounter for screening for infections with a predominantly sexual mode of transmission: Secondary | ICD-10-CM

## 2020-05-02 LAB — URINE CYTOLOGY ANCILLARY ONLY
Chlamydia: NEGATIVE
Comment: NEGATIVE
Comment: NORMAL
Neisseria Gonorrhea: NEGATIVE

## 2020-05-02 LAB — T-HELPER CELL (CD4) - (RCID CLINIC ONLY)
CD4 % Helper T Cell: 23 % — ABNORMAL LOW (ref 33–65)
CD4 T Cell Abs: 360 /uL — ABNORMAL LOW (ref 400–1790)

## 2020-05-03 LAB — CBC WITH DIFFERENTIAL/PLATELET
Absolute Monocytes: 571 cells/uL (ref 200–950)
Basophils Absolute: 10 cells/uL (ref 0–200)
Basophils Relative: 0.2 %
Eosinophils Absolute: 91 cells/uL (ref 15–500)
Eosinophils Relative: 1.9 %
HCT: 37.7 % (ref 35.0–45.0)
Hemoglobin: 12.7 g/dL (ref 11.7–15.5)
Lymphs Abs: 1522 cells/uL (ref 850–3900)
MCH: 31.1 pg (ref 27.0–33.0)
MCHC: 33.7 g/dL (ref 32.0–36.0)
MCV: 92.2 fL (ref 80.0–100.0)
MPV: 10.3 fL (ref 7.5–12.5)
Monocytes Relative: 11.9 %
Neutro Abs: 2606 cells/uL (ref 1500–7800)
Neutrophils Relative %: 54.3 %
Platelets: 172 10*3/uL (ref 140–400)
RBC: 4.09 10*6/uL (ref 3.80–5.10)
RDW: 14.1 % (ref 11.0–15.0)
Total Lymphocyte: 31.7 %
WBC: 4.8 10*3/uL (ref 3.8–10.8)

## 2020-05-03 LAB — COMPLETE METABOLIC PANEL WITH GFR
AG Ratio: 1.6 (calc) (ref 1.0–2.5)
ALT: 13 U/L (ref 6–29)
AST: 22 U/L (ref 10–35)
Albumin: 4.2 g/dL (ref 3.6–5.1)
Alkaline phosphatase (APISO): 71 U/L (ref 37–153)
BUN/Creatinine Ratio: 10 (calc) (ref 6–22)
BUN: 11 mg/dL (ref 7–25)
CO2: 29 mmol/L (ref 20–32)
Calcium: 9.5 mg/dL (ref 8.6–10.4)
Chloride: 102 mmol/L (ref 98–110)
Creat: 1.06 mg/dL — ABNORMAL HIGH (ref 0.50–0.99)
GFR, Est African American: 64 mL/min/{1.73_m2} (ref >=60)
GFR, Est Non African American: 55 mL/min/{1.73_m2} — ABNORMAL LOW (ref >=60)
Globulin: 2.6 g/dL (calc) (ref 1.9–3.7)
Glucose, Bld: 92 mg/dL (ref 65–99)
Potassium: 3.5 mmol/L (ref 3.5–5.3)
Sodium: 140 mmol/L (ref 135–146)
Total Bilirubin: 0.7 mg/dL (ref 0.2–1.2)
Total Protein: 6.8 g/dL (ref 6.1–8.1)

## 2020-05-03 LAB — HIV-1 RNA QUANT-NO REFLEX-BLD
HIV 1 RNA Quant: <20 Copies/mL — ABNORMAL HIGH
HIV-1 RNA Quant, Log: <1.3 Log cps/mL — ABNORMAL HIGH

## 2020-05-03 LAB — RPR: RPR Ser Ql: NONREACTIVE

## 2020-05-17 ENCOUNTER — Encounter: Payer: Self-pay | Admitting: Internal Medicine

## 2020-05-17 ENCOUNTER — Ambulatory Visit (INDEPENDENT_AMBULATORY_CARE_PROVIDER_SITE_OTHER): Payer: Self-pay | Admitting: Internal Medicine

## 2020-05-17 ENCOUNTER — Other Ambulatory Visit: Payer: Self-pay

## 2020-05-17 VITALS — BP 150/96 | HR 78 | Temp 98.9°F | Ht 62.0 in | Wt 132.0 lb

## 2020-05-17 DIAGNOSIS — Z79899 Other long term (current) drug therapy: Secondary | ICD-10-CM

## 2020-05-17 DIAGNOSIS — I10 Essential (primary) hypertension: Secondary | ICD-10-CM

## 2020-05-17 DIAGNOSIS — B20 Human immunodeficiency virus [HIV] disease: Secondary | ICD-10-CM

## 2020-05-17 NOTE — Progress Notes (Signed)
RFV: follow for hiv disease  Patient ID: Veronica Robles, female   DOB: March 01, 1956, 64 y.o.   MRN: [id number]  HPI 64yo F with HIV disease, well-controlled evotaz, and tivicay and AZT. She reports taking consistently. She has received her  Flu shot on 05/08/20 and tolerated without difficutly. She brings with her original diagnosis letter 04/18/1992 through anonymous testing that she would like to be scanned in her records. She is still very private to her hiv diagnosis and doesn't want anyone to find among her things. She also reports that the person who is power of attorney, her sister now on HD.  Overall in good health.  Sochx: no smoking and alcohol use.  Outpatient Encounter Medications as of 05/17/2020  Medication Sig  . atazanavir-cobicistat (EVOTAZ) 300-150 MG tablet TAKE 1 TABLET BY MOUTH DAILY. SWALLOW WHOLE. DO NOT CRUSH, CUT OR CHEW TABLET.TK WITH FOOD  . Blood Pressure Monitoring (ADULT BLOOD PRESSURE CUFF LG) KIT    . DM-Phenylephrine-Acetaminophen 10-5-325 MG/15ML LIQD Take 8 mLs by mouth daily as needed (cough).  . dolutegravir (TIVICAY) 50 MG tablet TAKE 1 TABLET(50 MG) BY MOUTH TWICE DAILY  . hydrochlorothiazide (HYDRODIURIL) 25 MG tablet Take 1 tablet (25 mg total) by mouth daily.  . zidovudine (RETROVIR) 300 MG tablet Take 1 tablet (300 mg total) by mouth 2 (two) times daily.   Facility-Administered Encounter Medications as of 05/17/2020  Medication  . ciprofloxacin (CIPRO) IVPB 400 mg     Patient Active Problem List   Diagnosis Date Noted  . Healthcare maintenance 11/09/2018  . 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 HYPERTENSION, BENIGN 01/26/2007  . 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 HIV disease ([place]) 10/26/2006  . HX, PERSONAL, PENICILLIN ALLERGY 10/26/2006  . HX, PERSONAL, DRUG ALLERGY NOS 10/26/2006     Health Maintenance Due  Topic Date Due  . COLONOSCOPY  Never done  . PAP SMEAR-Modifier  01/23/2015  . INFLUENZA  VACCINE  02/26/2020     Review of Systems Review of Systems  Constitutional: Negative for fever, chills, diaphoresis, activity change, appetite change, fatigue and unexpected weight change.  HENT: Negative for congestion, sore throat, rhinorrhea, sneezing, trouble swallowing and sinus pressure.  Eyes: Negative for photophobia and visual disturbance.  Respiratory: Negative for cough, chest tightness, shortness of breath, wheezing and stridor.  Cardiovascular: Negative for chest pain, palpitations and leg swelling.  Gastrointestinal: Negative for nausea, vomiting, abdominal pain, diarrhea, constipation, blood in stool, abdominal distention and anal bleeding.  Genitourinary: Negative for dysuria, hematuria, flank pain and difficulty urinating.  Musculoskeletal: Negative for myalgias, back pain, joint swelling, arthralgias and gait problem.  Skin: Negative for color change, pallor, rash and wound.  Neurological: Negative for dizziness, tremors, weakness and light-headedness.  Hematological: Negative for adenopathy. Does not bruise/bleed easily.  Psychiatric/Behavioral: Negative for behavioral problems, confusion, sleep disturbance, dysphoric mood, decreased concentration and agitation.    Physical Exam   BP (!) 150/96   Pulse 78   Temp 98.9 F (37.2 C) (Oral)   Ht '5\' 2"'  (1.575 m)   Wt 132 lb (59.9 kg)   SpO2 100%   BMI 24.14 kg/m   Physical Exam  Constitutional:  oriented to person, place, and time. appears well-developed and well-nourished. No distress.  HENT: [place]/AT, PERRLA, no scleral icterus Mouth/Throat: Oropharynx is clear and moist. No oropharyngeal exudate.  Cardiovascular: Normal rate, regular rhythm and normal heart sounds. Exam reveals no gallop and no friction rub.  No murmur heard.  Pulmonary/Chest: Effort normal  and breath sounds normal. No respiratory distress.  has no wheezes.  Neck = supple, no nuchal rigidity Abdominal: Soft. Bowel sounds are normal.  exhibits no  distension. There is no tenderness.  Lymphadenopathy: no cervical adenopathy. No axillary adenopathy Neurological: alert and oriented to person, place, and time.  Skin: Skin is warm and dry. No rash noted. No erythema.  Psychiatric: a normal mood and affect.  behavior is normal.   Lab Results  Component Value Date   CD4TCELL 23 (L) 05/01/2020   Lab Results  Component Value Date   CD4TABS 360 (L) 05/01/2020   CD4TABS 250 (L) 01/04/2020   CD4TABS 231 (L) 06/21/2019   Lab Results  Component Value Date   HIV1RNAQUANT <20 (H) 05/01/2020   Lab Results  Component Value Date   HEPBSAB INDETER (A) 08/08/2009   Lab Results  Component Value Date   LABRPR NON-REACTIVE 05/01/2020    CBC Lab Results  Component Value Date   WBC 4.8 05/01/2020   RBC 4.09 05/01/2020   HGB 12.7 05/01/2020   HCT 37.7 05/01/2020   PLT 172 05/01/2020   MCV 92.2 05/01/2020   MCH 31.1 05/01/2020   MCHC 33.7 05/01/2020   RDW 14.1 05/01/2020   LYMPHSABS 1,522 05/01/2020   MONOABS 0.6 08/28/2018   EOSABS 91 05/01/2020    BMET Lab Results  Component Value Date   NA 140 05/01/2020   K 3.5 05/01/2020   CL 102 05/01/2020   CO2 29 05/01/2020   GLUCOSE 92 05/01/2020   BUN 11 05/01/2020   CREATININE 1.06 (H) 05/01/2020   CALCIUM 9.5 05/01/2020   GFRNONAA 55 (L) 05/01/2020   GFRAA 64 05/01/2020      Assessment and Plan  hiv disease = well controlled;continue on current regimen  Long term medication = cr is stable  htn = alitte above goal; has not taken BP meds  Health maintenance = up todate, but has booster next week.  mammo clean in June 2021

## 2020-08-14 ENCOUNTER — Ambulatory Visit: Payer: Self-pay

## 2020-08-14 ENCOUNTER — Other Ambulatory Visit: Payer: Self-pay

## 2020-08-22 ENCOUNTER — Encounter: Payer: Self-pay | Admitting: Internal Medicine

## 2020-08-31 ENCOUNTER — Other Ambulatory Visit: Payer: Self-pay | Admitting: Internal Medicine

## 2020-08-31 DIAGNOSIS — B20 Human immunodeficiency virus [HIV] disease: Secondary | ICD-10-CM

## 2020-08-31 MED ORDER — TIVICAY 50 MG PO TABS: ORAL_TABLET | ORAL | 2 refills | Status: DC

## 2020-08-31 MED ORDER — ZIDOVUDINE 300 MG PO TABS: 300.0000 mg | ORAL_TABLET | Freq: Two times a day (BID) | ORAL | 2 refills | Status: DC

## 2020-09-07 ENCOUNTER — Other Ambulatory Visit: Payer: Self-pay | Admitting: Internal Medicine

## 2020-10-02 ENCOUNTER — Other Ambulatory Visit: Payer: Self-pay | Admitting: Internal Medicine

## 2020-10-02 ENCOUNTER — Other Ambulatory Visit: Payer: Self-pay

## 2020-10-02 DIAGNOSIS — B20 Human immunodeficiency virus [HIV] disease: Secondary | ICD-10-CM

## 2020-10-02 MED ORDER — ZIDOVUDINE 300 MG PO TABS: 300.0000 mg | ORAL_TABLET | Freq: Two times a day (BID) | ORAL | 0 refills | Status: DC

## 2020-10-02 MED ORDER — TIVICAY 50 MG PO TABS: ORAL_TABLET | ORAL | 0 refills | Status: DC

## 2020-10-02 MED ORDER — EVOTAZ 300-150 MG PO TABS: ORAL_TABLET | ORAL | 0 refills | Status: DC

## 2020-10-26 ENCOUNTER — Other Ambulatory Visit: Payer: Self-pay

## 2020-10-26 DIAGNOSIS — B20 Human immunodeficiency virus [HIV] disease: Secondary | ICD-10-CM

## 2020-11-01 ENCOUNTER — Other Ambulatory Visit: Payer: Self-pay

## 2020-11-01 ENCOUNTER — Other Ambulatory Visit: Payer: Self-pay | Admitting: Internal Medicine

## 2020-11-01 DIAGNOSIS — B20 Human immunodeficiency virus [HIV] disease: Secondary | ICD-10-CM

## 2020-11-02 ENCOUNTER — Other Ambulatory Visit: Payer: Self-pay

## 2020-11-02 DIAGNOSIS — B20 Human immunodeficiency virus [HIV] disease: Secondary | ICD-10-CM

## 2020-11-02 LAB — T-HELPER CELL (CD4) - (RCID CLINIC ONLY)
CD4 % Helper T Cell: 18 % — ABNORMAL LOW (ref 33–65)
CD4 T Cell Abs: 278 /uL — ABNORMAL LOW (ref 400–1790)

## 2020-11-02 MED ORDER — TIVICAY 50 MG PO TABS: ORAL_TABLET | ORAL | 0 refills | Status: DC

## 2020-11-02 MED ORDER — ZIDOVUDINE 300 MG PO TABS: 300.0000 mg | ORAL_TABLET | Freq: Two times a day (BID) | ORAL | 0 refills | Status: DC

## 2020-11-02 MED ORDER — EVOTAZ 300-150 MG PO TABS: ORAL_TABLET | ORAL | 0 refills | Status: DC

## 2020-11-04 LAB — COMPLETE METABOLIC PANEL WITH GFR
AG Ratio: 1.4 (calc) (ref 1.0–2.5)
ALT: 16 U/L (ref 6–29)
AST: 28 U/L (ref 10–35)
Albumin: 4.1 g/dL (ref 3.6–5.1)
Alkaline phosphatase (APISO): 68 U/L (ref 37–153)
BUN: 9 mg/dL (ref 7–25)
CO2: 31 mmol/L (ref 20–32)
Calcium: 9.3 mg/dL (ref 8.6–10.4)
Chloride: 104 mmol/L (ref 98–110)
Creat: 0.95 mg/dL (ref 0.50–0.99)
GFR, Est African American: 73 mL/min/{1.73_m2} (ref >=60)
GFR, Est Non African American: 63 mL/min/{1.73_m2} (ref >=60)
Globulin: 3 g/dL (calc) (ref 1.9–3.7)
Glucose, Bld: 75 mg/dL (ref 65–99)
Potassium: 3.8 mmol/L (ref 3.5–5.3)
Sodium: 141 mmol/L (ref 135–146)
Total Bilirubin: 0.6 mg/dL (ref 0.2–1.2)
Total Protein: 7.1 g/dL (ref 6.1–8.1)

## 2020-11-04 LAB — CBC WITH DIFFERENTIAL/PLATELET
Absolute Monocytes: 541 cells/uL (ref 200–950)
Basophils Absolute: 19 cells/uL (ref 0–200)
Basophils Relative: 0.4 %
Eosinophils Absolute: 71 cells/uL (ref 15–500)
Eosinophils Relative: 1.5 %
HCT: 39.7 % (ref 35.0–45.0)
Hemoglobin: 13.2 g/dL (ref 11.7–15.5)
Lymphs Abs: 1532 cells/uL (ref 850–3900)
MCH: 29.5 pg (ref 27.0–33.0)
MCHC: 33.2 g/dL (ref 32.0–36.0)
MCV: 88.6 fL (ref 80.0–100.0)
MPV: 10.3 fL (ref 7.5–12.5)
Monocytes Relative: 11.5 %
Neutro Abs: 2538 cells/uL (ref 1500–7800)
Neutrophils Relative %: 54 %
Platelets: 150 10*3/uL (ref 140–400)
RBC: 4.48 10*6/uL (ref 3.80–5.10)
RDW: 14.2 % (ref 11.0–15.0)
Total Lymphocyte: 32.6 %
WBC: 4.7 10*3/uL (ref 3.8–10.8)

## 2020-11-04 LAB — HIV-1 RNA QUANT-NO REFLEX-BLD
HIV 1 RNA Quant: 9120 Copies/mL — ABNORMAL HIGH
HIV-1 RNA Quant, Log: 3.96 Log cps/mL — ABNORMAL HIGH

## 2020-11-20 ENCOUNTER — Encounter: Payer: No Typology Code available for payment source | Admitting: Internal Medicine

## 2020-12-03 ENCOUNTER — Other Ambulatory Visit: Payer: Self-pay | Admitting: Internal Medicine

## 2020-12-03 DIAGNOSIS — B20 Human immunodeficiency virus [HIV] disease: Secondary | ICD-10-CM

## 2020-12-04 NOTE — Telephone Encounter (Signed)
Appt 5/11

## 2020-12-05 ENCOUNTER — Encounter: Payer: Self-pay | Admitting: Internal Medicine

## 2020-12-05 ENCOUNTER — Ambulatory Visit (INDEPENDENT_AMBULATORY_CARE_PROVIDER_SITE_OTHER): Payer: Medicare HMO | Admitting: Internal Medicine

## 2020-12-05 ENCOUNTER — Other Ambulatory Visit: Payer: Self-pay

## 2020-12-05 ENCOUNTER — Other Ambulatory Visit (HOSPITAL_COMMUNITY): Payer: Self-pay

## 2020-12-05 ENCOUNTER — Other Ambulatory Visit: Payer: Self-pay | Admitting: *Deleted

## 2020-12-05 VITALS — BP 162/92 | HR 75 | Temp 98.9°F | Ht 62.0 in | Wt 133.0 lb

## 2020-12-05 DIAGNOSIS — B20 Human immunodeficiency virus [HIV] disease: Secondary | ICD-10-CM

## 2020-12-05 DIAGNOSIS — Z7189 Other specified counseling: Secondary | ICD-10-CM

## 2020-12-05 DIAGNOSIS — I10 Essential (primary) hypertension: Secondary | ICD-10-CM

## 2020-12-05 DIAGNOSIS — Z79899 Other long term (current) drug therapy: Secondary | ICD-10-CM

## 2020-12-05 DIAGNOSIS — K0889 Other specified disorders of teeth and supporting structures: Secondary | ICD-10-CM

## 2020-12-05 MED ORDER — TIVICAY 50 MG PO TABS
ORAL_TABLET | ORAL | 5 refills | Status: DC
  Filled 2020-12-05: qty 60, 30d supply, fill #0

## 2020-12-05 MED ORDER — ZIDOVUDINE 300 MG PO TABS
300.0000 mg | ORAL_TABLET | Freq: Two times a day (BID) | ORAL | 5 refills | Status: DC
  Filled 2020-12-05: qty 60, 30d supply, fill #0

## 2020-12-05 MED ORDER — EVOTAZ 300-150 MG PO TABS
ORAL_TABLET | ORAL | 5 refills | Status: DC
  Filled 2020-12-05: qty 30, fill #0
  Filled 2020-12-06: qty 30, 30d supply, fill #0

## 2020-12-05 MED ORDER — HYDROCHLOROTHIAZIDE 25 MG PO TABS
ORAL_TABLET | ORAL | 5 refills | Status: DC
  Filled 2020-12-05: qty 30, 30d supply, fill #0

## 2020-12-05 NOTE — Progress Notes (Signed)
RFV: follow up for hiv disease  Patient ID: Veronica Robles, female   DOB: 03/01/1956, 65 y.o.   MRN: [id number]  HPI Veronica Robles is 65yo F with HIV disease,  Cd 4 count of 278/VL 9,120.  Currently on tivicay/evotaz/zidovudine. She has recently Lost her mother, and sister (died recently of lung cancer) and 2 nieces which she still has grief/adjustment from their deaths. Has retired and switched to [employer] but signing up for [place] since not enough money.   Has intermittent right sinus pain  Outpatient Encounter Medications as of 12/05/2020  Medication Sig  . atazanavir-cobicistat (EVOTAZ) 300-150 MG tablet TAKE 1 TABLET BY MOUTH DAILY. SWALLOW WHOLE. DO NOT CRUSH, CUT OR CHEW. TAKE WITH FOOD  . Blood Pressure Monitoring (ADULT BLOOD PRESSURE CUFF LG) KIT    . dolutegravir (TIVICAY) 50 MG tablet TAKE 1 TABLET(50 MG) BY MOUTH TWICE DAILY  . hydrochlorothiazide (HYDRODIURIL) 25 MG tablet TAKE 1 TABLET(25 MG) BY MOUTH DAILY  . zidovudine (RETROVIR) 300 MG tablet Take 1 tablet (300 mg total) by mouth 2 (two) times daily.  . [DISCONTINUED] DM-Phenylephrine-Acetaminophen 10-5-325 MG/15ML LIQD Take 8 mLs by mouth daily as needed (cough).   Facility-Administered Encounter Medications as of 12/05/2020  Medication  . ciprofloxacin (CIPRO) IVPB 400 mg     Patient Active Problem List   Diagnosis Date Noted  . Healthcare maintenance 11/09/2018  . 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 HYPERTENSION, BENIGN 01/26/2007  . 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 HIV disease ([place]) 10/26/2006  . HX, PERSONAL, PENICILLIN ALLERGY 10/26/2006  . HX, PERSONAL, DRUG ALLERGY NOS 10/26/2006     Health Maintenance Due  Topic Date Due  . COLONOSCOPY (Pts 45-22yr Insurance coverage will need to be confirmed)  Never done  . PAP SMEAR-Modifier  01/23/2015  . DEXA SCAN  Never done  . COVID-19 Vaccine (4 - Booster for Pfizer series) 11/25/2020     Review of Systems Review of  Systems  Constitutional: Negative for fever, chills, diaphoresis, activity change, appetite change, fatigue and unexpected weight change.  HENT: Negative for congestion, sore throat, rhinorrhea, sneezing, trouble swallowing and sinus pressure.  Eyes: Negative for photophobia and visual disturbance.  Respiratory: Negative for cough, chest tightness, shortness of breath, wheezing and stridor.  Cardiovascular: Negative for chest pain, palpitations and leg swelling.  Gastrointestinal: Negative for nausea, vomiting, abdominal pain, diarrhea, constipation, blood in stool, abdominal distention and anal bleeding.  Genitourinary: Negative for dysuria, hematuria, flank pain and difficulty urinating.  Musculoskeletal: Negative for myalgias, back pain, joint swelling, arthralgias and gait problem.  Skin: Negative for color change, pallor, rash and wound.  Neurological: Negative for dizziness, tremors, weakness and light-headedness.  Hematological: Negative for adenopathy. Does not bruise/bleed easily.  Psychiatric/Behavioral: Negative for behavioral problems, confusion, sleep disturbance, dysphoric mood, decreased concentration and agitation.    Physical Exam  BP (!) 162/92   Pulse 75   Temp 98.9 F (37.2 C) (Oral)   Ht '5\' 2"'  (1.575 m)   Wt 133 lb (60.3 kg)   SpO2 100%   BMI 24.33 kg/m  Physical Exam  Constitutional:  oriented to person, place, and time. appears well-developed and well-nourished. No distress.  HENT: [place]/AT, PERRLA, no scleral icterus Mouth/Throat: Oropharynx is clear and moist. No oropharyngeal exudate.  Cardiovascular: Normal rate, regular rhythm and normal heart sounds. Exam reveals no gallop and no friction rub.  No murmur heard.  Pulmonary/Chest: Effort normal and breath sounds normal. No respiratory distress.  has no wheezes.  Neck =  supple, no nuchal rigidity Abdominal: Soft. Bowel sounds are normal.  exhibits no distension. There is no tenderness.  Lymphadenopathy: no  cervical adenopathy. No axillary adenopathy Neurological: alert and oriented to person, place, and time.  Skin: Skin is warm and dry. No rash noted. No erythema.  Psychiatric: a normal mood and affect.  behavior is normal.    Lab Results  Component Value Date   CD4TCELL 18 (L) 11/01/2020   Lab Results  Component Value Date   CD4TABS 278 (L) 11/01/2020   CD4TABS 360 (L) 05/01/2020   CD4TABS 250 (L) 01/04/2020   Lab Results  Component Value Date   HIV1RNAQUANT 9,120 (H) 11/01/2020   Lab Results  Component Value Date   HEPBSAB INDETER (A) 08/08/2009   Lab Results  Component Value Date   LABRPR NON-REACTIVE 05/01/2020    CBC Lab Results  Component Value Date   WBC 4.7 11/01/2020   RBC 4.48 11/01/2020   HGB 13.2 11/01/2020   HCT 39.7 11/01/2020   PLT 150 11/01/2020   MCV 88.6 11/01/2020   MCH 29.5 11/01/2020   MCHC 33.2 11/01/2020   RDW 14.2 11/01/2020   LYMPHSABS 1,532 11/01/2020   MONOABS 0.6 08/28/2018   EOSABS 71 11/01/2020    BMET Lab Results  Component Value Date   NA 141 11/01/2020   K 3.8 11/01/2020   CL 104 11/01/2020   CO2 31 11/01/2020   GLUCOSE 75 11/01/2020   BUN 9 11/01/2020   CREATININE 0.95 11/01/2020   CALCIUM 9.3 11/01/2020   GFRNONAA 63 11/01/2020   GFRAA 73 11/01/2020    Assessment and Plan  hvi disease= will recheck with genotype  Health maintenance = has had 3 doses of covid vaccine, recommend to get 4th dose  Grief counseling = recommended to speak to janet. "god will give me strength"   Partial tooth fracture/retained = recommend to have dental follow up. ? Dental infection. - will get dentist appt

## 2020-12-06 ENCOUNTER — Telehealth: Payer: Self-pay

## 2020-12-06 ENCOUNTER — Other Ambulatory Visit (HOSPITAL_COMMUNITY): Payer: Self-pay

## 2020-12-06 DIAGNOSIS — I10 Essential (primary) hypertension: Secondary | ICD-10-CM

## 2020-12-06 DIAGNOSIS — B20 Human immunodeficiency virus [HIV] disease: Secondary | ICD-10-CM

## 2020-12-06 MED ORDER — TIVICAY 50 MG PO TABS: ORAL_TABLET | ORAL | 5 refills | Status: DC

## 2020-12-06 MED ORDER — ZIDOVUDINE 300 MG PO TABS: 300.0000 mg | ORAL_TABLET | Freq: Two times a day (BID) | ORAL | 5 refills | Status: DC

## 2020-12-06 MED ORDER — HYDROCHLOROTHIAZIDE 25 MG PO TABS: ORAL_TABLET | ORAL | 5 refills | Status: DC

## 2020-12-06 MED ORDER — EVOTAZ 300-150 MG PO TABS
ORAL_TABLET | ORAL | 5 refills | Status: DC
Start: 2020-12-06 — End: 2021-06-04

## 2020-12-06 NOTE — Telephone Encounter (Signed)
-----   Message from Roney Jaffe, CPhT sent at 12/06/2020  2:54 PM EDT ----- Regarding: medications Hey Jinny Blossom,  Would you be able to send all 4 of her medications to [facility] on [address] she was approved for SPAP. I will cancel the scripts at [facility] . H25, Evotaz, Tivicay, Retrovir    Thank You,  [name], [place] Patient [facility] Phone: [phone] Fax: [phone]

## 2020-12-06 NOTE — Telephone Encounter (Signed)
Resent requested medication to [facility] on [address]. RN was advised that patient was made aware by [facility] pharmacy team.   Beryle Flock, RN

## 2020-12-12 ENCOUNTER — Telehealth: Payer: Self-pay

## 2020-12-12 NOTE — Telephone Encounter (Signed)
Did she mention which medication? She is on three for her HIV I believe.

## 2020-12-12 NOTE — Telephone Encounter (Signed)
Oh ok perfect. Thank you!

## 2020-12-12 NOTE — Telephone Encounter (Signed)
Received call from patient that states she has a $500 copay for her medication that she previously did not have. Forwarding to pharmacy team for copay assistance if available.   Nkenge Sonntag Lorita Officer, RN

## 2020-12-18 ENCOUNTER — Other Ambulatory Visit: Payer: Self-pay | Admitting: Internal Medicine

## 2020-12-18 DIAGNOSIS — Z1231 Encounter for screening mammogram for malignant neoplasm of breast: Secondary | ICD-10-CM

## 2020-12-26 ENCOUNTER — Other Ambulatory Visit (HOSPITAL_COMMUNITY): Payer: Self-pay

## 2020-12-26 LAB — HIV-1 RNA ULTRAQUANT REFLEX TO GENTYP+
HIV 1 RNA Quant: 9230 copies/mL — ABNORMAL HIGH
HIV-1 RNA Quant, Log: 3.97 Log copies/mL — ABNORMAL HIGH

## 2020-12-26 LAB — HIV-1 GENOTYPE: HIV-1 Genotype: DETECTED — AB

## 2021-01-07 ENCOUNTER — Other Ambulatory Visit: Payer: Self-pay | Admitting: Family

## 2021-01-07 DIAGNOSIS — B20 Human immunodeficiency virus [HIV] disease: Secondary | ICD-10-CM

## 2021-01-07 NOTE — Progress Notes (Unsigned)
mm

## 2021-02-13 ENCOUNTER — Ambulatory Visit
Admission: RE | Admit: 2021-02-13 | Discharge: 2021-02-13 | Disposition: A | Discharge status: Home / Self Care | Payer: Medicare HMO | Source: Ambulatory Visit | Attending: Internal Medicine | Admitting: Internal Medicine

## 2021-02-13 ENCOUNTER — Other Ambulatory Visit: Payer: Self-pay

## 2021-02-13 DIAGNOSIS — Z1231 Encounter for screening mammogram for malignant neoplasm of breast: Secondary | ICD-10-CM

## 2021-02-19 ENCOUNTER — Ambulatory Visit (INDEPENDENT_AMBULATORY_CARE_PROVIDER_SITE_OTHER): Payer: Medicare HMO | Admitting: Internal Medicine

## 2021-02-19 ENCOUNTER — Ambulatory Visit: Payer: Medicare HMO

## 2021-02-19 ENCOUNTER — Other Ambulatory Visit: Payer: Self-pay

## 2021-02-19 ENCOUNTER — Ambulatory Visit (INDEPENDENT_AMBULATORY_CARE_PROVIDER_SITE_OTHER): Payer: Medicare HMO

## 2021-02-19 ENCOUNTER — Encounter: Payer: Self-pay | Admitting: Internal Medicine

## 2021-02-19 VITALS — BP 168/88 | HR 73 | Temp 98.8°F | Ht 62.0 in | Wt 131.0 lb

## 2021-02-19 DIAGNOSIS — Z23 Encounter for immunization: Secondary | ICD-10-CM

## 2021-02-19 DIAGNOSIS — I10 Essential (primary) hypertension: Secondary | ICD-10-CM | POA: Diagnosis not present

## 2021-02-19 DIAGNOSIS — Z789 Other specified health status: Secondary | ICD-10-CM

## 2021-02-19 DIAGNOSIS — B20 Human immunodeficiency virus [HIV] disease: Secondary | ICD-10-CM

## 2021-02-19 NOTE — Progress Notes (Signed)
RFV: follow up for hiv disease  Patient ID: Veronica Robles, female   DOB: 23-Apr-1956, 65 y.o.   MRN: [id number]  HPI Veronica Robles is a 65yo F with poorly controlled HIV disease, currently on tivicay, evotaz, and zidovudine. VL 9,800 in the Spring. She reports stressors in her family, where her sister is hospitalized in icu-due to recent GB removal. She often place other family members needs ahead of her own. She reports missing doses. She has not disclosed her health to her family members.   Outpatient Encounter Medications as of 02/19/2021  Medication Sig   atazanavir-cobicistat (EVOTAZ) 300-150 MG tablet TAKE 1 TABLET BY MOUTH DAILY. SWALLOW WHOLE. DO NOT CRUSH, CUT OR CHEW. TAKE WITH FOOD   Blood Pressure Monitoring (ADULT BLOOD PRESSURE CUFF LG) KIT     dolutegravir (TIVICAY) 50 MG tablet TAKE 1 TABLET(50 MG) BY MOUTH TWICE DAILY   hydrochlorothiazide (HYDRODIURIL) 25 MG tablet TAKE 1 TABLET (25 MG) BY MOUTH DAILY   zidovudine (RETROVIR) 300 MG tablet Take 1 tablet (300 mg total) by mouth 2 (two) times daily.   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   HYPERTENSION, BENIGN 01/26/2007   SHINGLES, RECURRENT 12/08/2006   HIV disease ([place]) 10/26/2006   HX, PERSONAL, PENICILLIN ALLERGY 10/26/2006   HX, PERSONAL, DRUG ALLERGY NOS 10/26/2006     Health Maintenance Due  Topic Date Due   Zoster Vaccines- Shingrix (1 of 2) Never done   COLONOSCOPY (Pts 45-61yr Insurance coverage will need to be confirmed)  Never done   PAP SMEAR-Modifier  01/23/2015   COVID-19 Vaccine (4 - Booster for Pfizer series) 08/28/2020   DEXA SCAN  Never done     Review of Systems Review of Systems  Constitutional: Negative for fever, chills, diaphoresis, activity change, appetite  change, fatigue and unexpected weight change.  HENT: Negative for congestion, sore throat, rhinorrhea, sneezing, trouble swallowing and sinus pressure.  Eyes: Negative for photophobia and visual disturbance.  Respiratory: Negative for cough, chest tightness, shortness of breath, wheezing and stridor.  Cardiovascular: Negative for chest pain, palpitations and leg swelling.  Gastrointestinal: Negative for nausea, vomiting, abdominal pain, diarrhea, constipation, blood in stool, abdominal distention and anal bleeding.  Genitourinary: Negative for dysuria, hematuria, flank pain and difficulty urinating.  Musculoskeletal: Negative for myalgias, back pain, joint swelling, arthralgias and gait problem.  Skin: Negative for color change, pallor, rash and wound.  Neurological: Negative for dizziness, tremors, weakness and light-headedness.  Hematological: Negative for adenopathy. Does not bruise/bleed easily.  Psychiatric/Behavioral: Negative for behavioral problems, confusion, sleep disturbance, dysphoric mood, decreased concentration and agitation.   Sochx: no smoking or drinking  Physical Exam   Ht _0  (1.575 m)   Wt 131 lb (59.4 kg)   BMI 23.96 kg/m   Physical Exam  Constitutional:  oriented to person, place, and time. appears well-developed and well-nourished. No distress.  HENT: [place]/AT, PERRLA, no scleral icterus Mouth/Throat: Oropharynx is clear and moist. No oropharyngeal exudate.  Cardiovascular: Normal rate, regular rhythm and normal heart sounds. Exam reveals no gallop and no friction rub.  No murmur heard.  Pulmonary/Chest: Effort normal and breath sounds normal. No respiratory distress.  has no wheezes.  Neck = supple, no nuchal rigidity Abdominal: Soft. Bowel sounds are normal.  exhibits no distension. There is no tenderness.  Lymphadenopathy: no cervical adenopathy. No axillary adenopathy Neurological: alert and oriented to person, place, and time.  Skin: Skin is warm and dry.  No rash noted. No erythema.  Psychiatric: a normal mood and affect.  behavior is normal.   Lab Results  Component Value Date   CD4TCELL 18 (L) 11/01/2020   Lab Results  Component Value Date   CD4TABS 278 (L) 11/01/2020   CD4TABS 360 (L) 05/01/2020   CD4TABS 250 (L) 01/04/2020   Lab Results  Component Value Date   HIV1RNAQUANT 9,230 (H) 12/05/2020   Lab Results  Component Value Date   HEPBSAB INDETER (A) 08/08/2009   Lab Results  Component Value Date   LABRPR NON-REACTIVE 05/01/2020    CBC Lab Results  Component Value Date   WBC 4.7 11/01/2020   RBC 4.48 11/01/2020   HGB 13.2 11/01/2020   HCT 39.7 11/01/2020   PLT 150 11/01/2020   MCV 88.6 11/01/2020   MCH 29.5 11/01/2020   MCHC 33.2 11/01/2020   RDW 14.2 11/01/2020   LYMPHSABS 1,532 11/01/2020   MONOABS 0.6 08/28/2018   EOSABS 71 11/01/2020    BMET Lab Results  Component Value Date   NA 141 11/01/2020   K 3.8 11/01/2020   CL 104 11/01/2020   CO2 31 11/01/2020   GLUCOSE 75 11/01/2020   BUN 9 11/01/2020   CREATININE 0.95 11/01/2020   CALCIUM 9.3 11/01/2020   GFRNONAA 63 11/01/2020   GFRAA 73 11/01/2020    Assessment and Plan  HIV disease= poorly-controlled due to adherence issues. Spent majority of visit on adherence counseling trying to prioritize her health above others. She is will to try to be more consistent with medications. Plan to check labs in 4 wks.  Hypertension = reminded patient to take hctz daily, since uncontrolled at this visit  Spent 30 min with patient

## 2021-02-19 NOTE — Progress Notes (Signed)
   Covid-19 Vaccination Clinic  Name:  Veronica Robles    MRN: [id number] DOB: 11-27-1955  02/19/2021  [name] was observed post Covid-19 immunization for 30 minutes based on pre-vaccination screening without incident. She was provided with Vaccine Information Sheet and instruction to access the V-Safe system.   [name] was instructed to call 911 with any severe reactions post vaccine: Difficulty breathing  Swelling of face and throat  A fast heartbeat  A bad rash all over body  Dizziness and weakness   Immunizations Administered     Name Date Dose VIS Date Route   PFIZER Comrnaty(Gray TOP) Covid-19 Vaccine 02/19/2021  2:50 PM 0.3 mL 07/05/2020 Intramuscular   Manufacturer: [place]   Lot: [id number]   NDC: [id number]      Landis Gandy, RN

## 2021-02-20 ENCOUNTER — Encounter: Payer: Self-pay | Admitting: Internal Medicine

## 2021-04-01 ENCOUNTER — Other Ambulatory Visit: Payer: Self-pay | Admitting: Internal Medicine

## 2021-04-01 DIAGNOSIS — I10 Essential (primary) hypertension: Secondary | ICD-10-CM

## 2021-04-02 ENCOUNTER — Encounter: Payer: Self-pay | Admitting: Internal Medicine

## 2021-04-02 ENCOUNTER — Ambulatory Visit (INDEPENDENT_AMBULATORY_CARE_PROVIDER_SITE_OTHER): Payer: Medicare HMO | Admitting: Internal Medicine

## 2021-04-02 ENCOUNTER — Other Ambulatory Visit: Payer: Self-pay

## 2021-04-02 VITALS — BP 168/81 | HR 84 | Temp 98.0°F | Ht 62.0 in | Wt 132.0 lb

## 2021-04-02 DIAGNOSIS — B20 Human immunodeficiency virus [HIV] disease: Secondary | ICD-10-CM

## 2021-04-02 DIAGNOSIS — I10 Essential (primary) hypertension: Secondary | ICD-10-CM | POA: Diagnosis not present

## 2021-04-02 DIAGNOSIS — Z79899 Other long term (current) drug therapy: Secondary | ICD-10-CM

## 2021-04-02 DIAGNOSIS — Z7189 Other specified counseling: Secondary | ICD-10-CM | POA: Diagnosis not present

## 2021-04-02 MED ORDER — AMLODIPINE BESYLATE 5 MG PO TABS: 5.0000 mg | ORAL_TABLET | Freq: Every day | ORAL | 11 refills | Status: DC

## 2021-04-02 NOTE — Progress Notes (Signed)
RFV: follow up for hiv disease  Patient ID: Veronica Robles, female   DOB: [birth year], 65 y.o.   MRN: [id number]  HPI 65yo F with hiv disease, hx of non-compliance. On tivicay, evotaz,zidovudine. CD 4 count of 278/VL 9,200 in May 2022, only missed 1 dose in 5 weeks. She is doing somewhat better in terms of focusing on her health. She is still sad due to the passing of her sister.  Outpatient Encounter Medications as of 04/02/2021  Medication Sig   atazanavir-cobicistat (EVOTAZ) 300-150 MG tablet TAKE 1 TABLET BY MOUTH DAILY. SWALLOW WHOLE. DO NOT CRUSH, CUT OR CHEW. TAKE WITH FOOD   Blood Pressure Monitoring (ADULT BLOOD PRESSURE CUFF LG) KIT     dolutegravir (TIVICAY) 50 MG tablet TAKE 1 TABLET(50 MG) BY MOUTH TWICE DAILY   hydrochlorothiazide (HYDRODIURIL) 25 MG tablet TAKE 1 TABLET (25 MG) BY MOUTH DAILY   zidovudine (RETROVIR) 300 MG tablet Take 1 tablet (300 mg total) by mouth 2 (two) times daily.   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   HYPERTENSION, BENIGN 01/26/2007   SHINGLES, RECURRENT 12/08/2006   HIV disease ([place]) 10/26/2006   HX, PERSONAL, PENICILLIN ALLERGY 10/26/2006   HX, PERSONAL, DRUG ALLERGY NOS 10/26/2006     Health Maintenance Due  Topic Date Due   Zoster Vaccines- Shingrix (1 of 2) Never done   COLONOSCOPY (Pts 45-49yr Insurance coverage will need to be confirmed)  Never done   PAP SMEAR-Modifier  01/23/2015   DEXA SCAN  Never done   INFLUENZA VACCINE  02/25/2021     Review of Systems  Constitutional: Negative for fever, chills, diaphoresis, activity change, appetite change, fatigue and unexpected weight change.  HENT: Negative for congestion, sore throat, rhinorrhea, sneezing, trouble swallowing and sinus pressure.   Eyes: Negative for photophobia and visual disturbance.  Respiratory: Negative for cough, chest tightness, shortness of breath, wheezing and stridor.  Cardiovascular: Negative for chest pain, palpitations and leg swelling.  Gastrointestinal: Negative for nausea, vomiting, abdominal pain, diarrhea, constipation, blood in stool, abdominal distention and anal bleeding.  Genitourinary: Negative for dysuria, hematuria, flank pain and difficulty urinating.  Musculoskeletal: Negative for myalgias, back pain, joint swelling, arthralgias and gait problem.  Skin: Negative for color change, pallor, rash and wound.  Neurological: Negative for dizziness, tremors, weakness and light-headedness.  Hematological: Negative for adenopathy. Does not bruise/bleed easily.  Psychiatric/Behavioral: Negative for behavioral problems, confusion, sleep disturbance, dysphoric mood, decreased concentration and agitation.   Physical Exam   Ht '5\' 2"'  (1.575 m)   Wt 132 lb (59.9 kg)   BMI 24.14 kg/m   Physical Exam  Constitutional:  oriented to person, place, and time. appears well-developed and well-nourished. No distress.  HENT: [place]/AT, PERRLA, no scleral icterus Mouth/Throat: Oropharynx is clear and moist. No oropharyngeal exudate.  Cardiovascular: Normal rate, regular rhythm and normal heart sounds. Exam reveals no gallop and no friction rub.  No murmur heard.  Pulmonary/Chest: Effort normal and breath sounds normal. No respiratory distress.  has no wheezes.  Neck = supple, no nuchal rigidity Abdominal: Soft. Bowel sounds are normal.  exhibits no distension. There is no tenderness.  Lymphadenopathy: no cervical adenopathy. No axillary adenopathy Neurological: alert and oriented to person, place, and time.  Skin: Skin is warm and dry. No rash noted. No  erythema.  Psychiatric: a normal mood and affect.  behavior is normal.   Lab Results  Component Value Date   CD4TCELL 18 (L) 11/01/2020   Lab Results  Component  Value Date   CD4TABS 278 (L) 11/01/2020   CD4TABS 360 (L) 05/01/2020   CD4TABS 250 (L) 01/04/2020   Lab Results  Component Value Date   HIV1RNAQUANT 9,230 (H) 12/05/2020   Lab Results  Component Value Date   HEPBSAB INDETER (A) 08/08/2009   Lab Results  Component Value Date   LABRPR NON-REACTIVE 05/01/2020    CBC Lab Results  Component Value Date   WBC 4.7 11/01/2020   RBC 4.48 11/01/2020   HGB 13.2 11/01/2020   HCT 39.7 11/01/2020   PLT 150 11/01/2020   MCV 88.6 11/01/2020   MCH 29.5 11/01/2020   MCHC 33.2 11/01/2020   RDW 14.2 11/01/2020   LYMPHSABS 1,532 11/01/2020   MONOABS 0.6 08/28/2018   EOSABS 71 11/01/2020    BMET Lab Results  Component Value Date   NA 141 11/01/2020   K 3.8 11/01/2020   CL 104 11/01/2020   CO2 31 11/01/2020   GLUCOSE 75 11/01/2020   BUN 9 11/01/2020   CREATININE 0.95 11/01/2020   CALCIUM 9.3 11/01/2020   GFRNONAA 63 11/01/2020   GFRAA 73 11/01/2020      Assessment and Plan  Hiv disease= doing better with adherence. Will check labs today.  Long-term medication management = will check cr  hypertension = continues to be not well controlled. Thus, we will add amlodipine with HCTZ  Health maitenance = recommend for her to get covid bivalent vaccine

## 2021-04-02 NOTE — Patient Instructions (Addendum)
  For primary care: [facility] physicians = [phone]

## 2021-04-08 ENCOUNTER — Other Ambulatory Visit (HOSPITAL_COMMUNITY): Payer: Self-pay

## 2021-04-12 LAB — HIV-1 RNA ULTRAQUANT REFLEX TO GENTYP+
HIV 1 RNA Quant: 695 copies/mL — ABNORMAL HIGH
HIV-1 RNA Quant, Log: 2.84 Log copies/mL — ABNORMAL HIGH

## 2021-04-12 LAB — HIV-1 GENOTYPE: HIV-1 Genotype: DETECTED — AB

## 2021-05-10 ENCOUNTER — Telehealth: Payer: Self-pay

## 2021-05-10 NOTE — Telephone Encounter (Signed)
Patient called stating she stopped her amlodipine due rapid heart rate while taking. Patient has also discussed this with her new pcp. She wanted to let you know as well Dr. Baxter Flattery. Patient is now seeing Dr. Louis Matte  with [facility] for primary care.  Veronica Robles

## 2021-05-23 ENCOUNTER — Other Ambulatory Visit: Payer: Medicare HMO

## 2021-05-23 ENCOUNTER — Other Ambulatory Visit: Payer: Self-pay

## 2021-05-23 DIAGNOSIS — B20 Human immunodeficiency virus [HIV] disease: Secondary | ICD-10-CM

## 2021-05-24 LAB — BASIC METABOLIC PANEL
BUN: 12 mg/dL (ref 7–25)
CO2: 32 mmol/L (ref 20–32)
Calcium: 9.5 mg/dL (ref 8.6–10.4)
Chloride: 101 mmol/L (ref 98–110)
Creat: 0.96 mg/dL (ref 0.50–1.05)
Glucose, Bld: 87 mg/dL (ref 65–99)
Potassium: 3.4 mmol/L — ABNORMAL LOW (ref 3.5–5.3)
Sodium: 140 mmol/L (ref 135–146)

## 2021-06-04 ENCOUNTER — Other Ambulatory Visit: Payer: Self-pay | Admitting: Internal Medicine

## 2021-06-04 DIAGNOSIS — B20 Human immunodeficiency virus [HIV] disease: Secondary | ICD-10-CM

## 2021-06-04 MED ORDER — ZIDOVUDINE 300 MG PO TABS
300.0000 mg | ORAL_TABLET | Freq: Two times a day (BID) | ORAL | 0 refills | Status: DC
Start: 2021-06-04 — End: 2021-07-02

## 2021-06-04 MED ORDER — TIVICAY 50 MG PO TABS: ORAL_TABLET | ORAL | 0 refills | Status: DC

## 2021-06-13 ENCOUNTER — Other Ambulatory Visit: Payer: Self-pay

## 2021-06-13 ENCOUNTER — Encounter: Payer: Self-pay | Admitting: Internal Medicine

## 2021-06-13 ENCOUNTER — Ambulatory Visit (INDEPENDENT_AMBULATORY_CARE_PROVIDER_SITE_OTHER): Payer: Medicare HMO | Admitting: Internal Medicine

## 2021-06-13 VITALS — BP 154/77 | HR 81 | Temp 97.1°F | Wt 136.0 lb

## 2021-06-13 DIAGNOSIS — I10 Essential (primary) hypertension: Secondary | ICD-10-CM | POA: Diagnosis not present

## 2021-06-13 DIAGNOSIS — B20 Human immunodeficiency virus [HIV] disease: Secondary | ICD-10-CM | POA: Diagnosis not present

## 2021-06-13 DIAGNOSIS — Z79899 Other long term (current) drug therapy: Secondary | ICD-10-CM

## 2021-06-13 DIAGNOSIS — Z7189 Other specified counseling: Secondary | ICD-10-CM | POA: Diagnosis not present

## 2021-06-13 DIAGNOSIS — E876 Hypokalemia: Secondary | ICD-10-CM

## 2021-06-13 NOTE — Progress Notes (Signed)
RFV: follow up for hiv disease  Patient ID: Veronica Robles, female   DOB: [birth year], 65 y.o.   MRN: [id number]  HPI 65yo F with history of hiv disease, not well-controlled and hypertension. In the past, has had difficulty with adherence. Mainly accepting hiv disease diagnosis. Has not disclosed to her family. Recent family stressors fr reason why she has not taken her medications. Now her sister back on her own, not living with Veronica Robles anymore.   Tivicay-evotaz-retrovir  Was briefly on omeprazole and now no longer taking it - 1 month. Now no longer needs But now getting cholesterol medication- but has not started  Started to be seen at [facility] physician   Outpatient Encounter Medications as of 06/13/2021  Medication Sig   amLODipine (NORVASC) 5 MG tablet Take 1 tablet (5 mg total) by mouth daily.   atazanavir-cobicistat (EVOTAZ) 300-150 MG tablet TAKE 1 TABLET BY MOUTH DAILY. SWALLOW WHOLE. DO NOT CRUSH, CUT OR CHEW. TAKE WITH FOOD   Blood Pressure Monitoring (ADULT BLOOD PRESSURE CUFF LG) KIT     dolutegravir (TIVICAY) 50 MG tablet TAKE 1 TABLET(50 MG) BY MOUTH TWICE DAILY   hydrochlorothiazide (HYDRODIURIL) 25 MG tablet TAKE 1 TABLET(25 MG) BY MOUTH DAILY   zidovudine (RETROVIR) 300 MG tablet Take 1 tablet (300 mg total) by mouth 2 (two) times daily.   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   HIV disease ([place]) 10/26/2006   HX, PERSONAL, PENICILLIN ALLERGY 10/26/2006   HX, PERSONAL, DRUG ALLERGY NOS 10/26/2006     Health Maintenance Due  Topic Date Due   Zoster Vaccines- Shingrix (1 of 2) Never done   COLONOSCOPY (Pts 45-33yr Insurance coverage will need to be confirmed)   Never done   PAP SMEAR-Modifier  01/23/2015   DEXA SCAN  Never done   INFLUENZA VACCINE  02/25/2021   COVID-19 Vaccine (5 - Booster for Pfizer series) 04/16/2021     Review of Systems  Physical Exam   BP (!) 154/77   Pulse 81   Temp (!) 97.1 F (36.2 C) (Temporal)   Wt 136 lb (61.7 kg)   BMI 24.87 kg/m   Physical Exam  Constitutional:  oriented to person, place, and time. appears well-developed and well-nourished. No distress.  HENT: Veronica Robles/AT, PERRLA, no scleral icterus Mouth/Throat: Oropharynx is clear and moist. No oropharyngeal exudate.  Cardiovascular: Normal rate, regular rhythm and normal heart sounds. Exam reveals no gallop and no friction rub.  No murmur heard.  Pulmonary/Chest: Effort normal and breath sounds normal. No respiratory distress.  has no wheezes.  Neck = supple, no nuchal rigidity Abdominal: Soft. Bowel sounds are normal.  exhibits no distension. There is no tenderness.  Lymphadenopathy: no cervical adenopathy. No axillary adenopathy Neurological: alert and oriented to person, place, and time.  Skin: Skin is warm and dry. No rash noted. No erythema.  Psychiatric: a normal mood and affect.  behavior is normal.   Lab Results  Component Value Date   CD4TCELL 18 (L) 11/01/2020   Lab Results  Component Value Date   CD4TABS 278 (L) 11/01/2020   CD4TABS 360 (L) 05/01/2020   CD4TABS 250 (L) 01/04/2020   Lab Results  Component Value Date   HIV1RNAQUANT 695 (H) 04/02/2021  Lab Results  Component Value Date   HEPBSAB INDETER (A) 08/08/2009   Lab Results  Component Value Date   LABRPR NON-REACTIVE 05/01/2020    CBC Lab Results  Component Value Date   WBC 4.7 11/01/2020   RBC 4.48 11/01/2020   HGB 13.2 11/01/2020   HCT 39.7 11/01/2020   PLT 150 11/01/2020   MCV 88.6 11/01/2020   MCH 29.5 11/01/2020   MCHC 33.2 11/01/2020   RDW 14.2 11/01/2020   LYMPHSABS 1,532 11/01/2020   MONOABS 0.6 08/28/2018   EOSABS 71 11/01/2020    BMET Lab Results   Component Value Date   NA 140 05/23/2021   K 3.4 (L) 05/23/2021   CL 101 05/23/2021   CO2 32 05/23/2021   GLUCOSE 87 05/23/2021   BUN 12 05/23/2021   CREATININE 0.96 05/23/2021   CALCIUM 9.5 05/23/2021   GFRNONAA 63 11/01/2020   GFRAA 73 11/01/2020      Assessment and Plan  Hiv disease = will check hiv labs, to make sure undetectable  Hx of hypokalemia = Will also check bmp to see if still hypokalemic  Usually well controlled with BP - at her md office is WNL.  Pimple on lip line = can do warm compress.   Uptodate to vaccine - getting shingles vaccine later.PCP gave flu shot

## 2021-06-14 LAB — T-HELPER CELL (CD4) - (RCID CLINIC ONLY)
CD4 % Helper T Cell: 19 % — ABNORMAL LOW (ref 33–65)
CD4 T Cell Abs: 302 /uL — ABNORMAL LOW (ref 400–1790)

## 2021-06-16 LAB — CBC WITH DIFFERENTIAL/PLATELET
Absolute Monocytes: 540 cells/uL (ref 200–950)
Basophils Absolute: 11 cells/uL (ref 0–200)
Basophils Relative: 0.2 %
Eosinophils Absolute: 49 cells/uL (ref 15–500)
Eosinophils Relative: 0.9 %
HCT: 40.6 % (ref 35.0–45.0)
Hemoglobin: 13.6 g/dL (ref 11.7–15.5)
Lymphs Abs: 1550 cells/uL (ref 850–3900)
MCH: 31.1 pg (ref 27.0–33.0)
MCHC: 33.5 g/dL (ref 32.0–36.0)
MCV: 92.7 fL (ref 80.0–100.0)
MPV: 10.4 fL (ref 7.5–12.5)
Monocytes Relative: 10 %
Neutro Abs: 3251 cells/uL (ref 1500–7800)
Neutrophils Relative %: 60.2 %
Platelets: 194 10*3/uL (ref 140–400)
RBC: 4.38 10*6/uL (ref 3.80–5.10)
RDW: 14.1 % (ref 11.0–15.0)
Total Lymphocyte: 28.7 %
WBC: 5.4 10*3/uL (ref 3.8–10.8)

## 2021-06-16 LAB — HIV-1 RNA QUANT-NO REFLEX-BLD
HIV 1 RNA Quant: 317 Copies/mL — ABNORMAL HIGH
HIV-1 RNA Quant, Log: 2.5 Log cps/mL — ABNORMAL HIGH

## 2021-07-02 ENCOUNTER — Other Ambulatory Visit: Payer: Self-pay | Admitting: Internal Medicine

## 2021-07-02 ENCOUNTER — Other Ambulatory Visit: Payer: Self-pay

## 2021-07-02 DIAGNOSIS — B20 Human immunodeficiency virus [HIV] disease: Secondary | ICD-10-CM

## 2021-07-02 MED ORDER — TIVICAY 50 MG PO TABS: ORAL_TABLET | ORAL | 5 refills | Status: DC

## 2021-07-02 MED ORDER — ZIDOVUDINE 300 MG PO TABS: 300.0000 mg | ORAL_TABLET | Freq: Two times a day (BID) | ORAL | 5 refills | Status: DC

## 2021-07-02 MED ORDER — EVOTAZ 300-150 MG PO TABS: ORAL_TABLET | ORAL | 5 refills | Status: DC

## 2021-08-28 ENCOUNTER — Other Ambulatory Visit: Payer: Self-pay | Admitting: Internal Medicine

## 2021-08-28 DIAGNOSIS — Z13820 Encounter for screening for osteoporosis: Secondary | ICD-10-CM

## 2021-08-29 ENCOUNTER — Other Ambulatory Visit: Payer: Self-pay

## 2021-08-29 ENCOUNTER — Other Ambulatory Visit: Payer: Medicare HMO

## 2021-08-29 DIAGNOSIS — B20 Human immunodeficiency virus [HIV] disease: Secondary | ICD-10-CM

## 2021-08-30 LAB — BASIC METABOLIC PANEL
BUN: 11 mg/dL (ref 7–25)
CO2: 31 mmol/L (ref 20–32)
Calcium: 9.7 mg/dL (ref 8.6–10.4)
Chloride: 102 mmol/L (ref 98–110)
Creat: 1.02 mg/dL (ref 0.50–1.05)
Glucose, Bld: 95 mg/dL (ref 65–99)
Potassium: 4 mmol/L (ref 3.5–5.3)
Sodium: 140 mmol/L (ref 135–146)

## 2021-09-02 ENCOUNTER — Other Ambulatory Visit: Payer: Self-pay | Admitting: Internal Medicine

## 2021-09-02 DIAGNOSIS — B20 Human immunodeficiency virus [HIV] disease: Secondary | ICD-10-CM

## 2021-09-12 ENCOUNTER — Encounter: Payer: Medicare HMO | Admitting: Internal Medicine

## 2021-09-17 ENCOUNTER — Other Ambulatory Visit: Payer: Self-pay

## 2021-09-17 ENCOUNTER — Ambulatory Visit (INDEPENDENT_AMBULATORY_CARE_PROVIDER_SITE_OTHER): Payer: Medicare HMO | Admitting: Internal Medicine

## 2021-09-17 ENCOUNTER — Encounter: Payer: Self-pay | Admitting: Internal Medicine

## 2021-09-17 VITALS — BP 172/95 | HR 80 | Temp 98.9°F | Resp 16 | Ht 62.0 in | Wt 133.0 lb

## 2021-09-17 DIAGNOSIS — B20 Human immunodeficiency virus [HIV] disease: Secondary | ICD-10-CM

## 2021-09-17 NOTE — Progress Notes (Signed)
Patient ID: Veronica Robles, female   DOB: 09-17-1955, 66 y.o.   MRN: [id number]  HPI Veronica Robles, is a 66yo F with hiv disease, tivicay, evotaz, zidovine. Not missing any doses in the last 2 weeks. Still morning loss of her sister that passed away last year. Went to her [facility].   Outpatient Encounter Medications as of 09/17/2021  Medication Sig   amLODipine (NORVASC) 5 MG tablet Take 1 tablet (5 mg total) by mouth daily.   atazanavir-cobicistat (EVOTAZ) 300-150 MG tablet Swallow whole. Do NOT crush, cut or chew tablet. Take with food.   Blood Pressure Monitoring (ADULT BLOOD PRESSURE CUFF LG) KIT     dolutegravir (TIVICAY) 50 MG tablet TAKE 1 TABLET(50 MG) BY MOUTH TWICE DAILY   hydrochlorothiazide (HYDRODIURIL) 25 MG tablet TAKE 1 TABLET(25 MG) BY MOUTH DAILY   zidovudine (RETROVIR) 300 MG tablet Take 1 tablet (300 mg total) by mouth 2 (two) times daily.   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   HIV disease ([place]) 10/26/2006   HX, PERSONAL, PENICILLIN ALLERGY 10/26/2006   HX, PERSONAL, DRUG ALLERGY NOS 10/26/2006     Health Maintenance Due  Topic Date Due   Zoster Vaccines- Shingrix (1 of 2) Never done   COLONOSCOPY (Pts 45-22yr Insurance coverage will need to be confirmed)  Never done   PAP SMEAR-Modifier  01/23/2015   DEXA SCAN  Never done   COVID-19 Vaccine (6 - Booster for Pfizer series) 04/16/2021     Review of Systems  Physical Exam   BP (!) 172/95   Pulse 80   Temp 98.9 F (37.2 C) (Oral)   Resp 16   Ht _0  (1.575 m)   Wt 133 lb (60.3 kg)   SpO2 98%   BMI 24.33 kg/m   Physical Exam  Constitutional:  oriented to person, place, and time. appears  well-developed and well-nourished. No distress.  HENT: [place]/AT, PERRLA, no scleral icterus Mouth/Throat: Oropharynx is clear and moist. No oropharyngeal exudate.  Cardiovascular: Normal rate, regular rhythm and normal heart sounds. Exam reveals no gallop and no friction rub.  No murmur heard.  Pulmonary/Chest: Effort normal and breath sounds normal. No respiratory distress.  has no wheezes.  Neck = supple, no nuchal rigidity Abdominal: Soft. Bowel sounds are normal.  exhibits no distension. There is no tenderness.  Lymphadenopathy: no cervical adenopathy. No axillary adenopathy Neurological: alert and oriented to person, place, and time.  Skin: Skin is warm and dry. No rash noted. No erythema.  Psychiatric: a normal mood and affect.  behavior is normal.    Lab Results  Component Value Date   CD4TCELL 19 (L) 06/13/2021   Lab Results  Component Value Date   CD4TABS 302 (L) 06/13/2021   CD4TABS 278 (L) 11/01/2020   CD4TABS 360 (L) 05/01/2020   Lab Results  Component Value Date   HIV1RNAQUANT 317 (H) 06/13/2021   Lab Results  Component Value Date   HEPBSAB INDETER (A) 08/08/2009   Lab Results  Component Value Date   LABRPR NON-REACTIVE 05/01/2020    CBC Lab Results  Component Value Date   WBC 5.4 06/13/2021   RBC 4.38 06/13/2021   HGB 13.6 06/13/2021   HCT 40.6 06/13/2021  PLT 194 06/13/2021   MCV 92.7 06/13/2021   MCH 31.1 06/13/2021   MCHC 33.5 06/13/2021   RDW 14.1 06/13/2021   LYMPHSABS 1,550 06/13/2021   MONOABS 0.6 08/28/2018   EOSABS 49 06/13/2021    BMET Lab Results  Component Value Date   NA 140 08/29/2021   K 4.0 08/29/2021   CL 102 08/29/2021   CO2 31 08/29/2021   GLUCOSE 95 08/29/2021   BUN 11 08/29/2021   CREATININE 1.02 08/29/2021   CALCIUM 9.7 08/29/2021   GFRNONAA 63 11/01/2020   GFRAA 73 11/01/2020      Assessment and Plan   Needs to change to different provider who doesn't charge for her medicare Htn - elevated in office due to  stress; continue on current regimen  Hiv disease= continue on current regimen. Well controlled  Long term medication management = cr is stable

## 2021-09-19 LAB — HIV-1 RNA QUANT-NO REFLEX-BLD
HIV 1 RNA Quant: 16100 Copies/mL — ABNORMAL HIGH
HIV-1 RNA Quant, Log: 4.21 Log cps/mL — ABNORMAL HIGH

## 2021-09-19 LAB — T-HELPER CELLS (CD4) COUNT (NOT AT ARMC)
Absolute CD4: 283 cells/uL — ABNORMAL LOW (ref 490–1740)
CD4 T Helper %: 19 % — ABNORMAL LOW (ref 30–61)
Total lymphocyte count: 1471 cells/uL (ref 850–3900)

## 2021-10-08 ENCOUNTER — Other Ambulatory Visit: Payer: Self-pay | Admitting: Internal Medicine

## 2021-10-08 DIAGNOSIS — B20 Human immunodeficiency virus [HIV] disease: Secondary | ICD-10-CM

## 2021-10-08 DIAGNOSIS — I10 Essential (primary) hypertension: Secondary | ICD-10-CM

## 2021-11-03 ENCOUNTER — Other Ambulatory Visit: Payer: Self-pay | Admitting: Internal Medicine

## 2021-11-03 DIAGNOSIS — I10 Essential (primary) hypertension: Secondary | ICD-10-CM

## 2021-11-21 ENCOUNTER — Encounter (HOSPITAL_BASED_OUTPATIENT_CLINIC_OR_DEPARTMENT_OTHER): Payer: Self-pay | Admitting: Nurse Practitioner

## 2021-11-21 ENCOUNTER — Ambulatory Visit (INDEPENDENT_AMBULATORY_CARE_PROVIDER_SITE_OTHER): Payer: Medicare HMO | Admitting: Nurse Practitioner

## 2021-11-21 ENCOUNTER — Other Ambulatory Visit (HOSPITAL_BASED_OUTPATIENT_CLINIC_OR_DEPARTMENT_OTHER): Payer: Self-pay

## 2021-11-21 VITALS — BP 128/88 | HR 79 | Ht 62.0 in | Wt 133.4 lb

## 2021-11-21 DIAGNOSIS — K029 Dental caries, unspecified: Secondary | ICD-10-CM | POA: Diagnosis not present

## 2021-11-21 DIAGNOSIS — B078 Other viral warts: Secondary | ICD-10-CM

## 2021-11-21 DIAGNOSIS — Z0000 Encounter for general adult medical examination without abnormal findings: Secondary | ICD-10-CM | POA: Diagnosis not present

## 2021-11-21 DIAGNOSIS — B20 Human immunodeficiency virus [HIV] disease: Secondary | ICD-10-CM

## 2021-11-21 DIAGNOSIS — I10 Essential (primary) hypertension: Secondary | ICD-10-CM

## 2021-11-21 MED ORDER — IMIQUIMOD 5 % EX CREA
TOPICAL_CREAM | CUTANEOUS | 3 refills | Status: DC
  Filled 2021-11-21: qty 24, 30d supply, fill #0

## 2021-11-21 MED ORDER — CHLORHEXIDINE GLUCONATE 0.12 % MT SOLN
OROMUCOSAL | 1 refills | Status: DC
Start: 2021-11-21 — End: 2022-03-12
  Filled 2021-11-21: qty 473, 15d supply, fill #0

## 2021-11-21 NOTE — Patient Instructions (Addendum)
Thank you for choosing [place] at [facility] for your Primary Care needs. I am excited for the opportunity to partner with you to meet your health care goals. It was a pleasure meeting you today! ? ?Recommendations from today's visit: ?We will plan to do your medicare wellness visit in the near future at your convenience ?We can check your labs when you come back for that.  ? ?Information on diet, exercise, and health maintenance recommendations are listed below. This is information to help you be sure you are on track for optimal health and monitoring.  ? ?Please look over this and let us know if you have any questions or if you have completed any of the health maintenance outside of [place] so that we can be sure your records are up to date.  ?___________________________________________________________ ?About Me: ?I am an Adult-Geriatric Nurse Practitioner with a background in caring for patients for more than 20 years with a strong intensive care background. I provide primary care and sports medicine services to patients age 47 and older within this office. My education had a strong focus on caring for the older adult population, which I am passionate about. I am also the director of the APP Fellowship with [facility].  ? ?My desire is to provide you with the best service through preventive medicine and supportive care. I consider you a part of the medical team and value your input. I work diligently to ensure that you are heard and your needs are met in a safe and effective manner. I want you to feel comfortable with me as your provider and want you to know that your health concerns are important to me. ? ?For your information, our office hours are: ?Monday, Tuesday, and Thursday 8:00 AM - 5:00 PM ?Wednesday and Friday 8:00 AM - 12:00 PM.  ? ?In my time away from the office I am teaching new APP's within the system and am unavailable, but my partner, Dr. Burnard Bunting is in the office for  emergent needs.  ? ?If you have questions or concerns, please call our office at [phone] or send [place] a MyChart message and we will respond as quickly as possible.  ?____________________________________________________________ ?MyChart:  ?For all urgent or time sensitive needs we ask that you please call the office to avoid delays. Our number is [phone]. ?MyChart is not constantly monitored and due to the large volume of messages a day, replies may take up to 72 business hours. ? ?MyChart Policy: ?MyChart allows for you to see your visit notes, after visit summary, provider recommendations, lab and tests results, make an appointment, request refills, and contact your provider or the office for non-urgent questions or concerns. Providers are seeing patients during normal business hours and do not have built in time to review MyChart messages.  ?We ask that you allow a minimum of 3 business days for responses to [employer]. For this reason, please do not send urgent requests through [place]. Please call the office at [phone]. ?New and ongoing conditions may require a visit. We have virtual and in person visit available for your convenience.  ?Complex MyChart concerns may require a visit. Your provider may request you schedule a virtual or in person visit to ensure we are providing the best care possible. ?MyChart messages sent after 11:00 AM on Friday will not be received by the provider until Monday morning.  ?  ?Lab and Test Results: ?You will receive your lab and test results on  MyChart as soon as they are completed and results have been sent by the lab or testing facility. Due to this service, you will receive your results BEFORE your provider.  ?I review lab and tests results each morning prior to seeing patients. Some results require collaboration with other providers to ensure you are receiving the most appropriate care. For this reason, we ask that you please allow a minimum of 3-5  business days from the time the ALL results have been received for your provider to receive and review lab and test results and contact you about these.  ?Most lab and test result comments from the provider will be sent through [place]. Your provider may recommend changes to the plan of care, follow-up visits, repeat testing, ask questions, or request an office visit to discuss these results. You may reply directly to this message or call the office at [phone] to provide information for the provider or set up an appointment. ?In some instances, you will be called with test results and recommendations. Please let us know if this is preferred and we will make note of this in your chart to provide this for you.    ?If you have not heard a response to your lab or test results in 5 business days from all results returning to [place], please call the office to let us know. We ask that you please avoid calling prior to this time unless there is an emergent concern. Due to high call volumes, this can delay the resulting process. ? ?After Hours: ?For all non-emergency after hours needs, please call the office at [phone] and select the option to reach the on-call provider service. On-call services are shared between multiple [place] offices and therefore it will not be possible to speak directly with your provider. On-call providers may provide medical advice and recommendations, but are unable to provide refills for maintenance medications.  ?For all emergency or urgent medical needs after normal business hours, we recommend that you seek care at the closest Urgent Care or Emergency Department to ensure appropriate treatment in a timely manner.  ?[facility] [place] has a 24 hour emergency room located on the ground floor for your convenience.  ? ?Urgent Concerns During the Business Day ?Providers are seeing patients from 8AM to [place] with a busy schedule and are most often not able to respond to  non-urgent calls until the end of the day or the next business day. ?If you should have URGENT concerns during the day, please call and speak to the nurse or schedule a same day appointment so that we can address your concern without delay.  ? ?Thank you, again, for choosing me as your health care partner. I appreciate your trust and look forward to learning more about you.  ? ?Worthy Keeler, DNP, AGNP-c ?___________________________________________________________ ? ?Health Maintenance Recommendations ?Screening Testing ?Mammogram ?Every 1 -2 years based on history and risk factors ?Starting at age 20 ?Pap Smear ?Ages 21-39 every 3 years ?Ages 67-65 every 5 years with HPV testing ?More frequent testing may be required based on results and history ?Colon Cancer Screening ?Every 1-10 years based on test performed, risk factors, and history ?Starting at age 14 ?Bone Density Screening ?Every 2-10 years based on history ?Starting at age 59 for women ?Recommendations for men differ based on medication usage, history, and risk factors ?AAA Screening ?One time ultrasound ?Men 11-8 years old who have every smoked ?Lung Cancer Screening ?Low Dose Lung CT every 12 months ?Age  55-80 years with a 30 pack-year smoking history who still smoke or who have quit within the last 15 years ? ?Screening Labs ?Routine  Labs: Complete Blood Count (CBC), Complete Metabolic Panel (CMP), Cholesterol (Lipid Panel) ?Every 6-12 months based on history and medications ?May be recommended more frequently based on current conditions or previous results ?Hemoglobin A1c Lab ?Every 3-12 months based on history and previous results ?Starting at age 56 or earlier with diagnosis of diabetes, high cholesterol, BMI >26, and/or risk factors ?Frequent monitoring for patients with diabetes to ensure blood sugar control ?Thyroid Panel (TSH w/ T3 & T4) ?Every 6 months based on history, symptoms, and risk factors ?May be repeated more often if on  medication ?HIV ?One time testing for all patients 74 and older ?May be repeated more frequently for patients with increased risk factors or exposure ?Hepatitis C ?One time testing for all patients 69 and older ?May be re

## 2021-11-21 NOTE — Progress Notes (Signed)
?Orma Render, DNP, AGNP-c ?[facility] Care & [facility] Medicine ?[place], [place] [address] ?[phone] ? ?New patient visit ? ? ?Patient: Veronica Robles   DOB: [birth year]   66 y.o. Female  MRN: [id number] ?Visit Date: 11/21/2021 ? ?Patient Care Team: ?Grantland Want, Coralee Pesa, NP as PCP - General (Nurse Practitioner) ?Carlyle Basques, MD as PCP - Infectious Diseases (Infectious Diseases) ? ?Today's Vitals  ? 11/21/21 1335  ?BP: 128/88  ?Pulse: 79  ?SpO2: 99%  ?Weight: 133 lb 6.4 oz (60.5 kg)  ?Height: '5\' 2"'  (1.575 m)  ? ?Body mass index is 24.4 kg/m?.  ? ?Today's healthcare provider: Orma Render, NP  ? ?Chief Complaint  ?Patient presents with  ? Establish Care  ? ?Subjective  ?  ?WAFAA DEEMER is a 66 y.o. female who presents today as a new patient to establish care.  ?  ?Patient endorses the following concerns presently: ?Dental Carries ?- chronic carries ?- awaiting evaluation by the dentist for recommendations ?- no drainage, fevers, chills, loss of ability for chewing, sinus p/p ? ?HIV ?- well controlled at this time ?- chronic, on anitvirals long term and followed by ID ?- no symptoms or SE at this time.  ?- currently not sharing diagnosis with family, appreciates discretion ? ?History reviewed and reveals the following: ?Past Medical History:  ?Diagnosis Date  ? HIV infection ([place])   ? Hypertension   ? ?Past Surgical History:  ?Procedure Laterality Date  ? ABDOMINAL HYSTERECTOMY    ? APPENDECTOMY    ? BREAST BIOPSY Right 2015  ? CYSTOSCOPY/RETROGRADE/URETEROSCOPY Right 11/14/2016  ? Procedure: CYSTOSCOPY/RETROGRADE/URETEROSCOPY/ STONE EXTRACTION/HOLMIUM LASER/STENT PLACEMENT;  Surgeon: Kathie Rhodes, MD;  Location: [facility];  Service: Urology;  Laterality: Right;  ? ?Family Status  ?Relation Name Status  ? Mother  Deceased  ? Father  Deceased  ? Sister  Alive  ? ?Family History  ?Problem Relation Age of Onset  ? Hypertension Mother   ? Cancer Father   ?     pancreatic  ?  Breast cancer Sister   ? ?Social History  ? ?Socioeconomic History  ? Marital status: Single  ?  Spouse name: Not on file  ? Number of children: Not on file  ? Years of education: Not on file  ? Highest education level: Not on file  ?Occupational History  ? Not on file  ?Tobacco Use  ? Smoking status: Former  ?  Packs/day: 0.10  ?  Types: Cigarettes  ? Smokeless tobacco: Never  ?Vaping Use  ? Vaping Use: Never used  ?Substance and Sexual Activity  ? Alcohol use: No  ?  Alcohol/week: 0.0 standard drinks  ? Drug use: No  ? Sexual activity: Not Currently  ?  Partners: Male  ?  Birth control/protection: Surgical  ?  Comment: declined condoms  ?Other Topics Concern  ? Not on file  ?Social History Narrative  ? Not on file  ? ?Social Determinants of Health  ? ?Financial Resource Strain: Not on file  ?Food Insecurity: Not on file  ?Transportation Needs: Not on file  ?Physical Activity: Not on file  ?Stress: Not on file  ?Social Connections: Not on file  ? ?Outpatient Medications Prior to Visit  ?Medication Sig  ? amLODipine (NORVASC) 5 MG tablet Take 1 tablet (5 mg total) by mouth daily.  ? atazanavir-cobicistat (EVOTAZ) 300-150 MG tablet Swallow whole. Do NOT crush, cut or chew tablet. Take with food.  ? Blood Pressure Monitoring (  ADULT BLOOD PRESSURE CUFF LG) KIT    ? dolutegravir (TIVICAY) 50 MG tablet TAKE 1 TABLET(50 MG) BY MOUTH TWICE DAILY  ? hydrochlorothiazide (HYDRODIURIL) 25 MG tablet TAKE 1 TABLET(25 MG) BY MOUTH DAILY  ? zidovudine (RETROVIR) 300 MG tablet Take 1 tablet (300 mg total) by mouth 2 (two) times daily.  ? ?Facility-Administered Medications Prior to Visit  ?Medication Dose Route Frequency Provider  ? ciprofloxacin (CIPRO) IVPB 400 mg  400 mg Intravenous Q12H Kathie Rhodes, MD  ? ?Allergies  ?Allergen Reactions  ? Penicillins Rash  ?  Has patient had a PCN reaction causing immediate rash, facial/tongue/throat swelling, SOB or lightheadedness with hypotension: No ?Has patient had a PCN reaction causing  severe rash involving mucus membranes or skin necrosis: No ?Has patient had a PCN reaction that required hospitalization No ?Has patient had a PCN reaction occurring within the last 10 years: No ?If all of the above answers are "NO", then may proceed with Cephalosporin use. ?  ? ?Immunization History  ?Administered Date(s) Administered  ? Hepatitis B 02/02/2013  ? Hepatitis B, adult 03/04/2013, 09/06/2013  ? Influenza Split 05/12/2011, 05/21/2012  ? Influenza Whole 05/09/2008, 05/15/2009, 04/25/2010  ? Influenza,inj,Quad PF,6+ Mos 06/02/2013, 06/13/2014, 05/24/2015, 03/27/2016, 05/11/2017, 05/10/2018, 05/09/2019  ? PFIZER Comirnaty(Gray Top)Covid-19 Tri-Sucrose Vaccine 02/19/2021  ? PFIZER(Purple Top)SARS-COV-2 Vaccination 09/29/2019, 10/29/2019, 05/28/2020  ? Pneumococcal Conjugate-13 05/10/2018  ? Pneumococcal Polysaccharide-23 05/02/2005, 09/12/2010, 10/13/2017  ? Td 04/27/2002  ? Tdap 12/02/2016  ? ? ?Review of Systems ?All review of systems negative except what is listed in the HPI ? ? Objective  ?  ?BP 128/88   Pulse 79   Ht '5\' 2"'  (1.575 m)   Wt 133 lb 6.4 oz (60.5 kg)   SpO2 99%   BMI 24.40 kg/m?  ?Physical Exam ?Vitals and nursing note reviewed.  ?Constitutional:   ?   Appearance: Normal appearance. She is normal weight.  ?HENT:  ?   Head: Normocephalic.  ?   Mouth/Throat:  ?   Mouth: Mucous membranes are moist.  ?   Dentition: Gingival swelling and dental caries present. No dental abscesses or gum lesions.  ?   Tongue: No lesions.  ?   Palate: No mass and lesions.  ?   Pharynx: Oropharynx is clear. Uvula midline.  ?Eyes:  ?   Extraocular Movements: Extraocular movements intact.  ?   Conjunctiva/sclera: Conjunctivae normal.  ?   Pupils: Pupils are equal, round, and reactive to light.  ?Cardiovascular:  ?   Rate and Rhythm: Normal rate and regular rhythm.  ?   Pulses: Normal pulses.  ?   Heart sounds: Normal heart sounds.  ?Pulmonary:  ?   Effort: Pulmonary effort is normal.  ?   Breath sounds: Normal  breath sounds.  ?Abdominal:  ?   General: Bowel sounds are normal.  ?   Palpations: Abdomen is soft.  ?Musculoskeletal:     ?   General: Normal range of motion.  ?   Cervical back: Normal range of motion.  ?Skin: ?   General: Skin is warm and dry.  ?   Capillary Refill: Capillary refill takes less than 2 seconds.  ?   Comments: Acral nummular verrucous appearing papule to the right hand. Consistent with common wart   ?Neurological:  ?   General: No focal deficit present.  ?   Mental Status: She is alert and oriented to person, place, and time.  ?Psychiatric:     ?   Mood and Affect: Mood  normal.     ?   Behavior: Behavior normal.     ?   Thought Content: Thought content normal.     ?   Judgment: Judgment normal.  ? ? ?No results found for any visits on 11/21/21. ? Assessment & Plan   ?  ? ?Problem List Items Addressed This Visit   ? ? HIV disease ([place])  ?  Chronic. Well controlled. Following with ID routinely. No alarm sx present at this time. Most recent labs reviewed. Recommend continue current regimen and avoid missed or skipped doses to lessen risk of tolerance. F/U if new or concerning sx present.  ? ?  ?  ? Relevant Medications  ? imiquimod (ALDARA) 5 % cream  ? HYPERTENSION, BENIGN  ?  Chronic. Well controlled today. Recent labs by ID reviewed. Will plan to continue current treatment and f/u with labs at next visit.  ? ?  ?  ? Dental caries - Primary  ?  Chronic presence without signs of abscess or drainage at this time. Recommend evaluation with dentist for further management. Will start treatment today with chlorhexidine mouth rinse to help remove excess bacteria and help prevent infection. Recommend brushing gently twice a day with fluoride containing toothpaste and flossing between each tooth. Rinse with chlorhexidine after brushing and avoid eating or drinking for at least 30 minutes.  ? ?  ?  ? Relevant Medications  ? chlorhexidine (PERIDEX) 0.12 % solution  ? imiquimod (ALDARA) 5 % cream  ? Other viral  warts  ?  Hand involvement with no alarm sx. Will send treatment today with topical medication. If no effect on symptoms, will refer to derm for further treatment.  ? ?  ?  ? Relevant Medications  ? imiquimod

## 2021-12-01 DIAGNOSIS — K029 Dental caries, unspecified: Secondary | ICD-10-CM | POA: Insufficient documentation

## 2021-12-01 DIAGNOSIS — B078 Other viral warts: Secondary | ICD-10-CM | POA: Insufficient documentation

## 2021-12-01 NOTE — Assessment & Plan Note (Signed)
Hand involvement with no alarm sx. Will send treatment today with topical medication. If no effect on symptoms, will refer to derm for further treatment.  ?

## 2021-12-01 NOTE — Assessment & Plan Note (Signed)
Chronic. Well controlled today. Recent labs by ID reviewed. Will plan to continue current treatment and f/u with labs at next visit.  ?

## 2021-12-01 NOTE — Assessment & Plan Note (Signed)
Chronic presence without signs of abscess or drainage at this time. Recommend evaluation with dentist for further management. Will start treatment today with chlorhexidine mouth rinse to help remove excess bacteria and help prevent infection. Recommend brushing gently twice a day with fluoride containing toothpaste and flossing between each tooth. Rinse with chlorhexidine after brushing and avoid eating or drinking for at least 30 minutes.  ?

## 2021-12-01 NOTE — Assessment & Plan Note (Signed)
Chronic. Well controlled. Following with ID routinely. No alarm sx present at this time. Most recent labs reviewed. Recommend continue current regimen and avoid missed or skipped doses to lessen risk of tolerance. F/U if new or concerning sx present.  ?

## 2021-12-03 ENCOUNTER — Other Ambulatory Visit: Payer: Self-pay | Admitting: Internal Medicine

## 2021-12-03 DIAGNOSIS — I10 Essential (primary) hypertension: Secondary | ICD-10-CM

## 2021-12-11 ENCOUNTER — Other Ambulatory Visit (HOSPITAL_BASED_OUTPATIENT_CLINIC_OR_DEPARTMENT_OTHER): Payer: Self-pay

## 2021-12-11 ENCOUNTER — Other Ambulatory Visit: Payer: Self-pay | Admitting: Internal Medicine

## 2021-12-11 ENCOUNTER — Other Ambulatory Visit: Payer: Self-pay | Admitting: Nurse Practitioner

## 2021-12-11 DIAGNOSIS — I10 Essential (primary) hypertension: Secondary | ICD-10-CM

## 2021-12-11 MED ORDER — AMLODIPINE BESYLATE 5 MG PO TABS: 5.0000 mg | ORAL_TABLET | Freq: Every day | ORAL | 11 refills | Status: DC

## 2022-01-05 ENCOUNTER — Other Ambulatory Visit: Payer: Self-pay | Admitting: Internal Medicine

## 2022-01-05 DIAGNOSIS — B20 Human immunodeficiency virus [HIV] disease: Secondary | ICD-10-CM

## 2022-01-06 ENCOUNTER — Other Ambulatory Visit: Payer: Self-pay

## 2022-01-06 ENCOUNTER — Other Ambulatory Visit: Payer: Self-pay | Admitting: Nurse Practitioner

## 2022-01-06 DIAGNOSIS — B20 Human immunodeficiency virus [HIV] disease: Secondary | ICD-10-CM

## 2022-01-06 DIAGNOSIS — I10 Essential (primary) hypertension: Secondary | ICD-10-CM

## 2022-01-06 MED ORDER — TIVICAY 50 MG PO TABS: ORAL_TABLET | ORAL | 1 refills | Status: DC

## 2022-01-06 MED ORDER — EVOTAZ 300-150 MG PO TABS: ORAL_TABLET | ORAL | 1 refills | Status: DC

## 2022-01-06 MED ORDER — ZIDOVUDINE 300 MG PO TABS: 300.0000 mg | ORAL_TABLET | Freq: Two times a day (BID) | ORAL | 1 refills | Status: DC

## 2022-01-07 ENCOUNTER — Other Ambulatory Visit: Payer: Self-pay | Admitting: Internal Medicine

## 2022-01-07 DIAGNOSIS — Z1231 Encounter for screening mammogram for malignant neoplasm of breast: Secondary | ICD-10-CM

## 2022-01-30 ENCOUNTER — Ambulatory Visit: Payer: Medicare HMO

## 2022-01-30 ENCOUNTER — Encounter: Payer: Self-pay | Admitting: Internal Medicine

## 2022-01-30 ENCOUNTER — Ambulatory Visit (INDEPENDENT_AMBULATORY_CARE_PROVIDER_SITE_OTHER): Payer: Medicare HMO | Admitting: Internal Medicine

## 2022-01-30 ENCOUNTER — Other Ambulatory Visit: Payer: Self-pay

## 2022-01-30 VITALS — BP 150/84 | HR 70 | Temp 98.2°F | Wt 130.0 lb

## 2022-01-30 DIAGNOSIS — Z79899 Other long term (current) drug therapy: Secondary | ICD-10-CM

## 2022-01-30 DIAGNOSIS — Z789 Other specified health status: Secondary | ICD-10-CM | POA: Diagnosis not present

## 2022-01-30 DIAGNOSIS — B20 Human immunodeficiency virus [HIV] disease: Secondary | ICD-10-CM

## 2022-01-30 DIAGNOSIS — I10 Essential (primary) hypertension: Secondary | ICD-10-CM

## 2022-01-30 MED ORDER — EVOTAZ 300-150 MG PO TABS: ORAL_TABLET | ORAL | 5 refills | Status: DC

## 2022-01-30 MED ORDER — TIVICAY 50 MG PO TABS: ORAL_TABLET | ORAL | 5 refills | Status: DC

## 2022-01-30 MED ORDER — ZIDOVUDINE 300 MG PO TABS: 300.0000 mg | ORAL_TABLET | Freq: Two times a day (BID) | ORAL | 5 refills | Status: DC

## 2022-01-31 NOTE — Progress Notes (Unsigned)
RFV: follow up for hiv disease  Patient ID: Veronica Robles, female   DOB: 08/13/1955, 66 y.o.   MRN: [id number]  HPI 66yo F with poorly controlled HIV disease,VL 16,000 (feb) HTN, ART regimen of tivicay bid, evotaz and retrovir who reports having difficulty with taking medications due to family stress and also worried about her family knowing about her underlying health problems. Has not disclosed her health with her brother who she lives with. She is still sad about the loss of family members in the past year. No other health problems that she reports.   Outpatient Encounter Medications as of 01/30/2022  Medication Sig   Blood Pressure Monitoring (ADULT BLOOD PRESSURE CUFF LG) KIT     chlorhexidine (PERIDEX) 0.12 % solution Swish with 32m in mouth twice a day and spit out.   hydrochlorothiazide (HYDRODIURIL) 25 MG tablet TAKE 1 TABLET(25 MG) BY MOUTH DAILY   hydrochlorothiazide (HYDRODIURIL) 25 MG tablet TAKE 1 TABLET(25 MG) BY MOUTH DAILY   [DISCONTINUED] atazanavir-cobicistat (EVOTAZ) 300-150 MG tablet Swallow whole. Do NOT crush, cut or chew tablet. Take with food.   [DISCONTINUED] dolutegravir (TIVICAY) 50 MG tablet TAKE 1 TABLET(50 MG) BY MOUTH TWICE DAILY   [DISCONTINUED] zidovudine (RETROVIR) 300 MG tablet Take 1 tablet (300 mg total) by mouth 2 (two) times daily.   amLODipine (NORVASC) 5 MG tablet Take 1 tablet (5 mg total) by mouth daily. (Patient not taking: Reported on 01/30/2022)   atazanavir-cobicistat (EVOTAZ) 300-150 MG tablet Swallow whole. Do NOT crush, cut or chew tablet. Take with food.   dolutegravir (TIVICAY) 50 MG tablet TAKE 1 TABLET(50 MG) BY MOUTH TWICE DAILY   imiquimod (ALDARA) 5 % cream Apply to wart on finger twice a day until wart is gone + 7 days. (Patient not taking: Reported on 01/30/2022)   zidovudine (RETROVIR) 300 MG tablet Take 1 tablet (300 mg total) by mouth 2 (two) times daily.   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  HENT: Negative for congestion, sore throat, rhinorrhea, sneezing, trouble swallowing and sinus pressure.  Eyes: Negative for photophobia and visual disturbance.  Respiratory: Negative for cough, chest tightness, shortness of breath, wheezing and stridor.  Cardiovascular: Negative for chest pain, palpitations and leg swelling.  Gastrointestinal: Negative for nausea, vomiting, abdominal pain, diarrhea, constipation, blood in stool, abdominal distention and anal bleeding.  Genitourinary: Negative for dysuria, hematuria, flank pain and difficulty urinating.  Musculoskeletal: Negative for myalgias, back pain, joint swelling, arthralgias and gait problem.  Skin: Negative for color change, pallor, rash and wound.  Neurological: Negative for dizziness, tremors, weakness and light-headedness.  Hematological: Negative for adenopathy. Does not bruise/bleed easily.  Psychiatric/Behavioral: Negative for behavioral problems, confusion, sleep disturbance, dysphoric mood, decreased concentration and  agitation.   Physical Exam   BP (!) 150/84   Pulse 70   Temp 98.2 F (36.8 C) (Oral)   Wt 130 lb (59 kg)   SpO2 100%   BMI 23.78 kg/m   Physical Exam  Constitutional:  oriented to person, place, and time. appears well-developed and well-nourished. No distress.  HENT: [place]/AT, PERRLA, no scleral icterus Mouth/Throat: Oropharynx is clear and moist. No oropharyngeal exudate.  Cardiovascular: Normal rate, regular rhythm and normal heart sounds. Exam reveals no gallop and no friction rub.  No murmur heard.  Pulmonary/Chest: Effort normal and breath sounds normal. No respiratory distress.  has no wheezes.  Neck = supple, no nuchal rigidity Lymphadenopathy: no cervical adenopathy. No axillary adenopathy Neurological: alert and oriented to person, place, and time.  Skin: Skin is warm and dry. No rash noted. No erythema.  Psychiatric: a normal mood and affect.  behavior is normal.   Lab Results  Component Value Date   CD4TCELL 19 (L) 09/17/2021   Lab Results  Component Value Date   CD4TABS 302 (L) 06/13/2021   CD4TABS 278 (L) 11/01/2020   CD4TABS 360 (L) 05/01/2020   Lab Results  Component Value Date   HIV1RNAQUANT 16,100 (H) 09/17/2021   Lab Results  Component Value Date   HEPBSAB INDETER (A) 08/08/2009   Lab Results  Component Value Date   LABRPR NON-REACTIVE 05/01/2020    CBC Lab Results  Component Value Date   WBC 5.4 06/13/2021   RBC 4.38 06/13/2021   HGB 13.6 06/13/2021   HCT 40.6 06/13/2021   PLT 194 06/13/2021   MCV 92.7 06/13/2021   MCH 31.1 06/13/2021   MCHC 33.5 06/13/2021   RDW 14.1 06/13/2021   LYMPHSABS 1,550 06/13/2021   MONOABS 0.6 08/28/2018   EOSABS 49 06/13/2021    BMET Lab Results  Component Value Date   NA 140 08/29/2021   K 4.0 08/29/2021   CL 102 08/29/2021   CO2 31 08/29/2021   GLUCOSE 95 08/29/2021   BUN 11 08/29/2021   CREATININE 1.02 08/29/2021   CALCIUM 9.7 08/29/2021   GFRNONAA 63 11/01/2020   GFRAA 73 11/01/2020     Assessment and Plan   HIV disease= will check labs today to see that she undetectable- but spent time 52mn on adherence counseling. If still not detectable may need to repeat genotype  Long term medication management = cr stable in feb 2023; aniticipate unchanged   Hypertension = on repeat, bp was at goal. Will not change her regimen for htn  Spent 45 min with patient with greater than 50% in face to face time addressing adherence counseling

## 2022-02-04 ENCOUNTER — Encounter: Payer: Self-pay | Admitting: Internal Medicine

## 2022-02-04 ENCOUNTER — Other Ambulatory Visit: Payer: Self-pay | Admitting: Nurse Practitioner

## 2022-02-04 DIAGNOSIS — I10 Essential (primary) hypertension: Secondary | ICD-10-CM

## 2022-02-10 ENCOUNTER — Ambulatory Visit (HOSPITAL_BASED_OUTPATIENT_CLINIC_OR_DEPARTMENT_OTHER): Payer: Medicare HMO | Admitting: Nurse Practitioner

## 2022-02-14 ENCOUNTER — Ambulatory Visit
Admission: RE | Admit: 2022-02-14 | Discharge: 2022-02-14 | Disposition: A | Discharge status: Home / Self Care | Payer: Medicare HMO | Source: Ambulatory Visit | Attending: Internal Medicine | Admitting: Internal Medicine

## 2022-02-14 DIAGNOSIS — Z1231 Encounter for screening mammogram for malignant neoplasm of breast: Secondary | ICD-10-CM

## 2022-02-18 ENCOUNTER — Other Ambulatory Visit: Payer: Self-pay | Admitting: Internal Medicine

## 2022-02-18 DIAGNOSIS — R928 Other abnormal and inconclusive findings on diagnostic imaging of breast: Secondary | ICD-10-CM

## 2022-02-25 DIAGNOSIS — C50919 Malignant neoplasm of unspecified site of unspecified female breast: Secondary | ICD-10-CM

## 2022-02-25 HISTORY — DX: Malignant neoplasm of unspecified site of unspecified female breast: C50.919

## 2022-02-26 ENCOUNTER — Other Ambulatory Visit: Payer: Self-pay | Admitting: Internal Medicine

## 2022-02-26 ENCOUNTER — Telehealth: Payer: Self-pay

## 2022-02-26 ENCOUNTER — Ambulatory Visit
Admission: RE | Admit: 2022-02-26 | Discharge: 2022-02-26 | Disposition: A | Discharge status: Home / Self Care | Payer: Medicare HMO | Source: Ambulatory Visit | Attending: Internal Medicine | Admitting: Internal Medicine

## 2022-02-26 DIAGNOSIS — R928 Other abnormal and inconclusive findings on diagnostic imaging of breast: Secondary | ICD-10-CM

## 2022-02-26 DIAGNOSIS — R599 Enlarged lymph nodes, unspecified: Secondary | ICD-10-CM

## 2022-02-26 DIAGNOSIS — N6320 Unspecified lump in the left breast, unspecified quadrant: Secondary | ICD-10-CM

## 2022-02-26 NOTE — Telephone Encounter (Signed)
Orders signed by Terri Piedra, NP and faxed to [facility].   Beryle Flock, RN

## 2022-02-26 NOTE — Telephone Encounter (Signed)
Received voicemail from Allison with [facility] stating that imaging orders must be signed before they can see patient for her appointment today.   Message sent to Dr. Baxter Flattery to request that she cosign orders.   P: [phone] F: [place], RN

## 2022-02-28 ENCOUNTER — Ambulatory Visit
Admission: RE | Admit: 2022-02-28 | Discharge: 2022-02-28 | Disposition: A | Discharge status: Home / Self Care | Payer: Medicare HMO | Source: Ambulatory Visit | Attending: Internal Medicine | Admitting: Internal Medicine

## 2022-02-28 DIAGNOSIS — N6320 Unspecified lump in the left breast, unspecified quadrant: Secondary | ICD-10-CM

## 2022-02-28 DIAGNOSIS — R599 Enlarged lymph nodes, unspecified: Secondary | ICD-10-CM

## 2022-03-04 ENCOUNTER — Telehealth: Payer: Self-pay | Admitting: Hematology and Oncology

## 2022-03-04 NOTE — Telephone Encounter (Signed)
LVM for patient to return call in reference to upcoming appointment for 8/16,packet will be mailed

## 2022-03-07 ENCOUNTER — Encounter: Payer: Self-pay | Admitting: *Deleted

## 2022-03-07 DIAGNOSIS — C50412 Malignant neoplasm of upper-outer quadrant of left female breast: Secondary | ICD-10-CM

## 2022-03-09 ENCOUNTER — Other Ambulatory Visit: Payer: Self-pay | Admitting: Nurse Practitioner

## 2022-03-09 DIAGNOSIS — I10 Essential (primary) hypertension: Secondary | ICD-10-CM

## 2022-03-10 ENCOUNTER — Telehealth: Payer: Self-pay

## 2022-03-10 NOTE — Telephone Encounter (Signed)
Called patient to confirm that she has breast biopsy scheduled, no answer. Left HIPAA compliant voicemail requesting callback.   Beryle Flock, RN

## 2022-03-10 NOTE — Telephone Encounter (Signed)
Patient returned call and confirmed appointment on 03/12/22 for breast biopsy

## 2022-03-10 NOTE — Telephone Encounter (Signed)
-----   Message from Carlyle Basques, MD sent at 03/08/2022 12:01 PM EDT ----- Regarding: [id number] up on results Hi Veronica Robles, can you check with [name] to see that they have arranged for biopsy of breast mass? Let her know I will check in with her when I get back. She had an abn mammo and ultrasound. [name] is covering for me this week . ----- Message ----- From: Interface, Rad Results In Sent: 02/26/2022   5:04 PM EDT To: Carlyle Basques, MD

## 2022-03-10 NOTE — Progress Notes (Signed)
Radiation Oncology         [phone] ________________________________  Name: BRISA AUTH        MRN: [id number]  Date of Service: 03/12/2022 DOB: 03/10/1956  [id number], Coralee Pesa, NP  [name], MD     REFERRING PHYSICIAN: [name], MD   DIAGNOSIS: The encounter diagnosis was Malignant neoplasm of upper-outer quadrant of left breast in female, estrogen receptor positive ([place]).   HISTORY OF PRESENT ILLNESS: Veronica Robles is a 66 y.o. female seen in the multidisciplinary breast clinic for a new diagnosis of left breast cancer. The patient was noted to have a possible mass in the left breast and left axillary adenopathy she underwent further diagnostic work-up which showed a mass in the 3 o'clock position of the left breast measuring 9 mm in greatest dimension and between the mass and the nipple there were numerous dilated ducts, asymmetric changes compared to other quadrants of the breast and the duct appeared to terminate near the spiculated mass however no intraductal mass was noted.  Within the left axilla there were 4 abnormal appearing lymph nodes the largest had cortical thickening of 7 mm.  She underwent a biopsy on 02/28/2022 of the breast and a axillary lymph node.  Within the breast invasive lobular carcinoma was seen with an overall grade of tumor.  The tumor was ER positive, PR negative, HER2 negative with a Ki-67 of 10%, and her left axillary lymph node was involved with metastatic carcinoma.  Given these findings she is seen today to discuss treatment recommendations of her cancer.Marland Kitchen    PREVIOUS RADIATION THERAPY: {EXAM; YES/NO:19492::"No"}   PAST MEDICAL HISTORY:  Past Medical History:  Diagnosis Date   HIV infection ([place])    Hypertension        PAST SURGICAL HISTORY: Past Surgical History:  Procedure Laterality Date   ABDOMINAL HYSTERECTOMY     APPENDECTOMY     BREAST BIOPSY Right 2015   CYSTOSCOPY/RETROGRADE/URETEROSCOPY Right 11/14/2016    Procedure: CYSTOSCOPY/RETROGRADE/URETEROSCOPY/ STONE EXTRACTION/HOLMIUM LASER/STENT PLACEMENT;  Surgeon: Kathie Rhodes, MD;  Location: [facility];  Service: Urology;  Laterality: Right;     FAMILY HISTORY:  Family History  Problem Relation Age of Onset   Hypertension Mother    Cancer Father        pancreatic   Breast cancer Sister      SOCIAL HISTORY:  reports that she has quit smoking. Her smoking use included cigarettes. She smoked an average of .1 packs per day. She has never used smokeless tobacco. She reports that she does not drink alcohol and does not use drugs.  The patient is single and lives in [place]. She ***   ALLERGIES: Penicillins   MEDICATIONS:  Current Outpatient Medications  Medication Sig Dispense Refill   amLODipine (NORVASC) 5 MG tablet Take 1 tablet (5 mg total) by mouth daily. (Patient not taking: Reported on 01/30/2022) 30 tablet 11   atazanavir-cobicistat (EVOTAZ) 300-150 MG tablet Swallow whole. Do NOT crush, cut or chew tablet. Take with food. 30 tablet 5   Blood Pressure Monitoring (ADULT BLOOD PRESSURE CUFF LG) KIT       chlorhexidine (PERIDEX) 0.12 % solution Swish with 45m in mouth twice a day and spit out. 473 mL 1   dolutegravir (TIVICAY) 50 MG tablet TAKE 1 TABLET(50 MG) BY MOUTH TWICE DAILY 60 tablet 5   hydrochlorothiazide (HYDRODIURIL) 25 MG tablet TAKE 1 TABLET(25 MG) BY MOUTH DAILY 90 tablet 3   hydrochlorothiazide (HYDRODIURIL) 25 MG tablet TAKE 1  TABLET(25 MG) BY MOUTH DAILY 30 tablet 0   imiquimod (ALDARA) 5 % cream Apply to wart on finger twice a day until wart is gone + 7 days. (Patient not taking: Reported on 01/30/2022) 24 each 3   zidovudine (RETROVIR) 300 MG tablet Take 1 tablet (300 mg total) by mouth 2 (two) times daily. 60 tablet 5   No current facility-administered medications for this visit.   Facility-Administered Medications Ordered in Other Visits  Medication Dose Route Frequency Provider Last Rate Last Admin   ciprofloxacin  (CIPRO) IVPB 400 mg  400 mg Intravenous Q12H Kathie Rhodes, MD   400 mg at 11/14/16 0844     REVIEW OF SYSTEMS: On review of systems, the patient reports that she is doing ***     PHYSICAL EXAM:  Wt Readings from Last 3 Encounters:  01/30/22 130 lb (59 kg)  11/21/21 133 lb 6.4 oz (60.5 kg)  09/17/21 133 lb (60.3 kg)   Temp Readings from Last 3 Encounters:  01/30/22 98.2 F (36.8 C) (Oral)  09/17/21 98.9 F (37.2 C) (Oral)  06/13/21 (!) 97.1 F (36.2 C) (Temporal)   BP Readings from Last 3 Encounters:  01/30/22 (!) 150/84  11/21/21 128/88  09/17/21 (!) 172/95   Pulse Readings from Last 3 Encounters:  01/30/22 70  11/21/21 79  09/17/21 80    In general this is a well appearing African-American female in no acute distress. She's alert and oriented x4 and appropriate throughout the examination. Cardiopulmonary assessment is negative for acute distress and she exhibits normal effort. Bilateral breast exam is deferred.    ECOG = ***  0 - Asymptomatic (Fully active, able to carry on all predisease activities without restriction)  1 - Symptomatic but completely ambulatory (Restricted in physically strenuous activity but ambulatory and able to carry out work of a light or sedentary nature. For example, light housework, office work)  2 - Symptomatic, <50% in bed during the day (Ambulatory and capable of all self care but unable to carry out any work activities. Up and about more than 50% of waking hours)  3 - Symptomatic, >50% in bed, but not bedbound (Capable of only limited self-care, confined to bed or chair 50% or more of waking hours)  4 - Bedbound (Completely disabled. Cannot carry on any self-care. Totally confined to bed or chair)  5 - Death   Eustace Pen MM, Creech RH, Tormey DC, et al. [phone]). "Toxicity and response criteria of the [facility]". [place] Oncol. 5 (6): 649-55    LABORATORY DATA:  Lab Results  Component Value Date   WBC 5.4  06/13/2021   HGB 13.6 06/13/2021   HCT 40.6 06/13/2021   MCV 92.7 06/13/2021   PLT 194 06/13/2021   Lab Results  Component Value Date   NA 140 08/29/2021   K 4.0 08/29/2021   CL 102 08/29/2021   CO2 31 08/29/2021   Lab Results  Component Value Date   ALT 16 11/01/2020   AST 28 11/01/2020   ALKPHOS 62 08/28/2018   BILITOT 0.6 11/01/2020      RADIOGRAPHY: [place] LT BREAST BX W LOC DEV 1ST LESION IMG BX SPEC US GUIDE  Addendum Date: 03/06/2022   ADDENDUM REPORT: 03/06/2022 08:04 ADDENDUM: Pathology revealed GRADE II INVASIVE MAMMARY CARCINOMA WITH MORPHOLOGIC APPEARANCE OF INVASIVE LOBULAR CARCINOMA of the LEFT breast, 3 o'clock, 8cmfn, (ribbon clip). This was found to be concordant by Dr. Lovey Newcomer. Pathology revealed METASTATIC CARCINOMA WITH MORPHOLOGIC FEATURES SIMILAR  TO THE ONES PRESENT IN SPECIMEN 1, CONSISTENT WITH ALMOST TOTAL REPLACEMENT OF LYMPH NODE BY CARCINOMA of the LEFT axilla, (hydromark clip). This was found to be concordant by Dr. Lovey Newcomer. Pathology results were discussed with the patient by telephone. The patient reported doing well after the biopsies with tenderness and bruising at the sites, axilla more than breast. Post biopsy instructions and care were reviewed and questions were answered. The patient was encouraged to call [facility] for any additional concerns. My direct phone number was provided. The patient was referred to [facility] on March 12, 2022. Recommendation for a bilateral breast MRI for further characterization of extent of disease, per previous diagnostic report. Pathology results reported by Terie Purser, RN on 03/03/2022. Electronically Signed   By: Lovey Newcomer M.D.   On: 03/06/2022 08:04   Result Date: 03/06/2022 CLINICAL DATA:  Patient with indeterminate left breast mass and left axillary lymph node EXAM: ULTRASOUND GUIDED LEFT BREAST CORE NEEDLE  BIOPSY COMPARISON:  Previous exam(s). PROCEDURE: I met with the patient and we discussed the procedure of ultrasound-guided biopsy, including benefits and alternatives. We discussed the high likelihood of a successful procedure. We discussed the risks of the procedure, including infection, bleeding, tissue injury, clip migration, and inadequate sampling. Informed written consent was given. The usual time-out protocol was performed immediately prior to the procedure. Site 1: Left breast 3 o'clock position Lesion quadrant: Upper outer quadrant Using sterile technique and 1% Lidocaine as local anesthetic, under direct ultrasound visualization, a 14 gauge spring-loaded device was used to perform biopsy of left breast mass 3 o'clock position using a lateral approach. At the conclusion of the procedure ribbon shaped tissue marker clip was deployed into the biopsy cavity. Follow up 2 view mammogram was performed and dictated separately. Site 2: Left axillary node Lesion quadrant: Upper outer quadrant Using sterile technique and 1% Lidocaine as local anesthetic, under direct ultrasound visualization, a 14 gauge spring-loaded device was used to perform biopsy of left axillary node using a lateral approach. At the conclusion of the procedure [facility] shaped tissue marker clip was deployed into the biopsy cavity. Follow up 2 view mammogram was performed and dictated separately. IMPRESSION: Ultrasound guided biopsy of left breast mass 3 o'clock position and left axillary node. No apparent complications. Electronically Signed: By: Lovey Newcomer M.D. On: 02/28/2022 13:39   [place] AXILLARY NODE CORE BIOPSY LEFT  Addendum Date: 03/06/2022   ADDENDUM REPORT: 03/06/2022 08:04 ADDENDUM: Pathology revealed GRADE II INVASIVE MAMMARY CARCINOMA WITH MORPHOLOGIC APPEARANCE OF INVASIVE LOBULAR CARCINOMA of the LEFT breast, 3 o'clock, 8cmfn, (ribbon clip). This was found to be concordant by Dr. Lovey Newcomer. Pathology revealed METASTATIC  CARCINOMA WITH MORPHOLOGIC FEATURES SIMILAR TO THE ONES PRESENT IN SPECIMEN 1, CONSISTENT WITH ALMOST TOTAL REPLACEMENT OF LYMPH NODE BY CARCINOMA of the LEFT axilla, (hydromark clip). This was found to be concordant by Dr. Lovey Newcomer. Pathology results were discussed with the patient by telephone. The patient reported doing well after the biopsies with tenderness and bruising at the sites, axilla more than breast. Post biopsy instructions and care were reviewed and questions were answered. The patient was encouraged to call [facility] for any additional concerns. My direct phone number was provided. The patient was referred to [facility] at [facility] on March 12, 2022. Recommendation for a bilateral breast MRI for further  characterization of extent of disease, per previous diagnostic report. Pathology results reported by Terie Purser, RN on 03/03/2022. Electronically Signed   By: Lovey Newcomer M.D.   On: 03/06/2022 08:04   Result Date: 03/06/2022 CLINICAL DATA:  Patient with indeterminate left breast mass and left axillary lymph node EXAM: ULTRASOUND GUIDED LEFT BREAST CORE NEEDLE BIOPSY COMPARISON:  Previous exam(s). PROCEDURE: I met with the patient and we discussed the procedure of ultrasound-guided biopsy, including benefits and alternatives. We discussed the high likelihood of a successful procedure. We discussed the risks of the procedure, including infection, bleeding, tissue injury, clip migration, and inadequate sampling. Informed written consent was given. The usual time-out protocol was performed immediately prior to the procedure. Site 1: Left breast 3 o'clock position Lesion quadrant: Upper outer quadrant Using sterile technique and 1% Lidocaine as local anesthetic, under direct ultrasound visualization, a 14 gauge spring-loaded device was used to perform biopsy of left breast mass 3 o'clock position using a  lateral approach. At the conclusion of the procedure ribbon shaped tissue marker clip was deployed into the biopsy cavity. Follow up 2 view mammogram was performed and dictated separately. Site 2: Left axillary node Lesion quadrant: Upper outer quadrant Using sterile technique and 1% Lidocaine as local anesthetic, under direct ultrasound visualization, a 14 gauge spring-loaded device was used to perform biopsy of left axillary node using a lateral approach. At the conclusion of the procedure [facility] shaped tissue marker clip was deployed into the biopsy cavity. Follow up 2 view mammogram was performed and dictated separately. IMPRESSION: Ultrasound guided biopsy of left breast mass 3 o'clock position and left axillary node. No apparent complications. Electronically Signed: By: Lovey Newcomer M.D. On: 02/28/2022 13:39   MM CLIP PLACEMENT LEFT  Result Date: 02/28/2022 CLINICAL DATA:  Status post ultrasound biopsy left breast mass and left axillary lymph node EXAM: 3D DIAGNOSTIC LEFT MAMMOGRAM POST ULTRASOUND BIOPSY COMPARISON:  Previous exam(s). FINDINGS: 3D Mammographic images were obtained following ultrasound guided biopsy of left breast mass and left axillary node. Site 1: Left breast mass 3 o'clock position: Ribbon shaped clip: In appropriate position Site 2: Left axillary node: HydroMARK clip: In appropriate position. IMPRESSION: Appropriate positioning of the biopsy marking clips as above. Final Assessment: Post Procedure Mammograms for Marker Placement Electronically Signed   By: Lovey Newcomer M.D.   On: 02/28/2022 14:08  MM DIAG BREAST TOMO UNI LEFT  Result Date: 02/26/2022 CLINICAL DATA:  Patient returns after screening study for evaluation possible LEFT breast mass possible LEFT axillary lymphadenopathy. Patient reports a palpable mass in the LEFT axilla. EXAM: DIGITAL DIAGNOSTIC UNILATERAL LEFT MAMMOGRAM WITH TOMOSYNTHESIS; ULTRASOUND LEFT BREAST LIMITED TECHNIQUE: Left digital diagnostic mammography  and breast tomosynthesis was performed.; Targeted ultrasound examination of the left breast was performed. COMPARISON:  Previous exam(s). ACR Breast Density Category 02/14/2022 and earlier FINDINGS: Additional 2-D and 3-D images are performed. These views confirm presence of a spiculated mass in the posterior LATERAL portion of LEFT breast. On physical exam, I palpate no abnormality in the 3 o'clock location of the LEFT breast. I palpate a discrete mobile mass in the central LEFT axilla. Targeted ultrasound is performed, showing an oval mass with spiculated and angular margins in the 3 o'clock location of the LEFT breast 8 centimeters from the nipple measuring 0.7 x 0.9 x 0.6 centimeters. Between the mass and the nipple, there are numerous dilated ducts, asymmetric compared to other quadrants of the breast. The ducts appear to terminate near the spiculated mass. However, no  intraductal mass is identified. Within the LEFT axilla, there are 4 lymph nodes with abnormal morphology. The largest of these has cortical thickening of 7 millimeters. Other more superior lymph nodes have normal morphology. IMPRESSION: 1. Suspicious 0.9 centimeter mass in the 3 o'clock location of the LEFT breast warranting tissue diagnosis. 2. Asymmetric dilated ducts interposed between the mass and the LEFT nipple, suspicious for ductal carcinoma in situ. No intraductal mass is identified to target biopsy however. 3. There are 4 enlarged LEFT axillary lymph nodes. Largest of these has cortical thickening of 7 millimeters. RECOMMENDATION: 1. Recommend ultrasound-guided core biopsy of mass in the 3 o'clock location of the LEFT breast. 2. Recommend ultrasound-guided core biopsy of enlarged LEFT axillary lymph node. 3. 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 I have discussed the findings and recommendations with the  patient. If applicable, a reminder letter will be sent to the patient regarding the next appointment. BI-RADS CATEGORY  5: Highly suggestive of malignancy. Electronically Signed   By: Nolon Nations M.D.   On: 02/26/2022 15:04  US BREAST LTD UNI LEFT INC AXILLA  Result Date: 02/26/2022 CLINICAL DATA:  Patient returns after screening study for evaluation possible LEFT breast mass possible LEFT axillary lymphadenopathy. Patient reports a palpable mass in the LEFT axilla. EXAM: DIGITAL DIAGNOSTIC UNILATERAL LEFT MAMMOGRAM WITH TOMOSYNTHESIS; ULTRASOUND LEFT BREAST LIMITED TECHNIQUE: Left digital diagnostic mammography and breast tomosynthesis was performed.; Targeted ultrasound examination of the left breast was performed. COMPARISON:  Previous exam(s). ACR Breast Density Category 02/14/2022 and earlier FINDINGS: Additional 2-D and 3-D images are performed. These views confirm presence of a spiculated mass in the posterior LATERAL portion of LEFT breast. On physical exam, I palpate no abnormality in the 3 o'clock location of the LEFT breast. I palpate a discrete mobile mass in the central LEFT axilla. Targeted ultrasound is performed, showing an oval mass with spiculated and angular margins in the 3 o'clock location of the LEFT breast 8 centimeters from the nipple measuring 0.7 x 0.9 x 0.6 centimeters. Between the mass and the nipple, there are numerous dilated ducts, asymmetric compared to other quadrants of the breast. The ducts appear to terminate near the spiculated mass. However, no intraductal mass is identified. Within the LEFT axilla, there are 4 lymph nodes with abnormal morphology. The largest of these has cortical thickening of 7 millimeters. Other more superior lymph nodes have normal morphology. IMPRESSION: 1. Suspicious 0.9 centimeter mass in the 3 o'clock location of the LEFT breast warranting tissue diagnosis. 2. Asymmetric dilated ducts interposed between the mass and the LEFT nipple, suspicious  for ductal carcinoma in situ. No intraductal mass is identified to target biopsy however. 3. There are 4 enlarged LEFT axillary lymph nodes. Largest of these has cortical thickening of 7 millimeters. RECOMMENDATION: 1. Recommend ultrasound-guided core biopsy of mass in the 3 o'clock location of the LEFT breast. 2. Recommend ultrasound-guided core biopsy of enlarged LEFT axillary lymph node. 3. 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 I have discussed the findings and recommendations with the patient. If applicable, a reminder letter will be sent to the patient regarding the next appointment. BI-RADS CATEGORY  5: Highly suggestive of malignancy. Electronically Signed   By: Nolon Nations M.D.   On: 02/26/2022 15:04  MM 3D SCREEN BREAST BILATERAL  Addendum Date: 02/26/2022   ADDENDUM REPORT: 02/26/2022 13:05 ADDENDUM: Corrected report: The findings are within the LEFT breast and LEFT axilla. The RIGHT breast and visualized RIGHT axilla are negative. Electronically Signed   By: Nolon Nations M.D.   On: 02/26/2022 13:05   Result Date: 02/26/2022 CLINICAL DATA:  Screening. EXAM: DIGITAL SCREENING BILATERAL MAMMOGRAM WITH TOMOSYNTHESIS AND CAD TECHNIQUE: Bilateral screening digital craniocaudal and mediolateral oblique mammograms were obtained. Bilateral screening digital breast tomosynthesis was performed. The images were evaluated with computer-aided detection. COMPARISON:  Previous exam(s). ACR Breast Density Category b: There are scattered areas of fibroglandular density. FINDINGS: In the right breast, a possible mass warrants further evaluation. Additional imaging evaluation of right axillary adenopathy is recommended. In the left breast, no findings suspicious for malignancy. IMPRESSION: Further evaluation is suggested for a possible mass in the right breast and right axillary  adenopathy. RECOMMENDATION: Diagnostic mammogram and possibly ultrasound of the right breast. (Code:[id number]) The patient will be contacted regarding the findings, and additional imaging will be scheduled. BI-RADS CATEGORY  0: Incomplete. Need additional imaging evaluation and/or prior mammograms for comparison. Electronically Signed: By: Lillia Mountain M.D. On: 02/17/2022 14:56      IMPRESSION/PLAN: 1. Stage IIA, [id number], grade 3, ER positive invasive lobular carcinoma of the left breast. Dr. Lisbeth Renshaw discusses the pathology findings and reviews the nature of node positive left breast disease. The consensus from the breast conference includes breast conservation with lumpectomy with targeted node biopsy***. Dr. Lindi Adie recommends Mammaprint*** score to determine a role for systemic therapy. Provided that chemotherapy is not indicated, the patient's course would then be followed by external radiotherapy to the breast  to reduce risks of local recurrence followed by antiestrogen therapy. We discussed the risks, benefits, short, and long term effects of radiotherapy, as well as the curative intent, and the patient is interested in proceeding. Dr. Lisbeth Renshaw discusses the delivery and logistics of radiotherapy and anticipates a course of 6 1/2 weeks of radiotherapy to the left breast with deep inspiration breath-hold technique. We will see her back a few weeks after surgery to discuss the simulation process and anticipate we starting radiotherapy about 4-6 weeks after surgery.  2. Possible genetic predisposition to malignancy. The patient is a candidate for genetic testing given her personal and family history. She was offered referral and will meet genetics today in clinic.  In a visit lasting *** minutes, greater than 50% of the time was spent face to face reviewing her case, as well as in preparation of, discussing, and coordinating the patient's care.  The above documentation reflects my direct findings during this  shared patient visit. Please see the separate note by Dr. Lisbeth Renshaw on this date for the remainder of the patient's plan of care.    Carola Rhine, [facility]    **Disclaimer: This note was dictated with voice recognition software. Similar sounding words can inadvertently be transcribed and this note may contain transcription errors which may not have been corrected upon publication of note.**

## 2022-03-12 ENCOUNTER — Encounter: Payer: Self-pay | Admitting: General Practice

## 2022-03-12 ENCOUNTER — Encounter: Payer: Self-pay | Admitting: Hematology and Oncology

## 2022-03-12 ENCOUNTER — Ambulatory Visit
Admission: RE | Admit: 2022-03-12 | Discharge: 2022-03-12 | Disposition: A | Discharge status: Home / Self Care | Payer: Medicare HMO | Source: Ambulatory Visit | Attending: Radiation Oncology | Admitting: Radiation Oncology

## 2022-03-12 ENCOUNTER — Encounter: Payer: Self-pay | Admitting: Physical Therapy

## 2022-03-12 ENCOUNTER — Other Ambulatory Visit (HOSPITAL_BASED_OUTPATIENT_CLINIC_OR_DEPARTMENT_OTHER): Payer: Self-pay

## 2022-03-12 ENCOUNTER — Inpatient Hospital Stay: Payer: Medicare HMO | Attending: Hematology and Oncology | Admitting: Hematology and Oncology

## 2022-03-12 ENCOUNTER — Encounter: Payer: Self-pay | Admitting: *Deleted

## 2022-03-12 ENCOUNTER — Inpatient Hospital Stay: Payer: Medicare HMO

## 2022-03-12 ENCOUNTER — Other Ambulatory Visit: Payer: Self-pay | Admitting: Surgery

## 2022-03-12 ENCOUNTER — Ambulatory Visit: Payer: Medicare HMO | Attending: Surgery | Admitting: Physical Therapy

## 2022-03-12 ENCOUNTER — Inpatient Hospital Stay (HOSPITAL_BASED_OUTPATIENT_CLINIC_OR_DEPARTMENT_OTHER): Payer: Medicare HMO | Admitting: Genetic Counselor

## 2022-03-12 ENCOUNTER — Other Ambulatory Visit: Payer: Self-pay

## 2022-03-12 VITALS — BP 136/82 | HR 72 | Temp 97.7°F | Resp 18 | Ht 62.0 in | Wt 128.9 lb

## 2022-03-12 DIAGNOSIS — Z803 Family history of malignant neoplasm of breast: Secondary | ICD-10-CM

## 2022-03-12 DIAGNOSIS — Z5111 Encounter for antineoplastic chemotherapy: Secondary | ICD-10-CM | POA: Insufficient documentation

## 2022-03-12 DIAGNOSIS — C50412 Malignant neoplasm of upper-outer quadrant of left female breast: Secondary | ICD-10-CM

## 2022-03-12 DIAGNOSIS — Z880 Allergy status to penicillin: Secondary | ICD-10-CM | POA: Insufficient documentation

## 2022-03-12 DIAGNOSIS — N6325 Unspecified lump in the left breast, overlapping quadrants: Secondary | ICD-10-CM | POA: Diagnosis not present

## 2022-03-12 DIAGNOSIS — Z9049 Acquired absence of other specified parts of digestive tract: Secondary | ICD-10-CM | POA: Insufficient documentation

## 2022-03-12 DIAGNOSIS — R590 Localized enlarged lymph nodes: Secondary | ICD-10-CM | POA: Insufficient documentation

## 2022-03-12 DIAGNOSIS — N6489 Other specified disorders of breast: Secondary | ICD-10-CM | POA: Diagnosis not present

## 2022-03-12 DIAGNOSIS — R293 Abnormal posture: Secondary | ICD-10-CM | POA: Diagnosis present

## 2022-03-12 DIAGNOSIS — Z170 Estrogen receptor positive status [ER+]: Secondary | ICD-10-CM | POA: Insufficient documentation

## 2022-03-12 DIAGNOSIS — Z87891 Personal history of nicotine dependence: Secondary | ICD-10-CM

## 2022-03-12 DIAGNOSIS — K219 Gastro-esophageal reflux disease without esophagitis: Secondary | ICD-10-CM | POA: Insufficient documentation

## 2022-03-12 DIAGNOSIS — Z79899 Other long term (current) drug therapy: Secondary | ICD-10-CM | POA: Diagnosis not present

## 2022-03-12 DIAGNOSIS — Z79624 Long term (current) use of inhibitors of nucleotide synthesis: Secondary | ICD-10-CM | POA: Insufficient documentation

## 2022-03-12 DIAGNOSIS — Z8249 Family history of ischemic heart disease and other diseases of the circulatory system: Secondary | ICD-10-CM

## 2022-03-12 DIAGNOSIS — R634 Abnormal weight loss: Secondary | ICD-10-CM | POA: Insufficient documentation

## 2022-03-12 DIAGNOSIS — I10 Essential (primary) hypertension: Secondary | ICD-10-CM | POA: Insufficient documentation

## 2022-03-12 DIAGNOSIS — Z800 Family history of malignant neoplasm of digestive organs: Secondary | ICD-10-CM

## 2022-03-12 DIAGNOSIS — Z801 Family history of malignant neoplasm of trachea, bronchus and lung: Secondary | ICD-10-CM

## 2022-03-12 LAB — CBC WITH DIFFERENTIAL (CANCER CENTER ONLY)
Abs Immature Granulocytes: 0.01 10*3/uL (ref 0.00–0.07)
Basophils Absolute: 0 10*3/uL (ref 0.0–0.1)
Basophils Relative: 0 %
Eosinophils Absolute: 0 10*3/uL (ref 0.0–0.5)
Eosinophils Relative: 0 %
HCT: 38.6 % (ref 36.0–46.0)
Hemoglobin: 12.8 g/dL (ref 12.0–15.0)
Immature Granulocytes: 0 %
Lymphocytes Relative: 28 %
Lymphs Abs: 1.4 10*3/uL (ref 0.7–4.0)
MCH: 29.2 pg (ref 26.0–34.0)
MCHC: 33.2 g/dL (ref 30.0–36.0)
MCV: 88.1 fL (ref 80.0–100.0)
Monocytes Absolute: 0.6 10*3/uL (ref 0.1–1.0)
Monocytes Relative: 11 %
Neutro Abs: 2.9 10*3/uL (ref 1.7–7.7)
Neutrophils Relative %: 61 %
Platelet Count: 154 10*3/uL (ref 150–400)
RBC: 4.38 MIL/uL (ref 3.87–5.11)
RDW: 13.5 % (ref 11.5–15.5)
WBC Count: 4.9 10*3/uL (ref 4.0–10.5)
nRBC: 0 % (ref 0.0–0.2)

## 2022-03-12 LAB — CMP (CANCER CENTER ONLY)
ALT: 17 U/L (ref 0–44)
AST: 25 U/L (ref 15–41)
Albumin: 4.2 g/dL (ref 3.5–5.0)
Alkaline Phosphatase: 61 U/L (ref 38–126)
Anion gap: 6 (ref 5–15)
BUN: 11 mg/dL (ref 8–23)
CO2: 30 mmol/L (ref 22–32)
Calcium: 9.8 mg/dL (ref 8.9–10.3)
Chloride: 106 mmol/L (ref 98–111)
Creatinine: 0.97 mg/dL (ref 0.44–1.00)
GFR, Estimated: >60 mL/min (ref >60)
Glucose, Bld: 100 mg/dL — ABNORMAL HIGH (ref 70–99)
Potassium: 3.8 mmol/L (ref 3.5–5.1)
Sodium: 142 mmol/L (ref 135–145)
Total Bilirubin: 0.7 mg/dL (ref 0.3–1.2)
Total Protein: 8.1 g/dL (ref 6.5–8.1)

## 2022-03-12 LAB — GENETIC SCREENING ORDER

## 2022-03-12 MED ORDER — ONDANSETRON HCL 8 MG PO TABS
ORAL_TABLET | ORAL | 1 refills | Status: DC
  Filled 2022-03-12: qty 30, 10d supply, fill #0
  Filled 2022-06-20: qty 30, 10d supply, fill #1

## 2022-03-12 MED ORDER — LIDOCAINE-PRILOCAINE 2.5-2.5 % EX CREA
TOPICAL_CREAM | CUTANEOUS | 3 refills | Status: DC
  Filled 2022-03-12: qty 30, 10d supply, fill #0

## 2022-03-12 MED ORDER — PROCHLORPERAZINE MALEATE 10 MG PO TABS
10.0000 mg | ORAL_TABLET | Freq: Four times a day (QID) | ORAL | 1 refills | Status: DC | PRN
  Filled 2022-03-12: qty 30, 8d supply, fill #0

## 2022-03-12 MED ORDER — DEXAMETHASONE 4 MG PO TABS
ORAL_TABLET | ORAL | 0 refills | Status: DC
  Filled 2022-03-12: qty 8, 4d supply, fill #0

## 2022-03-12 NOTE — Progress Notes (Signed)
[place] CONSULT NOTE  Patient Care Team: Early, Coralee Pesa, NP as PCP - General (Nurse Practitioner) Carlyle Basques, MD as PCP - Infectious Diseases (Infectious Diseases) Rockwell Germany, RN as Oncology Nurse Navigator Mauro Kaufmann, RN as Oncology Nurse Navigator  CHIEF COMPLAINTS/PURPOSE OF CONSULTATION:  Newly diagnosed breast cancer  HISTORY OF PRESENTING ILLNESS:  Veronica Robles 66 y.o. female is here because of recent diagnosis of left breast cancer.  Patient had a routine screening mammogram that detected left breast mass measuring 0.9 cm.  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  There was also an enlarged lymph node on biopsy came back positive.  Ultrasound revealed for enlarged lymph nodes.  She was presented this morning to the multidisciplinary tumor board and she is here today to discuss her treatment plan accompanied by her son.  I reviewed her records extensively and collaborated the history with the patient.  SUMMARY OF ONCOLOGIC HISTORY: Oncology History  Malignant neoplasm of upper-outer quadrant of left breast in female, estrogen receptor positive ([place])  02/28/2022 Initial Diagnosis   Screening detected left breast mass at 3 o'clock position measuring 0.9 cm, 4 enlarged left axillary lymph nodes along with asymmetric ducts between mass and the nipple possibly DCIS; lymph node biopsy: Positive, breast biopsy: Grade 2 IDC ER 100%, PR 0%, HER2 negative, Ki-67 10%   03/10/2022 Cancer Staging   Staging form: Breast, AJCC 8th Edition - Clinical stage from 03/10/2022: Stage IIA (cT1b, cN1(f), cM0, G2, ER+, PR-, HER2-) - Signed by Hayden Pedro, PA-C on 03/10/2022 Stage prefix: Initial diagnosis Method of lymph node assessment: Core biopsy Histologic grading system: 3 grade system      MEDICAL HISTORY:  Past Medical History:  Diagnosis Date    Breast cancer ()    HIV infection ([place])    Hypertension     SURGICAL HISTORY: Past Surgical History:  Procedure Laterality Date   ABDOMINAL HYSTERECTOMY     APPENDECTOMY     BREAST BIOPSY Right 2015   CYSTOSCOPY/RETROGRADE/URETEROSCOPY Right 11/14/2016   Procedure: CYSTOSCOPY/RETROGRADE/URETEROSCOPY/ STONE EXTRACTION/HOLMIUM LASER/STENT PLACEMENT;  Surgeon: Kathie Rhodes, MD;  Location: [facility];  Service: Urology;  Laterality: Right;    SOCIAL HISTORY: Social History   Socioeconomic History   Marital status: Single    Spouse name: Not on file   Number of children: Not on file   Years of education: Not on file   Highest education level: Not on file  Occupational History   Not on file  Tobacco Use   Smoking status: Former    Packs/day: 0.10    Types: Cigarettes   Smokeless tobacco: Never  Vaping Use   Vaping Use: Never used  Substance and Sexual Activity   Alcohol use: No    Alcohol/week: 0.0 standard drinks of alcohol   Drug use: No   Sexual activity: Not Currently    Partners: Male    Birth control/protection: Surgical    Comment: declined condoms  Other Topics Concern   Not on file  Social History Narrative   Not on file   Social Determinants of Health   Financial Resource Strain: Not on file  Food Insecurity: Not on file  Transportation Needs: Not on file  Physical Activity: Not on file  Stress: Not on file  Social Connections: Not on file  Intimate Partner Violence: Not on file  FAMILY HISTORY: Family History  Problem Relation Age of Onset   Hypertension Mother    Cancer Father        pancreatic   Breast cancer Sister    Lung cancer Sister     ALLERGIES:  is allergic to penicillins.  MEDICATIONS:  Current Outpatient Medications  Medication Sig Dispense Refill   atazanavir-cobicistat (EVOTAZ) 300-150 MG tablet Swallow whole. Do NOT crush, cut or chew tablet. Take with food. 30 tablet 5   Blood Pressure Monitoring (ADULT BLOOD PRESSURE CUFF  LG) KIT       dolutegravir (TIVICAY) 50 MG tablet TAKE 1 TABLET(50 MG) BY MOUTH TWICE DAILY 60 tablet 5   hydrochlorothiazide (HYDRODIURIL) 25 MG tablet TAKE 1 TABLET(25 MG) BY MOUTH DAILY 90 tablet 3   hydrochlorothiazide (HYDRODIURIL) 25 MG tablet TAKE 1 TABLET(25 MG) BY MOUTH DAILY 30 tablet 0   zidovudine (RETROVIR) 300 MG tablet Take 1 tablet (300 mg total) by mouth 2 (two) times daily. 60 tablet 5   No current facility-administered medications for this visit.   Facility-Administered Medications Ordered in Other Visits  Medication Dose Route Frequency Provider Last Rate Last Admin   ciprofloxacin (CIPRO) IVPB 400 mg  400 mg Intravenous Q12H Kathie Rhodes, MD   400 mg at 11/14/16 0844    REVIEW OF SYSTEMS:   Constitutional: Denies fevers, chills or abnormal night sweats Breast:  Denies any palpable lumps or discharge All other systems were reviewed with the patient and are negative.  PHYSICAL EXAMINATION: ECOG PERFORMANCE STATUS: 1 - Symptomatic but completely ambulatory  Vitals:   03/12/22 1239  BP: 136/82  Pulse: 72  Resp: 18  Temp: 97.7 F (36.5 C)  SpO2: 96%   Filed Weights   03/12/22 1239  Weight: 128 lb 14.4 oz (58.5 kg)    GENERAL:alert, no distress and comfortable BREAST: No palpable nodules in breast. No palpable axillary or supraclavicular lymphadenopathy (exam performed in the presence of a chaperone)   LABORATORY DATA:  I have reviewed the data as listed Lab Results  Component Value Date   WBC 4.9 03/12/2022   HGB 12.8 03/12/2022   HCT 38.6 03/12/2022   MCV 88.1 03/12/2022   PLT 154 03/12/2022   Lab Results  Component Value Date   NA 142 03/12/2022   K 3.8 03/12/2022   CL 106 03/12/2022   CO2 30 03/12/2022    RADIOGRAPHIC STUDIES: I have personally reviewed the radiological reports and agreed with the findings in the report.  ASSESSMENT AND PLAN:  Malignant neoplasm of upper-outer quadrant of left breast in female, estrogen receptor positive  ([place]) 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; lymph node biopsy: Positive, breast biopsy: Grade 2 IDC ER 100%, PR 0%, HER2 negative, Ki-67 10%  Pathology and radiology counseling: Discussed with the patient, the details of pathology including the type of breast cancer,the clinical staging, the significance of ER, PR and HER-2/neu receptors and the implications for treatment. After reviewing the pathology in detail, we proceeded to discuss the different treatment options between surgery, radiation, chemotherapy, antiestrogen therapies.  Treatment plan: 1.  Neoadjuvant chemotherapy with dose dense Adriamycin and Cytoxan x4 followed by Taxol weekly x12 2. breast conserving surgery with targeted node dissection 3.  Adjuvant radiation 4.  Follow-up antiestrogen therapy with abemaciclib  Chemotherapy Counseling: I discussed the risks and benefits of chemotherapy including the risks of nausea/  vomiting, risk of infection from low WBC count, fatigue due to chemo or anemia, bruising or bleeding due to low platelets, mouth sores, loss/ change in taste and decreased appetite. Liver and kidney function will be monitored through out chemotherapy as abnormalities in liver and kidney function may be a side effect of treatment. Cardiac dysfunction due to Adriamycin were discussed in detail.  Neuropathy risk from Taxol was also discussed.  Risk of permanent bone marrow dysfunction and leukemia due to chemo were also discussed.  Plan: Port placement, echo, chemo class Nausea study and blood draw studies Return to clinic to start chemotherapy in 2 weeks    All questions were answered. The patient knows to call the clinic with any problems, questions or concerns.    Harriette Ohara, MD 03/12/22

## 2022-03-12 NOTE — Research (Signed)
[id number] - TREATMENT OF REFRACTORY NAUSEA  Patient Veronica Robles was identified by Dr Lindi Adie as a potential candidate for the above listed study. This Clinical Research Nurse met with [name], [id number], on 03/12/22 in a manner and location that ensures patient privacy to discuss participation in the above listed research study. Patient is Unaccompanied. A copy of the informed consent document and separate HIPAA Authorization was provided to the patient. Patient reads, speaks, and understands [place].    Patient was provided with the business card of this Nurse and encouraged to contact the research team with any questions.  Approximately 10 minutes were spent with the patient reviewing the informed consent documents. Patient was provided the option of taking informed consent documents home to review and was encouraged to review at their convenience with their support network, including other care providers. Patient took the consent documents home to review.  Research will follow-up with patient on Monday, 8/21, to answer questions and further discuss participation.  Vickii Penna, RN, BSN, CPN Clinical Research Nurse I [phone]  03/12/2022 3:57 PM

## 2022-03-12 NOTE — Progress Notes (Signed)
[facility] Psychosocial Distress Screening Spiritual Care  Met with Veronica Robles in [facility] to introduce [facility] team/resources, reviewing distress screen per protocol.  The patient scored a 7 on the Psychosocial Distress Thermometer which indicates severe distress. Also assessed for distress and other psychosocial needs.    03/12/2022  ONCBCN DISTRESS SCREENING   Screening Type Initial Screening   Distress experienced in past week (1-10) 7 !   Emotional problem type Nervousness/Anxiety   Referral to support programs Yes     Shadowed by Veronica Robles/Counseling Intern, visited with Veronica Robles in [facility] to provide emotional support and introduce [facility] support programming.  Veronica Robles shared that she has lost three family members in the past six months, including her close sister Veronica Robles, who died of lung cancer. Being here at [facility] stirs up grief from these losses, both because Veronica Robles was treated here and because Veronica Robles misses the support that her loved ones would have provided to her through her diagnosis and treatment.  Provided empathic listening, emotional support, normalization of feelings, and encouragement to explore support programming as energy allows.  Follow up needed: Yes.  Veronica Robles welcomes a follow-up call from [facility]les/Counseling Intern to schedule an initial appointment.   [place], [place], [facility] Pager [phone] Voicemail [phone]

## 2022-03-12 NOTE — Assessment & Plan Note (Signed)
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; lymph node biopsy: Positive, breast biopsy: Grade 2 IDC ER 100%, PR 0%, HER2 negative, Ki-67 10%  Pathology and radiology counseling: Discussed with the patient, the details of pathology including the type of breast cancer,the clinical staging, the significance of ER, PR and HER-2/neu receptors and the implications for treatment. After reviewing the pathology in detail, we proceeded to discuss the different treatment options between surgery, radiation, chemotherapy, antiestrogen therapies.  Treatment plan: 1.  Neoadjuvant chemotherapy with dose dense Adriamycin and Cytoxan x4 followed by Taxol weekly x12 2. breast conserving surgery with targeted node dissection 3.  Adjuvant radiation 4.  Follow-up antiestrogen therapy with abemaciclib  Chemotherapy Counseling: I discussed the risks and benefits of chemotherapy including the risks of nausea/ vomiting, risk of infection from low WBC count, fatigue due to chemo or anemia, bruising or bleeding due to low platelets, mouth sores, loss/ change in taste and decreased appetite. Liver and kidney function will be monitored through out chemotherapy as abnormalities in liver and kidney function may be a side effect of treatment. Cardiac dysfunction due to Adriamycin were discussed in detail.  Neuropathy risk from Taxol was also discussed.  Risk of permanent bone marrow dysfunction and leukemia due to chemo were also discussed.  Plan: Port placement, echo, chemo class Nausea study and blood draw studies Return to clinic to start chemotherapy in 2 weeks

## 2022-03-12 NOTE — Therapy (Signed)
OUTPATIENT PHYSICAL THERAPY BREAST CANCER BASELINE EVALUATION   Patient Name: Veronica Robles MRN: [id number] DOB:[birth year], 66 y.o., female Today's Date: 03/12/2022   PT End of Session - 03/12/22 1440     Visit Number 1    Number of Visits 2    Date for PT Re-Evaluation 09/12/22    PT Start Time 4765    PT Stop Time 1346   Also saw pt from 1413-1427 for a total of 26 min   PT Time Calculation (min) 12 min    Activity Tolerance Patient tolerated treatment well    Behavior During Therapy [facility] for tasks assessed/performed             Past Medical History:  Diagnosis Date   Breast cancer ([place])    HIV infection ([place])    Hypertension    Past Surgical History:  Procedure Laterality Date   ABDOMINAL HYSTERECTOMY     APPENDECTOMY     BREAST BIOPSY Right 2015   CYSTOSCOPY/RETROGRADE/URETEROSCOPY Right 11/14/2016   Procedure: CYSTOSCOPY/RETROGRADE/URETEROSCOPY/ STONE EXTRACTION/HOLMIUM LASER/STENT PLACEMENT;  Surgeon: Kathie Rhodes, MD;  Location: [facility];  Service: Urology;  Laterality: Right;   Patient Active Problem List   Diagnosis Date Noted   Malignant neoplasm of upper-outer quadrant of left breast in female, estrogen receptor positive ([place]) 03/07/2022   Dental caries 12/01/2021   Other viral warts 12/01/2021   Healthcare maintenance 11/09/2018   VISUAL IMPAIRMENT 10/01/2007   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 [name]  REFERRING DIAG: Left breast cancer  THERAPY DIAG:  Malignant neoplasm of upper-outer quadrant of left breast in female, estrogen receptor positive ([place])  Abnormal posture  Rationale for Evaluation and Treatment Rehabilitation  ONSET DATE: 03/03/2022  SUBJECTIVE                                                                                                                                                                                            SUBJECTIVE STATEMENT: Patient reports she is here today to be seen by her medical team for her newly diagnosed left breast cancer.   PERTINENT HISTORY:  Patient was diagnosed on 03/03/2022 with left grade 2 invasive ductal carcinoma breast cancer. It measures 9 mm and is located in the upper outer quadrant. It is ER positive, PR negative, and HER2 negative with a Ki67 of 10%. She has a positive axillary node. She also has HIV disease which she has not shared with  her family.  PATIENT GOALS   reduce lymphedema risk and learn post op HEP.   PAIN:  Are you having pain? No   PRECAUTIONS: Active CA; HIV positive  HAND DOMINANCE: right  WEIGHT BEARING RESTRICTIONS No  FALLS:  Has patient fallen in last 6 months? No  LIVING ENVIRONMENT: Patient lives with: her brother Lives in: House/apartment Has following equipment at home: None  OCCUPATION: retired  LEISURE: She walks 1-2x/week with her grandchildren  PRIOR LEVEL OF FUNCTION: Independent   OBJECTIVE  COGNITION:  Overall cognitive status: Within functional limits for tasks assessed    POSTURE:  Forward head and rounded shoulders posture  UPPER EXTREMITY AROM/PROM:  A/PROM RIGHT   eval   Shoulder extension 50  Shoulder flexion 136  Shoulder abduction 149  Shoulder internal rotation 64  Shoulder external rotation 73    (Blank rows = not tested)  A/PROM LEFT   eval  Shoulder extension 54  Shoulder flexion 137  Shoulder abduction 151  Shoulder internal rotation 59  Shoulder external rotation 83    (Blank rows = not tested)   CERVICAL AROM: All within normal limits  UPPER EXTREMITY STRENGTH: WNL   LYMPHEDEMA ASSESSMENTS:   LANDMARK RIGHT   eval  10 cm proximal to olecranon process 25.7  Olecranon process 22.1  10 cm proximal to ulnar styloid process 19.6  Just proximal to ulnar  styloid process 14.7  Across hand at thumb web space 17.6  At base of 2nd digit 6.2  (Blank rows = not tested)  LANDMARK LEFT   eval  10 cm proximal to olecranon process 25.2  Olecranon process 21.5  10 cm proximal to ulnar styloid process 18.8  Just proximal to ulnar styloid process 14.7  Across hand at thumb web space 17.2  At base of 2nd digit 6  (Blank rows = not tested)   L-DEX LYMPHEDEMA SCREENING:  The patient was assessed using the L-Dex machine today to produce a lymphedema index baseline score. The patient will be reassessed on a regular basis (typically every 3 months) to obtain new L-Dex scores. If the score is > 6.5 points away from his/her baseline score indicating onset of subclinical lymphedema, it will be recommended to wear a compression garment for 4 weeks, 12 hours per day and then be reassessed. If the score continues to be > 6.5 points from baseline at reassessment, we will initiate lymphedema treatment. Assessing in this manner has a 95% rate of preventing clinically significant lymphedema.   L-DEX FLOWSHEETS - 03/12/22 1400       L-DEX LYMPHEDEMA SCREENING   Measurement Type Unilateral    L-DEX MEASUREMENT EXTREMITY Upper Extremity    POSITION  Standing    DOMINANT SIDE Right    At Risk Side Left    BASELINE SCORE (UNILATERAL) 0.2              QUICK DASH SURVEY:   Katina Dung - 03/12/22 0001     Open a tight or new jar No difficulty    Do heavy household chores (wash walls, wash floors) No difficulty    Carry a shopping bag or briefcase No difficulty    Wash your back No difficulty    Use a knife to cut food No difficulty    Recreational activities in which you take some force or impact through your arm, shoulder, or hand (golf, hammering, tennis) No difficulty    During the past week, to what extent has your arm, shoulder or hand  problem interfered with your normal social activities with family, friends, neighbors, or groups? Not at all     During the past week, to what extent has your arm, shoulder or hand problem limited your work or other regular daily activities Not at all    Arm, shoulder, or hand pain. None    Tingling (pins and needles) in your arm, shoulder, or hand None    Difficulty Sleeping No difficulty    DASH Score 0 %              PATIENT EDUCATION:  Education details: Lymphedema risk reduction and post op shoulder/posture HEP Person educated: Patient Education method: Explanation, Demonstration, Handout Education comprehension: Patient verbalized understanding and returned demonstration   HOME EXERCISE PROGRAM: Patient was instructed today in a home exercise program today for post op shoulder range of motion. These included active assist shoulder flexion in sitting, scapular retraction, wall walking with shoulder abduction, and hands behind head external rotation.  She was encouraged to do these twice a day, holding 3 seconds and repeating 5 times when permitted by her physician.   ASSESSMENT:  CLINICAL IMPRESSION: Patient was diagnosed on 03/03/2022 with left grade 2 invasive ductal carcinoma breast cancer. It measures 9 mm and is located in the upper outer quadrant. It is ER positive, PR negative, and HER2 negative with a Ki67 of 10%. She has a positive axillary node. She also has HIV disease which she has not shared with her family.Her multidisciplinary medical team met prior to her assessments to determine a recommended treatment plan. She is planning to have neoadjuvant chemotherapy followed by a left lumpectomy and targeted node dissection, radiation, and anti-estrogen therapy. She will benefit from a post op PT reassessment to determine needs and from L-Dex screens every 3 months for 2 years to detect subclinical lymphedema.  Pt will benefit from skilled therapeutic intervention to improve on the following deficits: Decreased knowledge of precautions, impaired UE functional use, pain, decreased ROM,  postural dysfunction.   PT treatment/interventions: ADL/self-care home management, pt/family education, therapeutic exercise  REHAB POTENTIAL: Excellent  CLINICAL DECISION MAKING: Stable/uncomplicated  EVALUATION COMPLEXITY: Low   GOALS: Goals reviewed with patient? YES  LONG TERM GOALS: (STG=LTG)    Name Target Date Goal status  1 Pt will be able to verbalize understanding of pertinent lymphedema risk reduction practices relevant to her dx specifically related to skin care.  Baseline:  No knowledge 03/12/2022 Achieved at eval  2 Pt will be able to return demo and/or verbalize understanding of the post op HEP related to regaining shoulder ROM. Baseline:  No knowledge 03/12/2022 Achieved at eval  3 Pt will be able to verbalize understanding of the importance of attending the post op After Breast CA Class for further lymphedema risk reduction education and therapeutic exercise.  Baseline:  No knowledge 03/12/2022 Achieved at eval  4 Pt will demo she has regained full shoulder ROM and function post operatively compared to baselines.  Baseline: See objective measurements taken today. 09/12/2022      PLAN: PT FREQUENCY/DURATION: EVAL and 1 follow up appointment.   PLAN FOR NEXT SESSION: will reassess 3-4 weeks post op to determine needs.   Patient will follow up at outpatient cancer rehab 3-4 weeks following surgery.  If the patient requires physical therapy at that time, a specific plan will be dictated and sent to the referring physician for approval. The patient was educated today on appropriate basic range of motion exercises to begin post operatively and the  importance of attending the After Breast Cancer class following surgery.  Patient was educated today on lymphedema risk reduction practices as it pertains to recommendations that will benefit the patient immediately following surgery.  She verbalized good understanding.    Physical Therapy Information for After Breast Cancer  Surgery/Treatment:  Lymphedema is a swelling condition that you may be at risk for in your arm if you have lymph nodes removed from the armpit area.  After a sentinel node biopsy, the risk is approximately 5-9% and is higher after an axillary node dissection.  There is treatment available for this condition and it is not life-threatening.  Contact your physician or physical therapist with concerns. You may begin the 4 shoulder/posture exercises (see additional sheet) when permitted by your physician (typically a week after surgery).  If you have drains, you may need to wait until those are removed before beginning range of motion exercises.  A general recommendation is to not lift your arms above shoulder height until drains are removed.  These exercises should be done to your tolerance and gently.  This is not a "no pain/no gain" type of recovery so listen to your body and stretch into the range of motion that you can tolerate, stopping if you have pain.  If you are having immediate reconstruction, ask your plastic surgeon about doing exercises as he or she may want you to wait. We encourage you to attend the free one time ABC (After Breast Cancer) class offered by [facility].  You will learn information related to lymphedema risk, prevention and treatment and additional exercises to regain mobility following surgery.  You can call [phone] for more information.  This is offered the 1st and 3rd Monday of each month.  You only attend the class one time. While undergoing any medical procedure or treatment, try to avoid blood pressure being taken or needle sticks from occurring on the arm on the side of cancer.   This recommendation begins after surgery and continues for the rest of your life.  This may help reduce your risk of getting lymphedema (swelling in your arm). An excellent resource for those seeking information on lymphedema is the National Lymphedema Network's web site. It  can be accessed at Emily.org If you notice swelling in your hand, arm or breast at any time following surgery (even if it is many years from now), please contact your doctor or physical therapist to discuss this.  Lymphedema can be treated at any time but it is easier for you if it is treated early on.  If you feel like your shoulder motion is not returning to normal in a reasonable amount of time, please contact your surgeon or physical therapist.  [facility] [phone]. [address], [place] [address]  ABC CLASS After Breast Cancer Class  After Breast Cancer Class is a specially designed exercise class to assist you in a safe recover after having breast cancer surgery.  In this class you will learn how to get back to full function whether your drains were just removed or if you had surgery a month ago.  This one-time class is held the 1st and 3rd Monday of every month from 11:00 a.m. until 12:00 noon virtually.  This class is FREE and space is limited. For more information or to register for the next available class, call [phone].  Class Goals  Understand specific stretches to improve the flexibility of you chest and  shoulder. Learn ways to safely strengthen your upper body and improve your posture. Understand the warning signs of infection and why you may be at risk for an arm infection. Learn about Lymphedema and prevention.  ** You do not attend this class until after surgery.  Drains must be removed to participate  Patient was instructed today in a home exercise program today for post op shoulder range of motion. These included active assist shoulder flexion in sitting, scapular retraction, wall walking with shoulder abduction, and hands behind head external rotation.  She was encouraged to do these twice a day, holding 3 seconds and repeating 5 times when permitted by her physician.   Annia Friendly, [place] 03/12/22  2:55 PM

## 2022-03-12 NOTE — Addendum Note (Signed)
Addended by: Nicholas Lose on: 03/12/2022 03:09 PM   Modules accepted: Level of Service

## 2022-03-12 NOTE — Progress Notes (Signed)
START ON PATHWAY REGIMEN - Breast     Cycles 1 through 4: A cycle is every 14 days:     Doxorubicin      Cyclophosphamide      Pegfilgrastim-[id number]    Cycles 5 through 16: A cycle is every 7 days:     Paclitaxel   **Always confirm dose/schedule in your pharmacy ordering system**  Patient Characteristics: Preoperative or Nonsurgical Candidate (Clinical Staging), Neoadjuvant Therapy followed by Surgery, Invasive Disease, Chemotherapy, HER2 Negative/Unknown/Equivocal, ER Positive Therapeutic Status: Preoperative or Nonsurgical Candidate (Clinical Staging) AJCC M Category: cM0 AJCC Grade: G2 Breast Surgical Plan: Neoadjuvant Therapy followed by Surgery ER Status: Positive (+) AJCC 8 Stage Grouping: IIA HER2 Status: Negative (-) AJCC T Category: cT1b AJCC N Category: cN1 PR Status: Negative (-) Intent of Therapy: Curative Intent, Discussed with Patient

## 2022-03-12 NOTE — Research (Signed)
[employer] 2021-05 - Specimen Collection Study to Evaluate Biomarkers in Subjects with Cancer   Patient Veronica Robles was identified by Dr Lindi Adie as a potential candidate for the above listed study. This Clinical Research Nurse met with Veronica Robles, [id number], on 03/12/22 in a manner and location that ensures patient privacy to discuss participation in the above listed research study. Patient is Unaccompanied. A copy of the informed consent document with embedded HIPAA language was provided to the patient. Patient reads, speaks, and understands [place].    Patient was provided with the business card of this Nurse and encouraged to contact the research team with any questions.  Approximately 10 minutes were spent with the patient reviewing the informed consent documents. Patient was provided the option of taking informed consent documents home to review and was encouraged to review at their convenience with their support network, including other care providers. Patient took the consent documents home to review.  Research will follow-up with patient on Monday, 8/21, to answer questions and further discuss participation.  Vickii Penna, RN, BSN, CPN Clinical Research Nurse I [phone]  03/12/2022 3:56 PM

## 2022-03-13 ENCOUNTER — Other Ambulatory Visit: Payer: Self-pay

## 2022-03-14 ENCOUNTER — Telehealth: Payer: Self-pay | Admitting: Genetic Counselor

## 2022-03-14 NOTE — Telephone Encounter (Signed)
Patient LVM requesting to cancel genetic testing.  Send message to [facility] requesting to cancel testing.  Returned patient call and LVM letting her know request to cancel testing was received.  She knows to reach out to [place] in genetics if she interested in testing in the future.

## 2022-03-17 ENCOUNTER — Telehealth: Payer: Self-pay

## 2022-03-17 ENCOUNTER — Telehealth: Payer: Self-pay | Admitting: Hematology and Oncology

## 2022-03-17 NOTE — Telephone Encounter (Signed)
[employer] 2021-05 - Specimen Collection Study to Evaluate Biomarkers in Subjects with Cancer   [id number] - TREATMENT OF REFRACTORY NAUSEA  Called patient to follow-up on interest in above-listed studies. No answer; left voicemail with callback information.   Vickii Penna, RN, BSN, CPN Clinical Research Nurse I [phone]  03/17/2022 1:51 PM

## 2022-03-17 NOTE — Telephone Encounter (Signed)
Scheduled per 8/17 in basket, message has been left

## 2022-03-18 ENCOUNTER — Other Ambulatory Visit: Payer: Medicare HMO

## 2022-03-18 ENCOUNTER — Encounter (HOSPITAL_COMMUNITY): Payer: Self-pay

## 2022-03-18 ENCOUNTER — Ambulatory Visit (HOSPITAL_COMMUNITY)
Admission: RE | Admit: 2022-03-18 | Discharge: 2022-03-18 | Disposition: A | Discharge status: Home / Self Care | Payer: Medicare HMO | Source: Ambulatory Visit | Attending: Hematology and Oncology | Admitting: Hematology and Oncology

## 2022-03-18 DIAGNOSIS — C50412 Malignant neoplasm of upper-outer quadrant of left female breast: Secondary | ICD-10-CM | POA: Diagnosis present

## 2022-03-18 DIAGNOSIS — Z170 Estrogen receptor positive status [ER+]: Secondary | ICD-10-CM | POA: Diagnosis present

## 2022-03-18 MED ORDER — SODIUM CHLORIDE (PF) 0.9 % IJ SOLN
INTRAMUSCULAR | Status: AC
  Filled 2022-03-18: qty 50

## 2022-03-18 MED ORDER — IOHEXOL 300 MG/ML  SOLN
100.0000 mL | Freq: Once | INTRAMUSCULAR | Status: AC | PRN
  Administered 2022-03-18: 100 mL via INTRAVENOUS

## 2022-03-19 ENCOUNTER — Encounter (HOSPITAL_BASED_OUTPATIENT_CLINIC_OR_DEPARTMENT_OTHER): Payer: Self-pay | Admitting: Surgery

## 2022-03-19 ENCOUNTER — Inpatient Hospital Stay: Payer: Medicare HMO

## 2022-03-19 ENCOUNTER — Other Ambulatory Visit: Payer: Self-pay

## 2022-03-19 ENCOUNTER — Ambulatory Visit (HOSPITAL_COMMUNITY)
Admission: RE | Admit: 2022-03-19 | Discharge: 2022-03-19 | Disposition: A | Discharge status: Home / Self Care | Payer: Medicare HMO | Source: Ambulatory Visit | Attending: Hematology and Oncology | Admitting: Hematology and Oncology

## 2022-03-19 DIAGNOSIS — I10 Essential (primary) hypertension: Secondary | ICD-10-CM | POA: Insufficient documentation

## 2022-03-19 DIAGNOSIS — Z170 Estrogen receptor positive status [ER+]: Secondary | ICD-10-CM | POA: Diagnosis not present

## 2022-03-19 DIAGNOSIS — Z1501 Genetic susceptibility to malignant neoplasm of breast: Secondary | ICD-10-CM | POA: Insufficient documentation

## 2022-03-19 DIAGNOSIS — Z0189 Encounter for other specified special examinations: Secondary | ICD-10-CM | POA: Diagnosis not present

## 2022-03-19 DIAGNOSIS — I351 Nonrheumatic aortic (valve) insufficiency: Secondary | ICD-10-CM | POA: Diagnosis not present

## 2022-03-19 DIAGNOSIS — C50412 Malignant neoplasm of upper-outer quadrant of left female breast: Secondary | ICD-10-CM

## 2022-03-19 LAB — ECHOCARDIOGRAM COMPLETE
Area-P 1/2: 3.34 cm2
S' Lateral: 2.1 cm

## 2022-03-19 NOTE — Research (Signed)
[employer] 2021-05 - Specimen Collection Study to Evaluate Biomarkers in Subjects with Cancer   [id number] - TREATMENT OF REFRACTORY NAUSEA  Spoke with patient to follow-up on above-listed studies when she presented to clinic for chemo education. She stated that she was not interested at this time. Informed her that I would meet with her during her first chemo infusion on 8/31 to discuss a possible demographics study (DCP). Patient thanked for her time and consideration and encouraged to contact research with any further questions or concerns.  Vickii Penna, RN, BSN, CPN Clinical Research Nurse I [phone]  03/19/2022 11:03 AM

## 2022-03-20 ENCOUNTER — Telehealth: Payer: Self-pay | Admitting: *Deleted

## 2022-03-20 ENCOUNTER — Encounter: Payer: Self-pay | Admitting: Genetic Counselor

## 2022-03-20 ENCOUNTER — Encounter (HOSPITAL_COMMUNITY)
Admission: RE | Admit: 2022-03-20 | Discharge: 2022-03-20 | Disposition: A | Discharge status: Home / Self Care | Payer: Medicare HMO | Source: Ambulatory Visit | Attending: Hematology and Oncology | Admitting: Hematology and Oncology

## 2022-03-20 ENCOUNTER — Encounter (HOSPITAL_BASED_OUTPATIENT_CLINIC_OR_DEPARTMENT_OTHER)
Admission: RE | Admit: 2022-03-20 | Discharge: 2022-03-20 | Disposition: A | Discharge status: Home / Self Care | Payer: Medicare HMO | Source: Ambulatory Visit | Attending: Surgery | Admitting: Surgery

## 2022-03-20 ENCOUNTER — Encounter: Payer: Self-pay | Admitting: *Deleted

## 2022-03-20 ENCOUNTER — Encounter: Payer: Self-pay | Admitting: Hematology and Oncology

## 2022-03-20 DIAGNOSIS — Z803 Family history of malignant neoplasm of breast: Secondary | ICD-10-CM

## 2022-03-20 DIAGNOSIS — Z800 Family history of malignant neoplasm of digestive organs: Secondary | ICD-10-CM

## 2022-03-20 DIAGNOSIS — Z170 Estrogen receptor positive status [ER+]: Secondary | ICD-10-CM | POA: Insufficient documentation

## 2022-03-20 DIAGNOSIS — C50412 Malignant neoplasm of upper-outer quadrant of left female breast: Secondary | ICD-10-CM | POA: Insufficient documentation

## 2022-03-20 DIAGNOSIS — Z01818 Encounter for other preprocedural examination: Secondary | ICD-10-CM | POA: Diagnosis not present

## 2022-03-20 HISTORY — DX: Family history of malignant neoplasm of digestive organs: Z80.0

## 2022-03-20 HISTORY — DX: Family history of malignant neoplasm of breast: Z80.3

## 2022-03-20 MED ORDER — TECHNETIUM TC 99M MEDRONATE IV KIT
20.0000 | PACK | Freq: Once | INTRAVENOUS | Status: AC | PRN
  Administered 2022-03-20: 20.7 via INTRAVENOUS

## 2022-03-20 NOTE — Telephone Encounter (Signed)
Left vm regarding [facility] from 03/12/22. Contact information provided for questions or needs.

## 2022-03-20 NOTE — Progress Notes (Signed)
Patient was provided with CHG cleanser to use at home before the procedure. Patient verbalized understanding of instructions.       Patient Instructions  The night before surgery:  No food after midnight. ONLY clear liquids after midnight  The day of surgery (if you do NOT have diabetes):  Drink ONE (1) Pre-Surgery Clear Ensure as directed.   This drink was given to you during your hospital  pre-op appointment visit. The pre-op nurse will instruct you on the time to drink the  Pre-Surgery Ensure depending on your surgery time. Finish the drink at the designated time by the pre-op nurse.  Nothing else to drink after completing the  Pre-Surgery Clear Ensure.  The day of surgery (if you have diabetes): Drink ONE (1) Gatorade 2 (G2) as directed. This drink was given to you during your hospital  pre-op appointment visit.  The pre-op nurse will instruct you on the time to drink the   Gatorade 2 (G2) depending on your surgery time. Color of the Gatorade may vary. Red is not allowed. Nothing else to drink after completing the  Gatorade 2 (G2).         If you have questions, please contact your surgeon's office.

## 2022-03-20 NOTE — Progress Notes (Signed)
Pharmacist Chemotherapy Monitoring - Initial Assessment    Anticipated start date: 03/27/22   The following has been reviewed per standard work regarding the patient's treatment regimen: The patient's diagnosis, treatment plan and drug doses, and organ/hematologic function Lab orders and baseline tests specific to treatment regimen  The treatment plan start date, drug sequencing, and pre-medications Prior authorization status  Patient's documented medication list, including drug-drug interaction screen and prescriptions for anti-emetics and supportive care specific to the treatment regimen The drug concentrations, fluid compatibility, administration routes, and timing of the medications to be used The patient's access for treatment and lifetime cumulative dose history, if applicable  The patient's medication allergies and previous infusion related reactions, if applicable   Changes made to treatment plan:  N/A  Follow up needed:  Pending authorization for treatment    Veronica Robles, [place], 03/20/2022  2:07 PM

## 2022-03-20 NOTE — Progress Notes (Addendum)
REFERRING PROVIDER: Nicholas Lose, MD [place],   [id number]  PRIMARY PROVIDER:  [name], MD  PRIMARY REASON FOR VISIT:  1. Malignant neoplasm of upper-outer quadrant of left breast in female, estrogen receptor positive ([place])   2. Family history of breast cancer   3. Family history of pancreatic cancer      HISTORY OF PRESENT ILLNESS:   Veronica Robles, a 66 y.o. female, was seen for a [place] cancer genetics consultation at the request of Dr. Lindi Adie due to a personal and family history of cancer.  Veronica Robles presents to clinic today to discuss the possibility of a hereditary predisposition to cancer, to discuss genetic testing, and to further clarify her future cancer risks, as well as potential cancer risks for family members.   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   CANCER HISTORY:  Oncology History  Malignant neoplasm of upper-outer quadrant of left breast in female, estrogen receptor positive ([place])  02/28/2022 Initial Diagnosis   Screening detected left breast mass at 3 o'clock position measuring 0.9 cm, 4 enlarged left axillary lymph nodes along with asymmetric ducts between mass and the nipple possibly DCIS; lymph node biopsy: Positive, breast biopsy: Grade 2 IDC ER 100%, PR 0%, HER2 negative, Ki-67 10%   03/10/2022 Cancer Staging   Staging form: Breast, AJCC 8th Edition - Clinical stage from 03/10/2022: Stage IIA (cT1b, cN1(f), cM0, G2, ER+, PR-, HER2-) - Signed by Hayden Pedro, PA-C on 03/10/2022 Stage prefix: Initial diagnosis Method of lymph node assessment: Core biopsy Histologic grading system: 3 grade system   03/27/2022 -  Chemotherapy   Patient is on Treatment Plan : BREAST ADJUVANT DOSE DENSE AC q14d / PACLitaxel q7d        Past Medical History:  Diagnosis Date   Breast cancer ([place]) 02/2022   left breast IDC with DCIS   Family  history of breast cancer 03/20/2022   Family history of pancreatic cancer 03/20/2022   HIV infection ([place]) 1993   Hypertension     Past Surgical History:  Procedure Laterality Date   ABDOMINAL HYSTERECTOMY     APPENDECTOMY     BREAST BIOPSY Right 2015   CYSTOSCOPY/RETROGRADE/URETEROSCOPY Right 11/14/2016   Procedure: CYSTOSCOPY/RETROGRADE/URETEROSCOPY/ STONE EXTRACTION/HOLMIUM LASER/STENT PLACEMENT;  Surgeon: Kathie Rhodes, MD;  Location: [facility];  Service: Urology;  Laterality: Right;    FAMILY HISTORY:  We obtained a detailed, 4-generation family history.  Significant diagnoses are listed below: Family History  Problem Relation Age of Onset   Pancreatic cancer Father 67   Breast cancer Sister        dx 39s   Lung cancer Sister        d. 2    Veronica Robles is unaware of previous family history of genetic testing for hereditary cancer risks. There is no reported Ashkenazi Jewish ancestry. There is no known consanguinity.  GENETIC COUNSELING ASSESSMENT: Veronica Robles is a 66 y.o. female with a personal and family history somewhat suggestive of a hereditary cancer syndrome and predisposition to cancer given the presence of related cancers in the family (breast and pancreatic). We, therefore, discussed and recommended the following at today's visit.   DISCUSSION: We discussed that 5 - 10% of cancer is hereditary.  Most cases of hereditary breast and pancreatic cancer are associated with mutations in BRCA1/2.  There are other genes that can be associated with hereditary breast or pancreatic  cancer syndromes.  We discussed that testing is beneficial for several reasons including knowing how to follow individuals for their cancer risks and understanding if other family members could be at risk for cancer and allowing them to undergo genetic testing.   We reviewed the characteristics, features and inheritance patterns of hereditary cancer syndromes. We also discussed genetic testing, including the  appropriate family members to test, the process of testing, insurance coverage and turn-around-time for results. We discussed the implications of a negative, positive, carrier and/or variant of uncertain significant result. We recommended Veronica Robles pursue genetic testing for a panel that includes genes associated with breast and pancreatic cancer.   Based on Veronica Robles's personal history of breast cancer and family history of pancreatic and breast cancers, she meets medical criteria for genetic testing. Despite that she meets criteria, she may still have an out of pocket cost. We discussed that if her out of pocket cost for testing is over $100, the laboratory should contact her and discuss the self-pay prices and/or patient pay assistance programs.    PLAN:  Veronica Robles initially consented to genetic testing but called after her appointment to cancel the genetic testing. We remain available to coordinate genetic testing at any time in the future. We, therefore, recommend Veronica Robles continue to follow the cancer screening guidelines given by her primary healthcare provider.  Veronica Robles questions were answered to her satisfaction today. Our contact information was provided should additional questions or concerns arise. Thank you for the referral and allowing [place] to share in the care of your patient.   Veronica Robles M. Joette Catching, [place], [facility] Genetic Counselor [email or web address] (P) [phone]  The patient was seen for a total of 12 minutes in face-to-face genetic counseling.  The was accompanied by her son, Veronica Robles.  Drs. Lindi Adie and/or Burr Medico were available to discuss this case as needed.    _______________________________________________________________________ For Office Staff:  Number of people involved in session: 2 Was an Intern/ student involved with case: no

## 2022-03-21 ENCOUNTER — Telehealth: Payer: Self-pay | Admitting: Pharmacist

## 2022-03-21 ENCOUNTER — Other Ambulatory Visit: Payer: Medicare HMO

## 2022-03-21 ENCOUNTER — Telehealth: Payer: Self-pay | Admitting: Internal Medicine

## 2022-03-21 ENCOUNTER — Other Ambulatory Visit: Payer: Self-pay | Admitting: Pharmacist

## 2022-03-21 ENCOUNTER — Other Ambulatory Visit: Payer: Self-pay

## 2022-03-21 DIAGNOSIS — B20 Human immunodeficiency virus [HIV] disease: Secondary | ICD-10-CM

## 2022-03-21 NOTE — Telephone Encounter (Signed)
Called patient to see how she is doing with new diagnosis of breast ca.  Her current hiv regimen will need to be changed to minimize drug interaction with her upcoming chemo regimen We will have her come to clinic on 8/25 for HIV labs to see if she is undetectable Plan to change her regimen early next week. She is on a salvage regimen includes protease inhibitor, plus poor adherence. Will be challenging.  -------------hiv geno history. Nucleoside Reverse Transcriptase Inhibitors abacavir (ABC) High-Level Resistance zidovudine (AZT) Susceptible emtricitabine (FTC) High-Level Resistance lamivudine (3TC) High-Level Resistance tenofovir (TDF) Intermediate Resistance Non-nucleoside Reverse Transcriptase Inhibitors doravirine (DOR) Intermediate Resistance efavirenz (EFV) High-Level Resistance etravirine (ETR) High-Level Resistance nevirapine (NVP) High-Level Resistance rilpivirine (RPV) High-Level Resistance  Integrase Strand Transfer Inhibitors  bictegravir (BIC) Potential Low-Level Resistance  cabotegravir (CAB) Low-Level Resistance  dolutegravir (DTG) Potential Low-Level Resistance  elvitegravir (EVG) High-Level Resistance  raltegravir (RAL) High-Level Resistance  Veronica Robles B. [place] [facility] [phone]

## 2022-03-21 NOTE — Telephone Encounter (Signed)
Cumulative HIV Genotype Data  RT Mutations  K65R, S68SG, M184IV, K101E, E138K, Y181C, G190A  PI Mutations  none  Integrase Mutations  N155H, D232N   Interpretation of Genotype Data per [employer] HIV Drug Resistance Database:  Nucleoside RTIs  Abacavir - high level resistance Zidovudine - susceptible Emtricitabine - high level resistance Lamivudine - high level resistance Tenofovir - intermediate resistance   Non-Nucleoside RTIs  Doravirine - intermediate resistance Efavirenz - high level resistance Etravirine - high level resistance Nevirapine - high level resistance Rilpivirine - high level resistance   Protease Inhibitors  Atazanavir - susceptible Darunavir - susceptible Lopinavir - susceptible   Integrase Inhibitors  Bictegravir - potential low level resistance Cabotegravir - low level resistance Dolutegravir - potential low level resistance Elvitegravir - high level resistance Raltegravir - high level resistance   Chart shows cumulative resistance data for [place]. She is coming by the clinic today to get a genotype and will update chart, if necessary.   She is currently taking Tivicay BID, Evotaz daily, and Zidovudine daily. She is about to start chemotherapy for recently diagnosed left breast cancer. Per Dr. Geralyn Flash note from 03/12/22, she will start on neoadjuvant chemotherapy with Adriamycin, Cytoxan, and Taxol.   - Evotaz is contraindicated with Taxol - potential for increased Taxol concentrations and increased toxicity such as myelosuppression, LFT elevations, constitutional symptoms, and peripheral neuropathy. There have been case reports of severe, potentially life threatening Taxol toxicity if combined.   - Zidovudine is contraindicated with Cytoxan and Taxol - potential for increased risk of myelosuppression.   Minka has limited options because of her HIV resistance. She will be able to continue her Tivicay BID but will need to stop her Evotaz and Zidovudine.  Long acting Sunlenca was a viable option, but it is a moderate CYP3A4 inhibitor and Adriamycin package information states to avoid coadministration with CYP3A4 inhibitors. Potential options that do not interact with her chemo - Rukobia BID and SQ Fuzeon BID.   Will await updated genotype and discuss with Dr. Baxter Flattery.  Kassadie Pancake L. Arohi Salvatierra, PharmD, BCIDP, AAHIVP, CPP Clinical Pharmacist Practitioner Infectious Diseases [place] [facility] 03/21/2022, 3:32 PM

## 2022-03-24 ENCOUNTER — Telehealth: Payer: Self-pay

## 2022-03-24 NOTE — Telephone Encounter (Signed)
Veronica Robles, Counseling Intern, attempted to reach patient via phone. Counselor left a voicemail and will follow up via phone later this week.   Lysle Morales  Counseling Intern

## 2022-03-24 NOTE — Telephone Encounter (Signed)
Patient called stating she is having surgery Wednesday and that Dr. Baxter Flattery was going to change her medication. Sent a message to Dr. Baxter Flattery and she replied (yes.we are going to call her to let her know what we will do.)   Relayed message to patient and she verbalized her understanding and had no further questions.

## 2022-03-25 ENCOUNTER — Other Ambulatory Visit: Payer: Medicare HMO

## 2022-03-25 NOTE — H&P (Signed)
REFERRING PHYSICIAN: Rulon Eisenmenger, MD  PROVIDER: Beverlee Nims, MD  MRN: [id number] DOB: 03-Nov-1955 DATE OF ENCOUNTER: 03/12/2022 Subjective   Chief Complaint: Breast Cancer   History of Present Illness: Ruhani Umland is a 66 y.o. female who is seen today as an office consultation for evaluation of Breast Cancer .   This is a 66 year old female who was found on screening mammography to have a small mass in her left breast and at least 4 enlarged lymph nodes. The mass on ultrasound measured 9 mm. She underwent a biopsy of the mass showing an invasive ductal carcinoma. It was 100% ER positive, PR negative, HER2 negative, and had a Ki-67 of 10%. One of the lymph nodes was biopsied showing invasive breast cancer as well. She has had no previous problems regarding her breast. She denies nipple discharge. She has a family history of her sister having had breast cancer. She is otherwise without complaints. She has no cardiopulmonary issues.  Review of Systems: A complete review of systems was obtained from the patient. I have reviewed this information and discussed as appropriate with the patient. See HPI as well for other ROS.  ROS   Medical History: Past Medical History:  Diagnosis Date  Hypertension   Patient Active Problem List  Diagnosis  Malignant neoplasm of upper-outer quadrant of left breast in female, estrogen receptor positive (CMS-HCC)  Hypertension, benign  HIV -NO current or prior AIDS criteria or opportunistic infection, Asymptomatic (CMS-HCC)  Gastroesophageal reflux disease without esophagitis  Weight loss   Past Surgical History:  Procedure Laterality Date  APPENDECTOMY  Unknown date  BREAST EXCISIONAL BIOPSY Right  Unknown date  CYSTOSCOPY  Unknown date  HYSTERECTOMY VAGINAL  Unknown date    Allergies  Allergen Reactions  Penicillamine Rash  Penicillins Rash  Has patient had a PCN reaction causing immediate rash, facial/tongue/throat  swelling, SOB or lightheadedness with hypotension: No Has patient had a PCN reaction causing severe rash involving mucus membranes or skin necrosis: No Has patient had a PCN reaction that required hospitalization No Has patient had a PCN reaction occurring within the last 10 years: No If all of the above answers are "NO", then may proceed with Cephalosporin use.   Current Outpatient Medications on File Prior to Visit  Medication Sig Dispense Refill  amLODIPine (NORVASC) 5 MG tablet Take 5 mg by mouth once daily  atazanavir-cobicistat (EVOTAZ) 300-150 mg tablet Take 1 tablet by mouth once daily  chlorhexidine (PERIDEX) 0.12 % solution Swish and spit 15 mLs 2 (two) times daily  dolutegravir (TIVICAY) 50 mg tablet Take 50 mg by mouth 2 (two) times daily  hydroCHLOROthiazide (HYDRODIURIL) 25 MG tablet Take 25 mg by mouth once daily  imiquimod (ALDARA) 5 % cream Apply 1 packet topically 2 (two) times daily Apply to wart on finger twice a day until wart is gone +7 days  zidovudine (RETROVIR) 300 mg tablet Take 300 mg by mouth 2 (two) times daily   No current facility-administered medications on file prior to visit.   Family History  Problem Relation Age of Onset  High blood pressure (Hypertension) Mother  Hyperlipidemia (Elevated cholesterol) Mother  Coronary Artery Disease (Blocked arteries around heart) Mother  High blood pressure (Hypertension) Father  Hyperlipidemia (Elevated cholesterol) Father  Coronary Artery Disease (Blocked arteries around heart) Father  Breast cancer Sister  Diabetes Sister    Social History   Tobacco Use  Smoking Status Every Day  Types: Cigarettes  Smokeless Tobacco Never    Social  History   Socioeconomic History  Marital status: Single  Tobacco Use  Smoking status: Every Day  Types: Cigarettes  Smokeless tobacco: Never  Vaping Use  Vaping Use: Unknown  Substance and Sexual Activity  Alcohol use: Never  Drug use: Never  Sexual activity: Defer    Objective:  There were no vitals filed for this visit.  There is no height or weight on file to calculate BMI.  Physical Exam   She appears well on exam  There are no palpable breast masses. There are enlarged, mobile lymph nodes in her left axilla.  There is no other adenopathy  Labs, Imaging and Diagnostic Testing: I reviewed her notes in the electronic medical records. I have so reviewed her pathology results, ultrasound, and mammogram  Assessment and Plan:   Diagnoses and all orders for this visit:  Breast cancer metastasized to axillary lymph node, left (CMS-HCC)    This is a patient with a left breast cancer that is already metastasized to lymph nodes. After a discussion in our multidisciplinary breast cancer conference, neoadjuvant chemotherapy is recommended because of the lymph node status. She will also be getting MRI and genetic testing. I had a discussion with the patient and her son regarding the diagnosis. We discussed breast cancer treatment in detail. From a surgical standpoint I explained Port-A-Cath insertion. I explained the risks which includes but is not limited to bleeding, infection, injury to surrounding structures, pneumothorax, the need for further procedures, etc. Next I had a discussion with them regarding breast cancer surgery with regards to breast conservation versus mastectomy. She is interested in breast conservation. After she has received neoadjuvant therapy the plan will be to proceed with a radioactive seed guided left breast lumpectomy and targeted lymph node dissection. I explained the procedure to them in detail as well as the risks as well. They understand and agree with the plans.  Port-A-Cath insertion will be scheduled as soon as possible

## 2022-03-25 NOTE — Anesthesia Preprocedure Evaluation (Signed)
Anesthesia Evaluation  Patient identified by MRN, date of birth, ID band Patient awake    Reviewed: Allergy & Precautions, H&P , NPO status , Patient's Chart, lab work & pertinent test results  Airway Mallampati: I  TM Distance: >3 FB Neck ROM: Full    Dental no notable dental hx. (+) Teeth Intact, Missing, Dental Advisory Given, Poor Dentition   Pulmonary neg pulmonary ROS, former smoker,    Pulmonary exam normal breath sounds clear to auscultation       Cardiovascular Exercise Tolerance: Good hypertension, Pt. on medications negative cardio ROS Normal cardiovascular exam Rhythm:Regular Rate:Normal  ECHO 8/23  Left ventricular ejection fraction, by estimation, is 60 to 65%. The  left ventricle has normal function. The left ventricle has no regional  wall motion abnormalities. Left ventricular diastolic parameters were  normal.    Neuro/Psych negative neurological ROS  negative psych ROS   GI/Hepatic negative GI ROS, Neg liver ROS,   Endo/Other  negative endocrine ROS  Renal/GU negative Renal ROS  negative genitourinary   Musculoskeletal negative musculoskeletal ROS (+)   Abdominal   Peds negative pediatric ROS (+)  Hematology negative hematology ROS (+) HIV,   Anesthesia Other Findings   Reproductive/Obstetrics negative OB ROS                            Anesthesia Physical Anesthesia Plan  ASA: 3  Anesthesia Plan: General   Post-op Pain Management: Minimal or no pain anticipated and Toradol IV (intra-op)*   Induction: Intravenous  PONV Risk Score and Plan: 3 and Dexamethasone and Ondansetron  Airway Management Planned: LMA  Additional Equipment: None  Intra-op Plan:   Post-operative Plan:   Informed Consent: I have reviewed the patients History and Physical, chart, labs and discussed the procedure including the risks, benefits and alternatives for the proposed  anesthesia with the patient or authorized representative who has indicated his/her understanding and acceptance.       Plan Discussed with: CRNA and Anesthesiologist  Anesthesia Plan Comments: ( )       Anesthesia Quick Evaluation

## 2022-03-26 ENCOUNTER — Ambulatory Visit (HOSPITAL_BASED_OUTPATIENT_CLINIC_OR_DEPARTMENT_OTHER)
Admission: RE | Admit: 2022-03-26 | Discharge: 2022-03-26 | Disposition: A | Discharge status: Home / Self Care | Payer: Medicare HMO | Attending: Surgery | Admitting: Surgery

## 2022-03-26 ENCOUNTER — Encounter (HOSPITAL_BASED_OUTPATIENT_CLINIC_OR_DEPARTMENT_OTHER): Payer: Self-pay | Admitting: Surgery

## 2022-03-26 ENCOUNTER — Ambulatory Visit (HOSPITAL_BASED_OUTPATIENT_CLINIC_OR_DEPARTMENT_OTHER): Payer: Medicare HMO | Admitting: Anesthesiology

## 2022-03-26 ENCOUNTER — Other Ambulatory Visit: Payer: Self-pay

## 2022-03-26 ENCOUNTER — Ambulatory Visit (HOSPITAL_COMMUNITY): Payer: Medicare HMO

## 2022-03-26 ENCOUNTER — Telehealth: Payer: Self-pay | Admitting: Pharmacist

## 2022-03-26 ENCOUNTER — Encounter (HOSPITAL_BASED_OUTPATIENT_CLINIC_OR_DEPARTMENT_OTHER)
Admission: RE | Disposition: A | Discharge status: Home / Self Care | Payer: Self-pay | Source: Home / Self Care | Attending: Surgery

## 2022-03-26 ENCOUNTER — Other Ambulatory Visit (HOSPITAL_COMMUNITY): Payer: Self-pay

## 2022-03-26 DIAGNOSIS — I10 Essential (primary) hypertension: Secondary | ICD-10-CM

## 2022-03-26 DIAGNOSIS — Z170 Estrogen receptor positive status [ER+]: Secondary | ICD-10-CM | POA: Insufficient documentation

## 2022-03-26 DIAGNOSIS — Z21 Asymptomatic human immunodeficiency virus [HIV] infection status: Secondary | ICD-10-CM | POA: Diagnosis not present

## 2022-03-26 DIAGNOSIS — Z853 Personal history of malignant neoplasm of breast: Secondary | ICD-10-CM | POA: Insufficient documentation

## 2022-03-26 DIAGNOSIS — B20 Human immunodeficiency virus [HIV] disease: Secondary | ICD-10-CM

## 2022-03-26 DIAGNOSIS — Z87891 Personal history of nicotine dependence: Secondary | ICD-10-CM | POA: Insufficient documentation

## 2022-03-26 DIAGNOSIS — C773 Secondary and unspecified malignant neoplasm of axilla and upper limb lymph nodes: Secondary | ICD-10-CM | POA: Insufficient documentation

## 2022-03-26 DIAGNOSIS — C50412 Malignant neoplasm of upper-outer quadrant of left female breast: Secondary | ICD-10-CM | POA: Insufficient documentation

## 2022-03-26 DIAGNOSIS — C50912 Malignant neoplasm of unspecified site of left female breast: Secondary | ICD-10-CM

## 2022-03-26 HISTORY — PX: PORTACATH PLACEMENT: SHX2246

## 2022-03-26 SURGERY — INSERTION, TUNNELED CENTRAL VENOUS DEVICE, WITH PORT
Anesthesia: General | Site: Chest | Laterality: Right

## 2022-03-26 MED ORDER — KETOROLAC TROMETHAMINE 30 MG/ML IJ SOLN
INTRAMUSCULAR | Status: AC
  Filled 2022-03-26: qty 1

## 2022-03-26 MED ORDER — MEPERIDINE HCL 25 MG/ML IJ SOLN: 6.2500 mg | INTRAMUSCULAR | Status: DC | PRN

## 2022-03-26 MED ORDER — ONDANSETRON HCL 4 MG/2ML IJ SOLN
INTRAMUSCULAR | Status: AC
  Filled 2022-03-26: qty 2

## 2022-03-26 MED ORDER — CHLORHEXIDINE GLUCONATE CLOTH 2 % EX PADS: 6.0000 | MEDICATED_PAD | Freq: Once | CUTANEOUS | Status: DC

## 2022-03-26 MED ORDER — BUPIVACAINE-EPINEPHRINE (PF) 0.5% -1:200000 IJ SOLN
INTRAMUSCULAR | Status: AC
  Filled 2022-03-26: qty 30

## 2022-03-26 MED ORDER — ACETAMINOPHEN 325 MG PO TABS: 325.0000 mg | ORAL_TABLET | ORAL | Status: DC | PRN

## 2022-03-26 MED ORDER — FENTANYL CITRATE (PF) 100 MCG/2ML IJ SOLN: 25.0000 ug | INTRAMUSCULAR | Status: DC | PRN

## 2022-03-26 MED ORDER — PROPOFOL 10 MG/ML IV BOLUS
INTRAVENOUS | Status: DC | PRN
  Administered 2022-03-26: 120 mg via INTRAVENOUS

## 2022-03-26 MED ORDER — MIDAZOLAM HCL 5 MG/5ML IJ SOLN
INTRAMUSCULAR | Status: DC | PRN
  Administered 2022-03-26 (×2): 1 mg via INTRAVENOUS

## 2022-03-26 MED ORDER — OXYCODONE HCL 5 MG PO TABS: 5.0000 mg | ORAL_TABLET | Freq: Once | ORAL | Status: DC | PRN

## 2022-03-26 MED ORDER — ACETAMINOPHEN 500 MG PO TABS
1000.0000 mg | ORAL_TABLET | ORAL | Status: AC
  Administered 2022-03-26: 1000 mg via ORAL

## 2022-03-26 MED ORDER — HEPARIN SOD (PORK) LOCK FLUSH 100 UNIT/ML IV SOLN
INTRAVENOUS | Status: DC | PRN
  Administered 2022-03-26: 500 [IU] via INTRAVENOUS

## 2022-03-26 MED ORDER — FENTANYL CITRATE (PF) 100 MCG/2ML IJ SOLN
INTRAMUSCULAR | Status: DC | PRN
  Administered 2022-03-26: 50 ug via INTRAVENOUS

## 2022-03-26 MED ORDER — PROPOFOL 10 MG/ML IV BOLUS
INTRAVENOUS | Status: AC
  Filled 2022-03-26: qty 20

## 2022-03-26 MED ORDER — LACTATED RINGERS IV SOLN: INTRAVENOUS | Status: DC

## 2022-03-26 MED ORDER — DEXAMETHASONE SODIUM PHOSPHATE 4 MG/ML IJ SOLN
INTRAMUSCULAR | Status: DC | PRN
  Administered 2022-03-26: 5 mg via INTRAVENOUS

## 2022-03-26 MED ORDER — LIDOCAINE 2% (20 MG/ML) 5 ML SYRINGE
INTRAMUSCULAR | Status: AC
  Filled 2022-03-26: qty 5

## 2022-03-26 MED ORDER — BUPIVACAINE-EPINEPHRINE 0.5% -1:200000 IJ SOLN
INTRAMUSCULAR | Status: DC | PRN
  Administered 2022-03-26: 10 mL

## 2022-03-26 MED ORDER — CIPROFLOXACIN IN D5W 400 MG/200ML IV SOLN
INTRAVENOUS | Status: AC
  Filled 2022-03-26: qty 200

## 2022-03-26 MED ORDER — DEXAMETHASONE SODIUM PHOSPHATE 10 MG/ML IJ SOLN
INTRAMUSCULAR | Status: AC
  Filled 2022-03-26: qty 1

## 2022-03-26 MED ORDER — ACETAMINOPHEN 160 MG/5ML PO SOLN: 325.0000 mg | ORAL | Status: DC | PRN

## 2022-03-26 MED ORDER — HEPARIN (PORCINE) IN NACL 2-0.9 UNITS/ML
INTRAMUSCULAR | Status: AC | PRN
  Administered 2022-03-26: 1 via INTRAVENOUS

## 2022-03-26 MED ORDER — LIDOCAINE HCL (CARDIAC) PF 100 MG/5ML IV SOSY
PREFILLED_SYRINGE | INTRAVENOUS | Status: DC | PRN
  Administered 2022-03-26: 50 mg via INTRAVENOUS

## 2022-03-26 MED ORDER — ONDANSETRON HCL 4 MG/2ML IJ SOLN
INTRAMUSCULAR | Status: DC | PRN
  Administered 2022-03-26: 4 mg via INTRAVENOUS

## 2022-03-26 MED ORDER — TRAMADOL HCL 50 MG PO TABS: 50.0000 mg | ORAL_TABLET | Freq: Four times a day (QID) | ORAL | 0 refills | Status: DC | PRN

## 2022-03-26 MED ORDER — PHENYLEPHRINE HCL (PRESSORS) 10 MG/ML IV SOLN
INTRAVENOUS | Status: DC | PRN
  Administered 2022-03-26 (×3): 80 ug via INTRAVENOUS

## 2022-03-26 MED ORDER — OXYCODONE HCL 5 MG/5ML PO SOLN: 5.0000 mg | Freq: Once | ORAL | Status: DC | PRN

## 2022-03-26 MED ORDER — ONDANSETRON HCL 4 MG/2ML IJ SOLN: 4.0000 mg | Freq: Once | INTRAMUSCULAR | Status: DC | PRN

## 2022-03-26 MED ORDER — HEPARIN SOD (PORK) LOCK FLUSH 100 UNIT/ML IV SOLN
INTRAVENOUS | Status: AC
  Filled 2022-03-26: qty 5

## 2022-03-26 MED ORDER — MIDAZOLAM HCL 2 MG/2ML IJ SOLN
INTRAMUSCULAR | Status: AC
  Filled 2022-03-26: qty 2

## 2022-03-26 MED ORDER — HEPARIN (PORCINE) IN NACL 1000-0.9 UT/500ML-% IV SOLN
INTRAVENOUS | Status: AC
  Filled 2022-03-26: qty 500

## 2022-03-26 MED ORDER — ACETAMINOPHEN 500 MG PO TABS
ORAL_TABLET | ORAL | Status: AC
  Filled 2022-03-26: qty 2

## 2022-03-26 MED ORDER — CIPROFLOXACIN IN D5W 400 MG/200ML IV SOLN: 400.0000 mg | INTRAVENOUS | Status: DC

## 2022-03-26 MED ORDER — FENTANYL CITRATE (PF) 100 MCG/2ML IJ SOLN
INTRAMUSCULAR | Status: AC
  Filled 2022-03-26: qty 2

## 2022-03-26 MED FILL — Fosaprepitant Dimeglumine For IV Infusion 150 MG (Base Eq): INTRAVENOUS | Qty: 5 | Status: AC

## 2022-03-26 MED FILL — Dexamethasone Sodium Phosphate Inj 100 MG/10ML: INTRAMUSCULAR | Qty: 1 | Status: AC

## 2022-03-26 SURGICAL SUPPLY — 44 items
ADH SKN CLS APL DERMABOND .7 (GAUZE/BANDAGES/DRESSINGS) ×2
APL PRP STRL LF DISP 70% ISPRP (MISCELLANEOUS) ×1
BAG DECANTER FOR FLEXI CONT (MISCELLANEOUS) ×2 IMPLANT
BLADE SURG 15 STRL LF DISP TIS (BLADE) ×2 IMPLANT
BLADE SURG 15 STRL SS (BLADE) ×1
CANISTER SUCT 1200ML W/VALVE (MISCELLANEOUS) IMPLANT
CHLORAPREP W/TINT 26 (MISCELLANEOUS) ×2 IMPLANT
COVER BACK TABLE 60X90IN (DRAPES) ×2 IMPLANT
COVER MAYO STAND STRL (DRAPES) ×2 IMPLANT
DERMABOND ADVANCED (GAUZE/BANDAGES/DRESSINGS) ×2
DERMABOND ADVANCED .7 DNX12 (GAUZE/BANDAGES/DRESSINGS) ×4 IMPLANT
DRAPE C-ARM 42X72 X-RAY (DRAPES) ×2 IMPLANT
DRAPE LAPAROSCOPIC ABDOMINAL (DRAPES) ×2 IMPLANT
DRAPE UTILITY XL STRL (DRAPES) ×2 IMPLANT
ELECT REM PT RETURN 9FT ADLT (ELECTROSURGICAL) ×1
ELECTRODE REM PT RTRN 9FT ADLT (ELECTROSURGICAL) ×2 IMPLANT
GAUZE 4X4 16PLY ~~LOC~~+RFID DBL (SPONGE) ×2 IMPLANT
GLOVE BIOGEL PI IND STRL 6.5 (GLOVE) IMPLANT
GLOVE BIOGEL PI IND STRL 7.0 (GLOVE) IMPLANT
GLOVE BIOGEL PI INDICATOR 6.5 (GLOVE) ×1
GLOVE BIOGEL PI INDICATOR 7.0 (GLOVE) ×1
GLOVE SURG SIGNA 7.5 PF LTX (GLOVE) ×2 IMPLANT
GOWN STRL REUS W/ TWL LRG LVL3 (GOWN DISPOSABLE) ×2 IMPLANT
GOWN STRL REUS W/ TWL XL LVL3 (GOWN DISPOSABLE) ×2 IMPLANT
GOWN STRL REUS W/TWL LRG LVL3 (GOWN DISPOSABLE) ×1
GOWN STRL REUS W/TWL XL LVL3 (GOWN DISPOSABLE) ×1
IV KIT MINILOC 20X1 SAFETY (NEEDLE) IMPLANT
KIT PORT POWER 8FR ISP CVUE (Port) IMPLANT
NDL HYPO 25X1 1.5 SAFETY (NEEDLE) ×2 IMPLANT
NEEDLE HYPO 25X1 1.5 SAFETY (NEEDLE) ×1 IMPLANT
PACK BASIN DAY SURGERY FS (CUSTOM PROCEDURE TRAY) ×2 IMPLANT
PENCIL SMOKE EVACUATOR (MISCELLANEOUS) ×2 IMPLANT
SLEEVE SCD COMPRESS KNEE MED (STOCKING) ×2 IMPLANT
SPIKE FLUID TRANSFER (MISCELLANEOUS) IMPLANT
SUT MNCRL AB 4-0 PS2 18 (SUTURE) ×2 IMPLANT
SUT PROLENE 2 0 SH DA (SUTURE) ×2 IMPLANT
SUT SILK 2 0 TIES 17X18 (SUTURE)
SUT SILK 2-0 18XBRD TIE BLK (SUTURE) IMPLANT
SUT VIC AB 3-0 SH 27 (SUTURE) ×1
SUT VIC AB 3-0 SH 27X BRD (SUTURE) ×2 IMPLANT
SYR CONTROL 10ML LL (SYRINGE) ×2 IMPLANT
TOWEL GREEN STERILE FF (TOWEL DISPOSABLE) ×2 IMPLANT
TUBE CONNECTING 20X1/4 (TUBING) IMPLANT
YANKAUER SUCT BULB TIP NO VENT (SUCTIONS) IMPLANT

## 2022-03-26 NOTE — Interval H&P Note (Signed)
History and Physical Interval Note:no change in H and P  03/26/2022 7:11 AM  Veronica Robles  has presented today for surgery, with the diagnosis of LEFT BREAST CANCER.  The various methods of treatment have been discussed with the patient and family. After consideration of risks, benefits and other options for treatment, the patient has consented to  Procedure(s): INSERTION PORT-A-CATH (N/A) as a surgical intervention.  The patient's history has been reviewed, patient examined, no change in status, stable for surgery.  I have reviewed the patient's chart and labs.  Questions were answered to the patient's satisfaction.     [name]

## 2022-03-26 NOTE — Anesthesia Postprocedure Evaluation (Signed)
Anesthesia Post Note  Patient: Veronica Robles  Procedure(s) Performed: INSERTION PORT-A-CATH (Right: Chest)     Patient location during evaluation: PACU Anesthesia Type: General Level of consciousness: awake and alert Pain management: pain level controlled Vital Signs Assessment: post-procedure vital signs reviewed and stable Respiratory status: spontaneous breathing, nonlabored ventilation, respiratory function stable and patient connected to nasal cannula oxygen Cardiovascular status: blood pressure returned to baseline and stable Postop Assessment: no apparent nausea or vomiting Anesthetic complications: no   No notable events documented.  Last Vitals:  Vitals:   03/26/22 0900 03/26/22 1004  BP: 130/76 136/70  Pulse: 68 64  Resp: 16 16  Temp: 36.4 C 36.4 C  SpO2: 99% 97%    Last Pain:  Vitals:   03/26/22 1004  TempSrc: Oral  PainSc: 0-No pain                 Esaiah Wanless

## 2022-03-26 NOTE — Transfer of Care (Signed)
Immediate Anesthesia Transfer of Care Note  Patient: Veronica Robles  Procedure(s) Performed: INSERTION PORT-A-CATH (Right: Chest)  Patient Location: PACU  Anesthesia Type:General  Level of Consciousness: awake, alert  and oriented  Airway & Oxygen Therapy: Patient Spontanous Breathing and Patient connected to nasal cannula oxygen  Post-op Assessment: Report given to RN and Post -op Vital signs reviewed and stable  Post vital signs: Reviewed and stable  Last Vitals:  Vitals Value Taken Time  BP 130/73 03/26/22 0817  Temp    Pulse 71 03/26/22 0820  Resp 14 03/26/22 0820  SpO2 100 % 03/26/22 0820  Vitals shown include unvalidated device data.  Last Pain:  Vitals:   03/26/22 5374  TempSrc: Oral  PainSc: 0-No pain      Patients Stated Pain Goal: 4 (82/70/78 6754)  Complications: No notable events documented.

## 2022-03-26 NOTE — Anesthesia Procedure Notes (Signed)
Procedure Name: LMA Insertion Date/Time: 03/26/2022 7:27 AM  Performed by: Bufford Spikes, CRNAPre-anesthesia Checklist: Patient identified, Emergency Drugs available, Suction available and Patient being monitored Patient Re-evaluated:Patient Re-evaluated prior to induction Oxygen Delivery Method: Circle system utilized Preoxygenation: Pre-oxygenation with 100% oxygen Induction Type: IV induction Ventilation: Mask ventilation without difficulty LMA: LMA inserted LMA Size: 3.0 Number of attempts: 1 Placement Confirmation: positive ETCO2 Tube secured with: Tape Dental Injury: Teeth and Oropharynx as per pre-operative assessment

## 2022-03-26 NOTE — Telephone Encounter (Signed)
We have been trying to come up with a HIV regimen for Veronica Robles based on her resistance mutations and drug interactions with her chemotherapy that she will be starting tomorrow. Discussed the following with Veronica Robles, Veronica Robles, [facility], and Veronica Robles from [place] and [facility] --   Will continue her on Tivicay BID and start Descovy daily as well. Then either add Rukobia to her regimen or SQ Sunlenca q52month. Called Veronica Robles discuss and see which option she would be ok with. She wants time to think on this and asked that I call her back on Friday around 1pm, which I will do. If she decides on pursuing Sunlenca, the injection will need a PA. She will also need PAF support to help pay her medicare copays for the medications ([place]).   I asked her to stop all her HIV medications tonight. I do not want her to take Tivicay by itself, and I do not want her to take Evotaz or AZT with her chemotherapy. She agrees with the plan.  Will follow up on Friday.  Veronica Robles, PharmD, BCIDP, AAHIVP, CPP Clinical Pharmacist Practitioner [facility] 03/26/2022, 3:00 PM

## 2022-03-26 NOTE — Progress Notes (Signed)
Patient Care Team: [name], MD as PCP - General (Family Medicine) Carlyle Basques, MD as PCP - Infectious Diseases (Infectious Diseases) Rockwell Germany, RN as Oncology Nurse Navigator Mauro Kaufmann, RN as Oncology Nurse Navigator  DIAGNOSIS: No diagnosis found.  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; lymph node biopsy: Positive, breast biopsy: Grade 2 IDC ER 100%, PR 0%, HER2 negative, Ki-67 10%   03/10/2022 Cancer Staging   Staging form: Breast, AJCC 8th Edition - Clinical stage from 03/10/2022: Stage IIA (cT1b, cN1(f), cM0, G2, ER+, PR-, HER2-) - Signed by Hayden Pedro, PA-C on 03/10/2022 Stage prefix: Initial diagnosis Method of lymph node assessment: Core biopsy Histologic grading system: 3 grade system   03/27/2022 -  Chemotherapy   Patient is on Treatment Plan : BREAST ADJUVANT DOSE DENSE AC q14d / PACLitaxel q7d       CHIEF COMPLIANT: Follow-up cycle 1 AC  INTERVAL HISTORY: Veronica Robles is a 66 y.o. female is here because of recent diagnosis of left breast cancer. She presents to the clinic today for a follow-up and to start treatment.   ALLERGIES:  is allergic to penicillins.  MEDICATIONS:  Current Outpatient Medications  Medication Sig Dispense Refill   atazanavir-cobicistat (EVOTAZ) 300-150 MG tablet Swallow whole. Do NOT crush, cut or chew tablet. Take with food. 30 tablet 5   Blood Pressure Monitoring (ADULT BLOOD PRESSURE CUFF LG) KIT       dexamethasone (DECADRON) 4 MG tablet Take 1 tablet by mouth the day after chemo and 1 tablet 2 days after chemo; take with food 8 tablet 0   dolutegravir (TIVICAY) 50 MG tablet TAKE 1  TABLET(50 MG) BY MOUTH TWICE DAILY 60 tablet 5   hydrochlorothiazide (HYDRODIURIL) 25 MG tablet TAKE 1 TABLET(25 MG) BY MOUTH DAILY 90 tablet 3   lidocaine-prilocaine (EMLA) cream Apply to affected area once 30 g 3   ondansetron (ZOFRAN) 8 MG tablet Take 1 tablet (8 mg) by mouth every 8 hours as needed for nausea/vomiting. Start third day after doxorubicin/cyclophosphamide chemotherapy. 30 tablet 1   prochlorperazine (COMPAZINE) 10 MG tablet Take 1 tablet (10 mg total) by mouth every 6 (six) hours as needed for nausea or vomiting. 30 tablet 1   traMADol (ULTRAM) 50 MG tablet Take 1 tablet (50 mg total) by mouth every 6 (six) hours as needed for moderate pain or severe pain. 20 tablet 0   zidovudine (RETROVIR) 300 MG tablet Take 1 tablet (300 mg total) by mouth 2 (two) times daily. 60 tablet 5   No current facility-administered medications for this visit.   Facility-Administered Medications Ordered in Other Visits  Medication Dose Route Frequency Provider Last Rate Last Admin   acetaminophen (TYLENOL) tablet 325-650 mg  325-650 mg Oral Q4H PRN Janeece Riggers, MD       Or   acetaminophen (TYLENOL) 160 MG/5ML solution 325-650 mg  325-650 mg Oral Q4H PRN Janeece Riggers, MD       Chlorhexidine Gluconate Cloth 2 % PADS 6 each  6 each Topical Once [name], MD       And   Chlorhexidine Gluconate Cloth 2 % PADS 6 each  6 each Topical Once [name], MD  ciprofloxacin (CIPRO) IVPB 400 mg  400 mg Intravenous Q12H Kathie Rhodes, MD   400 mg at 03/26/22 1062   ciprofloxacin (CIPRO) IVPB 400 mg  400 mg Intravenous On Call to OR [name], MD       fentaNYL (SUBLIMAZE) injection 25-50 mcg  25-50 mcg Intravenous Q5 min PRN Janeece Riggers, MD       lactated ringers infusion   Intravenous Continuous Brennan Bailey, MD   Stopped at 03/26/22 6948   meperidine (DEMEROL) injection 6.25-12.5 mg  6.25-12.5 mg Intravenous Q5 min PRN Janeece Riggers, MD       ondansetron [facility])  injection 4 mg  4 mg Intravenous Once PRN Janeece Riggers, MD       oxyCODONE (Oxy IR/ROXICODONE) immediate release tablet 5 mg  5 mg Oral Once PRN Janeece Riggers, MD       Or   oxyCODONE (ROXICODONE) 5 MG/5ML solution 5 mg  5 mg Oral Once PRN Janeece Riggers, MD        PHYSICAL EXAMINATION: ECOG PERFORMANCE STATUS: {CHL ONC ECOG NI:[id number]}  There were no vitals filed for this visit. There were no vitals filed for this visit.  BREAST:*** No palpable masses or nodules in either right or left breasts. No palpable axillary supraclavicular or infraclavicular adenopathy no breast tenderness or nipple discharge. (exam performed in the presence of a chaperone)  LABORATORY DATA:  I have reviewed the data as listed    Latest Ref Rng & Units 03/12/2022   12:15 PM 08/29/2021    2:08 PM 05/23/2021    1:55 PM  CMP  Glucose 70 - 99 mg/dL 100  95  87   BUN 8 - 23 mg/dL '11  11  12   ' Creatinine 0.44 - 1.00 mg/dL 0.97  1.02  0.96   Sodium 135 - 145 mmol/L 142  140  140   Potassium 3.5 - 5.1 mmol/L 3.8  4.0  3.4   Chloride 98 - 111 mmol/L 106  102  101   CO2 22 - 32 mmol/L 30  31  32   Calcium 8.9 - 10.3 mg/dL 9.8  9.7  9.5   Total Protein 6.5 - 8.1 g/dL 8.1     Total Bilirubin 0.3 - 1.2 mg/dL 0.7     Alkaline Phos 38 - 126 U/L 61     AST 15 - 41 U/L 25     ALT 0 - 44 U/L 17       Lab Results  Component Value Date   WBC 4.9 03/12/2022   HGB 12.8 03/12/2022   HCT 38.6 03/12/2022   MCV 88.1 03/12/2022   PLT 154 03/12/2022   NEUTROABS 2.9 03/12/2022    ASSESSMENT & PLAN:  No problem-specific Assessment & Plan notes found for this encounter.    No orders of the defined types were placed in this encounter.  The patient has a good understanding of the overall plan. she agrees with it. she will call with any problems that may develop before the next visit here. Total time spent: 30 mins including face to face time and time spent for planning, charting and co-ordination of care   Suzzette Righter, [place] 03/26/22    I Gardiner Coins am scribing for Dr. Lindi Adie  ***

## 2022-03-26 NOTE — Op Note (Addendum)
   Veronica Robles 03/26/2022   Pre-op Diagnosis: LEFT BREAST CANCER     Post-op Diagnosis: same  Procedure(s): ULTRASOUND GUIDED INSERTION 8FR PORT-A-CATH RIGHT INTERNAL JUGULAR VEIN  Surgeon(s): [name], MD  Anesthesia: General  Staff:  Circulator: Maurene Capes, RN; Anson Crofts, RN Scrub Person: Izora Ribas, RN  Estimated Blood Loss: Minimal               Procedure: The patient was brought to the operating room and identifies correct patient.  She was placed upon on the operating table general anesthesia was induced.  A shoulder roll was placed behind her shoulders.  Her right chest and neck were then prepped and draped in usual sterile fashion.  I anesthetized the skin of the right chest with Marcaine.  Using the introducer needle I then tried to cannulate the right subclavian vein.  I was able to cannulate the vein but could not pass the wire.  I made another attempt and cannulated the subclavian artery so the decision was then made to place the port in the jugular vein.  Under ultrasound guidance identified the right internal jugular vein.  I cannulated the vein with the introducer needle and then easily passed the wire through the needle and into the central venous system under fluoroscopy.  I made an incision on the neck at the wire site with a scalpel and then made an incision on the right upper chest for the port pocket.  I dissected down to the chest wall at the port pocket site with the cautery and then created a pocket for the port.  An 8 French [place] port was brought onto the field.  The port and catheter were flushed.  The catheter was attached to the port.  I placed the port into the pocket on the chest and then tunneled the catheter over the clavicle and up to the wire site in the neck.  The introducer sheath and dilator were passed over the wire and into the central venous system.  The wire and dilator were removed.  The catheter was at an  appropriate length.  It was then fed down the peel-away sheath and into the central venous system.  Fluoroscopy confirmed placement at the top of the superior vena cava.  The port was accessed and good flush and return were demonstrated.  I sutured the port to the chest wall with 2 separate Prolene sutures.  The subcutaneous tissue was then closed with interrupted 3-0 Vicryl sutures and the skin was closed with running 4-0 Monocryl.  I was a small incision at the neck with sutures as well.  I accessed the port through the skin and again turn and flush were demonstrated.  I then instilled concentrated heparin solution into the port which was left accessed.  Dermabond and occlusive dressing were then applied.  The patient tolerated the procedure well.  All the counts were correct at the end of the procedure.  The patient was then extubated in the operating room and taken in a stable condition to the recovery room.          [name]   Date: 03/26/2022  Time: 8:15 AM

## 2022-03-26 NOTE — Discharge Instructions (Addendum)
Leave the bandages on the Port-A-Cath site today  You may use ice pack on her neck and may use Tylenol and ibuprofen for pain  A prescription for stronger pain medicine was sent to your pharmacy which you may use if needed  After chemotherapy tomorrow, you may shower.  No vigorous activity for 1 week   No Tylenol before 12:45pm if needed.  Post Anesthesia Home Care Instructions  Activity: Get plenty of rest for the remainder of the day. A responsible individual must stay with you for 24 hours following the procedure.  For the next 24 hours, DO NOT: -Drive a car -Paediatric nurse -Drink alcoholic beverages -Take any medication unless instructed by your physician -Make any legal decisions or sign important papers.  Meals: Start with liquid foods such as gelatin or soup. Progress to regular foods as tolerated. Avoid greasy, spicy, heavy foods. If nausea and/or vomiting occur, drink only clear liquids until the nausea and/or vomiting subsides. Call your physician if vomiting continues.  Special Instructions/Symptoms: Your throat may feel dry or sore from the anesthesia or the breathing tube placed in your throat during surgery. If this causes discomfort, gargle with warm salt water. The discomfort should disappear within 24 hours.  If you had a scopolamine patch placed behind your ear for the management of post- operative nausea and/or vomiting:  1. The medication in the patch is effective for 72 hours, after which it should be removed.  Wrap patch in a tissue and discard in the trash. Wash hands thoroughly with soap and water. 2. You may remove the patch earlier than 72 hours if you experience unpleasant side effects which may include dry mouth, dizziness or visual disturbances. 3. Avoid touching the patch. Wash your hands with soap and water after contact with the patch.

## 2022-03-27 ENCOUNTER — Other Ambulatory Visit: Payer: Self-pay | Admitting: Hematology and Oncology

## 2022-03-27 ENCOUNTER — Inpatient Hospital Stay: Payer: Medicare HMO

## 2022-03-27 ENCOUNTER — Encounter: Payer: Self-pay | Admitting: *Deleted

## 2022-03-27 ENCOUNTER — Telehealth: Payer: Self-pay

## 2022-03-27 ENCOUNTER — Inpatient Hospital Stay (HOSPITAL_BASED_OUTPATIENT_CLINIC_OR_DEPARTMENT_OTHER): Payer: Medicare HMO | Admitting: Hematology and Oncology

## 2022-03-27 VITALS — BP 141/73 | HR 77 | Resp 17

## 2022-03-27 DIAGNOSIS — Z5111 Encounter for antineoplastic chemotherapy: Secondary | ICD-10-CM | POA: Diagnosis not present

## 2022-03-27 DIAGNOSIS — C50412 Malignant neoplasm of upper-outer quadrant of left female breast: Secondary | ICD-10-CM

## 2022-03-27 DIAGNOSIS — Z170 Estrogen receptor positive status [ER+]: Secondary | ICD-10-CM

## 2022-03-27 LAB — CBC WITH DIFFERENTIAL (CANCER CENTER ONLY)
Abs Immature Granulocytes: 0.04 10*3/uL (ref 0.00–0.07)
Basophils Absolute: 0 10*3/uL (ref 0.0–0.1)
Basophils Relative: 0 %
Eosinophils Absolute: 0 10*3/uL (ref 0.0–0.5)
Eosinophils Relative: 0 %
HCT: 35 % — ABNORMAL LOW (ref 36.0–46.0)
Hemoglobin: 11.8 g/dL — ABNORMAL LOW (ref 12.0–15.0)
Immature Granulocytes: 0 %
Lymphocytes Relative: 10 %
Lymphs Abs: 1 10*3/uL (ref 0.7–4.0)
MCH: 29.9 pg (ref 26.0–34.0)
MCHC: 33.7 g/dL (ref 30.0–36.0)
MCV: 88.6 fL (ref 80.0–100.0)
Monocytes Absolute: 1 10*3/uL (ref 0.1–1.0)
Monocytes Relative: 10 %
Neutro Abs: 8.5 10*3/uL — ABNORMAL HIGH (ref 1.7–7.7)
Neutrophils Relative %: 80 %
Platelet Count: 181 10*3/uL (ref 150–400)
RBC: 3.95 MIL/uL (ref 3.87–5.11)
RDW: 14.2 % (ref 11.5–15.5)
WBC Count: 10.7 10*3/uL — ABNORMAL HIGH (ref 4.0–10.5)
nRBC: 0 % (ref 0.0–0.2)

## 2022-03-27 LAB — CMP (CANCER CENTER ONLY)
ALT: 13 U/L (ref 0–44)
AST: 19 U/L (ref 15–41)
Albumin: 4.3 g/dL (ref 3.5–5.0)
Alkaline Phosphatase: 60 U/L (ref 38–126)
Anion gap: 5 (ref 5–15)
BUN: 14 mg/dL (ref 8–23)
CO2: 31 mmol/L (ref 22–32)
Calcium: 9.9 mg/dL (ref 8.9–10.3)
Chloride: 104 mmol/L (ref 98–111)
Creatinine: 0.89 mg/dL (ref 0.44–1.00)
GFR, Estimated: >60 mL/min (ref >60)
Glucose, Bld: 125 mg/dL — ABNORMAL HIGH (ref 70–99)
Potassium: 3.6 mmol/L (ref 3.5–5.1)
Sodium: 140 mmol/L (ref 135–145)
Total Bilirubin: 0.4 mg/dL (ref 0.3–1.2)
Total Protein: 7.6 g/dL (ref 6.5–8.1)

## 2022-03-27 MED ORDER — SODIUM CHLORIDE 0.9 % IV SOLN
150.0000 mg | Freq: Once | INTRAVENOUS | Status: AC
  Administered 2022-03-27: 150 mg via INTRAVENOUS
  Filled 2022-03-27: qty 150

## 2022-03-27 MED ORDER — SODIUM CHLORIDE 0.9% FLUSH
10.0000 mL | INTRAVENOUS | Status: AC | PRN
  Administered 2022-03-27: 10 mL

## 2022-03-27 MED ORDER — SODIUM CHLORIDE 0.9 % IV SOLN: Freq: Once | INTRAVENOUS | Status: AC

## 2022-03-27 MED ORDER — HEPARIN SOD (PORK) LOCK FLUSH 100 UNIT/ML IV SOLN
500.0000 [IU] | Freq: Once | INTRAVENOUS | Status: AC | PRN
  Administered 2022-03-27: 500 [IU]

## 2022-03-27 MED ORDER — PALONOSETRON HCL INJECTION 0.25 MG/5ML
0.2500 mg | Freq: Once | INTRAVENOUS | Status: AC
  Administered 2022-03-27: 0.25 mg via INTRAVENOUS
  Filled 2022-03-27: qty 5

## 2022-03-27 MED ORDER — SODIUM CHLORIDE 0.9 % IV SOLN
10.0000 mg | Freq: Once | INTRAVENOUS | Status: AC
  Administered 2022-03-27: 10 mg via INTRAVENOUS
  Filled 2022-03-27: qty 10

## 2022-03-27 MED ORDER — DOXORUBICIN HCL CHEMO IV INJECTION 2 MG/ML
60.0000 mg/m2 | Freq: Once | INTRAVENOUS | Status: AC
  Administered 2022-03-27: 96 mg via INTRAVENOUS
  Filled 2022-03-27: qty 48

## 2022-03-27 MED ORDER — SODIUM CHLORIDE 0.9 % IV SOLN
600.0000 mg/m2 | Freq: Once | INTRAVENOUS | Status: AC
  Administered 2022-03-27: 960 mg via INTRAVENOUS
  Filled 2022-03-27: qty 48

## 2022-03-27 MED ORDER — SODIUM CHLORIDE 0.9% FLUSH
10.0000 mL | INTRAVENOUS | Status: DC | PRN
  Administered 2022-03-27: 10 mL

## 2022-03-27 NOTE — Assessment & Plan Note (Signed)
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; lymph node biopsy: Positive, breast biopsy: Grade 2 IDC ER 100%, PR 0%, HER2 negative, Ki-67 10%  Treatment plan: 1.  Neoadjuvant chemotherapy with dose dense Adriamycin and Cytoxan x4 followed by Taxol weekly x12 2. breast conserving surgery with targeted node dissection 3.  Adjuvant radiation 4.  Follow-up antiestrogen therapy with abemaciclib --------------------------------------------------------------------------------------------------------------------------------- Current treatment: Cycle 1 day 1 dose dense Adriamycin and Cytoxan Labs reviewed, chemo education completed, chemo consent obtained, antiemetics were reviewed  Return to clinic in 1 week for toxicity check

## 2022-03-27 NOTE — Patient Instructions (Addendum)
[facility]  Discharge Instructions: Thank you for choosing [facility] to provide your oncology and hematology care.   If you have a lab appointment with the [facility], please go directly to the [facility] and check in at the registration area.   Wear comfortable clothing and clothing appropriate for easy access to any Portacath or PICC line.   We strive to give you quality time with your provider. You may need to reschedule your appointment if you arrive late (15 or more minutes).  Arriving late affects you and other patients whose appointments are after yours.  Also, if you miss three or more appointments without notifying the office, you may be dismissed from the clinic at the provider's discretion.      For prescription refill requests, have your pharmacy contact our office and allow 72 hours for refills to be completed.    Today you received the following chemotherapy and/or immunotherapy agents: Adriamycin/Cytoxan      To help prevent nausea and vomiting after your treatment, we encourage you to take your nausea medication as directed.  BELOW ARE SYMPTOMS THAT SHOULD BE REPORTED IMMEDIATELY: *FEVER GREATER THAN 100.4 F (38 C) OR HIGHER *CHILLS OR SWEATING *NAUSEA AND VOMITING THAT IS NOT CONTROLLED WITH YOUR NAUSEA MEDICATION *UNUSUAL SHORTNESS OF BREATH *UNUSUAL BRUISING OR BLEEDING *URINARY PROBLEMS (pain or burning when urinating, or frequent urination) *BOWEL PROBLEMS (unusual diarrhea, constipation, pain near the anus) TENDERNESS IN MOUTH AND THROAT WITH OR WITHOUT PRESENCE OF ULCERS (sore throat, sores in mouth, or a toothache) UNUSUAL RASH, SWELLING OR PAIN  UNUSUAL VAGINAL DISCHARGE OR ITCHING   Items with * indicate a potential emergency and should be followed up as soon as possible or go to the Emergency Department if any problems should occur.  Please show the CHEMOTHERAPY ALERT CARD or IMMUNOTHERAPY ALERT CARD at  check-in to the Emergency Department and triage nurse.  Should you have questions after your visit or need to cancel or reschedule your appointment, please contact [facility]  Dept: [phone]  and follow the prompts.  Office hours are 8:00 a.m. to 4:30 p.m. Monday - Friday. Please note that voicemails left after 4:00 p.m. may not be returned until the following business day.  We are closed weekends and major holidays. You have access to a nurse at all times for urgent questions. Please call the main number to the clinic Dept: [phone] and follow the prompts.   For any non-urgent questions, you may also contact your provider using MyChart. We now offer e-Visits for anyone 20 and older to request care online for non-urgent symptoms. For details visit [email or web address].   Also download the MyChart app! Go to the app store, search "MyChart", open the app, select [facility], and log in with your MyChart username and password.  Masks are optional in the cancer centers. If you would like for your care team to wear a mask while they are taking care of you, please let them know. You may have one support person who is at least 66 years old accompany you for your appointments. Doxorubicin Injection What is this medication? DOXORUBICIN (dox oh ROO bi sin) treats some types of cancer. It works by slowing down the growth of cancer cells. This medicine may be used for other purposes; ask your health care provider or pharmacist if you have questions. COMMON BRAND NAME(S): Adriamycin, Adriamycin PFS, Adriamycin RDF, Rubex What should I tell my care team  before I take this medication? They need to know if you have any of these conditions: Heart disease History of low blood cell levels caused by a medication Liver disease Recent or ongoing radiation An unusual or allergic reaction to doxorubicin, other medications, foods, dyes, or preservatives If you or your partner  are pregnant or trying to get pregnant Breast-feeding How should I use this medication? This medication is injected into a vein. It is given by your care team in a hospital or clinic setting. Talk to your care team about the use of this medication in children. Special care may be needed. Overdosage: If you think you have taken too much of this medicine contact a poison control center or emergency room at once. NOTE: This medicine is only for you. Do not share this medicine with others. What if I miss a dose? Keep appointments for follow-up doses. It is important not to miss your dose. Call your care team if you are unable to keep an appointment. What may interact with this medication? 6-mercaptopurine Paclitaxel Phenytoin St. John's wort Trastuzumab Verapamil This list may not describe all possible interactions. Give your health care provider a list of all the medicines, herbs, non-prescription drugs, or dietary supplements you use. Also tell them if you smoke, drink alcohol, or use illegal drugs. Some items may interact with your medicine. What should I watch for while using this medication? Your condition will be monitored carefully while you are receiving this medication. You may need blood work while taking this medication. This medication may make you feel generally unwell. This is not uncommon as chemotherapy can affect healthy cells as well as cancer cells. Report any side effects. Continue your course of treatment even though you feel ill unless your care team tells you to stop. There is a maximum amount of this medication you should receive throughout your life. The amount depends on the medical condition being treated and your overall health. Your care team will watch how much of this medication you receive. Tell your care team if you have taken this medication before. Your urine may turn red for a few days after your dose. This is not blood. If your urine is dark or brown, call your  care team. In some cases, you may be given additional medications to help with side effects. Follow all directions for their use. This medication may increase your risk of getting an infection. Call your care team for advice if you get a fever, chills, sore throat, or other symptoms of a cold or flu. Do not treat yourself. Try to avoid being around people who are sick. This medication may increase your risk to bruise or bleed. Call your care team if you notice any unusual bleeding. Talk to your care team about your risk of cancer. You may be more at risk for certain types of cancers if you take this medication. You should make sure that you get enough Coenzyme Q10 while you are taking this medication. Discuss the foods you eat and the vitamins you take with your care team. Talk to your care team if you or your partner may be pregnant. Serious birth defects can occur if you take this medication during pregnancy and for 6 months after the last dose. Contraception is recommended while taking this medication and for 6 months after the last dose. Your care team can help you find the option that works for you. If your partner can get pregnant, use a condom while taking this medication  and for 6 months after the last dose. Do not breastfeed while taking this medication. This medication may cause infertility. Talk to your care team if you are concerned about your fertility. What side effects may I notice from receiving this medication? Side effects that you should report to your care team as soon as possible: Allergic reactions--skin rash, itching, hives, swelling of the face, lips, tongue, or throat Heart failure--shortness of breath, swelling of the ankles, feet, or hands, sudden weight gain, unusual weakness or fatigue Heart rhythm changes--fast or irregular heartbeat, dizziness, feeling faint or lightheaded, chest pain, trouble breathing Infection--fever, chills, cough, sore throat, wounds that don't  heal, pain or trouble when passing urine, general feeling of discomfort or being unwell Low red blood cell level--unusual weakness or fatigue, dizziness, headache, trouble breathing Painful swelling, warmth, or redness of the skin, blisters or sores at the infusion site Unusual bruising or bleeding Side effects that usually do not require medical attention (report to your care team if they continue or are bothersome): Diarrhea Hair loss Nausea Pain, redness, or swelling with sores inside the mouth or throat Red urine This list may not describe all possible side effects. Call your doctor for medical advice about side effects. You may report side effects to FDA at 1-800-FDA-1088. Where should I keep my medication? This medication is given in a hospital or clinic. It will not be stored at home. NOTE: This sheet is a summary. It may not cover all possible information. If you have questions about this medicine, talk to your doctor, pharmacist, or health care provider.  2023 Elsevier/Gold Standard (2021-11-20 00:00:00) Cyclophosphamide Injection What is this medication? CYCLOPHOSPHAMIDE (sye kloe FOSS fa mide) treats some types of cancer. It works by slowing down the growth of cancer cells. This medicine may be used for other purposes; ask your health care provider or pharmacist if you have questions. COMMON BRAND NAME(S): Cyclophosphamide, Cytoxan, Neosar What should I tell my care team before I take this medication? They need to know if you have any of these conditions: Heart disease Irregular heartbeat or rhythm Infection Kidney problems Liver disease Low blood cell levels (white cells, platelets, or red blood cells) Lung disease Previous radiation Trouble passing urine An unusual or allergic reaction to cyclophosphamide, other medications, foods, dyes, or preservatives Pregnant or trying to get pregnant Breast-feeding How should I use this medication? This medication is injected  into a vein. It is given by your care team in a hospital or clinic setting. Talk to your care team about the use of this medication in children. Special care may be needed. Overdosage: If you think you have taken too much of this medicine contact a poison control center or emergency room at once. NOTE: This medicine is only for you. Do not share this medicine with others. What if I miss a dose? Keep appointments for follow-up doses. It is important not to miss your dose. Call your care team if you are unable to keep an appointment. What may interact with this medication? Amphotericin B Amiodarone Azathioprine Certain antivirals for HIV or hepatitis Certain medications for blood pressure, such as enalapril, lisinopril, quinapril Cyclosporine Diuretics Etanercept Indomethacin Medications that relax muscles Metronidazole Natalizumab Tamoxifen Warfarin This list may not describe all possible interactions. Give your health care provider a list of all the medicines, herbs, non-prescription drugs, or dietary supplements you use. Also tell them if you smoke, drink alcohol, or use illegal drugs. Some items may interact with your medicine. What should I  watch for while using this medication? This medication may make you feel generally unwell. This is not uncommon as chemotherapy can affect healthy cells as well as cancer cells. Report any side effects. Continue your course of treatment even though you feel ill unless your care team tells you to stop. You may need blood work while you are taking this medication. This medication may increase your risk of getting an infection. Call your care team for advice if you get a fever, chills, sore throat, or other symptoms of a cold or flu. Do not treat yourself. Try to avoid being around people who are sick. Avoid taking medications that contain aspirin, acetaminophen, ibuprofen, naproxen, or ketoprofen unless instructed by your care team. These medications  may hide a fever. Be careful brushing or flossing your teeth or using a toothpick because you may get an infection or bleed more easily. If you have any dental work done, tell your dentist you are receiving this medication. Drink water or other fluids as directed. Urinate often, even at night. Some products may contain alcohol. Ask your care team if this medication contains alcohol. Be sure to tell all care teams you are taking this medicine. Certain medicines, like metronidazole and disulfiram, can cause an unpleasant reaction when taken with alcohol. The reaction includes flushing, headache, nausea, vomiting, sweating, and increased thirst. The reaction can last from 30 minutes to several hours. Talk to your care team if you wish to become pregnant or think you might be pregnant. This medication can cause serious birth defects if taken during pregnancy and for 1 year after the last dose. A negative pregnancy test is required before starting this medication. A reliable form of contraception is recommended while taking this medication and for 1 year after the last dose. Talk to your care team about reliable forms of contraception. Do not father a child while taking this medication and for 4 months after the last dose. Use a condom during this time period. Do not breast-feed while taking this medication or for 1 week after the last dose. This medication may cause infertility. Talk to your care team if you are concerned about your fertility. Talk to your care team about your risk of cancer. You may be more at risk for certain types of cancer if you take this medication. What side effects may I notice from receiving this medication? Side effects that you should report to your care team as soon as possible: Allergic reactions--skin rash, itching, hives, swelling of the face, lips, tongue, or throat Dry cough, shortness of breath or trouble breathing Heart failure--shortness of breath, swelling of the ankles,  feet, or hands, sudden weight gain, unusual weakness or fatigue Heart muscle inflammation--unusual weakness or fatigue, shortness of breath, chest pain, fast or irregular heartbeat, dizziness, swelling of the ankles, feet, or hands Heart rhythm changes--fast or irregular heartbeat, dizziness, feeling faint or lightheaded, chest pain, trouble breathing Infection--fever, chills, cough, sore throat, wounds that don't heal, pain or trouble when passing urine, general feeling of discomfort or being unwell Kidney injury--decrease in the amount of urine, swelling of the ankles, hands, or feet Liver injury--right upper belly pain, loss of appetite, nausea, light-colored stool, dark yellow or brown urine, yellowing skin or eyes, unusual weakness or fatigue Low red blood cell level--unusual weakness or fatigue, dizziness, headache, trouble breathing Low sodium level--muscle weakness, fatigue, dizziness, headache, confusion Red or dark brown urine Unusual bruising or bleeding Side effects that usually do not require medical attention (report to  your care team if they continue or are bothersome): Hair loss Irregular menstrual cycles or spotting Loss of appetite Nausea Pain, redness, or swelling with sores inside the mouth or throat Vomiting This list may not describe all possible side effects. Call your doctor for medical advice about side effects. You may report side effects to FDA at 1-800-FDA-1088. Where should I keep my medication? This medication is given in a hospital or clinic. It will not be stored at home. NOTE: This sheet is a summary. It may not cover all possible information. If you have questions about this medicine, talk to your doctor, pharmacist, or health care provider.  2023 Elsevier/Gold Standard (2021-10-23 00:00:00)

## 2022-03-27 NOTE — Telephone Encounter (Signed)
Veronica Robles, counseling intern, phoned client to check in on how they were feeling, emotionally.   The patient answered the phone and reported they were about to receive their first round of chemo. Patient was with son.   Counselor and patient are scheduled to speak again via phone Thursday, September 7th at 10 am.   Veronica Robles  Counseling Intern

## 2022-03-27 NOTE — Research (Signed)
DCP-001: Use of a Clinical Trial Screening Tool to Address Cancer Health Disparities in the [facility])  Patient Veronica Robles was identified by this RN as a potential candidate for the above listed study. This Clinical Research Nurse met with Veronica Robles, [id number], on 03/27/22 in a manner and location that ensures patient privacy to discuss participation in the above listed research study. Patient is Accompanied by a family member . A copy of the informed consent document and separate HIPAA Authorization was provided to the patient. Patient reads, speaks, and understands [place].    Patient was provided with the business card of this Nurse and encouraged to contact the research team with any questions. Approximately 10 minutes were spent with the patient reviewing the informed consent documents. Patient was provided the option of taking informed consent documents home to review and was encouraged to review at their convenience with their support network, including other care providers. Patient took the consent documents home to review.  Patient stated at this time she is overwhelmed, but will contact research if she would like to participate. Informed patient that there is a 30 day window to participate in above-listed study. Patient verbalized understanding. Patient thanked for her time.  Vickii Penna, RN, BSN, CPN Clinical Research Nurse I [phone]  03/27/2022 12:19 PM

## 2022-03-28 ENCOUNTER — Encounter (HOSPITAL_BASED_OUTPATIENT_CLINIC_OR_DEPARTMENT_OTHER): Payer: Self-pay | Admitting: Surgery

## 2022-03-28 ENCOUNTER — Ambulatory Visit
Admission: RE | Admit: 2022-03-28 | Discharge: 2022-03-28 | Disposition: A | Discharge status: Home / Self Care | Payer: Medicare HMO | Source: Ambulatory Visit | Attending: Hematology and Oncology | Admitting: Hematology and Oncology

## 2022-03-28 ENCOUNTER — Telehealth: Payer: Self-pay | Admitting: Pharmacist

## 2022-03-28 DIAGNOSIS — Z170 Estrogen receptor positive status [ER+]: Secondary | ICD-10-CM

## 2022-03-28 MED ORDER — GADOBUTROL 1 MMOL/ML IV SOLN: 5.0000 mL | Freq: Once | INTRAVENOUS | Status: DC | PRN

## 2022-03-28 NOTE — Telephone Encounter (Signed)
Followed up with Veronica Robles today. She will come and see Dr. Baxter Flattery and myself next Tuesday 9/5 at 145pm to discuss her ART.  She said she was feeling pretty good after her chemo yesterday. Answered all questions. Will see her on Tuesday.  Veronica Robles, PharmD [facility] Clinical Pharmacist Practitioner

## 2022-03-28 NOTE — Telephone Encounter (Signed)
Patient called back about medication change would like to discuss this with Dr. Baxter Flattery today if possible. Will forward message to pharmacy team and Dr. Baxter Flattery to call patient later today.  Does not want office to leave detail voicemail. Leatrice Jewels, RMA

## 2022-03-29 ENCOUNTER — Inpatient Hospital Stay: Payer: Medicare HMO | Attending: Hematology and Oncology

## 2022-03-29 VITALS — BP 159/74 | HR 82 | Temp 97.7°F | Resp 16

## 2022-03-29 DIAGNOSIS — Z79899 Other long term (current) drug therapy: Secondary | ICD-10-CM | POA: Insufficient documentation

## 2022-03-29 DIAGNOSIS — Z801 Family history of malignant neoplasm of trachea, bronchus and lung: Secondary | ICD-10-CM | POA: Insufficient documentation

## 2022-03-29 DIAGNOSIS — Z5111 Encounter for antineoplastic chemotherapy: Secondary | ICD-10-CM | POA: Diagnosis present

## 2022-03-29 DIAGNOSIS — Z800 Family history of malignant neoplasm of digestive organs: Secondary | ICD-10-CM | POA: Diagnosis not present

## 2022-03-29 DIAGNOSIS — Z8249 Family history of ischemic heart disease and other diseases of the circulatory system: Secondary | ICD-10-CM | POA: Insufficient documentation

## 2022-03-29 DIAGNOSIS — Z880 Allergy status to penicillin: Secondary | ICD-10-CM | POA: Diagnosis not present

## 2022-03-29 DIAGNOSIS — B20 Human immunodeficiency virus [HIV] disease: Secondary | ICD-10-CM | POA: Diagnosis not present

## 2022-03-29 DIAGNOSIS — Z170 Estrogen receptor positive status [ER+]: Secondary | ICD-10-CM | POA: Insufficient documentation

## 2022-03-29 DIAGNOSIS — R5383 Other fatigue: Secondary | ICD-10-CM | POA: Diagnosis not present

## 2022-03-29 DIAGNOSIS — Z5189 Encounter for other specified aftercare: Secondary | ICD-10-CM | POA: Diagnosis not present

## 2022-03-29 DIAGNOSIS — I10 Essential (primary) hypertension: Secondary | ICD-10-CM | POA: Diagnosis not present

## 2022-03-29 DIAGNOSIS — R110 Nausea: Secondary | ICD-10-CM | POA: Insufficient documentation

## 2022-03-29 DIAGNOSIS — F419 Anxiety disorder, unspecified: Secondary | ICD-10-CM | POA: Insufficient documentation

## 2022-03-29 DIAGNOSIS — T451X5A Adverse effect of antineoplastic and immunosuppressive drugs, initial encounter: Secondary | ICD-10-CM | POA: Insufficient documentation

## 2022-03-29 DIAGNOSIS — C50412 Malignant neoplasm of upper-outer quadrant of left female breast: Secondary | ICD-10-CM | POA: Insufficient documentation

## 2022-03-29 DIAGNOSIS — Z803 Family history of malignant neoplasm of breast: Secondary | ICD-10-CM | POA: Insufficient documentation

## 2022-03-29 DIAGNOSIS — Z9049 Acquired absence of other specified parts of digestive tract: Secondary | ICD-10-CM | POA: Insufficient documentation

## 2022-03-29 DIAGNOSIS — Z7952 Long term (current) use of systemic steroids: Secondary | ICD-10-CM | POA: Diagnosis not present

## 2022-03-29 DIAGNOSIS — D709 Neutropenia, unspecified: Secondary | ICD-10-CM | POA: Diagnosis not present

## 2022-03-29 DIAGNOSIS — N6489 Other specified disorders of breast: Secondary | ICD-10-CM | POA: Insufficient documentation

## 2022-03-29 DIAGNOSIS — D6481 Anemia due to antineoplastic chemotherapy: Secondary | ICD-10-CM | POA: Insufficient documentation

## 2022-03-29 DIAGNOSIS — Z87891 Personal history of nicotine dependence: Secondary | ICD-10-CM | POA: Insufficient documentation

## 2022-03-29 MED ORDER — PEGFILGRASTIM-CBQV 6 MG/0.6ML ~~LOC~~ SOSY
6.0000 mg | PREFILLED_SYRINGE | Freq: Once | SUBCUTANEOUS | Status: AC
  Administered 2022-03-29: 6 mg via SUBCUTANEOUS
  Filled 2022-03-29: qty 0.6

## 2022-03-29 NOTE — Patient Instructions (Signed)

## 2022-03-30 LAB — HIV-1 INTEGRASE GENOTYPE

## 2022-03-30 LAB — HIV RNA, RTPCR W/R GT (RTI, PI,INT)
HIV 1 RNA Quant: 2880 copies/mL — ABNORMAL HIGH
HIV-1 RNA Quant, Log: 3.46 Log copies/mL — ABNORMAL HIGH

## 2022-03-30 LAB — T-HELPER CELLS (CD4) COUNT (NOT AT ARMC)
Absolute CD4: 317 cells/uL — ABNORMAL LOW (ref 490–1740)
CD4 T Helper %: 21 % — ABNORMAL LOW (ref 30–61)
Total lymphocyte count: 1477 cells/uL (ref 850–3900)

## 2022-03-30 LAB — HIV-1 GENOTYPE: HIV-1 Genotype: DETECTED — AB

## 2022-04-01 ENCOUNTER — Ambulatory Visit: Payer: Medicare HMO | Admitting: Pharmacist

## 2022-04-01 ENCOUNTER — Other Ambulatory Visit: Payer: Self-pay

## 2022-04-01 ENCOUNTER — Other Ambulatory Visit (HOSPITAL_COMMUNITY): Payer: Self-pay

## 2022-04-01 ENCOUNTER — Ambulatory Visit (INDEPENDENT_AMBULATORY_CARE_PROVIDER_SITE_OTHER): Payer: Medicare HMO | Admitting: Internal Medicine

## 2022-04-01 ENCOUNTER — Telehealth: Payer: Self-pay | Admitting: Pharmacist

## 2022-04-01 DIAGNOSIS — Z79899 Other long term (current) drug therapy: Secondary | ICD-10-CM | POA: Diagnosis not present

## 2022-04-01 DIAGNOSIS — B20 Human immunodeficiency virus [HIV] disease: Secondary | ICD-10-CM | POA: Diagnosis not present

## 2022-04-01 NOTE — Telephone Encounter (Addendum)
Updated resistance chart after genotype resulted from 03/21/22:  Cumulative HIV Genotype Data   RT Mutations  K65R, S68SG, M184IV, K101E, E138K, Y181C, G190A  PI Mutations  I50L  Integrase Mutations  N155H, D232N, G140S, Q148H, G163R    Interpretation of Genotype Data per [employer] HIV Drug Resistance Database:   Nucleoside RTIs  Abacavir - high level resistance Zidovudine - susceptible Emtricitabine - high level resistance Lamivudine - high level resistance Tenofovir - intermediate resistance    Non-Nucleoside RTIs  Doravirine - intermediate resistance Efavirenz - high level resistance Etravirine - high level resistance Nevirapine - high level resistance Rilpivirine - high level resistance    Protease Inhibitors  Atazanavir - high level resistance Darunavir - susceptible Lopinavir - susceptible    Integrase Inhibitors  Bictegravir - high level resistance Cabotegravir - high level resistance Dolutegravir - high level resistance Elvitegravir - high level resistance Raltegravir - high level resistance   Veronica Robles was taking Evotaz daily, Zidovudine daily, and Tivicay BID. She now has high level resistance to Evotaz (atazanavir) and high level resistance to entire integrase class. Seeing patient today to discuss ART change with recent start of chemo.  Tiron Suski L. Eber Hong, PharmD, BCIDP, AAHIVP, CPP Clinical Pharmacist Practitioner [facility] 04/01/2022, 10:05 AM

## 2022-04-01 NOTE — Progress Notes (Signed)
Virtual Visit via Telephone Note  I connected with [name] on 04/01/22 at  1:45 PM EDT by telephone and verified that I am speaking with the correct person using two identifiers.  Location: Patient: at home Provider: in clinic   I discussed the limitations, risks, security and privacy concerns of performing an evaluation and management service by telephone and the availability of in person appointments. I also discussed with the patient that there may be a patient responsible charge related to this service. The patient expressed understanding and agreed to proceed.   History of Present Illness: Veronica Robles is a 66yo F with poorly controlled, MDR HIV disease. CD 4 count of [id number] on(aug 2023) currently on Evotaz daily, Zidovudine daily, and Tivicay BID. She was asked to stop taking ART on 8/31 due to starting chemo regimen for stage 2B breast ca. She had first cycle of dose dense Adriamycin and Cytoxan for which she tolerated however feeling light headed thus decided it was best not to drive and do her clinic visit as a phone visit. On her august labs, hiv genotype was done which now shows that She has high level resistance to Evotaz (atazanavir) and high level resistance to entire integrase class in addn to previously known resistance patterns. We discussed briefly that her hiv regimen is to change to minimize interactions with chemo as well as placing her on a regimen that has OBR. She has hx of having diffuse skin rash while on darunavir.   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  Neoadjuvant chemotherapy with dose dense Adriamycin and Cytoxan x4 followed by Taxol weekly x12 2. breast conserving surgery  with targeted node dissection 3.  Adjuvant radiation 4.  Follow-up antiestrogen therapy with abemaciclib   Observations/Objective:fluent speech, does not appear to be in distress  Recent labs:  Cumulative HIV Genotype Data   RT Mutations  K65R, S68SG, M184IV, K101E, E138K, Y181C, G190A  PI Mutations  I50L  Integrase Mutations  N155H, D232N, G140S, Q148H, G163R    Interpretation of Genotype Data per [employer] HIV Drug Resistance Database:   Nucleoside RTIs  Abacavir - high level resistance Zidovudine - susceptible Emtricitabine - high level resistance Lamivudine - high level resistance Tenofovir - intermediate resistance    Non-Nucleoside RTIs  Doravirine - intermediate resistance Efavirenz - high level resistance Etravirine - high level resistance Nevirapine - high level resistance Rilpivirine - high level resistance    Protease Inhibitors  Atazanavir - high level resistance Darunavir - susceptible Lopinavir - susceptible    Integrase Inhibitors  Bictegravir - high level resistance Cabotegravir - high level resistance Dolutegravir - high level resistance Elvitegravir - high level resistance Raltegravir - high level resistance     Assessment and Plan:  Will plan to switch her to lenacapavir plus rukobia plus descovy. Will discuss further if she would benefit from ibalizumab infusion Q2wk.  Follow Up Instructions: to see back on Wednesday for adherence counseling and education on new regimen with myself or cassie    I discussed the assessment and treatment plan with the patient. The patient was provided an opportunity to ask questions and all were answered. The patient agreed with the plan and demonstrated an understanding of the instructions.  The patient was advised to call back or seek an in-person evaluation if the symptoms worsen or if the condition fails to improve as anticipated.  I provided 15 minutes of non-face-to-face time during this  encounter.   Carlyle Basques, MD

## 2022-04-02 ENCOUNTER — Telehealth: Payer: Self-pay | Admitting: Hematology and Oncology

## 2022-04-02 ENCOUNTER — Encounter: Payer: Self-pay | Admitting: Hematology and Oncology

## 2022-04-02 ENCOUNTER — Ambulatory Visit
Admission: RE | Admit: 2022-04-02 | Discharge: 2022-04-02 | Disposition: A | Discharge status: Home / Self Care | Payer: Medicare HMO | Source: Ambulatory Visit | Attending: Hematology and Oncology | Admitting: Hematology and Oncology

## 2022-04-02 ENCOUNTER — Other Ambulatory Visit: Payer: Self-pay | Admitting: Pharmacist

## 2022-04-02 ENCOUNTER — Other Ambulatory Visit: Payer: Self-pay | Admitting: *Deleted

## 2022-04-02 ENCOUNTER — Telehealth: Payer: Self-pay

## 2022-04-02 ENCOUNTER — Other Ambulatory Visit (HOSPITAL_COMMUNITY): Payer: Self-pay

## 2022-04-02 DIAGNOSIS — B20 Human immunodeficiency virus [HIV] disease: Secondary | ICD-10-CM

## 2022-04-02 MED ORDER — GADOBUTROL 1 MMOL/ML IV SOLN
6.0000 mL | Freq: Once | INTRAVENOUS | Status: AC | PRN
  Administered 2022-04-02: 6 mL via INTRAVENOUS

## 2022-04-02 MED ORDER — SUNLENCA 463.5 MG/1.5ML ~~LOC~~ SOLN
927.0000 mg | SUBCUTANEOUS | 2 refills | Status: DC
  Filled 2022-04-02 – 2022-04-08 (×2): qty 3, 180d supply, fill #0
  Filled 2022-09-25: qty 3, 180d supply, fill #1
  Filled 2023-03-26: qty 3, 180d supply, fill #2

## 2022-04-02 MED ORDER — SUNLENCA 4 X 300 MG PO TBPK
2.0000 | ORAL_TABLET | ORAL | 0 refills | Status: DC
  Filled 2022-04-02: qty 4, fill #0
  Filled 2022-04-03: qty 4, 1d supply, fill #0
  Filled 2022-04-08: qty 4, 15d supply, fill #0

## 2022-04-02 NOTE — Progress Notes (Signed)
Patient Care Team: [name], MD as PCP - General (Family Medicine) Carlyle Basques, MD as PCP - Infectious Diseases (Infectious Diseases) Rockwell Germany, RN as Oncology Nurse Navigator Mauro Kaufmann, RN as Oncology Nurse Navigator  DIAGNOSIS:  Encounter Diagnosis  Name Primary?   Malignant neoplasm of upper-outer quadrant of left breast in female, estrogen receptor positive ([place])     SUMMARY OF ONCOLOGIC HISTORY: Oncology History  Malignant neoplasm of upper-outer quadrant of left breast in female, estrogen receptor positive ([place])  02/28/2022 Initial Diagnosis   Screening detected left breast mass at 3 o'clock position measuring 0.9 cm, 4 enlarged left axillary lymph nodes along with asymmetric ducts between mass and the nipple possibly DCIS; lymph node biopsy: Positive, breast biopsy: Grade 2 IDC ER 100%, PR 0%, HER2 negative, Ki-67 10%   03/10/2022 Cancer Staging   Staging form: Breast, AJCC 8th Edition - Clinical stage from 03/10/2022: Stage IIA (cT1b, cN1(f), cM0, G2, ER+, PR-, HER2-) - Signed by Hayden Pedro, PA-C on 03/10/2022 Stage prefix: Initial diagnosis Method of lymph node assessment: Core biopsy Histologic grading system: 3 grade system   03/27/2022 -  Chemotherapy   Patient is on Treatment Plan : BREAST ADJUVANT DOSE DENSE AC q14d / PACLitaxel q7d       CHIEF COMPLIANT: Follow-up cycle 1 day 8  INTERVAL HISTORY: Veronica Robles is a 66 y.o. female is here because of recent diagnosis of left breast cancer. She presents to the clinic today for a follow-up. She denies vomiting. She reports light headiness and urine frequently. She states that she lost her taste. Denies bone pain. She more nervous and having anxiety. She does eat healthy by eating fruits and vegetables.   ALLERGIES:  is allergic to penicillins.  MEDICATIONS:  Current Outpatient Medications  Medication Sig Dispense Refill   Blood Pressure Monitoring (ADULT BLOOD PRESSURE CUFF LG)  KIT       dexamethasone (DECADRON) 4 MG tablet Take 1 tablet by mouth the day after chemo and 1 tablet 2 days after chemo; take with food 8 tablet 0   hydrochlorothiazide (HYDRODIURIL) 25 MG tablet TAKE 1 TABLET(25 MG) BY MOUTH DAILY 90 tablet 3   lenacapavir (SUNLENCA) 300 mg tablet Take 2 tablets by mouth See admin instructions. This is the 2-day initiation kit: Take two 300-mg tablets on Day 1 & Day 2 with or without food. 4 each 0   lidocaine-prilocaine (EMLA) cream Apply to affected area once 30 g 3   ondansetron (ZOFRAN) 8 MG tablet Take 1 tablet (8 mg) by mouth every 8 hours as needed for nausea/vomiting. Start third day after doxorubicin/cyclophosphamide chemotherapy. 30 tablet 1   prochlorperazine (COMPAZINE) 10 MG tablet Take 1 tablet (10 mg total) by mouth every 6 (six) hours as needed for nausea or vomiting. 30 tablet 1   SQ injection lenacapavir (SUNLENCA) 463.5 MG/1.5ML SQ injection Inject 3 mLs (927 mg total) into the skin every 6 (six) months. Administer each injection subcutaneously at separate sites in the abdomen (more or equal to 2 inches from the navel). 3 mL 2   traMADol (ULTRAM) 50 MG tablet Take 1 tablet (50 mg total) by mouth every 6 (six) hours as needed for moderate pain or severe pain. 20 tablet 0   No current facility-administered medications for this visit.   Facility-Administered Medications Ordered in Other Visits  Medication Dose Route Frequency Provider Last Rate Last Admin   ciprofloxacin (CIPRO) IVPB 400 mg  400 mg Intravenous Q12H Kathie Rhodes,  MD   400 mg at 03/26/22 0728    PHYSICAL EXAMINATION: ECOG PERFORMANCE STATUS: 1 - Symptomatic but completely ambulatory  Vitals:   04/03/22 1241  BP: 133/76  Pulse: 96  Resp: 14  Temp: 97.9 F (36.6 C)  SpO2: 99%   Filed Weights   04/03/22 1241  Weight: 127 lb 14.4 oz (58 kg)     LABORATORY DATA:  I have reviewed the data as listed    Latest Ref Rng & Units 03/27/2022   10:53 AM 03/12/2022   12:15 PM  08/29/2021    2:08 PM  CMP  Glucose 70 - 99 mg/dL 125  100  95   BUN 8 - 23 mg/dL '14  11  11   ' Creatinine 0.44 - 1.00 mg/dL 0.89  0.97  1.02   Sodium 135 - 145 mmol/L 140  142  140   Potassium 3.5 - 5.1 mmol/L 3.6  3.8  4.0   Chloride 98 - 111 mmol/L 104  106  102   CO2 22 - 32 mmol/L '31  30  31   ' Calcium 8.9 - 10.3 mg/dL 9.9  9.8  9.7   Total Protein 6.5 - 8.1 g/dL 7.6  8.1    Total Bilirubin 0.3 - 1.2 mg/dL 0.4  0.7    Alkaline Phos 38 - 126 U/L 60  61    AST 15 - 41 U/L 19  25    ALT 0 - 44 U/L 13  17      Lab Results  Component Value Date   WBC 0.3 (LL) 04/03/2022   HGB 10.1 (L) 04/03/2022   HCT 29.5 (L) 04/03/2022   MCV 87.5 04/03/2022   PLT 72 (L) 04/03/2022   NEUTROABS PENDING 04/03/2022    ASSESSMENT & PLAN:  Malignant neoplasm of upper-outer quadrant of left breast in female, estrogen receptor positive ([place]) 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; lymph node biopsy: Positive, breast biopsy: Grade 2 IDC ER 100%, PR 0%, HER2 negative, Ki-67 10%   Treatment plan: 1.  Neoadjuvant chemotherapy with dose dense Adriamycin and Cytoxan x4 followed by Taxol weekly x12 2. breast conserving surgery with targeted node dissection 3.  Adjuvant radiation 4.  Follow-up antiestrogen therapy with abemaciclib --------------------------------------------------------------------------------------------------------------------------------- Current treatment: Cycle 1 day 8 dose dense Adriamycin and Cytoxan  Chemotoxicities: 1.  Neutropenia: ANC 0 today.  We will reduce the dosage of chemotherapy for the next cycle. 2. Fatigue 3.  Nausea: Fairly mild 4.  Chemo-induced anemia: Hemoglobin today is 10.1: Monitoring   Return to clinic in 1 week for cycle 2    No orders of the defined types were placed in this encounter.  The patient has  a good understanding of the overall plan. she agrees with it. she will call with any problems that may develop before the next visit here. Total time spent: 30 mins including face to face time and time spent for planning, charting and co-ordination of care   Harriette Ohara, MD 04/03/22    I Gardiner Coins am scribing for Dr. Lindi Adie  I have reviewed the above documentation for accuracy and completeness, and I agree with the above.

## 2022-04-02 NOTE — Telephone Encounter (Signed)
[facility] Patient Advocate Encounter   I was successful in securing patient a $ 10,000 grant from [employer] to provide copayment coverage for [place].  The patient's out of pocket cost will be 0.00 monthly.     I have spoken with the patient.    The billing information is as follows and has been shared with [facility].        Dates of Eligibility: 04/02/22 through 07/27/22  Patient knows to call the office with questions or concerns.  [name], [facility] Patient [facility] for Infectious Disease Phone: [phone] Fax:  [phone]

## 2022-04-02 NOTE — Telephone Encounter (Signed)
Scheduled appointment per the W[facility]. Left voicemail.

## 2022-04-02 NOTE — Progress Notes (Signed)
Called pt to introduce myself as her Financial Resource Specialist and to discuss the [employer] grant.  I left a msg requesting she return my call if she's interested in applying for the grant.  ?

## 2022-04-03 ENCOUNTER — Other Ambulatory Visit: Payer: Self-pay

## 2022-04-03 ENCOUNTER — Telehealth: Payer: Self-pay

## 2022-04-03 ENCOUNTER — Encounter: Payer: Self-pay | Admitting: *Deleted

## 2022-04-03 ENCOUNTER — Ambulatory Visit (INDEPENDENT_AMBULATORY_CARE_PROVIDER_SITE_OTHER): Payer: Medicare HMO | Admitting: Pharmacist

## 2022-04-03 ENCOUNTER — Encounter: Payer: Self-pay | Admitting: Internal Medicine

## 2022-04-03 ENCOUNTER — Inpatient Hospital Stay: Payer: Medicare HMO

## 2022-04-03 ENCOUNTER — Ambulatory Visit (INDEPENDENT_AMBULATORY_CARE_PROVIDER_SITE_OTHER): Payer: Medicare HMO | Admitting: Internal Medicine

## 2022-04-03 ENCOUNTER — Other Ambulatory Visit (HOSPITAL_COMMUNITY): Payer: Self-pay

## 2022-04-03 ENCOUNTER — Inpatient Hospital Stay (HOSPITAL_BASED_OUTPATIENT_CLINIC_OR_DEPARTMENT_OTHER): Payer: Medicare HMO | Admitting: Hematology and Oncology

## 2022-04-03 VITALS — BP 119/75 | HR 98 | Temp 98.2°F | Resp 16 | Ht 62.0 in | Wt 129.0 lb

## 2022-04-03 DIAGNOSIS — C50412 Malignant neoplasm of upper-outer quadrant of left female breast: Secondary | ICD-10-CM

## 2022-04-03 DIAGNOSIS — Z170 Estrogen receptor positive status [ER+]: Secondary | ICD-10-CM | POA: Diagnosis not present

## 2022-04-03 DIAGNOSIS — Z95828 Presence of other vascular implants and grafts: Secondary | ICD-10-CM

## 2022-04-03 DIAGNOSIS — Z789 Other specified health status: Secondary | ICD-10-CM | POA: Diagnosis not present

## 2022-04-03 DIAGNOSIS — Z5111 Encounter for antineoplastic chemotherapy: Secondary | ICD-10-CM | POA: Diagnosis not present

## 2022-04-03 DIAGNOSIS — B20 Human immunodeficiency virus [HIV] disease: Secondary | ICD-10-CM

## 2022-04-03 LAB — CMP (CANCER CENTER ONLY)
ALT: 15 U/L (ref 0–44)
AST: 15 U/L (ref 15–41)
Albumin: 3.6 g/dL (ref 3.5–5.0)
Alkaline Phosphatase: 62 U/L (ref 38–126)
Anion gap: 2 — ABNORMAL LOW (ref 5–15)
BUN: 14 mg/dL (ref 8–23)
CO2: 33 mmol/L — ABNORMAL HIGH (ref 22–32)
Calcium: 8.8 mg/dL — ABNORMAL LOW (ref 8.9–10.3)
Chloride: 98 mmol/L (ref 98–111)
Creatinine: 0.75 mg/dL (ref 0.44–1.00)
GFR, Estimated: >60 mL/min (ref >60)
Glucose, Bld: 97 mg/dL (ref 70–99)
Potassium: 3.6 mmol/L (ref 3.5–5.1)
Sodium: 133 mmol/L — ABNORMAL LOW (ref 135–145)
Total Bilirubin: 0.8 mg/dL (ref 0.3–1.2)
Total Protein: 6.4 g/dL — ABNORMAL LOW (ref 6.5–8.1)

## 2022-04-03 LAB — CBC WITH DIFFERENTIAL (CANCER CENTER ONLY)
Abs Immature Granulocytes: 0 10*3/uL (ref 0.00–0.07)
Basophils Absolute: 0 10*3/uL (ref 0.0–0.1)
Basophils Relative: 3 %
Eosinophils Absolute: 0 10*3/uL (ref 0.0–0.5)
Eosinophils Relative: 7 %
HCT: 29.5 % — ABNORMAL LOW (ref 36.0–46.0)
Hemoglobin: 10.1 g/dL — ABNORMAL LOW (ref 12.0–15.0)
Immature Granulocytes: 0 %
Lymphocytes Relative: 83 %
Lymphs Abs: 0.3 10*3/uL — ABNORMAL LOW (ref 0.7–4.0)
MCH: 30 pg (ref 26.0–34.0)
MCHC: 34.2 g/dL (ref 30.0–36.0)
MCV: 87.5 fL (ref 80.0–100.0)
Monocytes Absolute: 0 10*3/uL — ABNORMAL LOW (ref 0.1–1.0)
Monocytes Relative: 7 %
Neutro Abs: 0 10*3/uL — CL (ref 1.7–7.7)
Neutrophils Relative %: 0 %
Platelet Count: 72 10*3/uL — ABNORMAL LOW (ref 150–400)
RBC: 3.37 MIL/uL — ABNORMAL LOW (ref 3.87–5.11)
RDW: 13.3 % (ref 11.5–15.5)
Smear Review: NORMAL
WBC Count: 0.3 10*3/uL — CL (ref 4.0–10.5)
nRBC: 0 % (ref 0.0–0.2)

## 2022-04-03 MED ORDER — SODIUM CHLORIDE 0.9% FLUSH
10.0000 mL | INTRAVENOUS | Status: AC | PRN
  Administered 2022-04-03: 10 mL

## 2022-04-03 MED ORDER — HEPARIN SOD (PORK) LOCK FLUSH 100 UNIT/ML IV SOLN: 500.0000 [IU] | INTRAVENOUS | Status: DC | PRN

## 2022-04-03 NOTE — Progress Notes (Signed)
04/03/2022  HPI: Veronica Robles is a 66 y.o. female who presents to the [facility] for HIV follow-up.  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   HYPERTENSION, BENIGN 01/26/2007   SHINGLES, RECURRENT 12/08/2006   HIV disease ([place]) 10/26/2006   HX, PERSONAL, PENICILLIN ALLERGY 10/26/2006   HX, PERSONAL, DRUG ALLERGY NOS 10/26/2006    Patient's Medications  New Prescriptions   No medications on file  Previous Medications   BLOOD PRESSURE MONITORING (ADULT BLOOD PRESSURE CUFF LG) KIT        DEXAMETHASONE (DECADRON) 4 MG TABLET    Take 1 tablet by mouth the day after chemo and 1 tablet 2 days after chemo; take with food   HYDROCHLOROTHIAZIDE (HYDRODIURIL) 25 MG TABLET    TAKE 1 TABLET(25 MG) BY MOUTH DAILY   LENACAPAVIR (SUNLENCA) 300 MG TABLET    Take 2 tablets by mouth See admin instructions. This is the 2-day initiation kit: Take two 300-mg tablets on Day 1 & Day 2 with or without food.   LIDOCAINE-PRILOCAINE (EMLA) CREAM    Apply to affected area once   ONDANSETRON (ZOFRAN) 8 MG TABLET    Take 1 tablet (8 mg) by mouth every 8 hours as needed for nausea/vomiting. Start third day after doxorubicin/cyclophosphamide chemotherapy.   PROCHLORPERAZINE (COMPAZINE) 10 MG TABLET    Take 1 tablet (10 mg total) by mouth every 6 (six) hours as needed for nausea or vomiting.   SQ INJECTION LENACAPAVIR (SUNLENCA) 463.5 MG/1.5ML SQ INJECTION    Inject 3 mLs (927 mg total) into the skin every 6 (six) months. Administer each injection subcutaneously at separate sites in the abdomen (more or equal to 2 inches from  the navel).   TRAMADOL (ULTRAM) 50 MG TABLET    Take 1 tablet (50 mg total) by mouth every 6 (six) hours as needed for moderate pain or severe pain.  Modified Medications   No medications on file  Discontinued Medications   No medications on file    Allergies: Allergies  Allergen Reactions   Penicillins Rash    Has patient had a PCN reaction causing immediate rash, facial/tongue/throat swelling, SOB or lightheadedness with hypotension: No Has patient had a PCN reaction causing severe rash involving mucus membranes or skin necrosis: No Has patient had a PCN reaction that required hospitalization No Has patient had a PCN reaction occurring within the last 10 years: No If all of the above answers are "NO", then may proceed with Cephalosporin use.     Past Medical History: Past Medical History:  Diagnosis Date   Breast cancer ([place]) 02/2022   left breast IDC with DCIS   Family history of breast cancer 03/20/2022   Family history of pancreatic cancer 03/20/2022   HIV infection ([place]) 1993   Hypertension     Social History: Social History   Socioeconomic History   Marital status: Single    Spouse name: Not on file   Number of children: Not on file   Years of education: Not on file   Highest education level: Not on file  Occupational History   Not on file  Tobacco Use  Smoking status: Former    Packs/day: 0.10    Types: Cigarettes   Smokeless tobacco: Never  Vaping Use   Vaping Use: Never used  Substance and Sexual Activity   Alcohol use: No    Alcohol/week: 0.0 standard drinks of alcohol   Drug use: No   Sexual activity: Not Currently    Partners: Male    Birth control/protection: Surgical    Comment: declined condoms  Other Topics Concern   Not on file  Social History Narrative   Not on file   Social Determinants of Health   Financial Resource Strain: Not on file  Food Insecurity: Not on file  Transportation Needs: Not on file  Physical Activity: Not on file   Stress: Not on file  Social Connections: Not on file    Labs: Lab Results  Component Value Date   HIV1RNAQUANT 2,880 (H) 03/21/2022   HIV1RNAQUANT 16,100 (H) 09/17/2021   HIV1RNAQUANT 317 (H) 06/13/2021   CD4TABS 302 (L) 06/13/2021   CD4TABS 278 (L) 11/01/2020   CD4TABS 360 (L) 05/01/2020    RPR and STI Lab Results  Component Value Date   LABRPR NON-REACTIVE 05/01/2020   LABRPR NON-REACTIVE 05/10/2018   LABRPR NON REAC 10/30/2015   LABRPR NON REAC 05/24/2015   LABRPR NON REAC 12/05/2013    STI Results GC CT  05/01/2020  2:25 PM Negative  Negative   03/04/2016 12:00 AM Negative  Negative   12/05/2013 12:00 AM  CT: Negative     Hepatitis B Lab Results  Component Value Date   HEPBSAB INDETER (A) 08/08/2009   HEPBSAG NEG 08/08/2009   HEPBCAB POS (A) 08/08/2009   Hepatitis C No results found for: "HEPCAB", "HCVRNAPCRQN" Hepatitis A Lab Results  Component Value Date   HAV POS (A) 08/08/2009   Lipids: Lab Results  Component Value Date   CHOL 203 (H) 01/04/2020   TRIG 133 01/04/2020   HDL 51 01/04/2020   CHOLHDL 4.0 01/04/2020   VLDL 37 (H) 10/30/2015   LDLCALC 127 (H) 01/04/2020    Current HIV Regimen: Evotaz daily + Retrovir daily + Tivicay BID  Cumulative HIV Genotype Data   RT Mutations  K65R, S68SG, M184IV, K101E, E138K, Y181C, G190A  PI Mutations  I50L  Integrase Mutations  N155H, D232N, G140S, Q148H, G163R    Interpretation of Genotype Data per [employer] HIV Drug Resistance Database:   Nucleoside RTIs  Abacavir - high level resistance Zidovudine - susceptible Emtricitabine - high level resistance Lamivudine - high level resistance Tenofovir - intermediate resistance    Non-Nucleoside RTIs  Doravirine - intermediate resistance Efavirenz - high level resistance Etravirine - high level resistance Nevirapine - high level resistance Rilpivirine - high level resistance    Protease Inhibitors  Atazanavir - high level resistance Darunavir -  susceptible Lopinavir - susceptible    Integrase Inhibitors  Bictegravir - high level resistance Cabotegravir - high level resistance Dolutegravir - high level resistance Elvitegravir - high level resistance Raltegravir - high level resistance     Assessment: Oval is here today for counseling on her new HIV regimen. She has MDR HIV with limited treatment options. She was also recently diagnosed with breast cancer and just started her chemotherapy regimen of Adriamycin, Cytoxan, and Taxol. Her chemotherapy regimen does interact with her current regimen of Evotaz, Retrovir, and twice daily Tivicay - Evotaz is contraindicated with Taxol, and Zidovudine is contraindicated with Cytoxan and Taxol.   We were planning on continuing to use Tivicay BID but her recent genotype  from 8/25 showed even more MDR virus with high level resistance to Tivicay (was previously low level resistance). Evotaz also had high level resistance (previously susceptible). Simrat did admit to skipping doses and only taking it a few times per week. She has historically struggled with adherence for years.   Very difficult situation. Dr. Baxter Flattery, Dr. Tommy Medal, and myself spoke with several ID physicians and consultants from around the country (Dr. Lubertha Basque, Dr. Francine Graven, [facility] consultants) and think the best option for her is Rukobia BID, Descovy daily, and SQ Sunlenca q78month. There has been data showing activity with tenofovir and other NRTIs despite M184V and K65R mutations. Intravenous Trogarzo every 2 weeks is also a possibility but we will save it for the future, if needed.   Discussed this plan extensively with SEnid Derry She is quite overwhelmed, rightfully so. She has had several deaths in her family this year and is helping care for her 854year old sister as well. Her son accompanies her to her oncology appointments, but she has not shared her HIV diagnosis with him. She has been diagnosed for 30 years. She does say that  she is thinking about telling him soon.   She has SPAP which will help with her Descovy and [place]copays. Sunlenca will need PAF to help with copays. If they will not help, then GPhilis Fendthas agreed to approve her through patient assistance. SShatiamust provide u[place]ore[place]ith proof of income in order to get approved for any assistance, so she will bring that by on Monday 9/11.   She asked that I try and help her stay motivated and encourage her. I would like to see her every 2-3 weeks to make sure she stays on track. She does have a pillbox and I offered to fill this for her monthly. We will not start any medications yet until [facility]. Once we do, I will have it in clinic to administer to her. I will also try to have her other medications here as well so I can fill her pillbox or show her how to take them. There is a drug interaction with her Adriamycin and Sunlenca. And also with Decadron and Sunlenca. Her oncology team is aware and her oncologist will dose adjust her chemotherapy, if needed. I explained how Sunlenca is administered as she thought she could get it through her port. Advised that it is two subcutaneous injections in her abdomen twice per year.   Will continue to follow and make [facility]. Will do more counseling once we get all of her HIV medications squared away.  I will call her once we get her proof of income and get everything set up. She is to reach out with any questions in the meantime.   Plan: - Stop all HIV medications (Evotaz, Retrovir, and Tivicay) - Seek copay assistance and approval for Sunlenca - [facility] - F/u with patient once all medications are approved  Finola Rosal L. Jamarkus Lisbon, PharmD, BCIDP, AAHIVP, CPP Clinical Pharmacist Practitioner Infectious Diseases [facility] 04/03/2022, 4:05 PM

## 2022-04-03 NOTE — Assessment & Plan Note (Addendum)
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; lymph node biopsy: Positive, breast biopsy: Grade 2 IDC ER 100%, PR 0%, HER2 negative, Ki-67 10%  Treatment plan: 1.Neoadjuvant chemotherapy with dose dense Adriamycin and Cytoxan x4 followed by Taxol weekly x12 2.breast conserving surgery with targeted node dissection 3.Adjuvant radiation 4.Follow-up antiestrogen therapy with abemaciclib --------------------------------------------------------------------------------------------------------------------------------- Current treatment: Cycle 1 day 8 dose dense Adriamycin and Cytoxan  Chemotoxicities: 1.  Neutropenia: ANC 0 today.  We will reduce the dosage of chemotherapy for the next cycle. 2. Fatigue 3.  Nausea: Fairly mild  Return to clinic in 1 week for cycle 2

## 2022-04-03 NOTE — Telephone Encounter (Signed)
Veronica Robles, Counseling Intern, phoned Veronica Robles for our scheduled phone call. Veronica Robles was about to leave for an appointment.   Veronica Robles rescheduled for Monday, September 11th at [place]  Counseling Intern

## 2022-04-03 NOTE — Progress Notes (Signed)
CRITICAL VALUE STICKER  CRITICAL VALUE: ANC 0.0 and WBC 0.3  RECEIVER (on-site recipient of call): Merleen Nicely, [place] NOTIFIED: 04/03/22 at 1250  MESSENGER (representative from lab): Pam  MD NOTIFIED: Nicholas Lose, MD  TIME OF NOTIFICATION: 04/03/22 at 1250  RESPONSE: MD notified and verbalized understanding. No orders received at this time.

## 2022-04-04 ENCOUNTER — Encounter: Payer: Self-pay | Admitting: Hematology and Oncology

## 2022-04-06 ENCOUNTER — Other Ambulatory Visit: Payer: Self-pay | Admitting: Nurse Practitioner

## 2022-04-06 DIAGNOSIS — I10 Essential (primary) hypertension: Secondary | ICD-10-CM

## 2022-04-06 NOTE — Progress Notes (Signed)
RFV: follow up for hiv disease  Patient ID: Veronica Robles, female   DOB: Oct 16, 1955, 66 y.o.   MRN: [id number]  HPI 66yo F with poor adherence, MDR HIV disease, CD 4 count of 317/VL 2880, previously on Evotaz daily, Zidovudine daily, and Tivicay BID, where she admits not taking her medications roughly 2 doses per week. She has subsequently gained numerous mutations to intergrase class, as well as R to atazanavir. She was asked to stop taking ART on 8/31 due to starting chemo regimen for stage 2B breast ca. She had first cycle of dose dense Adriamycin and Cytoxan  She is here to start new hiv regimen as well as adherence counseling  Outpatient Encounter Medications as of 04/03/2022  Medication Sig   Blood Pressure Monitoring (ADULT BLOOD PRESSURE CUFF LG) KIT     dexamethasone (DECADRON) 4 MG tablet Take 1 tablet by mouth the day after chemo and 1 tablet 2 days after chemo; take with food   hydrochlorothiazide (HYDRODIURIL) 25 MG tablet TAKE 1 TABLET(25 MG) BY MOUTH DAILY   lidocaine-prilocaine (EMLA) cream Apply to affected area once   ondansetron (ZOFRAN) 8 MG tablet Take 1 tablet (8 mg) by mouth every 8 hours as needed for nausea/vomiting. Start third day after doxorubicin/cyclophosphamide chemotherapy.   prochlorperazine (COMPAZINE) 10 MG tablet Take 1 tablet (10 mg total) by mouth every 6 (six) hours as needed for nausea or vomiting.   traMADol (ULTRAM) 50 MG tablet Take 1 tablet (50 mg total) by mouth every 6 (six) hours as needed for moderate pain or severe pain.   lenacapavir (SUNLENCA) 300 mg tablet Take 2 tablets by mouth See admin instructions. This is the 2-day initiation kit: Take two 300-mg tablets on Day 1 & Day 2 with or without food.   SQ injection lenacapavir (SUNLENCA) 463.5 MG/1.5ML SQ injection Inject 3 mLs (927 mg total) into the skin every 6 (six) months. Administer each injection subcutaneously at separate sites in the abdomen (more or equal to 2 inches from the navel).    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  HENT: Negative for congestion, sore throat, rhinorrhea, sneezing, trouble swallowing and sinus pressure.  Eyes: Negative for photophobia and visual disturbance.  Respiratory: Negative for cough, chest tightness, shortness of breath, wheezing and stridor.  Cardiovascular: Negative for chest pain, palpitations and leg swelling.  Gastrointestinal: Negative for nausea, vomiting, abdominal pain, diarrhea, constipation, blood  in stool, abdominal distention and anal bleeding.  Genitourinary: Negative for dysuria, hematuria, flank pain and difficulty urinating.  Musculoskeletal: Negative for myalgias, back pain, joint swelling, arthralgias and gait problem.  Skin: Negative for color change, pallor, rash and wound.  Neurological: Negative for dizziness, tremors, weakness  and light-headedness.  Hematological: Negative for adenopathy. Does not bruise/bleed easily.  Psychiatric/Behavioral: Negative for behavioral problems, confusion, sleep disturbance, dysphoric mood, decreased concentration and agitation.   Physical Exam   BP 119/75   Pulse 98   Temp 98.2 F (36.8 C) (Oral)   Resp 16   Ht '5\' 2"'  (1.575 m)   Wt 129 lb (58.5 kg)   SpO2 100%   BMI 23.59 kg/m   Physical Exam  Constitutional: He is oriented to person, place, and time. He appears well-developed and well-nourished. No distress.  HENT:  Mouth/Throat: Oropharynx is clear and moist. No oropharyngeal exudate.  Cardiovascular: Normal rate, regular rhythm and normal heart sounds. Exam reveals no gallop and no friction rub.  No murmur heard.  Chest wall - port in place Pulmonary/Chest: Effort normal and breath sounds normal. No respiratory distress. He has no wheezes.  Abdominal: Soft. Bowel sounds are normal. He exhibits no distension. There is no tenderness.  Lymphadenopathy:  He has no cervical adenopathy.  Neurological: He is alert and oriented to person, place, and time.  Skin: Skin is warm and dry. No rash noted. No erythema.  Psychiatric: He has a normal mood and affect. His behavior is normal.   Lab Results  Component Value Date   CD4TCELL 21 (L) 03/21/2022   Lab Results  Component Value Date   CD4TABS 302 (L) 06/13/2021   CD4TABS 278 (L) 11/01/2020   CD4TABS 360 (L) 05/01/2020   Lab Results  Component Value Date   HIV1RNAQUANT 2,880 (H) 03/21/2022   Lab Results  Component Value Date   HEPBSAB INDETER (A) 08/08/2009   Lab Results  Component Value Date   LABRPR NON-REACTIVE 05/01/2020    CBC Lab Results  Component Value Date   WBC 0.3 (LL) 04/03/2022   RBC 3.37 (L) 04/03/2022   HGB 10.1 (L) 04/03/2022   HCT 29.5 (L) 04/03/2022   PLT 72 (L) 04/03/2022   MCV 87.5 04/03/2022   MCH 30.0 04/03/2022   MCHC 34.2 04/03/2022   RDW 13.3 04/03/2022   LYMPHSABS 0.3 (L)  04/03/2022   MONOABS 0.0 (L) 04/03/2022   EOSABS 0.0 04/03/2022    BMET Lab Results  Component Value Date   NA 133 (L) 04/03/2022   K 3.6 04/03/2022   CL 98 04/03/2022   CO2 33 (H) 04/03/2022   GLUCOSE 97 04/03/2022   BUN 14 04/03/2022   CREATININE 0.75 04/03/2022   CALCIUM 8.8 (L) 04/03/2022   GFRNONAA >60 04/03/2022   GFRAA 73 11/01/2020    HIV resistance:    RT Mutations  K65R, S68SG, M184IV, K101E, E138K, Y181C, G190A  PI Mutations  I50L  Integrase Mutations  N155H, D232N, G140S, Q148H, G163R    Interpretation of Genotype Data per [employer] HIV Drug Resistance Database:   Nucleoside RTIs  Abacavir - high level resistance Zidovudine - susceptible Emtricitabine - high level resistance Lamivudine - high level resistance Tenofovir - intermediate resistance    Non-Nucleoside RTIs  Doravirine - intermediate resistance Efavirenz - high level resistance Etravirine - high level resistance Nevirapine - high level resistance Rilpivirine - high level resistance    Protease Inhibitors  Atazanavir - high level resistance Darunavir - susceptible Lopinavir - susceptible  Integrase Inhibitors  Bictegravir - high level resistance Cabotegravir - high level resistance Dolutegravir - high level resistance Elvitegravir - high level resistance Raltegravir - high level resistance     Assessment and Plan - we solicited advice from various national HIV experts- recommendations include:   Rukobia BID, Descovy daily, and SQ Sunlenca q31month. Will start getting PA from pharmacy and patient assistance to start all 3 and will have close follow up for patient  Time spent with patient 50 min inc none face to face time.

## 2022-04-07 ENCOUNTER — Telehealth: Payer: Self-pay

## 2022-04-07 NOTE — Telephone Encounter (Signed)
Veronica Robles, counseling intern, called patient to check in. Patient asked counselor to call back later this afternoon or on Thursday.   Veronica Robles  Counseling Intern

## 2022-04-07 NOTE — Progress Notes (Signed)
Veronica Robles, counseling intern, spoke to patient on phone. Patient reported feeling supported by family and friends. Patient has my number and will call me if they need extra emotional support.   Veronica Robles  Counseling Intern

## 2022-04-08 ENCOUNTER — Encounter: Payer: Self-pay | Admitting: Hematology and Oncology

## 2022-04-08 ENCOUNTER — Telehealth: Payer: Self-pay | Admitting: Pharmacist

## 2022-04-08 ENCOUNTER — Other Ambulatory Visit: Payer: Self-pay | Admitting: Pharmacist

## 2022-04-08 ENCOUNTER — Other Ambulatory Visit (HOSPITAL_COMMUNITY): Payer: Self-pay

## 2022-04-08 ENCOUNTER — Encounter: Payer: Self-pay | Admitting: *Deleted

## 2022-04-08 ENCOUNTER — Telehealth: Payer: Self-pay

## 2022-04-08 DIAGNOSIS — B20 Human immunodeficiency virus [HIV] disease: Secondary | ICD-10-CM

## 2022-04-08 MED ORDER — RUKOBIA 600 MG PO TB12
600.0000 mg | ORAL_TABLET | Freq: Two times a day (BID) | ORAL | 5 refills | Status: DC
  Filled 2022-04-08 (×2): qty 60, 30d supply, fill #0
  Filled 2022-05-09: qty 60, 30d supply, fill #1

## 2022-04-08 MED ORDER — DESCOVY 200-25 MG PO TABS
1.0000 | ORAL_TABLET | Freq: Every day | ORAL | 5 refills | Status: DC
  Filled 2022-04-08 (×2): qty 30, 30d supply, fill #0
  Filled 2022-04-29 – 2022-05-09 (×2): qty 30, 30d supply, fill #1

## 2022-04-08 MED FILL — Dexamethasone Sodium Phosphate Inj 100 MG/10ML: INTRAMUSCULAR | Qty: 1 | Status: AC

## 2022-04-08 MED FILL — Fosaprepitant Dimeglumine For IV Infusion 150 MG (Base Eq): INTRAVENOUS | Qty: 5 | Status: AC

## 2022-04-08 NOTE — Telephone Encounter (Signed)
[facility] Patient Advocate Encounter   I was successful in securing patient a $7500.00 grant from [employer] [place] (PAF) to provide copayment coverage for [facility].  This will make the out of pocket cost $0.00.     I have spoken with the patient.    The billing information is as follows and has been shared with [place].        Patient knows to call the office with questions or concerns.  [name], [facility] Patient [facility] for Infectious Disease Phone: [phone] Fax:  [phone]

## 2022-04-08 NOTE — Telephone Encounter (Signed)
Veronica Robles was able to get [facility] approved and copay assistance approved through PAF. Descovy, Burnadette Peter were all sent to [place] at [facility] and will be couriered here later this week. Called Veronica Robles to let her know. She will come in Tuesday 9/18 at 230pm to restart her ART, meet with me for counseling, and receive her first Sunlenca injections.   Veronica Robles L. Eloise Mula, PharmD, BCIDP, AAHIVP, CPP Clinical Pharmacist Practitioner Infectious [place] for Infectious Disease 04/08/2022, 2:28 PM

## 2022-04-09 ENCOUNTER — Inpatient Hospital Stay: Payer: Medicare HMO

## 2022-04-09 ENCOUNTER — Inpatient Hospital Stay (HOSPITAL_BASED_OUTPATIENT_CLINIC_OR_DEPARTMENT_OTHER): Payer: Medicare HMO | Admitting: Adult Health

## 2022-04-09 ENCOUNTER — Encounter: Payer: Self-pay | Admitting: Adult Health

## 2022-04-09 ENCOUNTER — Other Ambulatory Visit (HOSPITAL_COMMUNITY): Payer: Self-pay

## 2022-04-09 ENCOUNTER — Other Ambulatory Visit: Payer: Self-pay

## 2022-04-09 DIAGNOSIS — Z170 Estrogen receptor positive status [ER+]: Secondary | ICD-10-CM

## 2022-04-09 DIAGNOSIS — C50412 Malignant neoplasm of upper-outer quadrant of left female breast: Secondary | ICD-10-CM

## 2022-04-09 DIAGNOSIS — Z5111 Encounter for antineoplastic chemotherapy: Secondary | ICD-10-CM | POA: Diagnosis not present

## 2022-04-09 LAB — CMP (CANCER CENTER ONLY)
ALT: 16 U/L (ref 0–44)
AST: 19 U/L (ref 15–41)
Albumin: 3.8 g/dL (ref 3.5–5.0)
Alkaline Phosphatase: 87 U/L (ref 38–126)
Anion gap: 5 (ref 5–15)
BUN: 5 mg/dL — ABNORMAL LOW (ref 8–23)
CO2: 30 mmol/L (ref 22–32)
Calcium: 9.6 mg/dL (ref 8.9–10.3)
Chloride: 103 mmol/L (ref 98–111)
Creatinine: 0.86 mg/dL (ref 0.44–1.00)
GFR, Estimated: >60 mL/min (ref >60)
Glucose, Bld: 154 mg/dL — ABNORMAL HIGH (ref 70–99)
Potassium: 3.7 mmol/L (ref 3.5–5.1)
Sodium: 138 mmol/L (ref 135–145)
Total Bilirubin: 0.2 mg/dL — ABNORMAL LOW (ref 0.3–1.2)
Total Protein: 6.4 g/dL — ABNORMAL LOW (ref 6.5–8.1)

## 2022-04-09 LAB — CBC WITH DIFFERENTIAL (CANCER CENTER ONLY)
Abs Immature Granulocytes: 2.19 10*3/uL — ABNORMAL HIGH (ref 0.00–0.07)
Basophils Absolute: 0.1 10*3/uL (ref 0.0–0.1)
Basophils Relative: 1 %
Eosinophils Absolute: 0 10*3/uL (ref 0.0–0.5)
Eosinophils Relative: 0 %
HCT: 30.3 % — ABNORMAL LOW (ref 36.0–46.0)
Hemoglobin: 9.9 g/dL — ABNORMAL LOW (ref 12.0–15.0)
Immature Granulocytes: 13 %
Lymphocytes Relative: 5 %
Lymphs Abs: 0.8 10*3/uL (ref 0.7–4.0)
MCH: 29.7 pg (ref 26.0–34.0)
MCHC: 32.7 g/dL (ref 30.0–36.0)
MCV: 91 fL (ref 80.0–100.0)
Monocytes Absolute: 2.1 10*3/uL — ABNORMAL HIGH (ref 0.1–1.0)
Monocytes Relative: 13 %
Neutro Abs: 11.3 10*3/uL — ABNORMAL HIGH (ref 1.7–7.7)
Neutrophils Relative %: 68 %
Platelet Count: 161 10*3/uL (ref 150–400)
RBC: 3.33 MIL/uL — ABNORMAL LOW (ref 3.87–5.11)
RDW: 14 % (ref 11.5–15.5)
Smear Review: NORMAL
WBC Count: 16.4 10*3/uL — ABNORMAL HIGH (ref 4.0–10.5)
nRBC: 0 % (ref 0.0–0.2)

## 2022-04-09 MED ORDER — SODIUM CHLORIDE 0.9 % IV SOLN: Freq: Once | INTRAVENOUS | Status: AC

## 2022-04-09 MED ORDER — SODIUM CHLORIDE 0.9% FLUSH
10.0000 mL | INTRAVENOUS | Status: DC | PRN
  Administered 2022-04-09: 10 mL

## 2022-04-09 MED ORDER — SODIUM CHLORIDE 0.9 % IV SOLN
150.0000 mg | Freq: Once | INTRAVENOUS | Status: AC
  Administered 2022-04-09: 150 mg via INTRAVENOUS
  Filled 2022-04-09: qty 150

## 2022-04-09 MED ORDER — PALONOSETRON HCL INJECTION 0.25 MG/5ML
0.2500 mg | Freq: Once | INTRAVENOUS | Status: AC
  Administered 2022-04-09: 0.25 mg via INTRAVENOUS
  Filled 2022-04-09: qty 5

## 2022-04-09 MED ORDER — SODIUM CHLORIDE 0.9 % IV SOLN
10.0000 mg | Freq: Once | INTRAVENOUS | Status: AC
  Administered 2022-04-09: 10 mg via INTRAVENOUS
  Filled 2022-04-09: qty 10

## 2022-04-09 MED ORDER — SODIUM CHLORIDE 0.9 % IV SOLN
400.0000 mg/m2 | Freq: Once | INTRAVENOUS | Status: AC
  Administered 2022-04-09: 640 mg via INTRAVENOUS
  Filled 2022-04-09: qty 32

## 2022-04-09 MED ORDER — DOXORUBICIN HCL CHEMO IV INJECTION 2 MG/ML
40.0000 mg/m2 | Freq: Once | INTRAVENOUS | Status: AC
  Administered 2022-04-09: 64 mg via INTRAVENOUS
  Filled 2022-04-09: qty 32

## 2022-04-09 MED ORDER — HEPARIN SOD (PORK) LOCK FLUSH 100 UNIT/ML IV SOLN
500.0000 [IU] | Freq: Once | INTRAVENOUS | Status: AC | PRN
  Administered 2022-04-09: 500 [IU]

## 2022-04-09 NOTE — Progress Notes (Signed)
Center Cancer Follow up:    Veronica Mai, MD [phone] [place] [address]   DIAGNOSIS:  Cancer Staging  Malignant neoplasm of upper-outer quadrant of left breast in female, estrogen receptor positive ([place]) Staging form: Breast, AJCC 8th Edition - Clinical stage from 03/10/2022: Stage IIA (cT1b, cN1(f), cM0, G2, ER+, PR-, HER2-) - Signed by Hayden Pedro, PA-C on 03/10/2022 Stage prefix: Initial diagnosis Method of lymph node assessment: Core biopsy Histologic grading system: 3 grade system   SUMMARY OF ONCOLOGIC HISTORY: Oncology History  Malignant neoplasm of upper-outer quadrant of left breast in female, estrogen receptor positive ([place])  02/28/2022 Initial Diagnosis   Screening detected left breast mass at 3 o'clock position measuring 0.9 cm, 4 enlarged left axillary lymph nodes along with asymmetric ducts between mass and the nipple possibly DCIS; lymph node biopsy: Positive, breast biopsy: Grade 2 IDC ER 100%, PR 0%, HER2 negative, Ki-67 10%   03/10/2022 Cancer Staging   Staging form: Breast, AJCC 8th Edition - Clinical stage from 03/10/2022: Stage IIA (cT1b, cN1(f), cM0, G2, ER+, PR-, HER2-) - Signed by Hayden Pedro, PA-C on 03/10/2022 Stage prefix: Initial diagnosis Method of lymph node assessment: Core biopsy Histologic grading system: 3 grade system   03/27/2022 -  Chemotherapy   Patient is on Treatment Plan : BREAST ADJUVANT DOSE DENSE AC q14d / PACLitaxel q7d       CURRENT THERAPY: Adriamycin/Cytoxan Cycle 2  INTERVAL HISTORY: Veronica Robles 66 y.o. female returns for f/u and evaluation prior to receiving her second cycle of treatment with adriamycin and cytoxan.  She tolerated her first cycle moderately well, and has used zofran to help relieve her nausea.  She also has decreased appetite due to taste changes and would like some suggestions on what to do.     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  MEDICAL HISTORY: Past Medical History:  Diagnosis Date   Breast cancer ([place]) 02/2022   left breast IDC with DCIS   Family history of breast cancer 03/20/2022   Family history of pancreatic cancer 03/20/2022   HIV infection ([place]) 1993   Hypertension     SURGICAL HISTORY: Past Surgical History:  Procedure Laterality Date   ABDOMINAL HYSTERECTOMY     APPENDECTOMY     BREAST BIOPSY Right 2015   CYSTOSCOPY/RETROGRADE/URETEROSCOPY Right 11/14/2016   Procedure: CYSTOSCOPY/RETROGRADE/URETEROSCOPY/ STONE EXTRACTION/HOLMIUM LASER/STENT PLACEMENT;  Surgeon: Kathie Rhodes, MD;  Location: [facility];  Service: Urology;  Laterality: Right;   PORTACATH PLACEMENT Right 03/26/2022   Procedure: INSERTION PORT-A-CATH;  Surgeon: [name], MD;  Location: [place];  Service: General;  Laterality: Right;    SOCIAL HISTORY: Social History   Socioeconomic History   Marital status: Single    Spouse name: Not on file   Number of children: Not on file   Years of education: Not on file   Highest education level:  Not on file  Occupational History   Not on file  Tobacco Use   Smoking status: Former    Packs/day: 0.10    Types: Cigarettes   Smokeless tobacco: Never  Vaping Use   Vaping Use: Never used  Substance and Sexual Activity   Alcohol use: No    Alcohol/week: 0.0 standard drinks of alcohol    Drug use: No   Sexual activity: Not Currently    Partners: Male    Birth control/protection: Surgical    Comment: declined condoms  Other Topics Concern   Not on file  Social History Narrative   Not on file   Social Determinants of Health   Financial Resource Strain: Not on file  Food Insecurity: Not on file  Transportation Needs: Not on file  Physical Activity: Not on file  Stress: Not on file  Social Connections: Not on file  Intimate Partner Violence: Not on file    FAMILY HISTORY: Family History  Problem Relation Age of Onset   Hypertension Mother    Pancreatic cancer Father 52   Breast cancer Sister        dx 41s   Lung cancer Sister        d. 54    Review of Systems  Constitutional:  Negative for appetite change, chills, fatigue, fever and unexpected weight change.  HENT:   Negative for hearing loss, lump/mass and trouble swallowing.   Eyes:  Negative for eye problems and icterus.  Respiratory:  Negative for chest tightness, cough and shortness of breath.   Cardiovascular:  Negative for chest pain, leg swelling and palpitations.  Gastrointestinal:  Negative for abdominal distention, abdominal pain, constipation, diarrhea, nausea and vomiting.  Endocrine: Negative for hot flashes.  Genitourinary:  Negative for difficulty urinating.   Musculoskeletal:  Negative for arthralgias.  Skin:  Negative for itching and rash.  Neurological:  Negative for dizziness, extremity weakness, headaches and numbness.  Hematological:  Negative for adenopathy. Does not bruise/bleed easily.  Psychiatric/Behavioral:  Negative for depression. The patient is not nervous/anxious.       PHYSICAL EXAMINATION  ECOG PERFORMANCE STATUS: 1 - Symptomatic but completely ambulatory  Vitals:   04/09/22 1134  BP: 134/69  Pulse: 89  Resp: 16  Temp: 97.9 F (36.6 C)    Physical Exam Constitutional:      General: She is not in acute distress.    Appearance: Normal appearance. She is not  toxic-appearing.  HENT:     Head: Normocephalic and atraumatic.  Eyes:     General: No scleral icterus. Cardiovascular:     Rate and Rhythm: Normal rate and regular rhythm.     Pulses: Normal pulses.     Heart sounds: Normal heart sounds.  Pulmonary:     Effort: Pulmonary effort is normal.     Breath sounds: Normal breath sounds.  Abdominal:     General: Abdomen is flat. Bowel sounds are normal. There is no distension.     Palpations: Abdomen is soft.     Tenderness: There is no abdominal tenderness.  Musculoskeletal:        General: No swelling.     Cervical back: Neck supple.  Lymphadenopathy:     Cervical: No cervical adenopathy.  Skin:    General: Skin is warm and dry.     Findings: No rash.  Neurological:     General: No focal deficit present.     Mental Status: She is alert.  Psychiatric:  Mood and Affect: Mood normal.        Behavior: Behavior normal.     LABORATORY DATA:  CBC    Component Value Date/Time   WBC 16.4 (H) 04/09/2022 1128   WBC 5.4 06/13/2021 1443   RBC 3.33 (L) 04/09/2022 1128   HGB 9.9 (L) 04/09/2022 1128   HCT 30.3 (L) 04/09/2022 1128   PLT 161 04/09/2022 1128   MCV 91.0 04/09/2022 1128   MCH 29.7 04/09/2022 1128   MCHC 32.7 04/09/2022 1128   RDW 14.0 04/09/2022 1128   LYMPHSABS 0.8 04/09/2022 1128   MONOABS 2.1 (H) 04/09/2022 1128   EOSABS 0.0 04/09/2022 1128   BASOSABS 0.1 04/09/2022 1128    CMP     Component Value Date/Time   NA 138 04/09/2022 1128   K 3.7 04/09/2022 1128   CL 103 04/09/2022 1128   CO2 30 04/09/2022 1128   GLUCOSE 154 (H) 04/09/2022 1128   BUN 5 (L) 04/09/2022 1128   CREATININE 0.86 04/09/2022 1128   CREATININE 1.02 08/29/2021 1408   CALCIUM 9.6 04/09/2022 1128   PROT 6.4 (L) 04/09/2022 1128   ALBUMIN 3.8 04/09/2022 1128   AST 19 04/09/2022 1128   ALT 16 04/09/2022 1128   ALKPHOS 87 04/09/2022 1128   BILITOT 0.2 (L) 04/09/2022 1128   GFRNONAA >60 04/09/2022 1128   GFRNONAA 63 11/01/2020 1414    GFRAA 73 11/01/2020 1414        ASSESSMENT and THERAPY PLAN:   Malignant neoplasm of upper-outer quadrant of left breast in female, estrogen receptor positive ([place]) Veronica Robles is a 66 year old woman with stage IIA ER positive breast cancer here today for f/u and treatment with Adriamycin and Cytoxan.  Veronica Robles and I reviewed her labs which are stable.  She will proceed with her second treatment today.  She will continue her antiemetics for her nausea.  I gave suggestions on dietary modifications with ensure or by adding a couple of drops of vinegar.  I also placed a referral for her to meet with our cancer center nutritionist.    Veronica Robles will return in 2 weeks for labs, f/u, and her next treatment.     All questions were answered. The patient knows to call the clinic with any problems, questions or concerns. We can certainly see the patient much sooner if necessary.  Total encounter time:20 minutes*in face-to-face visit time, chart review, lab review, care coordination, order entry, and documentation of the encounter time.  Wilber Bihari, NP 04/13/22 7:52 PM Medical Oncology and Hematology [facility] [place], [place] [address] Tel. [phone]    Fax. [phone]  *Total Encounter Time as defined by the Centers for Medicare and Medicaid Services includes, in addition to the face-to-face time of a patient visit (documented in the note above) non-face-to-face time: obtaining and reviewing outside history, ordering and reviewing medications, tests or procedures, care coordination (communications with other health care professionals or caregivers) and documentation in the medical record.

## 2022-04-09 NOTE — Patient Instructions (Signed)
[facility] MEDICAL ONCOLOGY  Discharge Instructions: Thank you for choosing [facility] to provide your oncology and hematology care.   If you have a lab appointment with the Cancer Center, please go directly to the Cancer Center and check in at the registration area.   Wear comfortable clothing and clothing appropriate for easy access to any Portacath or PICC line.   We strive to give you quality time with your provider. You may need to reschedule your appointment if you arrive late (15 or more minutes).  Arriving late affects you and other patients whose appointments are after yours.  Also, if you miss three or more appointments without notifying the office, you may be dismissed from the clinic at the provider's discretion.      For prescription refill requests, have your pharmacy contact our office and allow 72 hours for refills to be completed.    Today you received the following chemotherapy and/or immunotherapy agents: doxorubicin and cyclophosphamide      To help prevent nausea and vomiting after your treatment, we encourage you to take your nausea medication as directed.  BELOW ARE SYMPTOMS THAT SHOULD BE REPORTED IMMEDIATELY: *FEVER GREATER THAN 100.4 F (38 C) OR HIGHER *CHILLS OR SWEATING *NAUSEA AND VOMITING THAT IS NOT CONTROLLED WITH YOUR NAUSEA MEDICATION *UNUSUAL SHORTNESS OF BREATH *UNUSUAL BRUISING OR BLEEDING *URINARY PROBLEMS (pain or burning when urinating, or frequent urination) *BOWEL PROBLEMS (unusual diarrhea, constipation, pain near the anus) TENDERNESS IN MOUTH AND THROAT WITH OR WITHOUT PRESENCE OF ULCERS (sore throat, sores in mouth, or a toothache) UNUSUAL RASH, SWELLING OR PAIN  UNUSUAL VAGINAL DISCHARGE OR ITCHING   Items with * indicate a potential emergency and should be followed up as soon as possible or go to the Emergency Department if any problems should occur.  Please show the CHEMOTHERAPY ALERT CARD or IMMUNOTHERAPY  ALERT CARD at check-in to the Emergency Department and triage nurse.  Should you have questions after your visit or need to cancel or reschedule your appointment, please contact [facility] MEDICAL ONCOLOGY  Dept: [phone]  and follow the prompts.  Office hours are 8:00 a.m. to 4:30 p.m. Monday - Friday. Please note that voicemails left after 4:00 p.m. may not be returned until the following business day.  We are closed weekends and major holidays. You have access to a nurse at all times for urgent questions. Please call the main number to the clinic Dept: [phone] and follow the prompts.   For any non-urgent questions, you may also contact your provider using MyChart. We now offer e-Visits for anyone 18 and older to request care online for non-urgent symptoms. For details visit [email or web address].   Also download the MyChart app! Go to the app store, search "MyChart", open the app, select [facility], and log in with your MyChart username and password.  Masks are optional in the cancer centers. If you would like for your care team to wear a mask while they are taking care of you, please let them know. You may have one support person who is at least 66 years old accompany you for your appointments. 

## 2022-04-09 NOTE — Patient Instructions (Signed)

## 2022-04-10 ENCOUNTER — Other Ambulatory Visit (HOSPITAL_COMMUNITY): Payer: Self-pay

## 2022-04-10 ENCOUNTER — Telehealth: Payer: Self-pay | Admitting: Hematology and Oncology

## 2022-04-10 NOTE — Telephone Encounter (Signed)
Scheduled per 9/14 in basket, message has been left

## 2022-04-11 ENCOUNTER — Inpatient Hospital Stay: Payer: Medicare HMO | Admitting: Dietician

## 2022-04-11 ENCOUNTER — Other Ambulatory Visit: Payer: Self-pay

## 2022-04-11 ENCOUNTER — Inpatient Hospital Stay: Payer: Medicare HMO

## 2022-04-11 VITALS — BP 127/75 | Temp 98.4°F | Resp 18

## 2022-04-11 DIAGNOSIS — Z5111 Encounter for antineoplastic chemotherapy: Secondary | ICD-10-CM | POA: Diagnosis not present

## 2022-04-11 DIAGNOSIS — C50412 Malignant neoplasm of upper-outer quadrant of left female breast: Secondary | ICD-10-CM

## 2022-04-11 MED ORDER — PEGFILGRASTIM-CBQV 6 MG/0.6ML ~~LOC~~ SOSY
6.0000 mg | PREFILLED_SYRINGE | Freq: Once | SUBCUTANEOUS | Status: AC
  Administered 2022-04-11: 6 mg via SUBCUTANEOUS
  Filled 2022-04-11: qty 0.6

## 2022-04-11 NOTE — Progress Notes (Signed)
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 She is receiving adjuvant dose dense AC q14d + paclitaxel q7d. Patient is under the care of Dr. Lindi Adie.   Past medical history includes, GERD, HIV, recurrent shingles, HTN, cryptococcal meningitis.   Met with patient in office. She is 15 minutes late for appointment. She reports energy was better after this last treatment. Patient is grateful for this. She endorses diminished/off taste of foods. Patient is making herself eat anyway as she knows this is important. Says she loves vegetables. Patient does not eat much meat, recalls occasional hamburger and chicken tenders. Patient recalls 3 meals/day. Has cup of coffee, bowl of frosted flakes for breakfast. Lunch is usually a sandwich (turkey/miracle whip) and sliced cucumbers or a big salad. She enjoys salads from [facility] and [employer]. Dinner is typically a variety of veggies (broccoli/cheese, baked potato, spinach, beets, asparagus). Patient recalls constantly drinking water as well as cranberry, apple juice and lemonade. Patient previously drank Ensure. This was helpful after surgery when appetite was low. She has not been drinking them recently as she is trying to eat the best she can. Patient denies nausea, vomiting, diarrhea, constipation.    Nutrition Focused Physical Exam: deferred    Medications: decadron, descovy, zofran, compazine, sunlenca, rukobia   Labs: 9/13 - glucose 154, BUN 5, Hgb 9.9   Anthropometrics:   Height: 5'2" Weight: 129 lb 9.6 oz UBW: 133 lb (April)  BMI: 23.16   NUTRITION DIAGNOSIS: Food and nutrition related knowledge deficit related to cancer and associated treatments as evidenced by no prior need for related nutrition information    INTERVENTION:  Educated on increased calorie and protein energy intake to maintain weights/strength  Encouraged  snacks in between meals - handout with ideas provided Educated on foods with protein, recommend source of protein with meals/snacks - handout provided Discussed tips for altered taste, suggested baking soda salt water rinses several times daily before meals - handout with tips + recipe provided  Recommend Ensure once daily for added protein/calories One complimentary case Ensure Plus HP provided  Contact information given   MONITORING, EVALUATION, GOAL: Patient will tolerate increased calories and protein to minimize further weight loss during treatment    Next Visit: To be scheduled with treatment as needed

## 2022-04-13 ENCOUNTER — Encounter: Payer: Self-pay | Admitting: Hematology and Oncology

## 2022-04-13 NOTE — Assessment & Plan Note (Signed)
Veronica Robles is a 65 year old woman with stage IIA ER positive breast cancer here today for f/u and treatment with Adriamycin and Cytoxan.  Veronica Robles and I reviewed her labs which are stable.  She will proceed with her second treatment today.  She will continue her antiemetics for her nausea.  I gave suggestions on dietary modifications with ensure or by adding a couple of drops of vinegar.  I also placed a referral for her to meet with our cancer center nutritionist.    Veronica Robles will return in 2 weeks for labs, f/u, and her next treatment.

## 2022-04-14 ENCOUNTER — Encounter: Payer: Self-pay | Admitting: Hematology and Oncology

## 2022-04-14 ENCOUNTER — Telehealth: Payer: Self-pay

## 2022-04-14 ENCOUNTER — Other Ambulatory Visit (HOSPITAL_COMMUNITY): Payer: Self-pay

## 2022-04-14 NOTE — Progress Notes (Signed)
Pt returned my call and would like to apply for the [employer].  She will bring her proof of income on 04/24/22.  If approved I will give her an expense sheet and my card for any questions or concerns she may have in the future.

## 2022-04-14 NOTE — Telephone Encounter (Signed)
[facility] Patient Advocate Encounter  Patient's medications have been couriered to [facility] from [facility] and will be picked up and administered on patient next office visit on 04/15/22.  Sunlenca tablets, Injection & [place] , CPhT [facility] Patient [facility] for Infectious Disease Phone: [phone] Fax:  [phone]

## 2022-04-15 ENCOUNTER — Ambulatory Visit (INDEPENDENT_AMBULATORY_CARE_PROVIDER_SITE_OTHER): Payer: Medicare HMO | Admitting: Pharmacist

## 2022-04-15 ENCOUNTER — Other Ambulatory Visit: Payer: Self-pay

## 2022-04-15 DIAGNOSIS — B20 Human immunodeficiency virus [HIV] disease: Secondary | ICD-10-CM

## 2022-04-15 MED ORDER — LENACAPAVIR SODIUM 463.5 MG/1.5ML ~~LOC~~ SOLN
927.0000 mg | Freq: Once | SUBCUTANEOUS | Status: AC
  Administered 2022-04-15: 927 mg via SUBCUTANEOUS

## 2022-04-15 NOTE — Progress Notes (Unsigned)
 04/15/2022  HPI: Veronica Robles is a 66 y.o. female who presents to the [facility] pharmacy clinic for HIV follow-up.  Patient Active Problem List   Diagnosis Date Noted   Family history of breast cancer 03/20/2022   Family history of pancreatic cancer 03/20/2022   Malignant neoplasm of upper-outer quadrant of left breast in female, estrogen receptor positive (HCC) 03/07/2022   Dental caries 12/01/2021   Other viral warts 12/01/2021   Healthcare maintenance 11/09/2018   VISUAL IMPAIRMENT 10/01/2007   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   HYPERTENSION, BENIGN 01/26/2007   SHINGLES, RECURRENT 12/08/2006   HIV disease (HCC) 10/26/2006   HX, PERSONAL, PENICILLIN ALLERGY 10/26/2006   HX, PERSONAL, DRUG ALLERGY NOS 10/26/2006    Patient's Medications  New Prescriptions   No medications on file  Previous Medications   BLOOD PRESSURE MONITORING (ADULT BLOOD PRESSURE CUFF LG) KIT        DEXAMETHASONE (DECADRON) 4 MG TABLET    Take 1 tablet by mouth the day after chemo and 1 tablet 2 days after chemo; take with food   EMTRICITABINE-TENOFOVIR AF (DESCOVY) 200-25 MG TABLET    Take 1 tablet by mouth daily.   FOSTEMSAVIR TROMETHAMINE (RUKOBIA) 600 MG TB12 ER TABLET    Take 1 tablet (600 mg total) by mouth 2 (two) times daily.   HYDROCHLOROTHIAZIDE (HYDRODIURIL) 25 MG TABLET    TAKE 1 TABLET(25 MG) BY MOUTH DAILY   LENACAPAVIR (SUNLENCA) 300 MG TABLET    Take 2 tablets by mouth See admin instructions. This is the 2-day initiation kit: Take two 300-mg tablets on Day 1 & Day 2 with or without food.   LIDOCAINE-PRILOCAINE (EMLA) CREAM    Apply to affected area once   ONDANSETRON (ZOFRAN) 8 MG TABLET    Take 1 tablet (8 mg) by mouth every 8 hours as needed for nausea/vomiting. Start third day after doxorubicin/cyclophosphamide chemotherapy.   PROCHLORPERAZINE (COMPAZINE) 10 MG TABLET    Take 1 tablet (10 mg total) by mouth every 6 (six) hours as needed for nausea or vomiting.   SQ INJECTION LENACAPAVIR  (SUNLENCA) 463.5 MG/1.5ML SQ INJECTION    Inject 3 mLs (927 mg total) into the skin every 6 (six) months. Administer each injection subcutaneously at separate sites in the abdomen (more or equal to 2 inches from the navel).   TRAMADOL (ULTRAM) 50 MG TABLET    Take 1 tablet (50 mg total) by mouth every 6 (six) hours as needed for moderate pain or severe pain.  Modified Medications   No medications on file  Discontinued Medications   No medications on file    Allergies: Allergies  Allergen Reactions   Penicillins Rash    Has patient had a PCN reaction causing immediate rash, facial/tongue/throat swelling, SOB or lightheadedness with hypotension: No Has patient had a PCN reaction causing severe rash involving mucus membranes or skin necrosis: No Has patient had a PCN reaction that required hospitalization No Has patient had a PCN reaction occurring within the last 10 years: No If all of the above answers are "NO", then may proceed with Cephalosporin use.     Past Medical 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   Family history of breast cancer 03/20/2022   Family history of pancreatic cancer 03/20/2022   HIV infection (HCC) 1993   Hypertension     Social History: Social History   Socioeconomic History   Marital status: Single      Spouse name: Not on file   Number of children: Not on file   Years of education: Not on file   Highest education level: Not on file  Occupational History   Not on file  Tobacco Use   Smoking status: Former    Packs/day: 0.10    Types: Cigarettes   Smokeless tobacco: Never  Vaping Use   Vaping Use: Never used  Substance and Sexual Activity   Alcohol use: No    Alcohol/week: 0.0 standard drinks of alcohol   Drug use: No   Sexual activity: Not Currently    Partners: Male    Birth control/protection: Surgical    Comment: declined condoms  Other Topics Concern   Not on file   Social History Narrative   Not on file   Social Determinants of Health   Financial Resource Strain: Not on file  Food Insecurity: Not on file  Transportation Needs: Not on file  Physical Activity: Not on file  Stress: Not on file  Social Connections: Not on file    Labs: Lab Results  Component Value Date   HIV1RNAQUANT 2,880 (H) 03/21/2022   HIV1RNAQUANT 16,100 (H) 09/17/2021   HIV1RNAQUANT 317 (H) 06/13/2021   CD4TABS 302 (L) 06/13/2021   CD4TABS 278 (L) 11/01/2020   CD4TABS 360 (L) 05/01/2020    RPR and STI Lab Results  Component Value Date   LABRPR NON-REACTIVE 05/01/2020   LABRPR NON-REACTIVE 05/10/2018   LABRPR NON REAC 10/30/2015   LABRPR NON REAC 05/24/2015   LABRPR NON REAC 12/05/2013    STI Results GC CT  05/01/2020  2:25 PM Negative  Negative   03/04/2016 12:00 AM Negative  Negative   12/05/2013 12:00 AM  CT: Negative     Hepatitis B Lab Results  Component Value Date   HEPBSAB INDETER (A) 08/08/2009   HEPBSAG NEG 08/08/2009   HEPBCAB POS (A) 08/08/2009   Hepatitis C No results found for: "HEPCAB", "HCVRNAPCRQN" Hepatitis A Lab Results  Component Value Date   HAV POS (A) 08/08/2009   Lipids: Lab Results  Component Value Date   CHOL 203 (H) 01/04/2020   TRIG 133 01/04/2020   HDL 51 01/04/2020   CHOLHDL 4.0 01/04/2020   VLDL 37 (H) 10/30/2015   LDLCALC 127 (H) 01/04/2020    Current HIV Regimen: Not currently taking HIV medications due to resistance   Assessment: Pt presents to clinic for new ART regimen. Patient recently had another genotype performed which showed high levels of resistance with most ART drug classes. Patient also recently diagnosed with breast cancer and will be starting Adriamycin, Cytotaxan, and Taxol. After discussion with providers, it was decided to transition patient to [place], [place] and [place]. Patient was counseled to take 2 pills of Sunlenca on day of injections (taken 9/19 in clinic) and 2 Sunlenca pills the  day following her initial injection (due 04/16/22) which was placed in a pill box for her. She had difficulty taking her Sunlenca pills, but was ultimately able to swallow the medication. Counseled on adverse effects of the Sunlenca injection including burning with administration and knots formed under the skin which represent a depot of the medication, which is a normal finding.  Patient further counseled to take [place] twice daily and that as the size of the medications can make them difficult to swallow, coating them with honey may make them easier to swallow. Patient counseled not to split [place] tablets. Patient stated she wishes to take Descovy in the mornings. Pill boxes were filled  in clinic with first 2 weeks of medications with patient's consent by Cassie Kuppelweiser, PharmD, CPP in an effort to improve adherence.   Patient states she has been feeling rather fatigued with her recent chemotherapy and that she is normally a "lively" person. Also reports some nausea with chemotherapy for which she has been prescribed antiemetics. Otherwise reports she is well, and states she understands how to take her medications.   Administered lenacapavir 463.5/1.5mL subcutaneously in upper right abdomen. Administered lenacapavir 463.5/1.5mL subcutaneously in lower right abdomen more than two inches away from first injection site. Monitored patient for 10 minutes after injection. Injections were tolerated well, but patient did report burning in 10 minutes following injections, which then resolved. Will need a follow up appointment for injections in 6 months.  Plan: Sunlenca injection x2 administered in clinic, Sunlenca tablets x2 administered in clinic  Complete Sunlenca load with two tablets 04/16/22  Pill boxes filled in clinic  START Descovy daily, and Rukobia twice daily Check anti-Hbs with next labs   Next appointment scheduled for 04/29/22 with Cassie Kuppelweiser to refill pill boxes  Call with  questions and concerns   Austin Paytes, PharmD PGY-2 Infectious Diseases Resident  04/15/2022 2:52 PM     

## 2022-04-21 ENCOUNTER — Telehealth: Payer: Self-pay | Admitting: Pharmacist

## 2022-04-21 NOTE — Telephone Encounter (Signed)
Called Gavyn to see how she was doing with her new HIV regimen. No answer, left HIPAA compliant VM.  Beulah Capobianco L. Mattheu Brodersen, PharmD [facility] Clinical Pharmacist Practitioner

## 2022-04-22 NOTE — Telephone Encounter (Signed)
Talked to Parul this afternoon to see how she was doing. She states she is doing pretty good and has taken her HIV medications every day since she started with no missed doses. It does take her about an hour to take the [place] each time due to swallowing difficulty but states that she powers through. She has nodules that have formed from the [facility] injections, but they are not red, leaking, or bothersome. I told her that was expected and that is the drug depot and will go down with time. She is not experiencing any side effects. I congratulated her, told her I was proud of her, and encouraged her to continue. She will see me next Tuesday 10/3 and will call if she has any issues that come up.  Dyasia Firestine L. Thierno Hun, PharmD, BCIDP, AAHIVP, CPP Clinical Pharmacist Practitioner Infectious Diseases [facility] for Infectious Disease 04/22/2022, 2:46 PM

## 2022-04-23 MED FILL — Fosaprepitant Dimeglumine For IV Infusion 150 MG (Base Eq): INTRAVENOUS | Qty: 5 | Status: AC

## 2022-04-23 MED FILL — Dexamethasone Sodium Phosphate Inj 100 MG/10ML: INTRAMUSCULAR | Qty: 1 | Status: AC

## 2022-04-24 ENCOUNTER — Inpatient Hospital Stay: Payer: Medicare HMO

## 2022-04-24 ENCOUNTER — Inpatient Hospital Stay (HOSPITAL_BASED_OUTPATIENT_CLINIC_OR_DEPARTMENT_OTHER): Payer: Medicare HMO | Admitting: Adult Health

## 2022-04-24 ENCOUNTER — Encounter: Payer: Self-pay | Admitting: Adult Health

## 2022-04-24 ENCOUNTER — Other Ambulatory Visit: Payer: Self-pay

## 2022-04-24 ENCOUNTER — Encounter: Payer: Self-pay | Admitting: Hematology and Oncology

## 2022-04-24 DIAGNOSIS — C50412 Malignant neoplasm of upper-outer quadrant of left female breast: Secondary | ICD-10-CM | POA: Diagnosis not present

## 2022-04-24 DIAGNOSIS — Z5111 Encounter for antineoplastic chemotherapy: Secondary | ICD-10-CM | POA: Diagnosis not present

## 2022-04-24 DIAGNOSIS — Z170 Estrogen receptor positive status [ER+]: Secondary | ICD-10-CM

## 2022-04-24 LAB — CMP (CANCER CENTER ONLY)
ALT: 16 U/L (ref 0–44)
AST: 20 U/L (ref 15–41)
Albumin: 3.9 g/dL (ref 3.5–5.0)
Alkaline Phosphatase: 132 U/L — ABNORMAL HIGH (ref 38–126)
Anion gap: 9 (ref 5–15)
BUN: 9 mg/dL (ref 8–23)
CO2: 31 mmol/L (ref 22–32)
Calcium: 9.2 mg/dL (ref 8.9–10.3)
Chloride: 101 mmol/L (ref 98–111)
Creatinine: 0.75 mg/dL (ref 0.44–1.00)
GFR, Estimated: >60 mL/min (ref >60)
Glucose, Bld: 164 mg/dL — ABNORMAL HIGH (ref 70–99)
Potassium: 3.4 mmol/L — ABNORMAL LOW (ref 3.5–5.1)
Sodium: 141 mmol/L (ref 135–145)
Total Bilirubin: 0.2 mg/dL — ABNORMAL LOW (ref 0.3–1.2)
Total Protein: 6.8 g/dL (ref 6.5–8.1)

## 2022-04-24 LAB — CBC WITH DIFFERENTIAL (CANCER CENTER ONLY)
Abs Immature Granulocytes: 2.51 10*3/uL — ABNORMAL HIGH (ref 0.00–0.07)
Basophils Absolute: 0.1 10*3/uL (ref 0.0–0.1)
Basophils Relative: 1 %
Eosinophils Absolute: 0 10*3/uL (ref 0.0–0.5)
Eosinophils Relative: 0 %
HCT: 28.5 % — ABNORMAL LOW (ref 36.0–46.0)
Hemoglobin: 9.4 g/dL — ABNORMAL LOW (ref 12.0–15.0)
Immature Granulocytes: 12 %
Lymphocytes Relative: 5 %
Lymphs Abs: 1.1 10*3/uL (ref 0.7–4.0)
MCH: 29.4 pg (ref 26.0–34.0)
MCHC: 33 g/dL (ref 30.0–36.0)
MCV: 89.1 fL (ref 80.0–100.0)
Monocytes Absolute: 1.8 10*3/uL — ABNORMAL HIGH (ref 0.1–1.0)
Monocytes Relative: 9 %
Neutro Abs: 15.4 10*3/uL — ABNORMAL HIGH (ref 1.7–7.7)
Neutrophils Relative %: 73 %
Platelet Count: 170 10*3/uL (ref 150–400)
RBC: 3.2 MIL/uL — ABNORMAL LOW (ref 3.87–5.11)
RDW: 15.5 % (ref 11.5–15.5)
Smear Review: NORMAL
WBC Count: 21 10*3/uL — ABNORMAL HIGH (ref 4.0–10.5)
nRBC: 0.2 % (ref 0.0–0.2)

## 2022-04-24 MED ORDER — SODIUM CHLORIDE 0.9% FLUSH
10.0000 mL | INTRAVENOUS | Status: DC | PRN
  Administered 2022-04-24: 10 mL via INTRAVENOUS

## 2022-04-24 MED ORDER — SODIUM CHLORIDE 0.9 % IV SOLN
400.0000 mg/m2 | Freq: Once | INTRAVENOUS | Status: AC
  Administered 2022-04-24: 640 mg via INTRAVENOUS
  Filled 2022-04-24: qty 32

## 2022-04-24 MED ORDER — SODIUM CHLORIDE 0.9 % IV SOLN
150.0000 mg | Freq: Once | INTRAVENOUS | Status: AC
  Administered 2022-04-24: 150 mg via INTRAVENOUS
  Filled 2022-04-24: qty 150

## 2022-04-24 MED ORDER — SODIUM CHLORIDE 0.9% FLUSH
10.0000 mL | INTRAVENOUS | Status: DC | PRN
  Administered 2022-04-24: 10 mL

## 2022-04-24 MED ORDER — HEPARIN SOD (PORK) LOCK FLUSH 100 UNIT/ML IV SOLN
500.0000 [IU] | Freq: Once | INTRAVENOUS | Status: AC | PRN
  Administered 2022-04-24: 500 [IU]

## 2022-04-24 MED ORDER — PALONOSETRON HCL INJECTION 0.25 MG/5ML
0.2500 mg | Freq: Once | INTRAVENOUS | Status: AC
  Administered 2022-04-24: 0.25 mg via INTRAVENOUS
  Filled 2022-04-24: qty 5

## 2022-04-24 MED ORDER — DOXORUBICIN HCL CHEMO IV INJECTION 2 MG/ML
40.0000 mg/m2 | Freq: Once | INTRAVENOUS | Status: AC
  Administered 2022-04-24: 64 mg via INTRAVENOUS
  Filled 2022-04-24: qty 32

## 2022-04-24 MED ORDER — SODIUM CHLORIDE 0.9 % IV SOLN
10.0000 mg | Freq: Once | INTRAVENOUS | Status: AC
  Administered 2022-04-24: 10 mg via INTRAVENOUS
  Filled 2022-04-24: qty 10

## 2022-04-24 MED ORDER — SODIUM CHLORIDE 0.9 % IV SOLN: Freq: Once | INTRAVENOUS | Status: AC

## 2022-04-24 NOTE — Assessment & Plan Note (Signed)
Veronica Robles is a 66 year old woman with stage IIA ER positive breast cancer here today for f/u and treatment with Adriamycin and Cytoxan.  [name] is doing well today.  She does have decreased appetite and is drinking Ensure and following up with nutrition about that.  We discussed the area in her mouth.  She has no signs of a developing dental abscess however she will let us know if she starts to experience pain in the right maxillary area because we can either send in antibiotics or get her in with our dentist to get a Panorex of the area.  I reviewed her labs with her today which are stable and she will proceed with treatment.  She is slightly anemic which I reviewed is from her chemotherapy and we will monitor it.  She is not at the point where she would require blood transfusion.  She really will return in 2 weeks for labs, follow-up, and her next treatment.  She knows to call at any point should she develop any new symptoms or concerns between now and her next visit because we can always see her sooner.

## 2022-04-24 NOTE — Patient Instructions (Signed)
[facility]  Discharge Instructions: Thank you for choosing [facility] to provide your oncology and hematology care.   If you have a lab appointment with the [facility], please go directly to the [facility] and check in at the registration area.   Wear comfortable clothing and clothing appropriate for easy access to any Portacath or PICC line.   We strive to give you quality time with your provider. You may need to reschedule your appointment if you arrive late (15 or more minutes).  Arriving late affects you and other patients whose appointments are after yours.  Also, if you miss three or more appointments without notifying the office, you may be dismissed from the clinic at the provider's discretion.      For prescription refill requests, have your pharmacy contact our office and allow 72 hours for refills to be completed.    Today you received the following chemotherapy and/or immunotherapy agents: cyclophosphamide, doxorubicin      To help prevent nausea and vomiting after your treatment, we encourage you to take your nausea medication as directed.  BELOW ARE SYMPTOMS THAT SHOULD BE REPORTED IMMEDIATELY: *FEVER GREATER THAN 100.4 F (38 C) OR HIGHER *CHILLS OR SWEATING *NAUSEA AND VOMITING THAT IS NOT CONTROLLED WITH YOUR NAUSEA MEDICATION *UNUSUAL SHORTNESS OF BREATH *UNUSUAL BRUISING OR BLEEDING *URINARY PROBLEMS (pain or burning when urinating, or frequent urination) *BOWEL PROBLEMS (unusual diarrhea, constipation, pain near the anus) TENDERNESS IN MOUTH AND THROAT WITH OR WITHOUT PRESENCE OF ULCERS (sore throat, sores in mouth, or a toothache) UNUSUAL RASH, SWELLING OR PAIN  UNUSUAL VAGINAL DISCHARGE OR ITCHING   Items with * indicate a potential emergency and should be followed up as soon as possible or go to the Emergency Department if any problems should occur.  Please show the CHEMOTHERAPY ALERT CARD or IMMUNOTHERAPY ALERT  CARD at check-in to the Emergency Department and triage nurse.  Should you have questions after your visit or need to cancel or reschedule your appointment, please contact [facility]  Dept: [phone]  and follow the prompts.  Office hours are 8:00 a.m. to 4:30 p.m. Monday - Friday. Please note that voicemails left after 4:00 p.m. may not be returned until the following business day.  We are closed weekends and major holidays. You have access to a nurse at all times for urgent questions. Please call the main number to the clinic Dept: [phone] and follow the prompts.   For any non-urgent questions, you may also contact your provider using MyChart. We now offer e-Visits for anyone 35 and older to request care online for non-urgent symptoms. For details visit [email or web address].   Also download the MyChart app! Go to the app store, search "MyChart", open the app, select [facility], and log in with your MyChart username and password.  Masks are optional in the cancer centers. If you would like for your care team to wear a mask while they are taking care of you, please let them know. You may have one support person who is at least 66 years old accompany you for your appointments.

## 2022-04-24 NOTE — Progress Notes (Signed)
[facility] Follow up:    Veronica Mai, MD [phone] [place] [address]   DIAGNOSIS:  Cancer Staging  Malignant neoplasm of upper-outer quadrant of left breast in female, estrogen receptor positive ([place]) Staging form: Breast, AJCC 8th Edition - Clinical stage from 03/10/2022: Stage IIA (cT1b, cN1(f), cM0, G2, ER+, PR-, HER2-) - Signed by Hayden Pedro, PA-C on 03/10/2022 Stage prefix: Initial diagnosis Method of lymph node assessment: Core biopsy Histologic grading system: 3 grade system   SUMMARY OF ONCOLOGIC HISTORY: Oncology History  Malignant neoplasm of upper-outer quadrant of left breast in female, estrogen receptor positive ([place])  02/28/2022 Initial Diagnosis   Screening detected left breast mass at 3 o'clock position measuring 0.9 cm, 4 enlarged left axillary lymph nodes along with asymmetric ducts between mass and the nipple possibly DCIS; lymph node biopsy: Positive, breast biopsy: Grade 2 IDC ER 100%, PR 0%, HER2 negative, Ki-67 10%   03/10/2022 Cancer Staging   Staging form: Breast, AJCC 8th Edition - Clinical stage from 03/10/2022: Stage IIA (cT1b, cN1(f), cM0, G2, ER+, PR-, HER2-) - Signed by Hayden Pedro, PA-C on 03/10/2022 Stage prefix: Initial diagnosis Method of lymph node assessment: Core biopsy Histologic grading system: 3 grade system   03/27/2022 -  Chemotherapy   Patient is on Treatment Plan : BREAST ADJUVANT DOSE DENSE AC q14d / PACLitaxel q7d       CURRENT THERAPY: Doxorubicin and cyclophosphamide  INTERVAL HISTORY: Veronica Robles 66 y.o. female returns for evaluation prior to receiving her neoadjuvant chemotherapy with doxorubicin and cyclophosphamide.  This is given on day 1 of a 14-day cycle with Neulasta or biosimilar support given on day 3.  Her weight has decreased by another 2 pounds and she is meeting with our nutrition team regularly.  She is drinking ensure daily and is eating bland foods.     When she chews she notes some soreness in her right maxillary area.  She applies an ice pack and the pain resolves.  She notes she has some dental concerns in that area.       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  MEDICAL HISTORY: Past Medical History:  Diagnosis Date   Breast cancer ([place]) 02/2022   left breast IDC with DCIS   Family history of breast cancer 03/20/2022   Family history of pancreatic cancer 03/20/2022   HIV infection ([place]) 1993   Hypertension     SURGICAL HISTORY: Past Surgical History:  Procedure Laterality Date   ABDOMINAL HYSTERECTOMY     APPENDECTOMY     BREAST BIOPSY Right 2015   CYSTOSCOPY/RETROGRADE/URETEROSCOPY Right 11/14/2016   Procedure: CYSTOSCOPY/RETROGRADE/URETEROSCOPY/ STONE EXTRACTION/HOLMIUM LASER/STENT PLACEMENT;  Surgeon: Kathie Rhodes, MD;  Location: [facility];  Service: Urology;  Laterality: Right;   PORTACATH PLACEMENT Right 03/26/2022   Procedure: INSERTION PORT-A-CATH;  Surgeon: [name], MD;  Location: [facility];  Service: General;  Laterality: Right;    SOCIAL HISTORY: Social History   Socioeconomic History   Marital status: Single    Spouse name: Not on file   Number of children: Not on file   Years of education: Not on file   Highest education level: Not on file  Occupational History   Not on file   Tobacco Use   Smoking status: Former    Packs/day: 0.10    Types: Cigarettes   Smokeless tobacco: Never  Vaping Use   Vaping Use: Never used  Substance and Sexual Activity   Alcohol use: No    Alcohol/week: 0.0 standard drinks of alcohol   Drug use: No   Sexual activity: Not Currently    Partners: Male    Birth control/protection: Surgical    Comment: declined condoms  Other Topics Concern   Not on file  Social History Narrative   Not on file   Social Determinants of Health   Financial Resource Strain: Not on file  Food Insecurity: Not on file  Transportation Needs: Not on file  Physical Activity: Not on file  Stress: Not on file  Social Connections: Not on file  Intimate Partner Violence: Not on file    FAMILY HISTORY: Family History  Problem Relation Age of Onset   Hypertension Mother    Pancreatic cancer Father 85   Breast cancer Sister        dx 32s   Lung cancer Sister        d. 59    Review of Systems  Constitutional:  Positive for fatigue (Mild). Negative for appetite change, chills, fever and unexpected weight change.  HENT:   Negative for hearing loss, lump/mass and trouble swallowing.   Eyes:  Negative for eye problems and icterus.  Respiratory:  Negative for chest tightness, cough and shortness of breath.   Cardiovascular:  Negative for chest pain, leg swelling and palpitations.  Gastrointestinal:  Negative for abdominal distention, abdominal pain, constipation, diarrhea, nausea and vomiting.  Endocrine: Negative for hot flashes.  Genitourinary:  Negative for difficulty urinating.   Musculoskeletal:  Negative for arthralgias.  Skin:  Negative for itching and rash.  Neurological:  Negative for dizziness, extremity weakness, headaches and numbness.  Hematological:  Negative for adenopathy. Does not bruise/bleed easily.  Psychiatric/Behavioral:  Negative for depression. The patient is not nervous/anxious.       PHYSICAL EXAMINATION  ECOG  PERFORMANCE STATUS: 1 - Symptomatic but completely ambulatory  Vitals:   04/24/22 1110  BP: 136/71  Pulse: 87  Resp: 18  Temp: 97.7 F (36.5 C)  SpO2: 100%    Physical Exam Constitutional:      General: She is not in acute distress.    Appearance: Normal appearance. She is not toxic-appearing.  HENT:     Head: Normocephalic and atraumatic.  Eyes:     General: No scleral icterus. Cardiovascular:     Rate and Rhythm: Normal rate and regular rhythm.     Pulses: Normal pulses.     Heart sounds: Normal heart sounds.  Pulmonary:     Effort: Pulmonary effort is normal.     Breath sounds: Normal breath sounds.  Abdominal:     General: Abdomen is flat. Bowel sounds are normal. There is no distension.     Palpations: Abdomen is soft.     Tenderness: There is no abdominal tenderness.  Musculoskeletal:        General: No swelling.     Cervical back: Neck supple.  Lymphadenopathy:  Cervical: No cervical adenopathy.  Skin:    General: Skin is warm and dry.     Findings: No rash.  Neurological:     General: No focal deficit present.     Mental Status: She is alert.  Psychiatric:        Mood and Affect: Mood normal.        Behavior: Behavior normal.     LABORATORY DATA:  CBC    Component Value Date/Time   WBC 21.0 (H) 04/24/2022 1022   WBC 5.4 06/13/2021 1443   RBC 3.20 (L) 04/24/2022 1022   HGB 9.4 (L) 04/24/2022 1022   HCT 28.5 (L) 04/24/2022 1022   PLT 170 04/24/2022 1022   MCV 89.1 04/24/2022 1022   MCH 29.4 04/24/2022 1022   MCHC 33.0 04/24/2022 1022   RDW 15.5 04/24/2022 1022   LYMPHSABS 1.1 04/24/2022 1022   MONOABS 1.8 (H) 04/24/2022 1022   EOSABS 0.0 04/24/2022 1022   BASOSABS 0.1 04/24/2022 1022    CMP     Component Value Date/Time   NA 141 04/24/2022 1022   K 3.4 (L) 04/24/2022 1022   CL 101 04/24/2022 1022   CO2 31 04/24/2022 1022   GLUCOSE 164 (H) 04/24/2022 1022   BUN 9 04/24/2022 1022   CREATININE 0.75 04/24/2022 1022   CREATININE 1.02  08/29/2021 1408   CALCIUM 9.2 04/24/2022 1022   PROT 6.8 04/24/2022 1022   ALBUMIN 3.9 04/24/2022 1022   AST 20 04/24/2022 1022   ALT 16 04/24/2022 1022   ALKPHOS 132 (H) 04/24/2022 1022   BILITOT 0.2 (L) 04/24/2022 1022   GFRNONAA >60 04/24/2022 1022   GFRNONAA 63 11/01/2020 1414   GFRAA 73 11/01/2020 1414     ASSESSMENT and THERAPY PLAN:   Malignant neoplasm of upper-outer quadrant of left breast in female, estrogen receptor positive ([place]) Veronica Robles is a 66 year old woman with stage IIA ER positive breast cancer here today for f/u and treatment with Adriamycin and Cytoxan.  Veronica Robles is doing well today.  She does have decreased appetite and is drinking Ensure and following up with nutrition about that.  We discussed the area in her mouth.  She has no signs of a developing dental abscess however she will let us know if she starts to experience pain in the right maxillary area because we can either send in antibiotics or get her in with our dentist to get a Panorex of the area.  I reviewed her labs with her today which are stable and she will proceed with treatment.  She is slightly anemic which I reviewed is from her chemotherapy and we will monitor it.  She is not at the point where she would require blood transfusion.  She really will return in 2 weeks for labs, follow-up, and her next treatment.  She knows to call at any point should she develop any new symptoms or concerns between now and her next visit because we can always see her sooner.  All questions were answered. The patient knows to call the clinic with any problems, questions or concerns. We can certainly see the patient much sooner if necessary.  Total encounter time:20 minutes*in face-to-face visit time, chart review, lab review, care coordination, order entry, and documentation of the encounter time.    Wilber Bihari, NP 04/24/22 11:35 AM Medical Oncology [facility] [place], [place] [address] Tel. [phone]    Fax. [phone]  *Total Encounter Time as defined by  the Centers for Medicare and Medicaid Services includes, in addition to the face-to-face time of a patient visit (documented in the note above) non-face-to-face time: obtaining and reviewing outside history, ordering and reviewing medications, tests or procedures, care coordination (communications with other health care professionals or caregivers) and documentation in the medical record.

## 2022-04-24 NOTE — Progress Notes (Signed)
Pt is approved for the $1000 [employer] grant.  

## 2022-04-26 ENCOUNTER — Inpatient Hospital Stay: Payer: Medicare HMO

## 2022-04-26 VITALS — BP 133/77 | HR 63 | Temp 96.8°F | Resp 16 | Ht 62.0 in

## 2022-04-26 DIAGNOSIS — Z5111 Encounter for antineoplastic chemotherapy: Secondary | ICD-10-CM | POA: Diagnosis not present

## 2022-04-26 DIAGNOSIS — C50412 Malignant neoplasm of upper-outer quadrant of left female breast: Secondary | ICD-10-CM

## 2022-04-26 MED ORDER — PEGFILGRASTIM-CBQV 6 MG/0.6ML ~~LOC~~ SOSY
6.0000 mg | PREFILLED_SYRINGE | Freq: Once | SUBCUTANEOUS | Status: AC
  Administered 2022-04-26: 6 mg via SUBCUTANEOUS

## 2022-04-28 ENCOUNTER — Ambulatory Visit (INDEPENDENT_AMBULATORY_CARE_PROVIDER_SITE_OTHER): Payer: Medicare HMO | Admitting: Family Medicine

## 2022-04-28 ENCOUNTER — Encounter: Payer: Self-pay | Admitting: Family Medicine

## 2022-04-28 VITALS — BP 98/66 | HR 112 | Temp 98.3°F | Resp 16 | Ht 62.0 in | Wt 124.4 lb

## 2022-04-28 DIAGNOSIS — I10 Essential (primary) hypertension: Secondary | ICD-10-CM | POA: Diagnosis not present

## 2022-04-28 DIAGNOSIS — Z7689 Persons encountering health services in other specified circumstances: Secondary | ICD-10-CM | POA: Diagnosis not present

## 2022-04-28 DIAGNOSIS — Z170 Estrogen receptor positive status [ER+]: Secondary | ICD-10-CM

## 2022-04-28 DIAGNOSIS — C50412 Malignant neoplasm of upper-outer quadrant of left female breast: Secondary | ICD-10-CM

## 2022-04-28 DIAGNOSIS — B20 Human immunodeficiency virus [HIV] disease: Secondary | ICD-10-CM | POA: Diagnosis not present

## 2022-04-28 MED ORDER — HYDROCHLOROTHIAZIDE 25 MG PO TABS: 25.0000 mg | ORAL_TABLET | Freq: Every day | ORAL | 1 refills | Status: DC

## 2022-04-28 NOTE — Progress Notes (Signed)
New Patient Office Visit  Subjective    Patient ID: Veronica Robles, female    DOB: 1956/07/15  Age: 66 y.o. MRN: [id number]  CC:  Chief Complaint  Patient presents with   Establish Care    HPI Veronica Robles presents to establish care and for review of chronic med issues.    Outpatient Encounter Medications as of 04/28/2022  Medication Sig   Blood Pressure Monitoring (ADULT BLOOD PRESSURE CUFF LG) KIT     dexamethasone (DECADRON) 4 MG tablet Take 1 tablet by mouth the day after chemo and 1 tablet 2 days after chemo; take with food   emtricitabine-tenofovir AF (DESCOVY) 200-25 MG tablet Take 1 tablet by mouth daily.   fostemsavir tromethamine (RUKOBIA) 600 MG TB12 ER tablet Take 1 tablet (600 mg total) by mouth 2 (two) times daily.   lenacapavir (SUNLENCA) 300 mg tablet Take 2 tablets by mouth See admin instructions. This is the 2-day initiation kit: Take two 300-mg tablets on Day 1 & Day 2 with or without food.   lidocaine-prilocaine (EMLA) cream Apply to affected area once   ondansetron (ZOFRAN) 8 MG tablet Take 1 tablet (8 mg) by mouth every 8 hours as needed for nausea/vomiting. Start third day after doxorubicin/cyclophosphamide chemotherapy.   prochlorperazine (COMPAZINE) 10 MG tablet Take 1 tablet (10 mg total) by mouth every 6 (six) hours as needed for nausea or vomiting.   SQ injection lenacapavir (SUNLENCA) 463.5 MG/1.5ML SQ injection Inject 3 mLs (927 mg total) into the skin every 6 (six) months. Administer each injection subcutaneously at separate sites in the abdomen (more or equal to 2 inches from the navel).   [DISCONTINUED] hydrochlorothiazide (HYDRODIURIL) 25 MG tablet TAKE 1 TABLET(25 MG) BY MOUTH DAILY   hydrochlorothiazide (HYDRODIURIL) 25 MG tablet Take 1 tablet (25 mg total) by mouth daily.   traMADol (ULTRAM) 50 MG tablet Take 1 tablet (50 mg total) by mouth every 6 (six) hours as needed for moderate pain or severe pain. (Patient not taking: Reported on  04/24/2022)   Facility-Administered Encounter Medications as of 04/28/2022  Medication   ciprofloxacin (CIPRO) IVPB 400 mg    Past Medical History:  Diagnosis Date   Breast cancer ([place]) 02/2022   left breast IDC with DCIS   Family history of breast cancer 03/20/2022   Family history of pancreatic cancer 03/20/2022   HIV infection ([place]) 1993   Hypertension     Past Surgical History:  Procedure Laterality Date   ABDOMINAL HYSTERECTOMY     APPENDECTOMY     BREAST BIOPSY Right 2015   CYSTOSCOPY/RETROGRADE/URETEROSCOPY Right 11/14/2016   Procedure: CYSTOSCOPY/RETROGRADE/URETEROSCOPY/ STONE EXTRACTION/HOLMIUM LASER/STENT PLACEMENT;  Surgeon: Kathie Rhodes, MD;  Location[facility];  Service: Urology;  Laterality: Right;   PORTACATH PLACEMENT Right 03/26/2022   Procedure: INSERTION PORT-A-CATH;  Surgeon: [name], MD;  Location: [facility];  Service: General;  Laterality: Right;    Family History  Problem Relation Age of Onset   Hypertension Mother    Pancreatic cancer Father 84   Breast cancer Sister        dx 100s   Lung cancer Sister        d. 50    Social History   Socioeconomic History   Marital status: Single    Spouse name: Not on file   Number of children: Not on file   Years of education: Not on file   Highest education level: Not on file  Occupational History   Not on  file  Tobacco Use   Smoking status: Former    Packs/day: 0.10    Types: Cigarettes   Smokeless tobacco: Never  Vaping Use   Vaping Use: Never used  Substance and Sexual Activity   Alcohol use: No    Alcohol/week: 0.0 standard drinks of alcohol   Drug use: No   Sexual activity: Not Currently    Partners: Male    Birth control/protection: Surgical    Comment: declined condoms  Other Topics Concern   Not on file  Social History Narrative   Not on file   Social Determinants of Health   Financial Resource Strain: Not on file  Food Insecurity: Not on file  Transportation  Needs: Not on file  Physical Activity: Not on file  Stress: Not on file  Social Connections: Not on file  Intimate Partner Violence: Not on file    Review of Systems  All other systems reviewed and are negative.       Objective    BP 98/66   Pulse (!) 112   Temp 98.3 F (36.8 C) (Oral)   Resp 16   Ht '5\' 2"'  (1.575 m)   Wt 124 lb 6.4 oz (56.4 kg)   SpO2 99%   BMI 22.75 kg/m   Physical Exam Vitals and nursing note reviewed.  Constitutional:      General: She is not in acute distress. Cardiovascular:     Rate and Rhythm: Normal rate and regular rhythm.  Pulmonary:     Effort: Pulmonary effort is normal.     Breath sounds: Normal breath sounds.  Musculoskeletal:     Right lower leg: No edema.     Left lower leg: No edema.  Neurological:     General: No focal deficit present.     Mental Status: She is alert and oriented to person, place, and time.         Assessment & Plan:   1. HYPERTENSION, BENIGN Appears stable. Continue and monitor - hydrochlorothiazide (HYDRODIURIL) 25 MG tablet; Take 1 tablet (25 mg total) by mouth daily.  Dispense: 90 tablet; Refill: 1  2. HIV disease ([place]) Management per consultant  3. Malignant neoplasm of upper-outer quadrant of left breast in female, estrogen receptor positive ([place]) Management per consultant  4. Encounter to establish care     Return in about 6 months (around 10/28/2022) for follow up.   Becky Sax, MD

## 2022-04-29 ENCOUNTER — Ambulatory Visit (INDEPENDENT_AMBULATORY_CARE_PROVIDER_SITE_OTHER): Payer: Medicare HMO | Admitting: Pharmacist

## 2022-04-29 ENCOUNTER — Other Ambulatory Visit: Payer: Self-pay

## 2022-04-29 ENCOUNTER — Other Ambulatory Visit (HOSPITAL_COMMUNITY): Payer: Self-pay

## 2022-04-29 DIAGNOSIS — Z23 Encounter for immunization: Secondary | ICD-10-CM

## 2022-04-29 DIAGNOSIS — B20 Human immunodeficiency virus [HIV] disease: Secondary | ICD-10-CM

## 2022-04-29 NOTE — Progress Notes (Unsigned)
04/29/2022  HPI: Veronica Robles is a 66 y.o. female who presents to the [facility] for HIV follow-up.  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   FOSTEMSAVIR TROMETHAMINE (RUKOBIA) 600 MG TB12 ER TABLET    Take 1 tablet (600 mg total) by mouth 2 (two) times daily.   HYDROCHLOROTHIAZIDE (HYDRODIURIL) 25 MG TABLET    Take 1 tablet (25 mg total) by mouth daily.   LENACAPAVIR (SUNLENCA) 300 MG TABLET    Take 2 tablets by mouth See admin instructions. This is the 2-day initiation kit: Take two 300-mg tablets on Day 1 & Day 2 with or without food.   LIDOCAINE-PRILOCAINE (EMLA) CREAM    Apply to affected area once   ONDANSETRON (ZOFRAN) 8 MG TABLET    Take 1 tablet (8 mg) by mouth every 8 hours as needed for nausea/vomiting. Start third day after doxorubicin/cyclophosphamide chemotherapy.   PROCHLORPERAZINE (COMPAZINE) 10 MG TABLET    Take 1 tablet (10  mg total) by mouth every 6 (six) hours as needed for nausea or vomiting.   SQ INJECTION LENACAPAVIR (SUNLENCA) 463.5 MG/1.5ML SQ INJECTION    Inject 3 mLs (927 mg total) into the skin every 6 (six) months. Administer each injection subcutaneously at separate sites in the abdomen (more or equal to 2 inches from the navel).   TRAMADOL (ULTRAM) 50 MG TABLET    Take 1 tablet (50 mg total) by mouth every 6 (six) hours as needed for moderate pain or severe pain.  Modified Medications   No medications on file  Discontinued Medications   No medications on file    Allergies: Allergies  Allergen Reactions   Penicillins Rash    Has patient had a PCN reaction causing immediate rash, facial/tongue/throat swelling, SOB or lightheadedness with hypotension: No Has patient had a PCN reaction causing severe rash involving mucus membranes or skin necrosis: No Has patient had a PCN reaction that required hospitalization No Has patient had a PCN reaction occurring within the last 10 years: No If all of the above answers are "NO", then may proceed with Cephalosporin use.     Past Medical History: Past Medical History:  Diagnosis Date   Breast cancer ([place]) 02/2022   left breast IDC with DCIS   Family history of breast cancer 03/20/2022   Family history of pancreatic cancer 03/20/2022   HIV infection ([place]) 1993   Hypertension  Social History: Social History   Socioeconomic History   Marital status: Single    Spouse name: Not on file   Number of children: Not on file   Years of education: Not on file   Highest education level: Not on file  Occupational History   Not on file  Tobacco Use   Smoking status: Former    Packs/day: 0.10    Types: Cigarettes   Smokeless tobacco: Never  Vaping Use   Vaping Use: Never used  Substance and Sexual Activity   Alcohol use: No    Alcohol/week: 0.0 standard drinks of alcohol   Drug use: No   Sexual activity: Not Currently    Partners: Male    Birth  control/protection: Surgical    Comment: declined condoms  Other Topics Concern   Not on file  Social History Narrative   Not on file   Social Determinants of Health   Financial Resource Strain: Not on file  Food Insecurity: Not on file  Transportation Needs: Not on file  Physical Activity: Not on file  Stress: Not on file  Social Connections: Not on file    Labs: Lab Results  Component Value Date   HIV1RNAQUANT 2,880 (H) 03/21/2022   HIV1RNAQUANT 16,100 (H) 09/17/2021   HIV1RNAQUANT 317 (H) 06/13/2021   CD4TABS 302 (L) 06/13/2021   CD4TABS 278 (L) 11/01/2020   CD4TABS 360 (L) 05/01/2020    RPR and STI Lab Results  Component Value Date   LABRPR NON-REACTIVE 05/01/2020   LABRPR NON-REACTIVE 05/10/2018   LABRPR NON REAC 10/30/2015   LABRPR NON REAC 05/24/2015   LABRPR NON REAC 12/05/2013    STI Results GC CT  05/01/2020  2:25 PM Negative  Negative   03/04/2016 12:00 AM Negative  Negative   12/05/2013 12:00 AM  CT: Negative     Hepatitis B Lab Results  Component Value Date   HEPBSAB INDETER (A) 08/08/2009   HEPBSAG NEG 08/08/2009   HEPBCAB POS (A) 08/08/2009   Hepatitis C No results found for: "HEPCAB", "HCVRNAPCRQN" Hepatitis A Lab Results  Component Value Date   HAV POS (A) 08/08/2009   Lipids: Lab Results  Component Value Date   CHOL 203 (H) 01/04/2020   TRIG 133 01/04/2020   HDL 51 01/04/2020   CHOLHDL 4.0 01/04/2020   VLDL 37 (H) 10/30/2015   LDLCALC 127 (H) 01/04/2020    Current HIV Regimen: [name], Descovy  Assessment: 21 YOF presenting to [facility] clinic for HIV follow-up. Patient recently started on Descovy, Rukobia, and [place] combination therapy due to documented resistance patterns. Patient stated she tolerated her medications well and did not notice any side effects, however she does state she has trouble swallowing the medications as she has a mental block if she knows she is trying to swallow a pill. Counseled patient she  could attempt coating medications in honey or submerging them in apple sauce or pudding to help them go down easier. Pill boxes were filled in clinic for a 2 weeks supply. Will check viral load today as conducted in [facility] study to assess patient response to [facility] therapy with OBR.   Spoke with patient extensively regarding timing of next appointment and when she should get her flu and COVID-19 vaccines. Informed patient we do not currently have the COVID-19 vaccine available in clinic, but we would be able to administer the flu vaccine. After discussion with patient regarding all of her options, and after she spoke with Dr. Baxter Flattery, Veronica Robles agreed to receive  the flu vaccine in clinic today and to see the pharmacy clinic again in 2 weeks for another refill of her pillboxes and to have the COVID-19 vaccine administered.   Plan: - F/U HIV RNA  - Fluad quadrivalent vaccine administered in clinic in [place]  - Appointment 10/17 to refill pill boxes and receive COVID-19 booster  - Appointment with Dr. [place] Flattery 06/09/22  Adria Dill, PharmD PGY-2 Infectious Diseases Resident  04/29/2022 2:30 PM

## 2022-04-30 ENCOUNTER — Encounter: Payer: Self-pay | Admitting: Hematology and Oncology

## 2022-04-30 NOTE — Telephone Encounter (Signed)
Received VM from pt.  RN attempt x1 to return call.  No answer, LVM for pt to return call to the office.  °

## 2022-05-01 LAB — HIV-1 RNA QUANT-NO REFLEX-BLD
HIV 1 RNA Quant: 52 Copies/mL — ABNORMAL HIGH
HIV-1 RNA Quant, Log: 1.72 Log cps/mL — ABNORMAL HIGH

## 2022-05-02 ENCOUNTER — Encounter: Payer: Self-pay | Admitting: Family Medicine

## 2022-05-02 NOTE — Progress Notes (Signed)
Look! Amazing. Hopefully she will be <20 at next visit.

## 2022-05-04 NOTE — Progress Notes (Signed)
Patient Care Team: [name], MD as PCP - General (Family Medicine) Carlyle Basques, MD as PCP - Infectious Diseases (Infectious Diseases) Rockwell Germany, RN as Oncology Nurse Navigator Mauro Kaufmann, RN as Oncology Nurse Navigator  DIAGNOSIS:  Encounter Diagnosis  Name Primary?   Malignant neoplasm of upper-outer quadrant of left breast in female, estrogen receptor positive ([place])     SUMMARY OF ONCOLOGIC HISTORY: Oncology History  Malignant neoplasm of upper-outer quadrant of left breast in female, estrogen receptor positive ([place])  02/28/2022 Initial Diagnosis   Screening detected left breast mass at 3 o'clock position measuring 0.9 cm, 4 enlarged left axillary lymph nodes along with asymmetric ducts between mass and the nipple possibly DCIS; lymph node biopsy: Positive, breast biopsy: Grade 2 IDC ER 100%, PR 0%, HER2 negative, Ki-67 10%   03/10/2022 Cancer Staging   Staging form: Breast, AJCC 8th Edition - Clinical stage from 03/10/2022: Stage IIA (cT1b, cN1(f), cM0, G2, ER+, PR-, HER2-) - Signed by Hayden Pedro, PA-C on 03/10/2022 Stage prefix: Initial diagnosis Method of lymph node assessment: Core biopsy Histologic grading system: 3 grade system   03/27/2022 -  Chemotherapy   Patient is on Treatment Plan : BREAST ADJUVANT DOSE DENSE AC q14d / PACLitaxel q7d       CHIEF COMPLIANT: Cycle 4 AC  INTERVAL HISTORY: Veronica Robles is a 66 y.o. female is here because of recent diagnosis of left breast cancer. She presents to the clinic today for a follow-up.She states that she is doing good. She denies nausea. She reports that her energy is pretty good until she gets her injection she feels tired.  ALLERGIES:  is allergic to penicillins.  MEDICATIONS:  Current Outpatient Medications  Medication Sig Dispense Refill   Blood Pressure Monitoring (ADULT BLOOD PRESSURE CUFF LG) KIT       dexamethasone (DECADRON) 4 MG tablet Take 1 tablet by mouth the day after chemo  and 1 tablet 2 days after chemo; take with food 8 tablet 0   emtricitabine-tenofovir AF (DESCOVY) 200-25 MG tablet Take 1 tablet by mouth daily. 30 tablet 5   fostemsavir tromethamine (RUKOBIA) 600 MG TB12 ER tablet Take 1 tablet (600 mg total) by mouth 2 (two) times daily. 60 tablet 5   hydrochlorothiazide (HYDRODIURIL) 25 MG tablet Take 1 tablet (25 mg total) by mouth daily. 90 tablet 1   lenacapavir (SUNLENCA) 300 mg tablet Take 2 tablets by mouth See admin instructions. This is the 2-day initiation kit: Take two 300-mg tablets on Day 1 & Day 2 with or without food. 4 each 0   lidocaine-prilocaine (EMLA) cream Apply to affected area once 30 g 3   ondansetron (ZOFRAN) 8 MG tablet Take 1 tablet (8 mg) by mouth every 8 hours as needed for nausea/vomiting. Start third day after doxorubicin/cyclophosphamide chemotherapy. 30 tablet 1   prochlorperazine (COMPAZINE) 10 MG tablet Take 1 tablet (10 mg total) by mouth every 6 (six) hours as needed for nausea or vomiting. 30 tablet 1   SQ injection lenacapavir (SUNLENCA) 463.5 MG/1.5ML SQ injection Inject 3 mLs (927 mg total) into the skin every 6 (six) months. Administer each injection subcutaneously at separate sites in the abdomen (more or equal to 2 inches from the navel). 3 mL 2   traMADol (ULTRAM) 50 MG tablet Take 1 tablet (50 mg total) by mouth every 6 (six) hours as needed for moderate pain or severe pain. (Patient not taking: Reported on 04/24/2022) 20 tablet 0   No current  facility-administered medications for this visit.   Facility-Administered Medications Ordered in Other Visits  Medication Dose Route Frequency Provider Last Rate Last Admin   ciprofloxacin (CIPRO) IVPB 400 mg  400 mg Intravenous Q12H Kathie Rhodes, MD   400 mg at 03/26/22 0728    PHYSICAL EXAMINATION: ECOG PERFORMANCE STATUS: 1 - Symptomatic but completely ambulatory  Vitals:   05/08/22 1051  BP: 137/80  Pulse: 90  Resp: 18  Temp: (!) 97.5 F (36.4 C)  SpO2: 100%    Filed Weights   05/08/22 1051  Weight: 124 lb 3.2 oz (56.3 kg)      LABORATORY DATA:  I have reviewed the data as listed    Latest Ref Rng & Units 05/08/2022   10:36 AM 04/24/2022   10:22 AM 04/09/2022   11:28 AM  CMP  Glucose 70 - 99 mg/dL 111  164  154   BUN 8 - 23 mg/dL _0 Creatinine 0.44 - 1.00 mg/dL 0.75  0.75  0.86   Sodium 135 - 145 mmol/L 139  141  138   Potassium 3.5 - 5.1 mmol/L 3.9  3.4  3.7   Chloride 98 - 111 mmol/L 104  101  103   CO2 22 - 32 mmol/L _1 Calcium 8.9 - 10.3 mg/dL 9.2  9.2  9.6   Total Protein 6.5 - 8.1 g/dL 6.7  6.8  6.4   Total Bilirubin 0.3 - 1.2 mg/dL 0.2  0.2  0.2   Alkaline Phos 38 - 126 U/L 116  132  87   AST 15 - 41 U/L _2 ALT 0 - 44 U/L _3 Lab Results  Component Value Date   WBC 13.4 (H) 05/08/2022   HGB 9.2 (L) 05/08/2022   HCT 27.9 (L) 05/08/2022   MCV 89.4 05/08/2022   PLT 170 05/08/2022   NEUTROABS 9.8 (H) 05/08/2022    ASSESSMENT & PLAN:  Malignant neoplasm of upper-outer quadrant of left breast in female, estrogen receptor positive ([place]) 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; lymph node biopsy: Positive, breast biopsy: Grade 2 IDC ER 100%, PR 0%, HER2 negative, Ki-67 10%   Treatment plan: 1.  Neoadjuvant chemotherapy with dose dense Adriamycin and Cytoxan x4 followed by Taxol weekly x12 2. breast conserving surgery with targeted node dissection 3.  Adjuvant radiation 4.  Follow-up antiestrogen therapy with abemaciclib --------------------------------------------------------------------------------------------------------------------------------- Current treatment: Cycle 4 dose dense Adriamycin and Cytoxan   Chemotoxicities: 1.  Neutropenia: Reduce the dosage from cycle 2. 2. Fatigue 3.  Nausea: Fairly mild 4.  Chemo-induced  anemia: Hemoglobin today is 9.2: Monitoring   Return to clinic in 2 weeks for cycle 1 weekly Taxol    No orders of the defined types were placed in this encounter.  The patient has a good understanding of the overall plan. she agrees with it. she will call with any problems that may develop before the next visit here. Total time spent: 30 mins including face to face time and time spent for planning, charting and co-ordination of care   Harriette Ohara, MD 05/08/22    I Gardiner Coins am scribing for Dr. Lindi Adie  I have reviewed the above documentation for accuracy and completeness, and I agree with the above.

## 2022-05-07 MED FILL — Fosaprepitant Dimeglumine For IV Infusion 150 MG (Base Eq): INTRAVENOUS | Qty: 5 | Status: AC

## 2022-05-07 MED FILL — Dexamethasone Sodium Phosphate Inj 100 MG/10ML: INTRAMUSCULAR | Qty: 1 | Status: AC

## 2022-05-08 ENCOUNTER — Inpatient Hospital Stay: Payer: Medicare HMO

## 2022-05-08 ENCOUNTER — Inpatient Hospital Stay: Payer: Medicare HMO | Attending: Hematology and Oncology

## 2022-05-08 ENCOUNTER — Inpatient Hospital Stay: Payer: Medicare HMO | Admitting: Hematology and Oncology

## 2022-05-08 ENCOUNTER — Other Ambulatory Visit: Payer: Self-pay

## 2022-05-08 DIAGNOSIS — Z170 Estrogen receptor positive status [ER+]: Secondary | ICD-10-CM | POA: Diagnosis not present

## 2022-05-08 DIAGNOSIS — D6481 Anemia due to antineoplastic chemotherapy: Secondary | ICD-10-CM | POA: Diagnosis not present

## 2022-05-08 DIAGNOSIS — C50412 Malignant neoplasm of upper-outer quadrant of left female breast: Secondary | ICD-10-CM

## 2022-05-08 DIAGNOSIS — Z79624 Long term (current) use of inhibitors of nucleotide synthesis: Secondary | ICD-10-CM | POA: Diagnosis not present

## 2022-05-08 DIAGNOSIS — R110 Nausea: Secondary | ICD-10-CM | POA: Diagnosis not present

## 2022-05-08 DIAGNOSIS — Z79899 Other long term (current) drug therapy: Secondary | ICD-10-CM | POA: Diagnosis not present

## 2022-05-08 DIAGNOSIS — R5383 Other fatigue: Secondary | ICD-10-CM | POA: Diagnosis not present

## 2022-05-08 DIAGNOSIS — Z880 Allergy status to penicillin: Secondary | ICD-10-CM | POA: Insufficient documentation

## 2022-05-08 DIAGNOSIS — N6489 Other specified disorders of breast: Secondary | ICD-10-CM | POA: Insufficient documentation

## 2022-05-08 DIAGNOSIS — D701 Agranulocytosis secondary to cancer chemotherapy: Secondary | ICD-10-CM | POA: Diagnosis not present

## 2022-05-08 DIAGNOSIS — C773 Secondary and unspecified malignant neoplasm of axilla and upper limb lymph nodes: Secondary | ICD-10-CM | POA: Diagnosis not present

## 2022-05-08 DIAGNOSIS — T451X5A Adverse effect of antineoplastic and immunosuppressive drugs, initial encounter: Secondary | ICD-10-CM | POA: Diagnosis not present

## 2022-05-08 DIAGNOSIS — Z7952 Long term (current) use of systemic steroids: Secondary | ICD-10-CM | POA: Diagnosis not present

## 2022-05-08 DIAGNOSIS — Z5111 Encounter for antineoplastic chemotherapy: Secondary | ICD-10-CM | POA: Insufficient documentation

## 2022-05-08 LAB — CMP (CANCER CENTER ONLY)
ALT: 14 U/L (ref 0–44)
AST: 20 U/L (ref 15–41)
Albumin: 4 g/dL (ref 3.5–5.0)
Alkaline Phosphatase: 116 U/L (ref 38–126)
Anion gap: 5 (ref 5–15)
BUN: 11 mg/dL (ref 8–23)
CO2: 30 mmol/L (ref 22–32)
Calcium: 9.2 mg/dL (ref 8.9–10.3)
Chloride: 104 mmol/L (ref 98–111)
Creatinine: 0.75 mg/dL (ref 0.44–1.00)
GFR, Estimated: >60 mL/min (ref >60)
Glucose, Bld: 111 mg/dL — ABNORMAL HIGH (ref 70–99)
Potassium: 3.9 mmol/L (ref 3.5–5.1)
Sodium: 139 mmol/L (ref 135–145)
Total Bilirubin: 0.2 mg/dL — ABNORMAL LOW (ref 0.3–1.2)
Total Protein: 6.7 g/dL (ref 6.5–8.1)

## 2022-05-08 LAB — CBC WITH DIFFERENTIAL (CANCER CENTER ONLY)
Abs Immature Granulocytes: 1.21 10*3/uL — ABNORMAL HIGH (ref 0.00–0.07)
Basophils Absolute: 0.1 10*3/uL (ref 0.0–0.1)
Basophils Relative: 0 %
Eosinophils Absolute: 0 10*3/uL (ref 0.0–0.5)
Eosinophils Relative: 0 %
HCT: 27.9 % — ABNORMAL LOW (ref 36.0–46.0)
Hemoglobin: 9.2 g/dL — ABNORMAL LOW (ref 12.0–15.0)
Immature Granulocytes: 9 %
Lymphocytes Relative: 5 %
Lymphs Abs: 0.7 10*3/uL (ref 0.7–4.0)
MCH: 29.5 pg (ref 26.0–34.0)
MCHC: 33 g/dL (ref 30.0–36.0)
MCV: 89.4 fL (ref 80.0–100.0)
Monocytes Absolute: 1.7 10*3/uL — ABNORMAL HIGH (ref 0.1–1.0)
Monocytes Relative: 13 %
Neutro Abs: 9.8 10*3/uL — ABNORMAL HIGH (ref 1.7–7.7)
Neutrophils Relative %: 73 %
Platelet Count: 170 10*3/uL (ref 150–400)
RBC: 3.12 MIL/uL — ABNORMAL LOW (ref 3.87–5.11)
RDW: 17.2 % — ABNORMAL HIGH (ref 11.5–15.5)
WBC Count: 13.4 10*3/uL — ABNORMAL HIGH (ref 4.0–10.5)
nRBC: 0.2 % (ref 0.0–0.2)

## 2022-05-08 MED ORDER — SODIUM CHLORIDE 0.9% FLUSH
10.0000 mL | INTRAVENOUS | Status: DC | PRN
  Administered 2022-05-08: 10 mL

## 2022-05-08 MED ORDER — SODIUM CHLORIDE 0.9% FLUSH
10.0000 mL | INTRAVENOUS | Status: DC | PRN
  Administered 2022-05-08: 10 mL via INTRAVENOUS

## 2022-05-08 MED ORDER — SODIUM CHLORIDE 0.9 % IV SOLN
400.0000 mg/m2 | Freq: Once | INTRAVENOUS | Status: AC
  Administered 2022-05-08: 640 mg via INTRAVENOUS
  Filled 2022-05-08: qty 32

## 2022-05-08 MED ORDER — DOXORUBICIN HCL CHEMO IV INJECTION 2 MG/ML
40.0000 mg/m2 | Freq: Once | INTRAVENOUS | Status: AC
  Administered 2022-05-08: 64 mg via INTRAVENOUS
  Filled 2022-05-08: qty 32

## 2022-05-08 MED ORDER — PALONOSETRON HCL INJECTION 0.25 MG/5ML
0.2500 mg | Freq: Once | INTRAVENOUS | Status: AC
  Administered 2022-05-08: 0.25 mg via INTRAVENOUS
  Filled 2022-05-08: qty 5

## 2022-05-08 MED ORDER — SODIUM CHLORIDE 0.9 % IV SOLN
10.0000 mg | Freq: Once | INTRAVENOUS | Status: AC
  Administered 2022-05-08: 10 mg via INTRAVENOUS
  Filled 2022-05-08: qty 10

## 2022-05-08 MED ORDER — SODIUM CHLORIDE 0.9 % IV SOLN
150.0000 mg | Freq: Once | INTRAVENOUS | Status: AC
  Administered 2022-05-08: 150 mg via INTRAVENOUS
  Filled 2022-05-08: qty 150

## 2022-05-08 MED ORDER — HEPARIN SOD (PORK) LOCK FLUSH 100 UNIT/ML IV SOLN
500.0000 [IU] | Freq: Once | INTRAVENOUS | Status: AC | PRN
  Administered 2022-05-08: 500 [IU]

## 2022-05-08 MED ORDER — SODIUM CHLORIDE 0.9 % IV SOLN: Freq: Once | INTRAVENOUS | Status: AC

## 2022-05-08 NOTE — Assessment & Plan Note (Signed)
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; lymph node biopsy: Positive, breast biopsy: Grade 2 IDC ER 100%, PR 0%, HER2 negative, Ki-67 10%  Treatment plan: 1.Neoadjuvant chemotherapy with dose dense Adriamycin and Cytoxan x4 followed by Taxol weekly x12 2.breast conserving surgery with targeted node dissection 3.Adjuvant radiation 4.Follow-up antiestrogen therapy with abemaciclib --------------------------------------------------------------------------------------------------------------------------------- Current treatment: Cycle 4 dose dense Adriamycin and Cytoxan  Chemotoxicities: 1.  Neutropenia: Reduce the dosage from cycle 2. 2. Fatigue 3.  Nausea: Fairly mild 4.  Chemo-induced anemia: Hemoglobin today is 10.1: Monitoring  Return to clinic in 2 weeks for cycle 1 weekly Taxol

## 2022-05-08 NOTE — Patient Instructions (Signed)
[facility]ICAL [facility]COLOGY  Discharge Instructions: Thank you for choosing [facility] to provide your oncology and hematology care.   If you have a lab appointment with the Cancer Center, please go directly to the Cancer Center and check in at the registration area.   Wear comfortable clothing and clothing appropriate for easy access to any Portacath or PICC line.   We strive to give you quality time with your provider. You may need to reschedule your appointment if you arrive late (15 or more minutes).  Arriving late affects you and other patients whose appointments are after yours.  Also, if you miss three or more appointments without notifying the office, you may be dismissed from the clinic at the provider's discretion.      For prescription refill requests, have your pharmacy contact our office and allow 72 hours for refills to be completed.    Today you received the following chemotherapy and/or immunotherapy agents: Adriamycin/Cytoxan      To help prevent nausea and vomiting after your treatment, we encourage you to take your nausea medication as directed.  BELOW ARE SYMPTOMS THAT SHOULD BE REPORTED IMMEDIATELY: *FEVER GREATER THAN 100.4 F (38 C) OR HIGHER *CHILLS OR SWEATING *NAUSEA AND VOMITING THAT IS NOT CONTROLLED WITH YOUR NAUSEA MEDICATION *UNUSUAL SHORTNESS OF BREATH *UNUSUAL BRUISING OR BLEEDING *URINARY PROBLEMS (pain or burning when urinating, or frequent urination) *BOWEL PROBLEMS (unusual diarrhea, constipation, pain near the anus) TENDERNESS IN MOUTH AND THROAT WITH OR WITHOUT PRESENCE OF ULCERS (sore throat, sores in mouth, or a toothache) UNUSUAL RASH, SWELLING OR PAIN  UNUSUAL VAGINAL DISCHARGE OR ITCHING   Items with * indicate a potential emergency and should be followed up as soon as possible or go to the Emergency Department if any problems should occur.  Please show the CHEMOTHERAPY ALERT CARD or IMMUNOTHERAPY ALERT CARD at  check-in to the Emergency Department and triage nurse.  Should you have questions after your visit or need to cancel or reschedule your appointment, please contact [facility]  Dept: [phone]  and follow the prompts.  Office hours are 8:00 a.m. to 4:30 p.m. Monday - Friday. Please note that voicemails left after 4:00 p.m. may not be returned until the following business day.  We are closed weekends and major holidays. You have access to a nurse at all times for urgent questions. Please call the main number to the clinic Dept: [phone] and follow the prompts.   For any non-urgent questions, you may also contact your provider using MyChart. We now offer e-Visits for anyone 18 and older to request care online for non-urgent symptoms. For details visit [email or web address].   Also download the MyChart app! Go to the app store, search "MyChart", open the app, select [facility], and log in with your MyChart username and password.  Masks are optional in the cancer centers. If you would like for your care team to wear a mask while they are taking care of you, please let them know. You may have one support person who is at least 66 years old accompany you for your appointments. 

## 2022-05-09 ENCOUNTER — Encounter: Payer: Self-pay | Admitting: *Deleted

## 2022-05-09 ENCOUNTER — Other Ambulatory Visit (HOSPITAL_COMMUNITY): Payer: Self-pay

## 2022-05-10 ENCOUNTER — Inpatient Hospital Stay: Payer: Medicare HMO

## 2022-05-10 VITALS — BP 149/66 | HR 85 | Temp 97.8°F | Resp 16

## 2022-05-10 DIAGNOSIS — C50412 Malignant neoplasm of upper-outer quadrant of left female breast: Secondary | ICD-10-CM

## 2022-05-10 DIAGNOSIS — Z5111 Encounter for antineoplastic chemotherapy: Secondary | ICD-10-CM | POA: Diagnosis not present

## 2022-05-10 MED ORDER — PEGFILGRASTIM-CBQV 6 MG/0.6ML ~~LOC~~ SOSY
6.0000 mg | PREFILLED_SYRINGE | Freq: Once | SUBCUTANEOUS | Status: AC
  Administered 2022-05-10: 6 mg via SUBCUTANEOUS

## 2022-05-10 NOTE — Patient Instructions (Signed)

## 2022-05-12 ENCOUNTER — Telehealth: Payer: Self-pay

## 2022-05-12 NOTE — Telephone Encounter (Signed)
[facility] Patient Advocate Encounter  Patient's medications have been couriered to [facility] from [place] and will be picked up 05/13/22.  [place] , [place] [facility] Phone: [phone] Fax:  [phone]

## 2022-05-13 ENCOUNTER — Other Ambulatory Visit: Payer: Self-pay | Admitting: Pharmacist

## 2022-05-13 ENCOUNTER — Ambulatory Visit (INDEPENDENT_AMBULATORY_CARE_PROVIDER_SITE_OTHER): Payer: Medicare HMO | Admitting: Pharmacist

## 2022-05-13 ENCOUNTER — Other Ambulatory Visit: Payer: Self-pay

## 2022-05-13 ENCOUNTER — Ambulatory Visit (INDEPENDENT_AMBULATORY_CARE_PROVIDER_SITE_OTHER): Payer: Medicare HMO

## 2022-05-13 DIAGNOSIS — B20 Human immunodeficiency virus [HIV] disease: Secondary | ICD-10-CM | POA: Diagnosis not present

## 2022-05-13 DIAGNOSIS — Z23 Encounter for immunization: Secondary | ICD-10-CM

## 2022-05-13 MED ORDER — DESCOVY 200-25 MG PO TABS: 1.0000 | ORAL_TABLET | Freq: Every day | ORAL | 5 refills | Status: DC

## 2022-05-13 MED ORDER — RUKOBIA 600 MG PO TB12: 600.0000 mg | ORAL_TABLET | Freq: Two times a day (BID) | ORAL | 5 refills | Status: DC

## 2022-05-13 NOTE — Progress Notes (Signed)
05/13/2022  HPI: Veronica Robles is a 66 y.o. female who presents to the [facility] clinic for HIV follow-up.  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   FOSTEMSAVIR TROMETHAMINE (RUKOBIA) 600 MG TB12 ER TABLET    Take 1 tablet (600 mg total) by mouth 2 (two) times daily.   HYDROCHLOROTHIAZIDE (HYDRODIURIL) 25 MG TABLET    Take 1 tablet (25 mg total) by mouth daily.   LENACAPAVIR (SUNLENCA) 300 MG TABLET    Take 2 tablets by mouth See admin instructions. This is the 2-day initiation kit: Take two 300-mg tablets on Day 1 & Day 2 with or without food.   LIDOCAINE-PRILOCAINE (EMLA) CREAM    Apply to affected area once   ONDANSETRON (ZOFRAN) 8 MG TABLET    Take 1 tablet (8 mg) by mouth every 8 hours as needed for nausea/vomiting. Start third day after doxorubicin/cyclophosphamide chemotherapy.   PROCHLORPERAZINE (COMPAZINE) 10 MG TABLET    Take 1 tablet (10  mg total) by mouth every 6 (six) hours as needed for nausea or vomiting.   SQ INJECTION LENACAPAVIR (SUNLENCA) 463.5 MG/1.5ML SQ INJECTION    Inject 3 mLs (927 mg total) into the skin every 6 (six) months. Administer each injection subcutaneously at separate sites in the abdomen (more or equal to 2 inches from the navel).   TRAMADOL (ULTRAM) 50 MG TABLET    Take 1 tablet (50 mg total) by mouth every 6 (six) hours as needed for moderate pain or severe pain.  Modified Medications   No medications on file  Discontinued Medications   No medications on file    Allergies: Allergies  Allergen Reactions   Penicillins Rash    Has patient had a PCN reaction causing immediate rash, facial/tongue/throat swelling, SOB or lightheadedness with hypotension: No Has patient had a PCN reaction causing severe rash involving mucus membranes or skin necrosis: No Has patient had a PCN reaction that required hospitalization No Has patient had a PCN reaction occurring within the last 10 years: No If all of the above answers are "NO", then may proceed with Cephalosporin use.     Past Medical History: Past Medical History:  Diagnosis Date   Breast cancer ([place]) 02/2022   left breast IDC with DCIS   Family history of breast cancer 03/20/2022   Family history of pancreatic cancer 03/20/2022   HIV infection ([place]) 1993   Hypertension  Social History: Social History   Socioeconomic History   Marital status: Single    Spouse name: Not on file   Number of children: Not on file   Years of education: Not on file   Highest education level: Not on file  Occupational History   Not on file  Tobacco Use   Smoking status: Former    Packs/day: 0.10    Types: Cigarettes   Smokeless tobacco: Never  Vaping Use   Vaping Use: Never used  Substance and Sexual Activity   Alcohol use: No    Alcohol/week: 0.0 standard drinks of alcohol   Drug use: No   Sexual activity: Not Currently    Partners: Male    Birth  control/protection: Surgical    Comment: declined condoms  Other Topics Concern   Not on file  Social History Narrative   Not on file   Social Determinants of Health   Financial Resource Strain: Not on file  Food Insecurity: Not on file  Transportation Needs: Not on file  Physical Activity: Not on file  Stress: Not on file  Social Connections: Not on file    Labs: Lab Results  Component Value Date   HIV1RNAQUANT 52 (H) 04/29/2022   HIV1RNAQUANT 2,880 (H) 03/21/2022   HIV1RNAQUANT 16,100 (H) 09/17/2021   CD4TABS 302 (L) 06/13/2021   CD4TABS 278 (L) 11/01/2020   CD4TABS 360 (L) 05/01/2020    RPR and STI Lab Results  Component Value Date   LABRPR NON-REACTIVE 05/01/2020   LABRPR NON-REACTIVE 05/10/2018   LABRPR NON REAC 10/30/2015   LABRPR NON REAC 05/24/2015   LABRPR NON REAC 12/05/2013    STI Results GC CT  05/01/2020  2:25 PM Negative  Negative   03/04/2016 12:00 AM Negative  Negative   12/05/2013 12:00 AM  CT: Negative     Hepatitis B Lab Results  Component Value Date   HEPBSAB INDETER (A) 08/08/2009   HEPBSAG NEG 08/08/2009   HEPBCAB POS (A) 08/08/2009   Hepatitis C No results found for: "HEPCAB", "HCVRNAPCRQN" Hepatitis A Lab Results  Component Value Date   HAV POS (A) 08/08/2009   Lipids: Lab Results  Component Value Date   CHOL 203 (H) 01/04/2020   TRIG 133 01/04/2020   HDL 51 01/04/2020   CHOLHDL 4.0 01/04/2020   VLDL 37 (H) 10/30/2015   LDLCALC 127 (H) 01/04/2020    Current HIV Regimen: [name], Descovy   Assessment: 29 YOF presenting to [facility] clinic for HIV follow-up. Patient recently started on Descovy, Rukobia, and [place] combination therapy due to documented resistance patterns. Patient stated she tolerated her medications well and did not notice any side effects, however she does state she has trouble swallowing the medications as she has a mental block if she knows she is trying to swallow a pill. Counseled patient she  could attempt coating medications in honey or submerging them in apple sauce or pudding to help them go down easier.    Discussed Veronica Robles's response to her new HIV regimen today in clinic and how her viral load decreased dramatically after just 2 weeks of therapy. She was understandably excited with her response to treatment.Will check viral load today to assess continued efficacy of new regimen.  Veronica Robles had her pill boxes filled again today in clinic for a 2 weeks supply. She has elected to start filling her pill boxes at home and would like her medications filled at [facility]. Counseled her to pay close attention when filling her pill  boxes to make sure she is taking her medications on the correct schedule.   Plan: COVID vaccine (Comirnaty) administered IM x1 in RD  Next appointment 06/09/22 with Dr. Baxter Flattery  F/U HIV RNA   Adria Dill, PharmD PGY-2 Infectious Diseases Resident  05/13/2022 3:20 PM

## 2022-05-17 LAB — HIV-1 RNA QUANT-NO REFLEX-BLD
HIV 1 RNA Quant: <20 Copies/mL — ABNORMAL HIGH
HIV-1 RNA Quant, Log: <1.3 Log cps/mL — ABNORMAL HIGH

## 2022-05-19 NOTE — Progress Notes (Signed)
Look! She's undetectable!   Dr. 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

## 2022-05-21 MED FILL — Dexamethasone Sodium Phosphate Inj 100 MG/10ML: INTRAMUSCULAR | Qty: 1 | Status: AC

## 2022-05-21 NOTE — Progress Notes (Signed)
Patient Care Team: [name], MD as PCP - General (Family Medicine) Carlyle Basques, MD as PCP - Infectious Diseases (Infectious Diseases) Rockwell Germany, RN as Oncology Nurse Navigator Mauro Kaufmann, RN as Oncology Nurse Navigator  DIAGNOSIS: No diagnosis found.  SUMMARY OF ONCOLOGIC HISTORY: Oncology History  Malignant neoplasm of upper-outer quadrant of left breast in female, estrogen receptor positive ([place])  02/28/2022 Initial Diagnosis   Screening detected left breast mass at 3 o'clock position measuring 0.9 cm, 4 enlarged left axillary lymph nodes along with asymmetric ducts between mass and the nipple possibly DCIS; lymph node biopsy: Positive, breast biopsy: Grade 2 IDC ER 100%, PR 0%, HER2 negative, Ki-67 10%   03/10/2022 Cancer Staging   Staging form: Breast, AJCC 8th Edition - Clinical stage from 03/10/2022: Stage IIA (cT1b, cN1(f), cM0, G2, ER+, PR-, HER2-) - Signed by Hayden Pedro, PA-C on 03/10/2022 Stage prefix: Initial diagnosis Method of lymph node assessment: Core biopsy Histologic grading system: 3 grade system   03/27/2022 -  Chemotherapy   Patient is on Treatment Plan : BREAST ADJUVANT DOSE DENSE AC q14d / PACLitaxel q7d       CHIEF COMPLIANT: Follow-up cycle 1 Taxol  INTERVAL HISTORY: Veronica Robles is a 66 y.o. female is here because of recent diagnosis of left breast cancer. She presents to the clinic today for treatment and a follow-up.   ALLERGIES:  is allergic to penicillins.  MEDICATIONS:  Current Outpatient Medications  Medication Sig Dispense Refill   Blood Pressure Monitoring (ADULT BLOOD PRESSURE CUFF LG) KIT       dexamethasone (DECADRON) 4 MG tablet Take 1 tablet by mouth the day after chemo and 1 tablet 2 days after chemo; take with food 8 tablet 0   emtricitabine-tenofovir AF (DESCOVY) 200-25 MG tablet Take 1 tablet by mouth daily. 30 tablet 5   fostemsavir tromethamine (RUKOBIA) 600 MG TB12 ER tablet Take 1 tablet (600 mg  total) by mouth 2 (two) times daily. 60 tablet 5   hydrochlorothiazide (HYDRODIURIL) 25 MG tablet Take 1 tablet (25 mg total) by mouth daily. 90 tablet 1   lenacapavir (SUNLENCA) 300 mg tablet Take 2 tablets by mouth See admin instructions. This is the 2-day initiation kit: Take two 300-mg tablets on Day 1 & Day 2 with or without food. 4 each 0   lidocaine-prilocaine (EMLA) cream Apply to affected area once 30 g 3   ondansetron (ZOFRAN) 8 MG tablet Take 1 tablet (8 mg) by mouth every 8 hours as needed for nausea/vomiting. Start third day after doxorubicin/cyclophosphamide chemotherapy. 30 tablet 1   prochlorperazine (COMPAZINE) 10 MG tablet Take 1 tablet (10 mg total) by mouth every 6 (six) hours as needed for nausea or vomiting. 30 tablet 1   SQ injection lenacapavir (SUNLENCA) 463.5 MG/1.5ML SQ injection Inject 3 mLs (927 mg total) into the skin every 6 (six) months. Administer each injection subcutaneously at separate sites in the abdomen (more or equal to 2 inches from the navel). 3 mL 2   traMADol (ULTRAM) 50 MG tablet Take 1 tablet (50 mg total) by mouth every 6 (six) hours as needed for moderate pain or severe pain. (Patient not taking: Reported on 04/24/2022) 20 tablet 0   No current facility-administered medications for this visit.   Facility-Administered Medications Ordered in Other Visits  Medication Dose Route Frequency Provider Last Rate Last Admin   ciprofloxacin (CIPRO) IVPB 400 mg  400 mg Intravenous Q12H Kathie Rhodes, MD   400 mg at  03/26/22 0728    PHYSICAL EXAMINATION: ECOG PERFORMANCE STATUS: {CHL ONC ECOG PS:[id number]}  There were no vitals filed for this visit. There were no vitals filed for this visit.  BREAST:*** No palpable masses or nodules in either right or left breasts. No palpable axillary supraclavicular or infraclavicular adenopathy no breast tenderness or nipple discharge. (exam performed in the presence of a chaperone)  LABORATORY DATA:  I have reviewed the  data as listed    Latest Ref Rng & Units 05/08/2022   10:36 AM 04/24/2022   10:22 AM 04/09/2022   11:28 AM  CMP  Glucose 70 - 99 mg/dL 111  164  154   BUN 8 - 23 mg/dL '11  9  5   ' Creatinine 0.44 - 1.00 mg/dL 0.75  0.75  0.86   Sodium 135 - 145 mmol/L 139  141  138   Potassium 3.5 - 5.1 mmol/L 3.9  3.4  3.7   Chloride 98 - 111 mmol/L 104  101  103   CO2 22 - 32 mmol/L '30  31  30   ' Calcium 8.9 - 10.3 mg/dL 9.2  9.2  9.6   Total Protein 6.5 - 8.1 g/dL 6.7  6.8  6.4   Total Bilirubin 0.3 - 1.2 mg/dL 0.2  0.2  0.2   Alkaline Phos 38 - 126 U/L 116  132  87   AST 15 - 41 U/L '20  20  19   ' ALT 0 - 44 U/L '14  16  16     ' Lab Results  Component Value Date   WBC 13.4 (H) 05/08/2022   HGB 9.2 (L) 05/08/2022   HCT 27.9 (L) 05/08/2022   MCV 89.4 05/08/2022   PLT 170 05/08/2022   NEUTROABS 9.8 (H) 05/08/2022    ASSESSMENT & PLAN:  No problem-specific Assessment & Plan notes found for this encounter.    No orders of the defined types were placed in this encounter.  The patient has a good understanding of the overall plan. she agrees with it. she will call with any problems that may develop before the next visit here. Total time spent: 30 mins including face to face time and time spent for planning, charting and co-ordination of care   Suzzette Righter, [place] 05/21/22    I Gardiner Coins am scribing for Dr. Lindi Adie  ***

## 2022-05-22 ENCOUNTER — Other Ambulatory Visit: Payer: Self-pay

## 2022-05-22 ENCOUNTER — Inpatient Hospital Stay: Payer: Medicare HMO

## 2022-05-22 ENCOUNTER — Inpatient Hospital Stay: Payer: Medicare HMO | Admitting: Hematology and Oncology

## 2022-05-22 VITALS — BP 113/65 | HR 87 | Temp 98.6°F | Resp 18

## 2022-05-22 DIAGNOSIS — Z170 Estrogen receptor positive status [ER+]: Secondary | ICD-10-CM | POA: Diagnosis not present

## 2022-05-22 DIAGNOSIS — C50412 Malignant neoplasm of upper-outer quadrant of left female breast: Secondary | ICD-10-CM | POA: Diagnosis not present

## 2022-05-22 DIAGNOSIS — Z5111 Encounter for antineoplastic chemotherapy: Secondary | ICD-10-CM | POA: Diagnosis not present

## 2022-05-22 LAB — CBC WITH DIFFERENTIAL (CANCER CENTER ONLY)
Abs Immature Granulocytes: 0.49 10*3/uL — ABNORMAL HIGH (ref 0.00–0.07)
Basophils Absolute: 0.1 10*3/uL (ref 0.0–0.1)
Basophils Relative: 1 %
Eosinophils Absolute: 0 10*3/uL (ref 0.0–0.5)
Eosinophils Relative: 0 %
HCT: 28.2 % — ABNORMAL LOW (ref 36.0–46.0)
Hemoglobin: 9.3 g/dL — ABNORMAL LOW (ref 12.0–15.0)
Immature Granulocytes: 5 %
Lymphocytes Relative: 7 %
Lymphs Abs: 0.6 10*3/uL — ABNORMAL LOW (ref 0.7–4.0)
MCH: 29.8 pg (ref 26.0–34.0)
MCHC: 33 g/dL (ref 30.0–36.0)
MCV: 90.4 fL (ref 80.0–100.0)
Monocytes Absolute: 1.2 10*3/uL — ABNORMAL HIGH (ref 0.1–1.0)
Monocytes Relative: 13 %
Neutro Abs: 6.6 10*3/uL (ref 1.7–7.7)
Neutrophils Relative %: 74 %
Platelet Count: 129 10*3/uL — ABNORMAL LOW (ref 150–400)
RBC: 3.12 MIL/uL — ABNORMAL LOW (ref 3.87–5.11)
RDW: 18 % — ABNORMAL HIGH (ref 11.5–15.5)
WBC Count: 9.1 10*3/uL (ref 4.0–10.5)
nRBC: 0 % (ref 0.0–0.2)

## 2022-05-22 LAB — CMP (CANCER CENTER ONLY)
ALT: 16 U/L (ref 0–44)
AST: 22 U/L (ref 15–41)
Albumin: 4 g/dL (ref 3.5–5.0)
Alkaline Phosphatase: 115 U/L (ref 38–126)
Anion gap: 7 (ref 5–15)
BUN: 7 mg/dL — ABNORMAL LOW (ref 8–23)
CO2: 29 mmol/L (ref 22–32)
Calcium: 9.7 mg/dL (ref 8.9–10.3)
Chloride: 104 mmol/L (ref 98–111)
Creatinine: 0.91 mg/dL (ref 0.44–1.00)
GFR, Estimated: >60 mL/min (ref >60)
Glucose, Bld: 132 mg/dL — ABNORMAL HIGH (ref 70–99)
Potassium: 3.7 mmol/L (ref 3.5–5.1)
Sodium: 140 mmol/L (ref 135–145)
Total Bilirubin: 0.3 mg/dL (ref 0.3–1.2)
Total Protein: 6.5 g/dL (ref 6.5–8.1)

## 2022-05-22 MED ORDER — SODIUM CHLORIDE 0.9% FLUSH
10.0000 mL | INTRAVENOUS | Status: DC | PRN
  Administered 2022-05-22: 10 mL

## 2022-05-22 MED ORDER — SODIUM CHLORIDE 0.9 % IV SOLN
10.0000 mg | Freq: Once | INTRAVENOUS | Status: AC
  Administered 2022-05-22: 10 mg via INTRAVENOUS
  Filled 2022-05-22: qty 10

## 2022-05-22 MED ORDER — SODIUM CHLORIDE 0.9 % IV SOLN: Freq: Once | INTRAVENOUS | Status: AC

## 2022-05-22 MED ORDER — HEPARIN SOD (PORK) LOCK FLUSH 100 UNIT/ML IV SOLN
500.0000 [IU] | Freq: Once | INTRAVENOUS | Status: AC | PRN
  Administered 2022-05-22: 500 [IU]

## 2022-05-22 MED ORDER — FAMOTIDINE IN NACL 20-0.9 MG/50ML-% IV SOLN
20.0000 mg | Freq: Once | INTRAVENOUS | Status: AC
  Administered 2022-05-22: 20 mg via INTRAVENOUS
  Filled 2022-05-22: qty 50

## 2022-05-22 MED ORDER — SODIUM CHLORIDE 0.9 % IV SOLN
80.0000 mg/m2 | Freq: Once | INTRAVENOUS | Status: AC
  Administered 2022-05-22: 126 mg via INTRAVENOUS
  Filled 2022-05-22: qty 21

## 2022-05-22 MED ORDER — CETIRIZINE HCL 10 MG/ML IV SOLN
10.0000 mg | Freq: Once | INTRAVENOUS | Status: AC
  Administered 2022-05-22: 10 mg via INTRAVENOUS
  Filled 2022-05-22: qty 1

## 2022-05-22 NOTE — Patient Instructions (Signed)
[facility] ONCOLOGY  Discharge Instructions: Thank you for choosing [facility] to provide your oncology and hematology care.   If you have a lab appointment with the [facility], please go directly to the [facility] and check in at the registration area.   Wear comfortable clothing and clothing appropriate for easy access to any Portacath or PICC line.   We strive to give you quality time with your provider. You may need to reschedule your appointment if you arrive late (15 or more minutes).  Arriving late affects you and other patients whose appointments are after yours.  Also, if you miss three or more appointments without notifying the office, you may be dismissed from the clinic at the provider's discretion.      For prescription refill requests, have your pharmacy contact our office and allow 72 hours for refills to be completed.    Today you received the following chemotherapy and/or immunotherapy agents: Paclitaxel      To help prevent nausea and vomiting after your treatment, we encourage you to take your nausea medication as directed.  BELOW ARE SYMPTOMS THAT SHOULD BE REPORTED IMMEDIATELY: *FEVER GREATER THAN 100.4 F (38 C) OR HIGHER *CHILLS OR SWEATING *NAUSEA AND VOMITING THAT IS NOT CONTROLLED WITH YOUR NAUSEA MEDICATION *UNUSUAL SHORTNESS OF BREATH *UNUSUAL BRUISING OR BLEEDING *URINARY PROBLEMS (pain or burning when urinating, or frequent urination) *BOWEL PROBLEMS (unusual diarrhea, constipation, pain near the anus) TENDERNESS IN MOUTH AND THROAT WITH OR WITHOUT PRESENCE OF ULCERS (sore throat, sores in mouth, or a toothache) UNUSUAL RASH, SWELLING OR PAIN  UNUSUAL VAGINAL DISCHARGE OR ITCHING   Items with * indicate a potential emergency and should be followed up as soon as possible or go to the Emergency Department if any problems should occur.  Please show the CHEMOTHERAPY ALERT CARD or IMMUNOTHERAPY ALERT CARD at check-in to  the Emergency Department and triage nurse.  Should you have questions after your visit or need to cancel or reschedule your appointment, please contact [facility]  Dept: [phone]  and follow the prompts.  Office hours are 8:00 a.m. to 4:30 p.m. Monday - Friday. Please note that voicemails left after 4:00 p.m. may not be returned until the following business day.  We are closed weekends and major holidays. You have access to a nurse at all times for urgent questions. Please call the main number to the clinic Dept: [phone] and follow the prompts.   For any non-urgent questions, you may also contact your provider using MyChart. We now offer e-Visits for anyone 30 and older to request care online for non-urgent symptoms. For details visit [email or web address].   Also download the MyChart app! Go to the app store, search "MyChart", open the app, select [facility], and log in with your MyChart username and password.  Masks are optional in the cancer centers. If you would like for your care team to wear a mask while they are taking care of you, please let them know. You may have one support person who is at least 66 years old accompany you for your appointments. Paclitaxel Injection What is this medication? PACLITAXEL (PAK li TAX el) treats some types of cancer. It works by slowing down the growth of cancer cells. This medicine may be used for other purposes; ask your health care provider or pharmacist if you have questions. COMMON BRAND NAME(S): Onxol, Taxol What should I tell my care team before I take this medication?  They need to know if you have any of these conditions: Heart disease Liver disease Low white blood cell levels An unusual or allergic reaction to paclitaxel, other medications, foods, dyes, or preservatives If you or your partner are pregnant or trying to get pregnant Breast-feeding How should I use this medication? This medication is  injected into a vein. It is given by your care team in a hospital or clinic setting. Talk to your care team about the use of this medication in children. While it may be given to children for selected conditions, precautions do apply. Overdosage: If you think you have taken too much of this medicine contact a poison control center or emergency room at once. NOTE: This medicine is only for you. Do not share this medicine with others. What if I miss a dose? Keep appointments for follow-up doses. It is important not to miss your dose. Call your care team if you are unable to keep an appointment. What may interact with this medication? Do not take this medication with any of the following: Live virus vaccines Other medications may affect the way this medication works. Talk with your care team about all of the medications you take. They may suggest changes to your treatment plan to lower the risk of side effects and to make sure your medications work as intended. This list may not describe all possible interactions. Give your health care provider a list of all the medicines, herbs, non-prescription drugs, or dietary supplements you use. Also tell them if you smoke, drink alcohol, or use illegal drugs. Some items may interact with your medicine. What should I watch for while using this medication? Your condition will be monitored carefully while you are receiving this medication. You may need blood work while taking this medication. This medication may make you feel generally unwell. This is not uncommon as chemotherapy can affect healthy cells as well as cancer cells. Report any side effects. Continue your course of treatment even though you feel ill unless your care team tells you to stop. This medication can cause serious allergic reactions. To reduce the risk, your care team may give you other medications to take before receiving this one. Be sure to follow the directions from your care team. This  medication may increase your risk of getting an infection. Call your care team for advice if you get a fever, chills, sore throat, or other symptoms of a cold or flu. Do not treat yourself. Try to avoid being around people who are sick. This medication may increase your risk to bruise or bleed. Call your care team if you notice any unusual bleeding. Be careful brushing or flossing your teeth or using a toothpick because you may get an infection or bleed more easily. If you have any dental work done, tell your dentist you are receiving this medication. Talk to your care team if you may be pregnant. Serious birth defects can occur if you take this medication during pregnancy. Talk to your care team before breastfeeding. Changes to your treatment plan may be needed. What side effects may I notice from receiving this medication? Side effects that you should report to your care team as soon as possible: Allergic reactions--skin rash, itching, hives, swelling of the face, lips, tongue, or throat Heart rhythm changes--fast or irregular heartbeat, dizziness, feeling faint or lightheaded, chest pain, trouble breathing Increase in blood pressure Infection--fever, chills, cough, sore throat, wounds that don't heal, pain or trouble when passing urine, general feeling of discomfort  or being unwell Low blood pressure--dizziness, feeling faint or lightheaded, blurry vision Low red blood cell level--unusual weakness or fatigue, dizziness, headache, trouble breathing Painful swelling, warmth, or redness of the skin, blisters or sores at the infusion site Pain, tingling, or numbness in the hands or feet Slow heartbeat--dizziness, feeling faint or lightheaded, confusion, trouble breathing, unusual weakness or fatigue Unusual bruising or bleeding Side effects that usually do not require medical attention (report to your care team if they continue or are bothersome): Diarrhea Hair loss Joint pain Loss of  appetite Muscle pain Nausea Vomiting This list may not describe all possible side effects. Call your doctor for medical advice about side effects. You may report side effects to FDA at 1-800-FDA-1088. Where should I keep my medication? This medication is given in a hospital or clinic. It will not be stored at home. NOTE: This sheet is a summary. It may not cover all possible information. If you have questions about this medicine, talk to your doctor, pharmacist, or health care provider.  2023 Elsevier/Gold Standard (2021-11-13 00:00:00)

## 2022-05-22 NOTE — Assessment & Plan Note (Signed)
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; lymph node biopsy: Positive, breast biopsy: Grade 2 IDC ER 100%, PR 0%, HER2 negative, Ki-67 10%  Treatment plan: 1.Neoadjuvant chemotherapy with dose dense Adriamycin and Cytoxan x4 followed by Taxol weekly x12 2.breast conserving surgery with targeted node dissection 3.Adjuvant radiation 4.Follow-up antiestrogen therapy with abemaciclib --------------------------------------------------------------------------------------------------------------------------------- Current treatment: Completed 4 cycles of dose dense Adriamycin and Cytoxan, today cycle 1 Taxol  Chemotoxicities: 1.Neutropenia: Reduce the dosage from cycle 2. 2.Fatigue 3.Nausea: Fairly mild 4.Chemo-induced anemia: Hemoglobin today is 9.2: Monitoring  Return to clinic in  1 week for cycle 2 Taxol

## 2022-05-23 ENCOUNTER — Telehealth: Payer: Self-pay | Admitting: *Deleted

## 2022-05-23 NOTE — Telephone Encounter (Signed)
LM to call to see how she did with her treatment yesterday. To call us back if she has any questions or concerns

## 2022-05-23 NOTE — Telephone Encounter (Signed)
-----   Message from Charleston Poot, RN sent at 05/22/2022  1:52 PM EDT ----- Regarding: First time/ Paclitaxel/ Dr Lindi Adie pt Pt had first time paclitaxel today. Tolerated treatment well.  Thanks!

## 2022-05-26 ENCOUNTER — Other Ambulatory Visit: Payer: Medicare HMO

## 2022-05-28 MED FILL — Dexamethasone Sodium Phosphate Inj 100 MG/10ML: INTRAMUSCULAR | Qty: 1 | Status: AC

## 2022-05-29 ENCOUNTER — Inpatient Hospital Stay: Payer: Medicare HMO

## 2022-05-29 ENCOUNTER — Encounter: Payer: Self-pay | Admitting: General Practice

## 2022-05-29 ENCOUNTER — Inpatient Hospital Stay: Payer: Medicare HMO | Attending: Hematology and Oncology

## 2022-05-29 ENCOUNTER — Inpatient Hospital Stay: Payer: Medicare HMO | Admitting: Hematology and Oncology

## 2022-05-29 ENCOUNTER — Inpatient Hospital Stay: Payer: Medicare HMO | Admitting: Adult Health

## 2022-05-29 ENCOUNTER — Other Ambulatory Visit (HOSPITAL_COMMUNITY): Payer: Self-pay

## 2022-05-29 ENCOUNTER — Other Ambulatory Visit: Payer: Self-pay | Admitting: Pharmacist

## 2022-05-29 VITALS — BP 129/78 | HR 73 | Temp 97.2°F | Resp 18 | Ht 62.0 in | Wt 121.6 lb

## 2022-05-29 DIAGNOSIS — Z803 Family history of malignant neoplasm of breast: Secondary | ICD-10-CM | POA: Insufficient documentation

## 2022-05-29 DIAGNOSIS — Z21 Asymptomatic human immunodeficiency virus [HIV] infection status: Secondary | ICD-10-CM | POA: Insufficient documentation

## 2022-05-29 DIAGNOSIS — Z9049 Acquired absence of other specified parts of digestive tract: Secondary | ICD-10-CM | POA: Insufficient documentation

## 2022-05-29 DIAGNOSIS — Z801 Family history of malignant neoplasm of trachea, bronchus and lung: Secondary | ICD-10-CM | POA: Diagnosis not present

## 2022-05-29 DIAGNOSIS — D6481 Anemia due to antineoplastic chemotherapy: Secondary | ICD-10-CM | POA: Insufficient documentation

## 2022-05-29 DIAGNOSIS — R5383 Other fatigue: Secondary | ICD-10-CM | POA: Insufficient documentation

## 2022-05-29 DIAGNOSIS — R634 Abnormal weight loss: Secondary | ICD-10-CM | POA: Diagnosis not present

## 2022-05-29 DIAGNOSIS — N6489 Other specified disorders of breast: Secondary | ICD-10-CM | POA: Diagnosis not present

## 2022-05-29 DIAGNOSIS — K219 Gastro-esophageal reflux disease without esophagitis: Secondary | ICD-10-CM | POA: Diagnosis not present

## 2022-05-29 DIAGNOSIS — Z8249 Family history of ischemic heart disease and other diseases of the circulatory system: Secondary | ICD-10-CM | POA: Diagnosis not present

## 2022-05-29 DIAGNOSIS — D702 Other drug-induced agranulocytosis: Secondary | ICD-10-CM | POA: Insufficient documentation

## 2022-05-29 DIAGNOSIS — C50412 Malignant neoplasm of upper-outer quadrant of left female breast: Secondary | ICD-10-CM | POA: Diagnosis not present

## 2022-05-29 DIAGNOSIS — R110 Nausea: Secondary | ICD-10-CM | POA: Insufficient documentation

## 2022-05-29 DIAGNOSIS — T451X5A Adverse effect of antineoplastic and immunosuppressive drugs, initial encounter: Secondary | ICD-10-CM | POA: Diagnosis not present

## 2022-05-29 DIAGNOSIS — K5900 Constipation, unspecified: Secondary | ICD-10-CM | POA: Insufficient documentation

## 2022-05-29 DIAGNOSIS — Z880 Allergy status to penicillin: Secondary | ICD-10-CM | POA: Insufficient documentation

## 2022-05-29 DIAGNOSIS — Z170 Estrogen receptor positive status [ER+]: Secondary | ICD-10-CM | POA: Insufficient documentation

## 2022-05-29 DIAGNOSIS — Z5111 Encounter for antineoplastic chemotherapy: Secondary | ICD-10-CM | POA: Insufficient documentation

## 2022-05-29 DIAGNOSIS — Z800 Family history of malignant neoplasm of digestive organs: Secondary | ICD-10-CM | POA: Insufficient documentation

## 2022-05-29 DIAGNOSIS — R202 Paresthesia of skin: Secondary | ICD-10-CM | POA: Insufficient documentation

## 2022-05-29 DIAGNOSIS — Z79899 Other long term (current) drug therapy: Secondary | ICD-10-CM | POA: Diagnosis not present

## 2022-05-29 DIAGNOSIS — Z87891 Personal history of nicotine dependence: Secondary | ICD-10-CM | POA: Insufficient documentation

## 2022-05-29 DIAGNOSIS — I10 Essential (primary) hypertension: Secondary | ICD-10-CM | POA: Insufficient documentation

## 2022-05-29 DIAGNOSIS — B20 Human immunodeficiency virus [HIV] disease: Secondary | ICD-10-CM

## 2022-05-29 LAB — CBC WITH DIFFERENTIAL (CANCER CENTER ONLY)
Abs Immature Granulocytes: 0.02 10*3/uL (ref 0.00–0.07)
Basophils Absolute: 0 10*3/uL (ref 0.0–0.1)
Basophils Relative: 1 %
Eosinophils Absolute: 0 10*3/uL (ref 0.0–0.5)
Eosinophils Relative: 1 %
HCT: 26.2 % — ABNORMAL LOW (ref 36.0–46.0)
Hemoglobin: 8.7 g/dL — ABNORMAL LOW (ref 12.0–15.0)
Immature Granulocytes: 1 %
Lymphocytes Relative: 17 %
Lymphs Abs: 0.5 10*3/uL — ABNORMAL LOW (ref 0.7–4.0)
MCH: 29.7 pg (ref 26.0–34.0)
MCHC: 33.2 g/dL (ref 30.0–36.0)
MCV: 89.4 fL (ref 80.0–100.0)
Monocytes Absolute: 0.3 10*3/uL (ref 0.1–1.0)
Monocytes Relative: 11 %
Neutro Abs: 1.9 10*3/uL (ref 1.7–7.7)
Neutrophils Relative %: 69 %
Platelet Count: 196 10*3/uL (ref 150–400)
RBC: 2.93 MIL/uL — ABNORMAL LOW (ref 3.87–5.11)
RDW: 16.8 % — ABNORMAL HIGH (ref 11.5–15.5)
WBC Count: 2.8 10*3/uL — ABNORMAL LOW (ref 4.0–10.5)
nRBC: 0 % (ref 0.0–0.2)

## 2022-05-29 LAB — CMP (CANCER CENTER ONLY)
ALT: 25 U/L (ref 0–44)
AST: 25 U/L (ref 15–41)
Albumin: 4 g/dL (ref 3.5–5.0)
Alkaline Phosphatase: 69 U/L (ref 38–126)
Anion gap: 5 (ref 5–15)
BUN: 10 mg/dL (ref 8–23)
CO2: 30 mmol/L (ref 22–32)
Calcium: 9.5 mg/dL (ref 8.9–10.3)
Chloride: 103 mmol/L (ref 98–111)
Creatinine: 0.8 mg/dL (ref 0.44–1.00)
GFR, Estimated: >60 mL/min (ref >60)
Glucose, Bld: 118 mg/dL — ABNORMAL HIGH (ref 70–99)
Potassium: 3.7 mmol/L (ref 3.5–5.1)
Sodium: 138 mmol/L (ref 135–145)
Total Bilirubin: 0.3 mg/dL (ref 0.3–1.2)
Total Protein: 6.5 g/dL (ref 6.5–8.1)

## 2022-05-29 MED ORDER — SODIUM CHLORIDE 0.9 % IV SOLN
10.0000 mg | Freq: Once | INTRAVENOUS | Status: AC
  Administered 2022-05-29: 10 mg via INTRAVENOUS
  Filled 2022-05-29: qty 10

## 2022-05-29 MED ORDER — SODIUM CHLORIDE 0.9 % IV SOLN: Freq: Once | INTRAVENOUS | Status: AC

## 2022-05-29 MED ORDER — FAMOTIDINE IN NACL 20-0.9 MG/50ML-% IV SOLN
20.0000 mg | Freq: Once | INTRAVENOUS | Status: AC
  Administered 2022-05-29: 20 mg via INTRAVENOUS
  Filled 2022-05-29: qty 50

## 2022-05-29 MED ORDER — CETIRIZINE HCL 10 MG/ML IV SOLN
10.0000 mg | Freq: Once | INTRAVENOUS | Status: AC
  Administered 2022-05-29: 10 mg via INTRAVENOUS
  Filled 2022-05-29: qty 1

## 2022-05-29 MED ORDER — SODIUM CHLORIDE 0.9% FLUSH
10.0000 mL | INTRAVENOUS | Status: DC | PRN
  Administered 2022-05-29: 10 mL via INTRAVENOUS

## 2022-05-29 MED ORDER — SODIUM CHLORIDE 0.9 % IV SOLN
50.0000 mg/m2 | Freq: Once | INTRAVENOUS | Status: AC
  Administered 2022-05-29: 78 mg via INTRAVENOUS
  Filled 2022-05-29: qty 13

## 2022-05-29 NOTE — Progress Notes (Signed)
[facility] Spiritual Care Note  Visited with Veronica Robles per referral from nursing for emotional support related to anxiety. Veronica Robles was very appreciative of pastoral presence and opportunity to begin to process her feelings. She is missing her "old normal," particularly her previous activity level, and found it helpful to label this experience as grief. Also taught her the "5-4-3-2-1 Grounding Method" to interrupt anxiety. (Look around the room and name to yourself five things that you can see. Then notice four things you can feel with your sense of touch. Three/hear, two/smell, one/taste...)  Provided pastoral presence, empathic listening, and emotional support. We plan to follow up at her next treatment to make more space for her processing her treatment and associated life changes.   [place], [place], [facility] Pager [phone] Voicemail [phone]

## 2022-05-29 NOTE — Progress Notes (Signed)
Patient Care Team: [name], MD as PCP - General (Family Medicine) Carlyle Basques, MD as PCP - Infectious Diseases (Infectious Diseases) Rockwell Germany, RN as Oncology Nurse Navigator Mauro Kaufmann, RN as Oncology Nurse Navigator  DIAGNOSIS:  Encounter Diagnosis  Name Primary?   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; lymph node biopsy: Positive, breast biopsy: Grade 2 IDC ER 100%, PR 0%, HER2 negative, Ki-67 10%   03/10/2022 Cancer Staging   Staging form: Breast, AJCC 8th Edition - Clinical stage from 03/10/2022: Stage IIA (cT1b, cN1(f), cM0, G2, ER+, PR-, HER2-) - Signed by Hayden Pedro, PA-C on 03/10/2022 Stage prefix: Initial diagnosis Method of lymph node assessment: Core biopsy Histologic grading system: 3 grade system   03/27/2022 -  Chemotherapy   Patient is on Treatment Plan : BREAST ADJUVANT DOSE DENSE AC q14d / PACLitaxel q7d       CHIEF COMPLIANT: Cycle 2 Taxol  INTERVAL HISTORY: Veronica Robles is a 66 year old with above-mentioned history of left breast cancer is currently on adjuvant chemotherapy and today cycle 2 of Taxol.  She tolerated cycle 1 Taxol fairly well.  She felt weak on the third or fourth day for a few days and then she recovered.  She has been eating well her taste and appetite are excellent.  Denies any severe nausea but she does have occasional mild nausea for which she takes Zofran which is helping her.  No issues with bowel function.  She is not constipated and denies any diarrhea.   ALLERGIES:  is  allergic to penicillins.  MEDICATIONS:  Current Outpatient Medications  Medication Sig Dispense Refill   Blood Pressure Monitoring (ADULT BLOOD PRESSURE CUFF LG) KIT       dexamethasone (DECADRON) 4 MG tablet Take 1 tablet by mouth the day after chemo and 1 tablet 2 days after chemo; take with food 8 tablet 0   emtricitabine-tenofovir AF (DESCOVY) 200-25 MG tablet Take 1 tablet by mouth daily. 30 tablet 5   fostemsavir tromethamine (RUKOBIA) 600 MG TB12 ER tablet Take 1 tablet (600 mg total) by mouth 2 (two) times daily. 60 tablet 5   hydrochlorothiazide (HYDRODIURIL) 25 MG tablet Take 1 tablet (25 mg total) by mouth daily. 90 tablet 1   lenacapavir (SUNLENCA) 300 mg tablet Take 2 tablets by mouth See admin instructions. This is the 2-day initiation kit: Take two 300-mg tablets on Day 1 & Day 2 with or without food. 4 each 0   lidocaine-prilocaine (EMLA) cream Apply to affected area once 30 g 3   ondansetron (ZOFRAN) 8 MG tablet Take 1 tablet (8 mg) by mouth every 8 hours as needed for nausea/vomiting. Start third day after doxorubicin/cyclophosphamide chemotherapy. 30 tablet 1   prochlorperazine (COMPAZINE) 10 MG tablet Take 1 tablet (10 mg total) by mouth every 6 (six) hours as needed for nausea or vomiting. 30 tablet 1   SQ injection lenacapavir (SUNLENCA) 463.5 MG/1.5ML SQ injection Inject 3 mLs (927 mg total) into the skin every 6 (six) months. Administer each injection subcutaneously at separate sites in the abdomen (more or equal to 2  inches from the navel). 3 mL 2   traMADol (ULTRAM) 50 MG tablet Take 1 tablet (50 mg total) by mouth every 6 (six) hours as needed for moderate pain or severe pain. (Patient not taking: Reported on 04/24/2022) 20 tablet 0   No current facility-administered medications for this visit.   Facility-Administered Medications Ordered in Other Visits  Medication Dose Route Frequency Provider Last Rate Last Admin   ciprofloxacin (CIPRO) IVPB 400 mg  400 mg Intravenous  Q12H Kathie Rhodes, MD   400 mg at 03/26/22 0728    PHYSICAL EXAMINATION: ECOG PERFORMANCE STATUS: 1 - Symptomatic but completely ambulatory  Vitals:   05/29/22 0952  BP: 129/78  Pulse: 73  Resp: 18  Temp: (!) 97.2 F (36.2 C)  SpO2: 99%   Filed Weights   05/29/22 0952  Weight: 121 lb 9.6 oz (55.2 kg)      LABORATORY DATA:  I have reviewed the data as listed    Latest Ref Rng & Units 05/29/2022    8:58 AM 05/22/2022    9:43 AM 05/08/2022   10:36 AM  CMP  Glucose 70 - 99 mg/dL 118  132  111   BUN 8 - 23 mg/dL _0 Creatinine 0.44 - 1.00 mg/dL 0.80  0.91  0.75   Sodium 135 - 145 mmol/L 138  140  139   Potassium 3.5 - 5.1 mmol/L 3.7  3.7  3.9   Chloride 98 - 111 mmol/L 103  104  104   CO2 22 - 32 mmol/L _1 Calcium 8.9 - 10.3 mg/dL 9.5  9.7  9.2   Total Protein 6.5 - 8.1 g/dL 6.5  6.5  6.7   Total Bilirubin 0.3 - 1.2 mg/dL 0.3  0.3  0.2   Alkaline Phos 38 - 126 U/L 69  115  116   AST 15 - 41 U/L _2 ALT 0 - 44 U/L _3 Lab Results  Component Value Date   WBC 2.8 (L) 05/29/2022   HGB 8.7 (L) 05/29/2022   HCT 26.2 (L) 05/29/2022   MCV 89.4 05/29/2022   PLT 196 05/29/2022   NEUTROABS 1.9 05/29/2022    ASSESSMENT & PLAN:  Malignant neoplasm of upper-outer quadrant of left breast in female, estrogen receptor positive ([place]) 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; lymph node biopsy: Positive, breast biopsy: Grade 2 IDC ER 100%, PR 0%, HER2 negative, Ki-67 10%   Treatment plan: 1.  Neoadjuvant chemotherapy with dose dense Adriamycin and Cytoxan x4 followed by Taxol weekly x12 2. breast conserving surgery with targeted node dissection 3.  Adjuvant radiation 4.  Follow-up antiestrogen therapy with  abemaciclib --------------------------------------------------------------------------------------------------------------------------------- Current treatment: Completed 4 cycles of dose dense Adriamycin and Cytoxan, today cycle 2 Taxol   Chemotoxicities: 1.  Neutropenia: Reduce the dosage from cycle 2. will reduce it to 50 mg per metered square 2. Fatigue 3.  Nausea: Fairly mild: Takes intermittent Zofran 4.  Chemo-induced anemia: Hemoglobin today is 8.7: Monitoring   Return to clinic weekly for Taxol and every other week for follow-up with me.   No orders of the defined types were placed in this encounter.  The patient has a good understanding of the overall plan. she agrees with it. she will  call with any problems that may develop before the next visit here. Total time spent: 30 mins including face to face time and time spent for planning, charting and co-ordination of care   Harriette Ohara, MD 05/29/22

## 2022-05-29 NOTE — Patient Instructions (Signed)
[facility]   Discharge Instructions: Thank you for choosing [facility] to provide your oncology and hematology care.   If you have a lab appointment with the Cancer Center, please go directly to the Cancer Center and check in at the registration area.   Wear comfortable clothing and clothing appropriate for easy access to any Portacath or PICC line.   We strive to give you quality time with your provider. You may need to reschedule your appointment if you arrive late (15 or more minutes).  Arriving late affects you and other patients whose appointments are after yours.  Also, if you miss three or more appointments without notifying the office, you may be dismissed from the clinic at the provider's discretion.      For prescription refill requests, have your pharmacy contact our office and allow 72 hours for refills to be completed.    Today you received the following chemotherapy and/or immunotherapy agents: paclitaxel      To help prevent nausea and vomiting after your treatment, we encourage you to take your nausea medication as directed.  BELOW ARE SYMPTOMS THAT SHOULD BE REPORTED IMMEDIATELY: *FEVER GREATER THAN 100.4 F (38 C) OR HIGHER *CHILLS OR SWEATING *NAUSEA AND VOMITING THAT IS NOT CONTROLLED WITH YOUR NAUSEA MEDICATION *UNUSUAL SHORTNESS OF BREATH *UNUSUAL BRUISING OR BLEEDING *URINARY PROBLEMS (pain or burning when urinating, or frequent urination) *BOWEL PROBLEMS (unusual diarrhea, constipation, pain near the anus) TENDERNESS IN MOUTH AND THROAT WITH OR WITHOUT PRESENCE OF ULCERS (sore throat, sores in mouth, or a toothache) UNUSUAL RASH, SWELLING OR PAIN  UNUSUAL VAGINAL DISCHARGE OR ITCHING   Items with * indicate a potential emergency and should be followed up as soon as possible or go to the Emergency Department if any problems should occur.  Please show the CHEMOTHERAPY ALERT CARD or IMMUNOTHERAPY ALERT CARD at check-in  to the Emergency Department and triage nurse.  Should you have questions after your visit or need to cancel or reschedule your appointment, please contact [facility]  Dept: [phone]  and follow the prompts.  Office hours are 8:00 a.m. to 4:30 p.m. Monday - Friday. Please note that voicemails left after 4:00 p.m. may not be returned until the following business day.  We are closed weekends and major holidays. You have access to a nurse at all times for urgent questions. Please call the main number to the clinic Dept: [phone] and follow the prompts.   For any non-urgent questions, you may also contact your provider using MyChart. We now offer e-Visits for anyone 18 and older to request care online for non-urgent symptoms. For details visit [email or web address].   Also download the MyChart app! Go to the app store, search "MyChart", open the app, select [facility], and log in with your MyChart username and password.  Masks are optional in the cancer centers. If you would like for your care team to wear a mask while they are taking care of you, please let them know. You may have one support person who is at least 66 years old accompany you for your appointments. 

## 2022-05-29 NOTE — Assessment & Plan Note (Signed)
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  Neoadjuvant chemotherapy with dose dense Adriamycin and Cytoxan x4 followed by Taxol weekly x12 2. breast conserving surgery with targeted node dissection 3.  Adjuvant radiation 4.  Follow-up antiestrogen therapy with abemaciclib --------------------------------------------------------------------------------------------------------------------------------- Current treatment: Completed 4 cycles of dose dense Adriamycin and Cytoxan, today cycle 2 Taxol   Chemotoxicities: 1.  Neutropenia: Reduce the dosage from cycle 2. 2. Fatigue 3.  Nausea: Fairly mild 4.  Chemo-induced anemia: Hemoglobin today is 9.2: Monitoring   Return to clinic weekly for Taxol and every other week for follow-up with me.

## 2022-05-30 ENCOUNTER — Other Ambulatory Visit (HOSPITAL_COMMUNITY): Payer: Self-pay

## 2022-05-30 ENCOUNTER — Telehealth: Payer: Self-pay | Admitting: Hematology and Oncology

## 2022-05-30 NOTE — Telephone Encounter (Signed)
Scheduled appointments per 11/2 los and per the WQ. Left voicemail.

## 2022-06-04 MED FILL — Dexamethasone Sodium Phosphate Inj 100 MG/10ML: INTRAMUSCULAR | Qty: 1 | Status: AC

## 2022-06-04 NOTE — Progress Notes (Signed)
Patient Care Team: [name], MD as PCP - General (Family Medicine) Carlyle Basques, MD as PCP - Infectious Diseases (Infectious Diseases) Rockwell Germany, RN as Oncology Nurse Navigator Mauro Kaufmann, RN as Oncology Nurse Navigator  DIAGNOSIS: No diagnosis found.  SUMMARY OF ONCOLOGIC HISTORY: Oncology History  Malignant neoplasm of upper-outer quadrant of left breast in female, estrogen receptor positive ([place])  02/28/2022 Initial Diagnosis   Screening detected left breast mass at 3 o'clock position measuring 0.9 cm, 4 enlarged left axillary lymph nodes along with asymmetric ducts between mass and the nipple possibly DCIS; lymph node biopsy: Positive, breast biopsy: Grade 2 IDC ER 100%, PR 0%, HER2 negative, Ki-67 10%   03/10/2022 Cancer Staging   Staging form: Breast, AJCC 8th Edition - Clinical stage from 03/10/2022: Stage IIA (cT1b, cN1(f), cM0, G2, ER+, PR-, HER2-) - Signed by Hayden Pedro, PA-C on 03/10/2022 Stage prefix: Initial diagnosis Method of lymph node assessment: Core biopsy Histologic grading system: 3 grade system   03/27/2022 -  Chemotherapy   Patient is on Treatment Plan : BREAST ADJUVANT DOSE DENSE AC q14d / PACLitaxel q7d       CHIEF COMPLIANT:   INTERVAL HISTORY: Veronica Robles is a   ALLERGIES:  is allergic to penicillins.  MEDICATIONS:  Current Outpatient Medications  Medication Sig Dispense Refill   Blood Pressure Monitoring (ADULT BLOOD PRESSURE CUFF LG) KIT       dexamethasone (DECADRON) 4 MG tablet Take 1 tablet by mouth the day after chemo and 1 tablet 2 days after chemo; take with food 8 tablet 0   emtricitabine-tenofovir AF (DESCOVY) 200-25 MG tablet Take 1 tablet by mouth daily. 30 tablet 5   fostemsavir tromethamine (RUKOBIA) 600 MG TB12 ER tablet Take 1 tablet (600 mg total) by mouth 2 (two) times daily. 60 tablet 5   hydrochlorothiazide (HYDRODIURIL) 25 MG tablet Take 1 tablet (25 mg total) by mouth daily. 90 tablet 1    lenacapavir (SUNLENCA) 300 mg tablet Take 2 tablets by mouth See admin instructions. This is the 2-day initiation kit: Take two 300-mg tablets on Day 1 & Day 2 with or without food. 4 each 0   lidocaine-prilocaine (EMLA) cream Apply to affected area once 30 g 3   ondansetron (ZOFRAN) 8 MG tablet Take 1 tablet (8 mg) by mouth every 8 hours as needed for nausea/vomiting. Start third day after doxorubicin/cyclophosphamide chemotherapy. 30 tablet 1   prochlorperazine (COMPAZINE) 10 MG tablet Take 1 tablet (10 mg total) by mouth every 6 (six) hours as needed for nausea or vomiting. 30 tablet 1   SQ injection lenacapavir (SUNLENCA) 463.5 MG/1.5ML SQ injection Inject 3 mLs (927 mg total) into the skin every 6 (six) months. Administer each injection subcutaneously at separate sites in the abdomen (more or equal to 2 inches from the navel). 3 mL 2   traMADol (ULTRAM) 50 MG tablet Take 1 tablet (50 mg total) by mouth every 6 (six) hours as needed for moderate pain or severe pain. (Patient not taking: Reported on 04/24/2022) 20 tablet 0   No current facility-administered medications for this visit.   Facility-Administered Medications Ordered in Other Visits  Medication Dose Route Frequency Provider Last Rate Last Admin   ciprofloxacin (CIPRO) IVPB 400 mg  400 mg Intravenous Q12H Kathie Rhodes, MD   400 mg at 03/26/22 [id number]    PHYSICAL EXAMINATION: ECOG PERFORMANCE STATUS: {CHL ONC ECOG [phone]}  There were no vitals filed for this visit. There were no vitals  filed for this visit.  BREAST:*** No palpable masses or nodules in either right or left breasts. No palpable axillary supraclavicular or infraclavicular adenopathy no breast tenderness or nipple discharge. (exam performed in the presence of a chaperone)  LABORATORY DATA:  I have reviewed the data as listed    Latest Ref Rng & Units 05/29/2022    8:58 AM 05/22/2022    9:43 AM 05/08/2022   10:36 AM  CMP  Glucose 70 - 99 mg/dL 118  132  111    BUN 8 - 23 mg/dL _0 Creatinine 0.44 - 1.00 mg/dL 0.80  0.91  0.75   Sodium 135 - 145 mmol/L 138  140  139   Potassium 3.5 - 5.1 mmol/L 3.7  3.7  3.9   Chloride 98 - 111 mmol/L 103  104  104   CO2 22 - 32 mmol/L _1 Calcium 8.9 - 10.3 mg/dL 9.5  9.7  9.2   Total Protein 6.5 - 8.1 g/dL 6.5  6.5  6.7   Total Bilirubin 0.3 - 1.2 mg/dL 0.3  0.3  0.2   Alkaline Phos 38 - 126 U/L 69  115  116   AST 15 - 41 U/L _2 ALT 0 - 44 U/L _3 Lab Results  Component Value Date   WBC 2.8 (L) 05/29/2022   HGB 8.7 (L) 05/29/2022   HCT 26.2 (L) 05/29/2022   MCV 89.4 05/29/2022   PLT 196 05/29/2022   NEUTROABS 1.9 05/29/2022    ASSESSMENT & PLAN:  No problem-specific Assessment & Plan notes found for this encounter.    No orders of the defined types were placed in this encounter.  The patient has a good understanding of the overall plan. she agrees with it. she will call with any problems that may develop before the next visit here. Total time spent: 30 mins including face to face time and time spent for planning, charting and co-ordination of care   Veronica Robles, [place] 06/04/22    I Gardiner Coins am scribing for Dr. Lindi Adie  ***

## 2022-06-05 ENCOUNTER — Inpatient Hospital Stay (HOSPITAL_BASED_OUTPATIENT_CLINIC_OR_DEPARTMENT_OTHER): Payer: Medicare HMO | Admitting: Hematology and Oncology

## 2022-06-05 ENCOUNTER — Encounter: Payer: Self-pay | Admitting: General Practice

## 2022-06-05 ENCOUNTER — Encounter: Payer: Self-pay | Admitting: Dietician

## 2022-06-05 ENCOUNTER — Other Ambulatory Visit: Payer: Self-pay

## 2022-06-05 ENCOUNTER — Encounter: Payer: Self-pay | Admitting: Hematology and Oncology

## 2022-06-05 ENCOUNTER — Encounter: Payer: Self-pay | Admitting: *Deleted

## 2022-06-05 ENCOUNTER — Inpatient Hospital Stay: Payer: Medicare HMO

## 2022-06-05 VITALS — BP 125/70 | HR 78 | Temp 97.8°F | Resp 18 | Ht 62.0 in | Wt 122.0 lb

## 2022-06-05 DIAGNOSIS — C50412 Malignant neoplasm of upper-outer quadrant of left female breast: Secondary | ICD-10-CM

## 2022-06-05 DIAGNOSIS — Z170 Estrogen receptor positive status [ER+]: Secondary | ICD-10-CM

## 2022-06-05 DIAGNOSIS — Z5111 Encounter for antineoplastic chemotherapy: Secondary | ICD-10-CM | POA: Diagnosis not present

## 2022-06-05 LAB — CBC WITH DIFFERENTIAL (CANCER CENTER ONLY)
Abs Immature Granulocytes: 0.02 10*3/uL (ref 0.00–0.07)
Basophils Absolute: 0 10*3/uL (ref 0.0–0.1)
Basophils Relative: 1 %
Eosinophils Absolute: 0.1 10*3/uL (ref 0.0–0.5)
Eosinophils Relative: 3 %
HCT: 28.5 % — ABNORMAL LOW (ref 36.0–46.0)
Hemoglobin: 9.3 g/dL — ABNORMAL LOW (ref 12.0–15.0)
Immature Granulocytes: 1 %
Lymphocytes Relative: 22 %
Lymphs Abs: 0.6 10*3/uL — ABNORMAL LOW (ref 0.7–4.0)
MCH: 30.2 pg (ref 26.0–34.0)
MCHC: 32.6 g/dL (ref 30.0–36.0)
MCV: 92.5 fL (ref 80.0–100.0)
Monocytes Absolute: 0.5 10*3/uL (ref 0.1–1.0)
Monocytes Relative: 19 %
Neutro Abs: 1.5 10*3/uL — ABNORMAL LOW (ref 1.7–7.7)
Neutrophils Relative %: 54 %
Platelet Count: 160 10*3/uL (ref 150–400)
RBC: 3.08 MIL/uL — ABNORMAL LOW (ref 3.87–5.11)
RDW: 17.7 % — ABNORMAL HIGH (ref 11.5–15.5)
WBC Count: 2.7 10*3/uL — ABNORMAL LOW (ref 4.0–10.5)
nRBC: 0 % (ref 0.0–0.2)

## 2022-06-05 LAB — CMP (CANCER CENTER ONLY)
ALT: 25 U/L (ref 0–44)
AST: 26 U/L (ref 15–41)
Albumin: 3.9 g/dL (ref 3.5–5.0)
Alkaline Phosphatase: 60 U/L (ref 38–126)
Anion gap: 4 — ABNORMAL LOW (ref 5–15)
BUN: 9 mg/dL (ref 8–23)
CO2: 30 mmol/L (ref 22–32)
Calcium: 9.3 mg/dL (ref 8.9–10.3)
Chloride: 106 mmol/L (ref 98–111)
Creatinine: 0.77 mg/dL (ref 0.44–1.00)
GFR, Estimated: >60 mL/min (ref >60)
Glucose, Bld: 104 mg/dL — ABNORMAL HIGH (ref 70–99)
Potassium: 3.7 mmol/L (ref 3.5–5.1)
Sodium: 140 mmol/L (ref 135–145)
Total Bilirubin: 0.3 mg/dL (ref 0.3–1.2)
Total Protein: 6.2 g/dL — ABNORMAL LOW (ref 6.5–8.1)

## 2022-06-05 MED ORDER — CETIRIZINE HCL 10 MG/ML IV SOLN
10.0000 mg | Freq: Once | INTRAVENOUS | Status: AC
  Administered 2022-06-05: 10 mg via INTRAVENOUS
  Filled 2022-06-05: qty 1

## 2022-06-05 MED ORDER — SODIUM CHLORIDE 0.9 % IV SOLN: Freq: Once | INTRAVENOUS | Status: AC

## 2022-06-05 MED ORDER — SODIUM CHLORIDE 0.9 % IV SOLN
50.0000 mg/m2 | Freq: Once | INTRAVENOUS | Status: AC
  Administered 2022-06-05: 78 mg via INTRAVENOUS
  Filled 2022-06-05: qty 13

## 2022-06-05 MED ORDER — SODIUM CHLORIDE 0.9% FLUSH
10.0000 mL | INTRAVENOUS | Status: DC | PRN
  Administered 2022-06-05: 10 mL

## 2022-06-05 MED ORDER — SODIUM CHLORIDE 0.9% FLUSH
3.0000 mL | INTRAVENOUS | Status: DC | PRN
  Administered 2022-06-05: 10 mL

## 2022-06-05 MED ORDER — SODIUM CHLORIDE 0.9 % IV SOLN
10.0000 mg | Freq: Once | INTRAVENOUS | Status: AC
  Administered 2022-06-05: 10 mg via INTRAVENOUS
  Filled 2022-06-05: qty 10

## 2022-06-05 MED ORDER — HEPARIN SOD (PORK) LOCK FLUSH 100 UNIT/ML IV SOLN
500.0000 [IU] | Freq: Once | INTRAVENOUS | Status: AC | PRN
  Administered 2022-06-05: 500 [IU]

## 2022-06-05 MED ORDER — FAMOTIDINE IN NACL 20-0.9 MG/50ML-% IV SOLN
20.0000 mg | Freq: Once | INTRAVENOUS | Status: AC
  Administered 2022-06-05: 20 mg via INTRAVENOUS
  Filled 2022-06-05: qty 50

## 2022-06-05 NOTE — Progress Notes (Signed)
Provided one complementary case of Ensure Complete

## 2022-06-05 NOTE — Progress Notes (Signed)
[facility] Spiritual Care Note  Followed up with Veronica Robles in infusion as planned. She reports continuing to adjust to treatment and to her new normal (which we are hoping will be temporary) of reduced activity and caregiving in order to engage in more rest and self-care. She is also clear about her plan to disclose her diagnosis/treatment to her social circles in limited ways in order to maintain a sense of privacy and control.  Provided pastoral presence, empathic listening, emotional support, and affirmation of strengths, serving as a witness to her unfolding story and increasing comfort. We plan to continue to follow up at treatments for more processing and reflection opportunities. Veronica Kotz notes that chaplain's visit "made [her] day."   Chaplain Shelva Majestic, [facility] Pager [phone] Voicemail [phone]

## 2022-06-05 NOTE — Patient Instructions (Signed)
[facility]   Discharge Instructions: Thank you for choosing [facility] to provide your oncology and hematology care.   If you have a lab appointment with the [facility], please go directly to the [facility] and check in at the registration area.   Wear comfortable clothing and clothing appropriate for easy access to any Portacath or PICC line.   We strive to give you quality time with your provider. You may need to reschedule your appointment if you arrive late (15 or more minutes).  Arriving late affects you and other patients whose appointments are after yours.  Also, if you miss three or more appointments without notifying the office, you may be dismissed from the clinic at the provider's discretion.      For prescription refill requests, have your pharmacy contact our office and allow 72 hours for refills to be completed.    Today you received the following chemotherapy and/or immunotherapy agents: Paclitaxel (Taxol)      To help prevent nausea and vomiting after your treatment, we encourage you to take your nausea medication as directed.  BELOW ARE SYMPTOMS THAT SHOULD BE REPORTED IMMEDIATELY: *FEVER GREATER THAN 100.4 F (38 C) OR HIGHER *CHILLS OR SWEATING *NAUSEA AND VOMITING THAT IS NOT CONTROLLED WITH YOUR NAUSEA MEDICATION *UNUSUAL SHORTNESS OF BREATH *UNUSUAL BRUISING OR BLEEDING *URINARY PROBLEMS (pain or burning when urinating, or frequent urination) *BOWEL PROBLEMS (unusual diarrhea, constipation, pain near the anus) TENDERNESS IN MOUTH AND THROAT WITH OR WITHOUT PRESENCE OF ULCERS (sore throat, sores in mouth, or a toothache) UNUSUAL RASH, SWELLING OR PAIN  UNUSUAL VAGINAL DISCHARGE OR ITCHING   Items with * indicate a potential emergency and should be followed up as soon as possible or go to the Emergency Department if any problems should occur.  Please show the CHEMOTHERAPY ALERT CARD or IMMUNOTHERAPY ALERT CARD at  check-in to the Emergency Department and triage nurse.  Should you have questions after your visit or need to cancel or reschedule your appointment, please contact [facility]  Dept: [phone]  and follow the prompts.  Office hours are 8:00 a.m. to 4:30 p.m. Monday - Friday. Please note that voicemails left after 4:00 p.m. may not be returned until the following business day.  We are closed weekends and major holidays. You have access to a nurse at all times for urgent questions. Please call the main number to the clinic Dept: [phone] and follow the prompts.   For any non-urgent questions, you may also contact your provider using MyChart. We now offer e-Visits for anyone 1 and older to request care online for non-urgent symptoms. For details visit [email or web address].   Also download the MyChart app! Go to the app store, search "MyChart", open the app, select [facility], and log in with your MyChart username and password.  Masks are optional in the cancer centers. If you would like for your care team to wear a mask while they are taking care of you, please let them know. You may have one support person who is at least 66 years old accompany you for your appointments.

## 2022-06-05 NOTE — Assessment & Plan Note (Signed)
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  Neoadjuvant chemotherapy with dose dense Adriamycin and Cytoxan x4 followed by Taxol weekly x12 2. breast conserving surgery with targeted node dissection 3.  Adjuvant radiation 4.  Follow-up antiestrogen therapy with abemaciclib --------------------------------------------------------------------------------------------------------------------------------- Current treatment: Completed 4 cycles of dose dense Adriamycin and Cytoxan, today cycle 3 Taxol   Chemotoxicities: 1.  Neutropenia: Reduce the dosage from cycle 2. Reduced dose to 50 mg per metered square 2. Fatigue 3.  Nausea: Fairly mild: Takes intermittent Zofran 4.  Chemo-induced anemia: Hemoglobin today is 8.7: Monitoring   Return to clinic weekly for Taxol and every other week for follow-up with me.

## 2022-06-09 ENCOUNTER — Other Ambulatory Visit: Payer: Self-pay

## 2022-06-09 ENCOUNTER — Ambulatory Visit (INDEPENDENT_AMBULATORY_CARE_PROVIDER_SITE_OTHER): Payer: Medicare HMO | Admitting: Internal Medicine

## 2022-06-09 ENCOUNTER — Encounter: Payer: Self-pay | Admitting: Internal Medicine

## 2022-06-09 VITALS — BP 119/76 | HR 84 | Temp 98.8°F | Ht 62.0 in | Wt 122.0 lb

## 2022-06-09 DIAGNOSIS — B20 Human immunodeficiency virus [HIV] disease: Secondary | ICD-10-CM | POA: Diagnosis not present

## 2022-06-09 DIAGNOSIS — T451X5A Adverse effect of antineoplastic and immunosuppressive drugs, initial encounter: Secondary | ICD-10-CM | POA: Diagnosis not present

## 2022-06-09 DIAGNOSIS — Z79899 Other long term (current) drug therapy: Secondary | ICD-10-CM | POA: Diagnosis not present

## 2022-06-09 DIAGNOSIS — D701 Agranulocytosis secondary to cancer chemotherapy: Secondary | ICD-10-CM | POA: Diagnosis not present

## 2022-06-09 MED ORDER — SULFAMETHOXAZOLE-TRIMETHOPRIM 200-40 MG/5ML PO SUSP: 20.0000 mL | Freq: Every day | ORAL | 5 refills | Status: DC

## 2022-06-09 NOTE — Progress Notes (Signed)
RFV: follow up for hiv disease  Patient ID: Veronica Robles, female   DOB: November 20, 1955, 66 y.o.   MRN: [id number]  HPI Veronica Robles is a 66yo F with HIV disease, hx of salvage regimen, and also now regimen of descovy, rukobia, and sunlenca injection.   Has missed one dose of meds in the last 30 days due to severe constipation.   Breast cancer treatment plan:  1.  Neoadjuvant chemotherapy with dose dense Adriamycin and Cytoxan x4 followed by Taxol weekly x12 2. breast conserving surgery with targeted node dissection 3.  Adjuvant radiation 4.  Follow-up antiestrogen therapy with abemaciclib  Outpatient Encounter Medications as of 06/09/2022  Medication Sig   dexamethasone (DECADRON) 4 MG tablet Take 1 tablet by mouth the day after chemo and 1 tablet 2 days after chemo; take with food   emtricitabine-tenofovir AF (DESCOVY) 200-25 MG tablet Take 1 tablet by mouth daily.   fostemsavir tromethamine (RUKOBIA) 600 MG TB12 ER tablet Take 1 tablet (600 mg total) by mouth 2 (two) times daily.   hydrochlorothiazide (HYDRODIURIL) 25 MG tablet Take 1 tablet (25 mg total) by mouth daily.   lenacapavir (SUNLENCA) 300 mg tablet Take 2 tablets by mouth See admin instructions. This is the 2-day initiation kit: Take two 300-mg tablets on Day 1 & Day 2 with or without food.   lidocaine-prilocaine (EMLA) cream Apply to affected area once   ondansetron (ZOFRAN) 8 MG tablet Take 1 tablet (8 mg) by mouth every 8 hours as needed for nausea/vomiting. Start third day after doxorubicin/cyclophosphamide chemotherapy.   prochlorperazine (COMPAZINE) 10 MG tablet Take 1 tablet (10 mg total) by mouth every 6 (six) hours as needed for nausea or vomiting.   SQ injection lenacapavir (SUNLENCA) 463.5 MG/1.5ML SQ injection Inject 3 mLs (927 mg total) into the skin every 6 (six) months. Administer each injection subcutaneously at separate sites in the abdomen (more or equal to 2 inches from the navel).   Blood Pressure  Monitoring (ADULT BLOOD PRESSURE CUFF LG) KIT     traMADol (ULTRAM) 50 MG tablet Take 1 tablet (50 mg total) by mouth every 6 (six) hours as needed for moderate pain or severe pain. (Patient not taking: Reported on 06/09/2022)   Facility-Administered Encounter Medications as of 06/09/2022  Medication   ciprofloxacin (CIPRO) IVPB 400 mg     Patient Active Problem List   Diagnosis Date Noted   Family history of breast cancer 03/20/2022   Family history of pancreatic cancer 03/20/2022   Gastroesophageal reflux disease without esophagitis 03/12/2022   Weight loss 03/12/2022   Malignant neoplasm of upper-outer quadrant of left breast in female, estrogen receptor positive ([place]) 03/07/2022   Dental caries 12/01/2021   Other viral warts 12/01/2021   Healthcare maintenance 11/09/2018   VISUAL IMPAIRMENT 10/01/2007   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   HYPERTENSION, BENIGN 01/26/2007   SHINGLES, RECURRENT 12/08/2006   HIV disease ([place]) 10/26/2006   HX, PERSONAL, PENICILLIN ALLERGY 10/26/2006   HX, PERSONAL, DRUG ALLERGY NOS 10/26/2006     Health Maintenance Due  Topic Date Due   Medicare Annual Wellness (AWV)  Never done   Zoster Vaccines- Shingrix (1 of 2) Never done   COLONOSCOPY (Pts 45-43yr Insurance coverage will need to be confirmed)  Never done   DEXA SCAN  Never done   COVID-19 Vaccine (5 - Pfizer risk series) 04/16/2021     Review of Systems 12 point ros is negative except what is mentioned in hpi Physical Exam   Ht 5'  2" (1.575 m)   Wt 122 lb (55.3 kg)   BMI 22.31 kg/m   Physical Exam  Constitutional:  oriented to person, place, and time. appears well-developed and well-nourished. No distress.  HENT: [place]/AT, PERRLA, no scleral icterus Mouth/Throat: Oropharynx is clear and moist. No oropharyngeal exudate.  Cardiovascular: Normal rate, regular rhythm and normal heart sounds. Exam reveals no gallop and no friction rub.  No murmur heard.  Pulmonary/Chest: Effort normal  and breath sounds normal. No respiratory distress.  has no wheezes.  Neck = supple, no nuchal rigidity Abdominal: Soft. Bowel sounds are normal.  exhibits no distension. There is no tenderness.  Lymphadenopathy: no cervical adenopathy. No axillary adenopathy Neurological: alert and oriented to person, place, and time.  Skin: Skin is warm and dry. No rash noted. No erythema.  Psychiatric: a normal mood and affect.  behavior is normal.   Lab Results  Component Value Date   CD4TCELL 21 (L) 03/21/2022   Lab Results  Component Value Date   CD4TABS 302 (L) 06/13/2021   CD4TABS 278 (L) 11/01/2020   CD4TABS 360 (L) 05/01/2020   Lab Results  Component Value Date   HIV1RNAQUANT <20 (H) 05/13/2022   Lab Results  Component Value Date   HEPBSAB INDETER (A) 08/08/2009   Lab Results  Component Value Date   LABRPR NON-REACTIVE 05/01/2020    CBC Lab Results  Component Value Date   WBC 2.7 (L) 06/05/2022   RBC 3.08 (L) 06/05/2022   HGB 9.3 (L) 06/05/2022   HCT 28.5 (L) 06/05/2022   PLT 160 06/05/2022   MCV 92.5 06/05/2022   MCH 30.2 06/05/2022   MCHC 32.6 06/05/2022   RDW 17.7 (H) 06/05/2022   LYMPHSABS 0.6 (L) 06/05/2022   MONOABS 0.5 06/05/2022   EOSABS 0.1 06/05/2022    BMET Lab Results  Component Value Date   NA 140 06/05/2022   K 3.7 06/05/2022   CL 106 06/05/2022   CO2 30 06/05/2022   GLUCOSE 104 (H) 06/05/2022   BUN 9 06/05/2022   CREATININE 0.77 06/05/2022   CALCIUM 9.3 06/05/2022   GFRNONAA >60 06/05/2022   GFRAA 73 11/01/2020      Assessment and Plan Hiv disease = She is confident to doing her pill box with  Now having improvement in taking pills down without difficulty, with improved adherence.  Leukopenia = from chemotherapy  Estimated to have CD 4 coutn < 200 = will start on liquid bactrim daily for oi proph

## 2022-06-12 ENCOUNTER — Other Ambulatory Visit (HOSPITAL_COMMUNITY): Payer: Self-pay

## 2022-06-12 MED FILL — Dexamethasone Sodium Phosphate Inj 100 MG/10ML: INTRAMUSCULAR | Qty: 1 | Status: AC

## 2022-06-13 ENCOUNTER — Inpatient Hospital Stay: Payer: Medicare HMO

## 2022-06-13 ENCOUNTER — Inpatient Hospital Stay (HOSPITAL_BASED_OUTPATIENT_CLINIC_OR_DEPARTMENT_OTHER): Payer: Medicare HMO | Admitting: Adult Health

## 2022-06-13 ENCOUNTER — Encounter: Payer: Self-pay | Admitting: Adult Health

## 2022-06-13 ENCOUNTER — Inpatient Hospital Stay: Payer: Medicare HMO | Admitting: Dietician

## 2022-06-13 ENCOUNTER — Other Ambulatory Visit: Payer: Self-pay

## 2022-06-13 VITALS — BP 118/70 | HR 81 | Temp 97.7°F | Resp 16

## 2022-06-13 DIAGNOSIS — C50412 Malignant neoplasm of upper-outer quadrant of left female breast: Secondary | ICD-10-CM

## 2022-06-13 DIAGNOSIS — Z5111 Encounter for antineoplastic chemotherapy: Secondary | ICD-10-CM | POA: Diagnosis not present

## 2022-06-13 DIAGNOSIS — Z170 Estrogen receptor positive status [ER+]: Secondary | ICD-10-CM

## 2022-06-13 LAB — CBC WITH DIFFERENTIAL (CANCER CENTER ONLY)
Abs Immature Granulocytes: 0.01 10*3/uL (ref 0.00–0.07)
Basophils Absolute: 0 10*3/uL (ref 0.0–0.1)
Basophils Relative: 1 %
Eosinophils Absolute: 0.1 10*3/uL (ref 0.0–0.5)
Eosinophils Relative: 3 %
HCT: 28.7 % — ABNORMAL LOW (ref 36.0–46.0)
Hemoglobin: 9.4 g/dL — ABNORMAL LOW (ref 12.0–15.0)
Immature Granulocytes: 0 %
Lymphocytes Relative: 25 %
Lymphs Abs: 0.8 10*3/uL (ref 0.7–4.0)
MCH: 30.1 pg (ref 26.0–34.0)
MCHC: 32.8 g/dL (ref 30.0–36.0)
MCV: 92 fL (ref 80.0–100.0)
Monocytes Absolute: 0.5 10*3/uL (ref 0.1–1.0)
Monocytes Relative: 16 %
Neutro Abs: 1.8 10*3/uL (ref 1.7–7.7)
Neutrophils Relative %: 55 %
Platelet Count: 150 10*3/uL (ref 150–400)
RBC: 3.12 MIL/uL — ABNORMAL LOW (ref 3.87–5.11)
RDW: 16.6 % — ABNORMAL HIGH (ref 11.5–15.5)
WBC Count: 3.2 10*3/uL — ABNORMAL LOW (ref 4.0–10.5)
nRBC: 0 % (ref 0.0–0.2)

## 2022-06-13 LAB — CMP (CANCER CENTER ONLY)
ALT: 24 U/L (ref 0–44)
AST: 27 U/L (ref 15–41)
Albumin: 4 g/dL (ref 3.5–5.0)
Alkaline Phosphatase: 62 U/L (ref 38–126)
Anion gap: 6 (ref 5–15)
BUN: 8 mg/dL (ref 8–23)
CO2: 28 mmol/L (ref 22–32)
Calcium: 9.4 mg/dL (ref 8.9–10.3)
Chloride: 106 mmol/L (ref 98–111)
Creatinine: 0.81 mg/dL (ref 0.44–1.00)
GFR, Estimated: >60 mL/min (ref >60)
Glucose, Bld: 122 mg/dL — ABNORMAL HIGH (ref 70–99)
Potassium: 3.7 mmol/L (ref 3.5–5.1)
Sodium: 140 mmol/L (ref 135–145)
Total Bilirubin: 0.3 mg/dL (ref 0.3–1.2)
Total Protein: 6.5 g/dL (ref 6.5–8.1)

## 2022-06-13 MED ORDER — SODIUM CHLORIDE 0.9% FLUSH
10.0000 mL | INTRAVENOUS | Status: DC | PRN
  Administered 2022-06-13: 10 mL

## 2022-06-13 MED ORDER — SODIUM CHLORIDE 0.9 % IV SOLN
10.0000 mg | Freq: Once | INTRAVENOUS | Status: AC
  Administered 2022-06-13: 10 mg via INTRAVENOUS
  Filled 2022-06-13: qty 10

## 2022-06-13 MED ORDER — SODIUM CHLORIDE 0.9% FLUSH
10.0000 mL | INTRAVENOUS | Status: AC
  Administered 2022-06-13: 10 mL via INTRAVENOUS

## 2022-06-13 MED ORDER — SODIUM CHLORIDE 0.9 % IV SOLN
50.0000 mg/m2 | Freq: Once | INTRAVENOUS | Status: AC
  Administered 2022-06-13: 78 mg via INTRAVENOUS
  Filled 2022-06-13: qty 13

## 2022-06-13 MED ORDER — FAMOTIDINE IN NACL 20-0.9 MG/50ML-% IV SOLN
20.0000 mg | Freq: Once | INTRAVENOUS | Status: AC
  Administered 2022-06-13: 20 mg via INTRAVENOUS
  Filled 2022-06-13: qty 50

## 2022-06-13 MED ORDER — HEPARIN SOD (PORK) LOCK FLUSH 100 UNIT/ML IV SOLN
500.0000 [IU] | Freq: Once | INTRAVENOUS | Status: AC | PRN
  Administered 2022-06-13: 500 [IU]

## 2022-06-13 MED ORDER — SODIUM CHLORIDE 0.9 % IV SOLN: Freq: Once | INTRAVENOUS | Status: AC

## 2022-06-13 MED ORDER — CETIRIZINE HCL 10 MG/ML IV SOLN
10.0000 mg | Freq: Once | INTRAVENOUS | Status: AC
  Administered 2022-06-13: 10 mg via INTRAVENOUS
  Filled 2022-06-13: qty 1

## 2022-06-13 NOTE — Progress Notes (Signed)
Nutrition Follow-up:  Patient with breast cancer of left breast, ER positive HER2 negative. She is receiving adjuvant dose dense AC q14d + paclitaxel q7d. Patient is under the care of Dr. Lindi Adie.   Met with patient during infusion. She reports her appetite is good. Says she is surprised that her weights had decreased today. Patient recalls 3 meals plus 3 snacks most days. Her son continues to bring her meals a few times a week. She is drinking 3-4 bottles of water as well as cranberry juice. Patient is drinking Ensure, but not regularly. Says they are filling and prefers to fill up with food. She denies nausea, vomiting, diarrhea.   Medications: reviewed   Labs: glucose 122  Anthropometrics: Weight 120 lb 4.8 oz today   11/13 - 122 lb 11/2 - 121 lb 9.6 oz  10/2 - 124 lb 6.4 pz   NUTRITION DIAGNOSIS: Food and nutrition related knowledge deficit improving  INTERVENTION:  Continue strategies for increasing calories and protein with small frequent meals/snacks Pt agreeable to drink one Ensure Plus/equivalent daily at bedtime    MONITORING, EVALUATION, GOAL: weight trends, intake   NEXT VISIT: Friday December 15 during infusion

## 2022-06-13 NOTE — Progress Notes (Signed)
[facility] Cancer Follow up:    Georganna Skeans, MD [phone] [address] [place] [address]   DIAGNOSIS:  Cancer Staging  Malignant neoplasm of upper-outer quadrant of left breast in female, estrogen receptor positive (HCC) Staging form: Breast, AJCC 8th Edition - Clinical stage from 03/10/2022: Stage IIA (cT1b, cN1(f), cM0, G2, ER+, PR-, HER2-) - Signed by Ronny Bacon, PA-C on 03/10/2022 Stage prefix: Initial diagnosis Method of lymph node assessment: Core biopsy Histologic grading system: 3 grade system   SUMMARY OF ONCOLOGIC HISTORY: Oncology History  Malignant neoplasm of upper-outer quadrant of left breast in female, estrogen receptor positive (HCC)  02/28/2022 Initial Diagnosis   Screening detected left breast mass at 3 o'clock position measuring 0.9 cm, 4 enlarged left axillary lymph nodes along with asymmetric ducts between mass and the nipple possibly DCIS; lymph node biopsy: Positive, breast biopsy: Grade 2 IDC ER 100%, PR 0%, HER2 negative, Ki-67 10%   03/10/2022 Cancer Staging   Staging form: Breast, AJCC 8th Edition - Clinical stage from 03/10/2022: Stage IIA (cT1b, cN1(f), cM0, G2, ER+, PR-, HER2-) - Signed by Ronny Bacon, PA-C on 03/10/2022 Stage prefix: Initial diagnosis Method of lymph node assessment: Core biopsy Histologic grading system: 3 grade system   03/27/2022 -  Chemotherapy   Patient is on Treatment Plan : BREAST ADJUVANT DOSE DENSE AC q14d / PACLitaxel q7d       CURRENT THERAPY: Neoadjuvant chemotherapy with Taxol  INTERVAL HISTORY: VINCENTA SZOKE 66 y.o. female returns for follow-up prior to receiving week 4 of neoadjuvant Taxol chemotherapy.  She is receiving this weekly at a dose reduction due to neutropenia.  Her neutrophils today are 1.8.  She says her breast mass is softer and smaller, she denies any recent increase in size.    Caycee is tolerating treatment well.  She denies peripheral neuropathy.  She recently  had a visit with Dr. Drue Second and was started on liquid sulfa.  She is taking this as prescribed and has no difficulty tolerating it.    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  MEDICAL HISTORY: 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   Family history of breast cancer 03/20/2022   Family history of pancreatic cancer 03/20/2022   HIV infection (HCC) 1993   Hypertension     SURGICAL HISTORY: Past Surgical History:  Procedure Laterality Date   ABDOMINAL HYSTERECTOMY     APPENDECTOMY     BREAST BIOPSY Right 2015   CYSTOSCOPY/RETROGRADE/URETEROSCOPY Right 11/14/2016   Procedure: CYSTOSCOPY/RETROGRADE/URETEROSCOPY/ STONE EXTRACTION/HOLMIUM LASER/STENT PLACEMENT;  Surgeon: Ihor Gully, MD;  Location: [facility];  Service: Urology;  Laterality: Right;   PORTACATH PLACEMENT Right 03/26/2022   Procedure: INSERTION PORT-A-CATH;  Surgeon: Abigail Miyamoto, MD;  Location: [facility];  Service:  General;  Laterality: Right;    SOCIAL HISTORY: Social History   Socioeconomic History   Marital status: Single    Spouse name: Not on file   Number of children: Not on file   Years of education: Not on file   Highest education level: Not on file   Occupational History   Not on file  Tobacco Use   Smoking status: Former    Packs/day: 0.10    Types: Cigarettes   Smokeless tobacco: Never  Vaping Use   Vaping Use: Never used  Substance and Sexual Activity   Alcohol use: No    Alcohol/week: 0.0 standard drinks of alcohol   Drug use: No   Sexual activity: Not Currently    Partners: Male    Birth control/protection: Surgical    Comment: declined condoms  Other Topics Concern   Not on file  Social History Narrative   Not on file   Social Determinants of Health   Financial Resource Strain: Not on file  Food Insecurity: Not on file  Transportation Needs: Not on file  Physical Activity: Not on file  Stress: Not on file  Social Connections: Not on file  Intimate Partner Violence: Not on file    FAMILY HISTORY: Family History  Problem Relation Age of Onset   Hypertension Mother    Pancreatic cancer Father 73   Breast cancer Sister        dx 56s   Lung cancer Sister        d. 74    Review of Systems  Constitutional:  Positive for fatigue (mild). Negative for appetite change, chills, fever and unexpected weight change.  HENT:   Negative for hearing loss, lump/mass and trouble swallowing.   Eyes:  Negative for eye problems and icterus.  Respiratory:  Negative for chest tightness, cough and shortness of breath.   Cardiovascular:  Negative for chest pain, leg swelling and palpitations.  Gastrointestinal:  Negative for abdominal distention, abdominal pain, constipation, diarrhea, nausea and vomiting.  Endocrine: Negative for hot flashes.  Genitourinary:  Negative for difficulty urinating.   Musculoskeletal:  Negative for arthralgias.  Skin:  Negative for itching and rash.  Neurological:  Negative for dizziness, extremity weakness, headaches and numbness.  Hematological:  Negative for adenopathy. Does not bruise/bleed easily.  Psychiatric/Behavioral:  Negative for depression. The patient is not nervous/anxious.        PHYSICAL EXAMINATION  ECOG PERFORMANCE STATUS: 1 - Symptomatic but completely ambulatory  Vitals:   06/13/22 0843  BP: 123/68  Pulse: 78  Resp: 16  Temp: (!) 97.2 F (36.2 C)  SpO2: 100%    Physical Exam Constitutional:      General: She is not in acute distress.    Appearance: Normal appearance. She is not toxic-appearing.  HENT:     Head: Normocephalic and atraumatic.  Eyes:     General: No scleral icterus. Cardiovascular:     Rate and Rhythm: Normal rate and regular rhythm.     Pulses: Normal pulses.     Heart sounds: Normal heart sounds.  Pulmonary:     Effort: Pulmonary effort is normal.     Breath sounds: Normal breath sounds.  Abdominal:     General: Abdomen is flat. Bowel sounds are normal. There is no distension.     Palpations: Abdomen is soft.     Tenderness: There is no abdominal tenderness.  Musculoskeletal:        General: No swelling.     Cervical back: Neck supple.  Lymphadenopathy:     Cervical: No cervical adenopathy.  Skin:    General: Skin is warm and dry.     Findings: No rash.  Neurological:     General: No focal deficit present.     Mental Status: She is alert.  Psychiatric:        Mood and Affect: Mood normal.        Behavior: Behavior normal.     LABORATORY DATA:  CBC    Component Value Date/Time   WBC 3.2 (L) 06/13/2022 0836   WBC 5.4 06/13/2021 1443   RBC 3.12 (L) 06/13/2022 0836   HGB 9.4 (L) 06/13/2022 0836   HCT 28.7 (L) 06/13/2022 0836   PLT 150 06/13/2022 0836   MCV 92.0 06/13/2022 0836   MCH 30.1 06/13/2022 0836   MCHC 32.8 06/13/2022 0836   RDW 16.6 (H) 06/13/2022 0836   LYMPHSABS 0.8 06/13/2022 0836   MONOABS 0.5 06/13/2022 0836   EOSABS 0.1 06/13/2022 0836   BASOSABS 0.0 06/13/2022 0836    CMP     Component Value Date/Time   NA 140 06/13/2022 0836   K 3.7 06/13/2022 0836   CL 106 06/13/2022 0836   CO2 28 06/13/2022 0836   GLUCOSE 122 (H) 06/13/2022 0836   BUN 8 06/13/2022 0836   CREATININE 0.81  06/13/2022 0836   CREATININE 1.02 08/29/2021 1408   CALCIUM 9.4 06/13/2022 0836   PROT 6.5 06/13/2022 0836   ALBUMIN 4.0 06/13/2022 0836   AST 27 06/13/2022 0836   ALT 24 06/13/2022 0836   ALKPHOS 62 06/13/2022 0836   BILITOT 0.3 06/13/2022 0836   GFRNONAA >60 06/13/2022 0836   GFRNONAA 63 11/01/2020 1414   GFRAA 73 11/01/2020 1414         ASSESSMENT and THERAPY PLAN:   Malignant neoplasm of upper-outer quadrant of left breast in female, estrogen receptor positive (HCC) Alyea is a 66 year old woman with h/o stage IIA ER positive breast cancer diagnosed in 02/2022 receiving neoadjuvant chemotherapy.  She is s/p neoadjuvant four cycles of adriamycin/cytoxan and is now receiving weekly taxol.  She is currently here to receive cycle 4 of her Taxol therapy which is dose reduced secondary to neutropenia.  Bobbiesue is tolerating her treatment well and will continue to receive it weekly at her dose reduction secondary to neutropenia.  We will continue to monitor her closely for neuropathy.    She will return weekly for treatment and every other week for f/u with Dr. Pamelia Hoit or myself.     All questions were answered. The patient knows to call the clinic with any problems, questions or concerns. We can certainly see the patient much sooner if necessary.  Total encounter time:30 minutes*in face-to-face visit time, chart review, lab review, care coordination, order entry, and documentation of the encounter time.    Lillard Anes, NP 06/15/22 6:32 PM Medical Oncology and Hematology [facility] [address] [place], [place] [address] Tel. [phone]    Fax. [phone]  *Total Encounter Time as defined by the Centers for Medicare and Medicaid Services includes, in addition to the face-to-face time of a patient visit (documented in the note above) non-face-to-face time: obtaining and reviewing outside history, ordering and reviewing medications, tests or procedures, care  coordination (communications with other health care professionals or caregivers) and documentation in the medical record.

## 2022-06-13 NOTE — Patient Instructions (Signed)
[facility]   Discharge Instructions: Thank you for choosing [facility] to provide your oncology and hematology care.   If you have a lab appointment with the [facility], please go directly to the [facility] and check in at the registration area.   Wear comfortable clothing and clothing appropriate for easy access to any Portacath or PICC line.   We strive to give you quality time with your provider. You may need to reschedule your appointment if you arrive late (15 or more minutes).  Arriving late affects you and other patients whose appointments are after yours.  Also, if you miss three or more appointments without notifying the office, you may be dismissed from the clinic at the provider's discretion.      For prescription refill requests, have your pharmacy contact our office and allow 72 hours for refills to be completed.    Today you received the following chemotherapy and/or immunotherapy agents: Paclitaxel (Taxol)      To help prevent nausea and vomiting after your treatment, we encourage you to take your nausea medication as directed.  BELOW ARE SYMPTOMS THAT SHOULD BE REPORTED IMMEDIATELY: *FEVER GREATER THAN 100.4 F (38 C) OR HIGHER *CHILLS OR SWEATING *NAUSEA AND VOMITING THAT IS NOT CONTROLLED WITH YOUR NAUSEA MEDICATION *UNUSUAL SHORTNESS OF BREATH *UNUSUAL BRUISING OR BLEEDING *URINARY PROBLEMS (pain or burning when urinating, or frequent urination) *BOWEL PROBLEMS (unusual diarrhea, constipation, pain near the anus) TENDERNESS IN MOUTH AND THROAT WITH OR WITHOUT PRESENCE OF ULCERS (sore throat, sores in mouth, or a toothache) UNUSUAL RASH, SWELLING OR PAIN  UNUSUAL VAGINAL DISCHARGE OR ITCHING   Items with * indicate a potential emergency and should be followed up as soon as possible or go to the Emergency Department if any problems should occur.  Please show the CHEMOTHERAPY ALERT CARD or IMMUNOTHERAPY ALERT CARD at  check-in to the Emergency Department and triage nurse.  Should you have questions after your visit or need to cancel or reschedule your appointment, please contact [facility]  Dept: [phone]  and follow the prompts.  Office hours are 8:00 a.m. to 4:30 p.m. Monday - Friday. Please note that voicemails left after 4:00 p.m. may not be returned until the following business day.  We are closed weekends and major holidays. You have access to a nurse at all times for urgent questions. Please call the main number to the clinic Dept: [phone] and follow the prompts.   For any non-urgent questions, you may also contact your provider using MyChart. We now offer e-Visits for anyone 12 and older to request care online for non-urgent symptoms. For details visit [email or web address].   Also download the MyChart app! Go to the app store, search "MyChart", open the app, select [facility], and log in with your MyChart username and password.  Masks are optional in the cancer centers. If you would like for your care team to wear a mask while they are taking care of you, please let them know. You may have one support person who is at least 66 years old accompany you for your appointments.

## 2022-06-15 ENCOUNTER — Encounter: Payer: Self-pay | Admitting: Hematology and Oncology

## 2022-06-15 NOTE — Assessment & Plan Note (Signed)
Veronica Robles is a 66 year old woman with h/o stage IIA ER positive breast cancer diagnosed in 02/2022 receiving neoadjuvant chemotherapy.  She is s/p neoadjuvant four cycles of adriamycin/cytoxan and is now receiving weekly taxol.  She is currently here to receive cycle 4 of her Taxol therapy which is dose reduced secondary to neutropenia.  Veronica Robles is tolerating her treatment well and will continue to receive it weekly at her dose reduction secondary to neutropenia.  We will continue to monitor her closely for neuropathy.    She will return weekly for treatment and every other week for f/u with Dr. Lindi Adie or myself.

## 2022-06-16 ENCOUNTER — Telehealth: Payer: Self-pay | Admitting: Adult Health

## 2022-06-16 NOTE — Telephone Encounter (Signed)
Scheduled appointments per WQ. Left voicemail.  

## 2022-06-20 ENCOUNTER — Inpatient Hospital Stay: Payer: Medicare HMO

## 2022-06-20 ENCOUNTER — Other Ambulatory Visit: Payer: Self-pay

## 2022-06-20 ENCOUNTER — Other Ambulatory Visit (HOSPITAL_BASED_OUTPATIENT_CLINIC_OR_DEPARTMENT_OTHER): Payer: Self-pay

## 2022-06-20 VITALS — BP 124/73 | HR 75 | Temp 97.8°F | Resp 18 | Wt 121.4 lb

## 2022-06-20 DIAGNOSIS — C50412 Malignant neoplasm of upper-outer quadrant of left female breast: Secondary | ICD-10-CM

## 2022-06-20 DIAGNOSIS — Z95828 Presence of other vascular implants and grafts: Secondary | ICD-10-CM

## 2022-06-20 DIAGNOSIS — Z5111 Encounter for antineoplastic chemotherapy: Secondary | ICD-10-CM | POA: Diagnosis not present

## 2022-06-20 LAB — CBC WITH DIFFERENTIAL (CANCER CENTER ONLY)
Abs Immature Granulocytes: 0.01 10*3/uL (ref 0.00–0.07)
Basophils Absolute: 0 10*3/uL (ref 0.0–0.1)
Basophils Relative: 1 %
Eosinophils Absolute: 0.1 10*3/uL (ref 0.0–0.5)
Eosinophils Relative: 5 %
HCT: 28.2 % — ABNORMAL LOW (ref 36.0–46.0)
Hemoglobin: 9.5 g/dL — ABNORMAL LOW (ref 12.0–15.0)
Immature Granulocytes: 0 %
Lymphocytes Relative: 28 %
Lymphs Abs: 0.8 10*3/uL (ref 0.7–4.0)
MCH: 30.8 pg (ref 26.0–34.0)
MCHC: 33.7 g/dL (ref 30.0–36.0)
MCV: 91.6 fL (ref 80.0–100.0)
Monocytes Absolute: 0.5 10*3/uL (ref 0.1–1.0)
Monocytes Relative: 16 %
Neutro Abs: 1.5 10*3/uL — ABNORMAL LOW (ref 1.7–7.7)
Neutrophils Relative %: 50 %
Platelet Count: 152 10*3/uL (ref 150–400)
RBC: 3.08 MIL/uL — ABNORMAL LOW (ref 3.87–5.11)
RDW: 15.5 % (ref 11.5–15.5)
WBC Count: 2.9 10*3/uL — ABNORMAL LOW (ref 4.0–10.5)
nRBC: 0 % (ref 0.0–0.2)

## 2022-06-20 LAB — CMP (CANCER CENTER ONLY)
ALT: 27 U/L (ref 0–44)
AST: 26 U/L (ref 15–41)
Albumin: 4.2 g/dL (ref 3.5–5.0)
Alkaline Phosphatase: 66 U/L (ref 38–126)
Anion gap: 5 (ref 5–15)
BUN: 9 mg/dL (ref 8–23)
CO2: 30 mmol/L (ref 22–32)
Calcium: 9.7 mg/dL (ref 8.9–10.3)
Chloride: 104 mmol/L (ref 98–111)
Creatinine: 0.85 mg/dL (ref 0.44–1.00)
GFR, Estimated: >60 mL/min (ref >60)
Glucose, Bld: 111 mg/dL — ABNORMAL HIGH (ref 70–99)
Potassium: 3.4 mmol/L — ABNORMAL LOW (ref 3.5–5.1)
Sodium: 139 mmol/L (ref 135–145)
Total Bilirubin: 0.4 mg/dL (ref 0.3–1.2)
Total Protein: 6.4 g/dL — ABNORMAL LOW (ref 6.5–8.1)

## 2022-06-20 MED ORDER — SODIUM CHLORIDE 0.9% FLUSH
10.0000 mL | INTRAVENOUS | Status: DC | PRN
  Administered 2022-06-20: 10 mL

## 2022-06-20 MED ORDER — HEPARIN SOD (PORK) LOCK FLUSH 100 UNIT/ML IV SOLN
500.0000 [IU] | Freq: Once | INTRAVENOUS | Status: AC | PRN
  Administered 2022-06-20: 500 [IU]

## 2022-06-20 MED ORDER — SODIUM CHLORIDE 0.9 % IV SOLN
10.0000 mg | Freq: Once | INTRAVENOUS | Status: AC
  Administered 2022-06-20: 10 mg via INTRAVENOUS
  Filled 2022-06-20: qty 10

## 2022-06-20 MED ORDER — CETIRIZINE HCL 10 MG/ML IV SOLN
10.0000 mg | Freq: Once | INTRAVENOUS | Status: AC
  Administered 2022-06-20: 10 mg via INTRAVENOUS
  Filled 2022-06-20: qty 1

## 2022-06-20 MED ORDER — FAMOTIDINE IN NACL 20-0.9 MG/50ML-% IV SOLN
20.0000 mg | Freq: Once | INTRAVENOUS | Status: AC
  Administered 2022-06-20: 20 mg via INTRAVENOUS
  Filled 2022-06-20: qty 50

## 2022-06-20 MED ORDER — SODIUM CHLORIDE 0.9 % IV SOLN
50.0000 mg/m2 | Freq: Once | INTRAVENOUS | Status: AC
  Administered 2022-06-20: 78 mg via INTRAVENOUS
  Filled 2022-06-20: qty 13

## 2022-06-20 MED ORDER — SODIUM CHLORIDE 0.9% FLUSH
10.0000 mL | INTRAVENOUS | Status: AC | PRN
  Administered 2022-06-20: 10 mL

## 2022-06-20 MED ORDER — SODIUM CHLORIDE 0.9 % IV SOLN: Freq: Once | INTRAVENOUS | Status: AC

## 2022-06-20 NOTE — Patient Instructions (Signed)
[facility]  Discharge Instructions: Thank you for choosing [facility] to provide your oncology and hematology care.   If you have a lab appointment with the [facility], please go directly to the [facility] and check in at the registration area.   Wear comfortable clothing and clothing appropriate for easy access to any Portacath or PICC line.   We strive to give you quality time with your provider. You may need to reschedule your appointment if you arrive late (15 or more minutes).  Arriving late affects you and other patients whose appointments are after yours.  Also, if you miss three or more appointments without notifying the office, you may be dismissed from the clinic at the provider's discretion.      For prescription refill requests, have your pharmacy contact our office and allow 72 hours for refills to be completed.    Today you received the following chemotherapy and/or immunotherapy agents: Paclitaxel.       To help prevent nausea and vomiting after your treatment, we encourage you to take your nausea medication as directed.  BELOW ARE SYMPTOMS THAT SHOULD BE REPORTED IMMEDIATELY: *FEVER GREATER THAN 100.4 F (38 C) OR HIGHER *CHILLS OR SWEATING *NAUSEA AND VOMITING THAT IS NOT CONTROLLED WITH YOUR NAUSEA MEDICATION *UNUSUAL SHORTNESS OF BREATH *UNUSUAL BRUISING OR BLEEDING *URINARY PROBLEMS (pain or burning when urinating, or frequent urination) *BOWEL PROBLEMS (unusual diarrhea, constipation, pain near the anus) TENDERNESS IN MOUTH AND THROAT WITH OR WITHOUT PRESENCE OF ULCERS (sore throat, sores in mouth, or a toothache) UNUSUAL RASH, SWELLING OR PAIN  UNUSUAL VAGINAL DISCHARGE OR ITCHING   Items with * indicate a potential emergency and should be followed up as soon as possible or go to the Emergency Department if any problems should occur.  Please show the CHEMOTHERAPY ALERT CARD or IMMUNOTHERAPY ALERT CARD at check-in  to the Emergency Department and triage nurse.  Should you have questions after your visit or need to cancel or reschedule your appointment, please contact [facility]  Dept: [phone]  and follow the prompts.  Office hours are 8:00 a.m. to 4:30 p.m. Monday - Friday. Please note that voicemails left after 4:00 p.m. may not be returned until the following business day.  We are closed weekends and major holidays. You have access to a nurse at all times for urgent questions. Please call the main number to the clinic Dept: [phone] and follow the prompts.   For any non-urgent questions, you may also contact your provider using MyChart. We now offer e-Visits for anyone 59 and older to request care online for non-urgent symptoms. For details visit [email or web address].   Also download the MyChart app! Go to the app store, search "MyChart", open the app, select [facility], and log in with your MyChart username and password.  Masks are optional in the cancer centers. If you would like for your care team to wear a mask while they are taking care of you, please let them know. You may have one support person who is at least 66 years old accompany you for your appointments.

## 2022-06-24 ENCOUNTER — Telehealth: Payer: Self-pay

## 2022-06-24 NOTE — Telephone Encounter (Signed)
Pt called back and left vm. Per patient, ok to leave detailed message. Left vm about bone density test results and plan.

## 2022-06-24 NOTE — Telephone Encounter (Signed)
-----   Message from Gardenia Phlegm, NP sent at 06/20/2022  3:14 PM EST ----- Potassium slightly low,r ecommend increased potassium in her diet  ----- Message ----- From: Buel Ream, Lab In [facility] Sent: 06/20/2022   8:55 AM EST To: Nicholas Lose, MD

## 2022-06-24 NOTE — Telephone Encounter (Signed)
Attempted to call pt to give bone density test results. Unable to speak with pt, left VM to call back.

## 2022-06-26 MED FILL — Dexamethasone Sodium Phosphate Inj 100 MG/10ML: INTRAMUSCULAR | Qty: 1 | Status: AC

## 2022-06-27 ENCOUNTER — Encounter: Payer: Self-pay | Admitting: Adult Health

## 2022-06-27 ENCOUNTER — Inpatient Hospital Stay: Payer: Medicare HMO

## 2022-06-27 ENCOUNTER — Inpatient Hospital Stay (HOSPITAL_BASED_OUTPATIENT_CLINIC_OR_DEPARTMENT_OTHER): Payer: Medicare HMO | Admitting: Adult Health

## 2022-06-27 ENCOUNTER — Other Ambulatory Visit: Payer: Self-pay

## 2022-06-27 ENCOUNTER — Inpatient Hospital Stay: Payer: Medicare HMO | Attending: Hematology and Oncology

## 2022-06-27 DIAGNOSIS — N6325 Unspecified lump in the left breast, overlapping quadrants: Secondary | ICD-10-CM | POA: Insufficient documentation

## 2022-06-27 DIAGNOSIS — K219 Gastro-esophageal reflux disease without esophagitis: Secondary | ICD-10-CM | POA: Diagnosis not present

## 2022-06-27 DIAGNOSIS — D709 Neutropenia, unspecified: Secondary | ICD-10-CM | POA: Insufficient documentation

## 2022-06-27 DIAGNOSIS — D6481 Anemia due to antineoplastic chemotherapy: Secondary | ICD-10-CM | POA: Diagnosis not present

## 2022-06-27 DIAGNOSIS — Z853 Personal history of malignant neoplasm of breast: Secondary | ICD-10-CM | POA: Insufficient documentation

## 2022-06-27 DIAGNOSIS — Z95828 Presence of other vascular implants and grafts: Secondary | ICD-10-CM

## 2022-06-27 DIAGNOSIS — Z803 Family history of malignant neoplasm of breast: Secondary | ICD-10-CM | POA: Insufficient documentation

## 2022-06-27 DIAGNOSIS — Z880 Allergy status to penicillin: Secondary | ICD-10-CM | POA: Insufficient documentation

## 2022-06-27 DIAGNOSIS — Z5111 Encounter for antineoplastic chemotherapy: Secondary | ICD-10-CM | POA: Insufficient documentation

## 2022-06-27 DIAGNOSIS — Z9049 Acquired absence of other specified parts of digestive tract: Secondary | ICD-10-CM | POA: Diagnosis not present

## 2022-06-27 DIAGNOSIS — B20 Human immunodeficiency virus [HIV] disease: Secondary | ICD-10-CM | POA: Insufficient documentation

## 2022-06-27 DIAGNOSIS — Z170 Estrogen receptor positive status [ER+]: Secondary | ICD-10-CM

## 2022-06-27 DIAGNOSIS — C50412 Malignant neoplasm of upper-outer quadrant of left female breast: Secondary | ICD-10-CM

## 2022-06-27 DIAGNOSIS — R200 Anesthesia of skin: Secondary | ICD-10-CM | POA: Diagnosis not present

## 2022-06-27 DIAGNOSIS — T451X5A Adverse effect of antineoplastic and immunosuppressive drugs, initial encounter: Secondary | ICD-10-CM | POA: Diagnosis not present

## 2022-06-27 DIAGNOSIS — R5383 Other fatigue: Secondary | ICD-10-CM | POA: Diagnosis not present

## 2022-06-27 DIAGNOSIS — Z87891 Personal history of nicotine dependence: Secondary | ICD-10-CM | POA: Insufficient documentation

## 2022-06-27 DIAGNOSIS — Z800 Family history of malignant neoplasm of digestive organs: Secondary | ICD-10-CM | POA: Insufficient documentation

## 2022-06-27 DIAGNOSIS — Z79624 Long term (current) use of inhibitors of nucleotide synthesis: Secondary | ICD-10-CM | POA: Diagnosis not present

## 2022-06-27 DIAGNOSIS — Z79899 Other long term (current) drug therapy: Secondary | ICD-10-CM | POA: Insufficient documentation

## 2022-06-27 DIAGNOSIS — N6489 Other specified disorders of breast: Secondary | ICD-10-CM | POA: Diagnosis not present

## 2022-06-27 LAB — CBC WITH DIFFERENTIAL (CANCER CENTER ONLY)
Abs Immature Granulocytes: 0.02 10*3/uL (ref 0.00–0.07)
Basophils Absolute: 0 10*3/uL (ref 0.0–0.1)
Basophils Relative: 1 %
Eosinophils Absolute: 0.1 10*3/uL (ref 0.0–0.5)
Eosinophils Relative: 2 %
HCT: 29.1 % — ABNORMAL LOW (ref 36.0–46.0)
Hemoglobin: 9.6 g/dL — ABNORMAL LOW (ref 12.0–15.0)
Immature Granulocytes: 1 %
Lymphocytes Relative: 29 %
Lymphs Abs: 0.8 10*3/uL (ref 0.7–4.0)
MCH: 30.9 pg (ref 26.0–34.0)
MCHC: 33 g/dL (ref 30.0–36.0)
MCV: 93.6 fL (ref 80.0–100.0)
Monocytes Absolute: 0.4 10*3/uL (ref 0.1–1.0)
Monocytes Relative: 14 %
Neutro Abs: 1.4 10*3/uL — ABNORMAL LOW (ref 1.7–7.7)
Neutrophils Relative %: 53 %
Platelet Count: 163 10*3/uL (ref 150–400)
RBC: 3.11 MIL/uL — ABNORMAL LOW (ref 3.87–5.11)
RDW: 15.2 % (ref 11.5–15.5)
WBC Count: 2.6 10*3/uL — ABNORMAL LOW (ref 4.0–10.5)
nRBC: 0 % (ref 0.0–0.2)

## 2022-06-27 LAB — CMP (CANCER CENTER ONLY)
ALT: 28 U/L (ref 0–44)
AST: 27 U/L (ref 15–41)
Albumin: 4.2 g/dL (ref 3.5–5.0)
Alkaline Phosphatase: 61 U/L (ref 38–126)
Anion gap: 5 (ref 5–15)
BUN: 12 mg/dL (ref 8–23)
CO2: 29 mmol/L (ref 22–32)
Calcium: 9.7 mg/dL (ref 8.9–10.3)
Chloride: 106 mmol/L (ref 98–111)
Creatinine: 0.91 mg/dL (ref 0.44–1.00)
GFR, Estimated: >60 mL/min (ref >60)
Glucose, Bld: 118 mg/dL — ABNORMAL HIGH (ref 70–99)
Potassium: 4 mmol/L (ref 3.5–5.1)
Sodium: 140 mmol/L (ref 135–145)
Total Bilirubin: 0.4 mg/dL (ref 0.3–1.2)
Total Protein: 6.5 g/dL (ref 6.5–8.1)

## 2022-06-27 MED ORDER — FAMOTIDINE IN NACL 20-0.9 MG/50ML-% IV SOLN
20.0000 mg | Freq: Once | INTRAVENOUS | Status: AC
  Administered 2022-06-27: 20 mg via INTRAVENOUS
  Filled 2022-06-27: qty 50

## 2022-06-27 MED ORDER — SODIUM CHLORIDE 0.9 % IV SOLN
50.0000 mg/m2 | Freq: Once | INTRAVENOUS | Status: AC
  Administered 2022-06-27: 78 mg via INTRAVENOUS
  Filled 2022-06-27: qty 13

## 2022-06-27 MED ORDER — SODIUM CHLORIDE 0.9 % IV SOLN: Freq: Once | INTRAVENOUS | Status: AC

## 2022-06-27 MED ORDER — SODIUM CHLORIDE 0.9 % IV SOLN
10.0000 mg | Freq: Once | INTRAVENOUS | Status: AC
  Administered 2022-06-27: 10 mg via INTRAVENOUS
  Filled 2022-06-27: qty 10

## 2022-06-27 MED ORDER — HEPARIN SOD (PORK) LOCK FLUSH 100 UNIT/ML IV SOLN
500.0000 [IU] | Freq: Once | INTRAVENOUS | Status: AC | PRN
  Administered 2022-06-27: 500 [IU]

## 2022-06-27 MED ORDER — SODIUM CHLORIDE 0.9% FLUSH
10.0000 mL | INTRAVENOUS | Status: DC | PRN
  Administered 2022-06-27: 10 mL

## 2022-06-27 MED ORDER — SODIUM CHLORIDE 0.9% FLUSH
10.0000 mL | INTRAVENOUS | Status: AC | PRN
  Administered 2022-06-27: 10 mL

## 2022-06-27 MED ORDER — CETIRIZINE HCL 10 MG/ML IV SOLN
10.0000 mg | Freq: Once | INTRAVENOUS | Status: AC
  Administered 2022-06-27: 10 mg via INTRAVENOUS
  Filled 2022-06-27: qty 1

## 2022-06-27 NOTE — Patient Instructions (Signed)
[facility]  Discharge Instructions: Thank you for choosing [facility] to provide your oncology and hematology care.   If you have a lab appointment with the [facility], please go directly to the [facility] and check in at the registration area.   Wear comfortable clothing and clothing appropriate for easy access to any Portacath or PICC line.   We strive to give you quality time with your provider. You may need to reschedule your appointment if you arrive late (15 or more minutes).  Arriving late affects you and other patients whose appointments are after yours.  Also, if you miss three or more appointments without notifying the office, you may be dismissed from the clinic at the provider's discretion.      For prescription refill requests, have your pharmacy contact our office and allow 72 hours for refills to be completed.    Today you received the following chemotherapy and/or immunotherapy agents: Paclitaxel.       To help prevent nausea and vomiting after your treatment, we encourage you to take your nausea medication as directed.  BELOW ARE SYMPTOMS THAT SHOULD BE REPORTED IMMEDIATELY: *FEVER GREATER THAN 100.4 F (38 C) OR HIGHER *CHILLS OR SWEATING *NAUSEA AND VOMITING THAT IS NOT CONTROLLED WITH YOUR NAUSEA MEDICATION *UNUSUAL SHORTNESS OF BREATH *UNUSUAL BRUISING OR BLEEDING *URINARY PROBLEMS (pain or burning when urinating, or frequent urination) *BOWEL PROBLEMS (unusual diarrhea, constipation, pain near the anus) TENDERNESS IN MOUTH AND THROAT WITH OR WITHOUT PRESENCE OF ULCERS (sore throat, sores in mouth, or a toothache) UNUSUAL RASH, SWELLING OR PAIN  UNUSUAL VAGINAL DISCHARGE OR ITCHING   Items with * indicate a potential emergency and should be followed up as soon as possible or go to the Emergency Department if any problems should occur.  Please show the CHEMOTHERAPY ALERT CARD or IMMUNOTHERAPY ALERT CARD at check-in  to the Emergency Department and triage nurse.  Should you have questions after your visit or need to cancel or reschedule your appointment, please contact [facility]  Dept: [phone]  and follow the prompts.  Office hours are 8:00 a.m. to 4:30 p.m. Monday - Friday. Please note that voicemails left after 4:00 p.m. may not be returned until the following business day.  We are closed weekends and major holidays. You have access to a nurse at all times for urgent questions. Please call the main number to the clinic Dept: [phone] and follow the prompts.   For any non-urgent questions, you may also contact your provider using MyChart. We now offer e-Visits for anyone 67 and older to request care online for non-urgent symptoms. For details visit [email or web address].   Also download the MyChart app! Go to the app store, search "MyChart", open the app, select [facility], and log in with your MyChart username and password.  Masks are optional in the cancer centers. If you would like for your care team to wear a mask while they are taking care of you, please let them know. You may have one support person who is at least 65 years old accompany you for your appointments.

## 2022-06-27 NOTE — Progress Notes (Signed)
[facility] Follow up:    [name], MD [phone] [address]   DIAGNOSIS:  Cancer Staging  Malignant neoplasm of upper-outer quadrant of left breast in female, estrogen receptor positive ([place]) Staging form: Breast, AJCC 8th Edition - Clinical stage from 03/10/2022: Stage IIA (cT1b, cN1(f), cM0, G2, ER+, PR-, HER2-) - Signed by Hayden Pedro, PA-C on 03/10/2022 Stage prefix: Initial diagnosis Method of lymph node assessment: Core biopsy Histologic grading system: 3 grade system   SUMMARY OF ONCOLOGIC HISTORY: Oncology History  Malignant neoplasm of upper-outer quadrant of left breast in female, estrogen receptor positive ([place])  02/28/2022 Initial Diagnosis   Screening detected left breast mass at 3 o'clock position measuring 0.9 cm, 4 enlarged left axillary lymph nodes along with asymmetric ducts between mass and the nipple possibly DCIS; lymph node biopsy: Positive, breast biopsy: Grade 2 IDC ER 100%, PR 0%, HER2 negative, Ki-67 10%   03/10/2022 Cancer Staging   Staging form: Breast, AJCC 8th Edition - Clinical stage from 03/10/2022: Stage IIA (cT1b, cN1(f), cM0, G2, ER+, PR-, HER2-) - Signed by Hayden Pedro, PA-C on 03/10/2022 Stage prefix: Initial diagnosis Method of lymph node assessment: Core biopsy Histologic grading system: 3 grade system   03/27/2022 -  Chemotherapy   Patient is on Treatment Plan : BREAST ADJUVANT DOSE DENSE AC q14d / PACLitaxel q7d       CURRENT THERAPY: Weekly Taxol #6  INTERVAL HISTORY: BEATA BEASON 66 y.o. female returns for follow-up and evaluation accompanied by her son prior to receiving her weekly Taxol.  She continues to tolerate treatment well.  She has no numbness or tingling in her fingertips or toes.  She denies any recent side effects or issues related to her treatment.  She notices her breast tumor is continuing to soften.  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  MEDICAL HISTORY: Past Medical History:  Diagnosis Date   Breast cancer ([place]) 02/2022   left breast IDC with DCIS   Family history of breast cancer 03/20/2022   Family history of pancreatic cancer 03/20/2022   HIV infection ([place]) 1993   Hypertension     SURGICAL HISTORY: Past Surgical History:  Procedure Laterality Date   ABDOMINAL HYSTERECTOMY     APPENDECTOMY     BREAST BIOPSY Right 2015   CYSTOSCOPY/RETROGRADE/URETEROSCOPY Right 11/14/2016   Procedure: CYSTOSCOPY/RETROGRADE/URETEROSCOPY/ STONE EXTRACTION/HOLMIUM LASER/STENT PLACEMENT;  Surgeon: Kathie Rhodes, MD;  Location: [facility];  Service: Urology;  Laterality: Right;   PORTACATH PLACEMENT Right 03/26/2022   Procedure: INSERTION PORT-A-CATH;  Surgeon: [name], MD;  Location: [facility];  Service: General;  Laterality: Right;    SOCIAL HISTORY: Social History   Socioeconomic History   Marital status: Single    Spouse name: Not on file   Number of children: Not on  file   Years of education: Not on file   Highest education level: Not on file  Occupational History   Not on file  Tobacco Use   Smoking status: Former    Packs/day: 0.10    Types: Cigarettes   Smokeless tobacco: Never  Vaping Use   Vaping Use: Never used   Substance and Sexual Activity   Alcohol use: No    Alcohol/week: 0.0 standard drinks of alcohol   Drug use: No   Sexual activity: Not Currently    Partners: Male    Birth control/protection: Surgical    Comment: declined condoms  Other Topics Concern   Not on file  Social History Narrative   Not on file   Social Determinants of Health   Financial Resource Strain: Not on file  Food Insecurity: Not on file  Transportation Needs: Not on file  Physical Activity: Not on file  Stress: Not on file  Social Connections: Not on file  Intimate Partner Violence: Not on file    FAMILY HISTORY: Family History  Problem Relation Age of Onset   Hypertension Mother    Pancreatic cancer Father 33   Breast cancer Sister        dx 52s   Lung cancer Sister        d. 12    Review of Systems  Constitutional:  Positive for fatigue (Mild). Negative for appetite change, chills, fever and unexpected weight change.  HENT:   Negative for hearing loss, lump/mass and trouble swallowing.   Eyes:  Negative for eye problems and icterus.  Respiratory:  Negative for chest tightness, cough and shortness of breath.   Cardiovascular:  Negative for chest pain, leg swelling and palpitations.  Gastrointestinal:  Negative for abdominal distention, abdominal pain, constipation, diarrhea, nausea and vomiting.  Endocrine: Negative for hot flashes.  Genitourinary:  Negative for difficulty urinating.   Musculoskeletal:  Negative for arthralgias.  Skin:  Negative for itching and rash.  Neurological:  Negative for dizziness, extremity weakness, headaches and numbness.  Hematological:  Negative for adenopathy. Does not bruise/bleed easily.  Psychiatric/Behavioral:  Negative for depression. The patient is not nervous/anxious.       PHYSICAL EXAMINATION  ECOG PERFORMANCE STATUS: 1 - Symptomatic but completely ambulatory  Vitals:   06/27/22 0843  BP: 128/72  Pulse: 91  Resp: 18  Temp: 97.8 F (36.6 C)   SpO2: 100%    Physical Exam Constitutional:      General: She is not in acute distress.    Appearance: Normal appearance. She is not toxic-appearing.  HENT:     Head: Normocephalic and atraumatic.  Eyes:     General: No scleral icterus. Cardiovascular:     Rate and Rhythm: Normal rate and regular rhythm.     Pulses: Normal pulses.     Heart sounds: Normal heart sounds.  Pulmonary:     Effort: Pulmonary effort is normal.     Breath sounds: Normal breath sounds.  Chest:     Comments: Difficult to palpate left breast/axillary nodule. Abdominal:     General: Abdomen is flat. Bowel sounds are normal. There is no distension.     Palpations: Abdomen is soft.     Tenderness: There is no abdominal tenderness.  Musculoskeletal:        General: No swelling.     Cervical back: Neck supple.  Lymphadenopathy:     Cervical: No cervical adenopathy.  Skin:    General: Skin is warm and dry.  Findings: No rash.  Neurological:     General: No focal deficit present.     Mental Status: She is alert.  Psychiatric:        Mood and Affect: Mood normal.        Behavior: Behavior normal.     LABORATORY DATA:  CBC    Component Value Date/Time   WBC 2.6 (L) 06/27/2022 0830   WBC 5.4 06/13/2021 1443   RBC 3.11 (L) 06/27/2022 0830   HGB 9.6 (L) 06/27/2022 0830   HCT 29.1 (L) 06/27/2022 0830   PLT 163 06/27/2022 0830   MCV 93.6 06/27/2022 0830   MCH 30.9 06/27/2022 0830   MCHC 33.0 06/27/2022 0830   RDW 15.2 06/27/2022 0830   LYMPHSABS 0.8 06/27/2022 0830   MONOABS 0.4 06/27/2022 0830   EOSABS 0.1 06/27/2022 0830   BASOSABS 0.0 06/27/2022 0830    CMP     Component Value Date/Time   NA 140 06/27/2022 0830   K 4.0 06/27/2022 0830   CL 106 06/27/2022 0830   CO2 29 06/27/2022 0830   GLUCOSE 118 (H) 06/27/2022 0830   BUN 12 06/27/2022 0830   CREATININE 0.91 06/27/2022 0830   CREATININE 1.02 08/29/2021 1408   CALCIUM 9.7 06/27/2022 0830   PROT 6.5 06/27/2022 0830   ALBUMIN  4.2 06/27/2022 0830   AST 27 06/27/2022 0830   ALT 28 06/27/2022 0830   ALKPHOS 61 06/27/2022 0830   BILITOT 0.4 06/27/2022 0830   GFRNONAA >60 06/27/2022 0830   GFRNONAA 63 11/01/2020 1414   GFRAA 73 11/01/2020 1414      ASSESSMENT and THERAPY PLAN:   Malignant neoplasm of upper-outer quadrant of left breast in female, estrogen receptor positive ([place]) Francile is a 66 year old woman with stage IIa ER positive breast cancer diagnosed in August 2023 who continues on neoadjuvant chemotherapy initially receiving 4 cycles of Adriamycin and Cytoxan now on weekly Taxol.    Today she is due for week 6 of treatment.  She will proceed with this as she is tolerating it well with no neuropathy and stable labs for treatment.  I examined her today and her tumor is very hard to palpate.  This is a positive early sign of response to the chemotherapy.  We discussed that we will continue to monitor her for peripheral neuropathy and she knows to call us between now and her weekly visits if she develops any questions or concerns.    All questions were answered. The patient knows to call the clinic with any problems, questions or concerns. We can certainly see the patient much sooner if necessary.  Total encounter time:20 minutes*in face-to-face visit time, chart review, lab review, care coordination, order entry, and documentation of the encounter time.    Wilber Bihari, NP 06/27/22 2:21 PM Medical Oncology and Hematology [facility] [place], [place] [address] Tel. [phone]    Fax. [phone]  *Total Encounter Time as defined by the Centers for Medicare and Medicaid Services includes, in addition to the face-to-face time of a patient visit (documented in the note above) non-face-to-face time: obtaining and reviewing outside history, ordering and reviewing medications, tests or procedures, care coordination (communications with other health care professionals or  caregivers) and documentation in the medical record.

## 2022-06-27 NOTE — Assessment & Plan Note (Signed)
Veronica Robles is a 66 year old woman with stage IIa ER positive breast cancer diagnosed in August 2023 who continues on neoadjuvant chemotherapy initially receiving 4 cycles of Adriamycin and Cytoxan now on weekly Taxol.    Today she is due for week 6 of treatment.  She will proceed with this as she is tolerating it well with no neuropathy and stable labs for treatment.  I examined her today and her tumor is very hard to palpate.  This is a positive early sign of response to the chemotherapy.  We discussed that we will continue to monitor her for peripheral neuropathy and she knows to call us between now and her weekly visits if she develops any questions or concerns.

## 2022-06-27 NOTE — Progress Notes (Signed)
Per Mendel Ryder, NP ok to proceed with ANC 1.4

## 2022-06-28 NOTE — Progress Notes (Signed)
Patient Care Team: [name], MD as PCP - General (Family Medicine) Carlyle Basques, MD as PCP - Infectious Diseases (Infectious Diseases) Rockwell Germany, RN as Oncology Nurse Navigator Mauro Kaufmann, RN as Oncology Nurse Navigator  DIAGNOSIS:  Encounter Diagnosis  Name Primary?   Malignant neoplasm of upper-outer quadrant of left breast in female, estrogen receptor positive ([place]) Yes    SUMMARY OF ONCOLOGIC HISTORY: Oncology History  Malignant neoplasm of upper-outer quadrant of left breast in female, estrogen receptor positive ([place])  02/28/2022 Initial Diagnosis   Screening detected left breast mass at 3 o'clock position measuring 0.9 cm, 4 enlarged left axillary lymph nodes along with asymmetric ducts between mass and the nipple possibly DCIS; lymph node biopsy: Positive, breast biopsy: Grade 2 IDC ER 100%, PR 0%, HER2 negative, Ki-67 10%   03/10/2022 Cancer Staging   Staging form: Breast, AJCC 8th Edition - Clinical stage from 03/10/2022: Stage IIA (cT1b, cN1(f), cM0, G2, ER+, PR-, HER2-) - Signed by Hayden Pedro, PA-C on 03/10/2022 Stage prefix: Initial diagnosis Method of lymph node assessment: Core biopsy Histologic grading system: 3 grade system   03/27/2022 -  Chemotherapy   Patient is on Treatment Plan : BREAST ADJUVANT DOSE DENSE AC q14d / PACLitaxel q7d       CHIEF COMPLIANT: Follow-up Taxol  INTERVAL HISTORY: Veronica Robles is a 66 year old with above-mentioned history of left breast cancer is currently on adjuvant chemotherapy and today cycle 4 of Taxol. She presents to the clinic for a follow-up. She states that the nausea and the tiredness has gotten better. She also report that the numbness and tingling has resolved. She denies dropping objects.   ALLERGIES:  is allergic to penicillins.  MEDICATIONS:  Current Outpatient Medications  Medication Sig Dispense Refill   Blood Pressure Monitoring (ADULT BLOOD PRESSURE CUFF LG) KIT        dexamethasone (DECADRON) 4 MG tablet Take 1 tablet by mouth the day after chemo and 1 tablet 2 days after chemo; take with food 8 tablet 0   emtricitabine-tenofovir AF (DESCOVY) 200-25 MG tablet Take 1 tablet by mouth daily. 30 tablet 5   fostemsavir tromethamine (RUKOBIA) 600 MG TB12 ER tablet Take 1 tablet (600 mg total) by mouth 2 (two) times daily. 60 tablet 5   hydrochlorothiazide (HYDRODIURIL) 25 MG tablet Take 1 tablet (25 mg total) by mouth daily. 90 tablet 1   lidocaine-prilocaine (EMLA) cream Apply to affected area once 30 g 3   ondansetron (ZOFRAN) 8 MG tablet Take 1 tablet (8 mg) by mouth every 8 hours as needed for nausea/vomiting. Start third day after doxorubicin/cyclophosphamide chemotherapy. 30 tablet 1   prochlorperazine (COMPAZINE) 10 MG tablet Take 1 tablet (10 mg total) by mouth every 6 (six) hours as needed for nausea or vomiting. 30 tablet 1   SQ injection lenacapavir (SUNLENCA) 463.5 MG/1.5ML SQ injection Inject 3 mLs (927 mg total) into the skin every 6 (six) months. Administer each injection subcutaneously at separate sites in the abdomen (more or equal to 2 inches from the navel). 3 mL 2   sulfamethoxazole-trimethoprim (BACTRIM) 200-40 MG/5ML suspension Take 20 mLs by mouth daily. 473 mL 5   traMADol (ULTRAM) 50 MG tablet Take 1 tablet (50 mg total) by mouth every 6 (six) hours as needed for moderate pain or severe pain. 20 tablet 0   No current facility-administered medications for this visit.   Facility-Administered Medications Ordered in Other Visits  Medication Dose Route Frequency Provider Last Rate Last Admin  ciprofloxacin (CIPRO) IVPB 400 mg  400 mg Intravenous Q12H Kathie Rhodes, MD   400 mg at 03/26/22 0728    PHYSICAL EXAMINATION: ECOG PERFORMANCE STATUS: 1 - Symptomatic but completely ambulatory  Vitals:   07/04/22 0811  BP: 126/70  Pulse: 89  Resp: 16  Temp: 97.7 F (36.5 C)  SpO2: 100%   Filed Weights   07/04/22 0811  Weight: 120 lb 14.4 oz  (54.8 kg)      LABORATORY DATA:  I have reviewed the data as listed    Latest Ref Rng & Units 06/27/2022    8:30 AM 06/20/2022    8:35 AM 06/13/2022    8:36 AM  CMP  Glucose 70 - 99 mg/dL 118  111  122   BUN 8 - 23 mg/dL _0 Creatinine 0.44 - 1.00 mg/dL 0.91  0.85  0.81   Sodium 135 - 145 mmol/L 140  139  140   Potassium 3.5 - 5.1 mmol/L 4.0  3.4  3.7   Chloride 98 - 111 mmol/L 106  104  106   CO2 22 - 32 mmol/L _1 Calcium 8.9 - 10.3 mg/dL 9.7  9.7  9.4   Total Protein 6.5 - 8.1 g/dL 6.5  6.4  6.5   Total Bilirubin 0.3 - 1.2 mg/dL 0.4  0.4  0.3   Alkaline Phos 38 - 126 U/L 61  66  62   AST 15 - 41 U/L _2 ALT 0 - 44 U/L _3 Lab Results  Component Value Date   WBC 2.6 (L) 07/04/2022   HGB 9.7 (L) 07/04/2022   HCT 29.2 (L) 07/04/2022   MCV 94.5 07/04/2022   PLT 161 07/04/2022   NEUTROABS 1.4 (L) 07/04/2022    ASSESSMENT & PLAN:  Malignant neoplasm of upper-outer quadrant of left breast in female, estrogen receptor positive ([place]) 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; lymph node biopsy: Positive, breast biopsy: Grade 2 IDC ER 100%, PR 0%, HER2 negative, Ki-67 10%   Treatment plan: 1.  Neoadjuvant chemotherapy with dose dense Adriamycin and Cytoxan x4 followed by Taxol weekly x12 2. breast conserving surgery with targeted node dissection 3.  Adjuvant radiation 4.  Follow-up antiestrogen therapy with abemaciclib --------------------------------------------------------------------------------------------------------------------------------- Current treatment: Completed 4 cycles of dose dense Adriamycin and Cytoxan, today cycle 7 Taxol   Chemotoxicities: 1.  Neutropenia: Reduced the dosage from cycle 2 to 50 mg per metered square.  Today's ANC is 1.4.  We are continuing the same  dosage. 2. Fatigue 3.  Nausea: Fairly mild: Takes intermittent Zofran 4.  Chemo-induced anemia: Hemoglobin today is 9.7: Monitoring   Monitoring closely for toxicities. Return to clinic weekly for Taxol and every other week for follow-up with me.    No orders of the defined types were placed in this encounter.  The patient has a good understanding of the overall plan. she agrees with it. she will call with any problems that may develop before the next visit here. Total time spent: 30 mins including face to face time and time spent for planning, charting and co-ordination of care   Harriette Ohara, MD 07/04/22    I Gardiner Coins am scribing for Dr. Lindi Adie  I have reviewed the above documentation  for accuracy and completeness, and I agree with the above.

## 2022-07-01 ENCOUNTER — Encounter: Payer: Self-pay | Admitting: *Deleted

## 2022-07-03 ENCOUNTER — Other Ambulatory Visit (HOSPITAL_COMMUNITY): Payer: Self-pay

## 2022-07-03 MED FILL — Dexamethasone Sodium Phosphate Inj 100 MG/10ML: INTRAMUSCULAR | Qty: 1 | Status: AC

## 2022-07-04 ENCOUNTER — Inpatient Hospital Stay: Payer: Medicare HMO

## 2022-07-04 ENCOUNTER — Inpatient Hospital Stay (HOSPITAL_BASED_OUTPATIENT_CLINIC_OR_DEPARTMENT_OTHER): Payer: Medicare HMO | Admitting: Hematology and Oncology

## 2022-07-04 ENCOUNTER — Other Ambulatory Visit: Payer: Self-pay

## 2022-07-04 VITALS — BP 126/70 | HR 89 | Temp 97.7°F | Resp 16 | Wt 120.9 lb

## 2022-07-04 DIAGNOSIS — Z170 Estrogen receptor positive status [ER+]: Secondary | ICD-10-CM | POA: Diagnosis not present

## 2022-07-04 DIAGNOSIS — C50412 Malignant neoplasm of upper-outer quadrant of left female breast: Secondary | ICD-10-CM | POA: Diagnosis not present

## 2022-07-04 DIAGNOSIS — Z5111 Encounter for antineoplastic chemotherapy: Secondary | ICD-10-CM | POA: Diagnosis not present

## 2022-07-04 DIAGNOSIS — Z95828 Presence of other vascular implants and grafts: Secondary | ICD-10-CM

## 2022-07-04 LAB — CBC WITH DIFFERENTIAL (CANCER CENTER ONLY)
Abs Immature Granulocytes: 0.01 10*3/uL (ref 0.00–0.07)
Basophils Absolute: 0 10*3/uL (ref 0.0–0.1)
Basophils Relative: 1 %
Eosinophils Absolute: 0 10*3/uL (ref 0.0–0.5)
Eosinophils Relative: 2 %
HCT: 29.2 % — ABNORMAL LOW (ref 36.0–46.0)
Hemoglobin: 9.7 g/dL — ABNORMAL LOW (ref 12.0–15.0)
Immature Granulocytes: 0 %
Lymphocytes Relative: 30 %
Lymphs Abs: 0.8 10*3/uL (ref 0.7–4.0)
MCH: 31.4 pg (ref 26.0–34.0)
MCHC: 33.2 g/dL (ref 30.0–36.0)
MCV: 94.5 fL (ref 80.0–100.0)
Monocytes Absolute: 0.3 10*3/uL (ref 0.1–1.0)
Monocytes Relative: 12 %
Neutro Abs: 1.4 10*3/uL — ABNORMAL LOW (ref 1.7–7.7)
Neutrophils Relative %: 55 %
Platelet Count: 161 10*3/uL (ref 150–400)
RBC: 3.09 MIL/uL — ABNORMAL LOW (ref 3.87–5.11)
RDW: 14.5 % (ref 11.5–15.5)
WBC Count: 2.6 10*3/uL — ABNORMAL LOW (ref 4.0–10.5)
nRBC: 0 % (ref 0.0–0.2)

## 2022-07-04 LAB — CMP (CANCER CENTER ONLY)
ALT: 26 U/L (ref 0–44)
AST: 25 U/L (ref 15–41)
Albumin: 4 g/dL (ref 3.5–5.0)
Alkaline Phosphatase: 62 U/L (ref 38–126)
Anion gap: 5 (ref 5–15)
BUN: 9 mg/dL (ref 8–23)
CO2: 27 mmol/L (ref 22–32)
Calcium: 9.7 mg/dL (ref 8.9–10.3)
Chloride: 108 mmol/L (ref 98–111)
Creatinine: 0.9 mg/dL (ref 0.44–1.00)
GFR, Estimated: >60 mL/min (ref >60)
Glucose, Bld: 119 mg/dL — ABNORMAL HIGH (ref 70–99)
Potassium: 4.2 mmol/L (ref 3.5–5.1)
Sodium: 140 mmol/L (ref 135–145)
Total Bilirubin: 0.4 mg/dL (ref 0.3–1.2)
Total Protein: 6.2 g/dL — ABNORMAL LOW (ref 6.5–8.1)

## 2022-07-04 MED ORDER — FAMOTIDINE IN NACL 20-0.9 MG/50ML-% IV SOLN
20.0000 mg | Freq: Once | INTRAVENOUS | Status: AC
  Administered 2022-07-04: 20 mg via INTRAVENOUS
  Filled 2022-07-04: qty 50

## 2022-07-04 MED ORDER — SODIUM CHLORIDE 0.9 % IV SOLN
10.0000 mg | Freq: Once | INTRAVENOUS | Status: AC
  Administered 2022-07-04: 10 mg via INTRAVENOUS
  Filled 2022-07-04: qty 10

## 2022-07-04 MED ORDER — CETIRIZINE HCL 10 MG/ML IV SOLN
10.0000 mg | Freq: Once | INTRAVENOUS | Status: AC
  Administered 2022-07-04: 10 mg via INTRAVENOUS
  Filled 2022-07-04: qty 1

## 2022-07-04 MED ORDER — SODIUM CHLORIDE 0.9% FLUSH
10.0000 mL | INTRAVENOUS | Status: AC | PRN
  Administered 2022-07-04: 10 mL

## 2022-07-04 MED ORDER — SODIUM CHLORIDE 0.9 % IV SOLN
50.0000 mg/m2 | Freq: Once | INTRAVENOUS | Status: AC
  Administered 2022-07-04: 78 mg via INTRAVENOUS
  Filled 2022-07-04: qty 13

## 2022-07-04 MED ORDER — SODIUM CHLORIDE 0.9 % IV SOLN: Freq: Once | INTRAVENOUS | Status: AC

## 2022-07-04 MED ORDER — SODIUM CHLORIDE 0.9% FLUSH: 10.0000 mL | INTRAVENOUS | Status: DC | PRN

## 2022-07-04 MED ORDER — HEPARIN SOD (PORK) LOCK FLUSH 100 UNIT/ML IV SOLN: 500.0000 [IU] | Freq: Once | INTRAVENOUS | Status: DC | PRN

## 2022-07-04 NOTE — Patient Instructions (Signed)
[facility] MEDICAL ONCOLOGY   Discharge Instructions: Thank you for choosing [facility] to provide your oncology and hematology care.   If you have a lab appointment with the Cancer Center, please go directly to the Cancer Center and check in at the registration area.   Wear comfortable clothing and clothing appropriate for easy access to any Portacath or PICC line.   We strive to give you quality time with your provider. You may need to reschedule your appointment if you arrive late (15 or more minutes).  Arriving late affects you and other patients whose appointments are after yours.  Also, if you miss three or more appointments without notifying the office, you may be dismissed from the clinic at the provider's discretion.      For prescription refill requests, have your pharmacy contact our office and allow 72 hours for refills to be completed.    Today you received the following chemotherapy and/or immunotherapy agents: paclitaxel      To help prevent nausea and vomiting after your treatment, we encourage you to take your nausea medication as directed.  BELOW ARE SYMPTOMS THAT SHOULD BE REPORTED IMMEDIATELY: *FEVER GREATER THAN 100.4 F (38 C) OR HIGHER *CHILLS OR SWEATING *NAUSEA AND VOMITING THAT IS NOT CONTROLLED WITH YOUR NAUSEA MEDICATION *UNUSUAL SHORTNESS OF BREATH *UNUSUAL BRUISING OR BLEEDING *URINARY PROBLEMS (pain or burning when urinating, or frequent urination) *BOWEL PROBLEMS (unusual diarrhea, constipation, pain near the anus) TENDERNESS IN MOUTH AND THROAT WITH OR WITHOUT PRESENCE OF ULCERS (sore throat, sores in mouth, or a toothache) UNUSUAL RASH, SWELLING OR PAIN  UNUSUAL VAGINAL DISCHARGE OR ITCHING   Items with * indicate a potential emergency and should be followed up as soon as possible or go to the Emergency Department if any problems should occur.  Please show the CHEMOTHERAPY ALERT CARD or IMMUNOTHERAPY ALERT CARD at check-in  to the Emergency Department and triage nurse.  Should you have questions after your visit or need to cancel or reschedule your appointment, please contact [facility] MEDICAL ONCOLOGY  Dept: [phone]  and follow the prompts.  Office hours are 8:00 a.m. to 4:30 p.m. Monday - Friday. Please note that voicemails left after 4:00 p.m. may not be returned until the following business day.  We are closed weekends and major holidays. You have access to a nurse at all times for urgent questions. Please call the main number to the clinic Dept: [phone] and follow the prompts.   For any non-urgent questions, you may also contact your provider using MyChart. We now offer e-Visits for anyone 18 and older to request care online for non-urgent symptoms. For details visit [email or web address].   Also download the MyChart app! Go to the app store, search "MyChart", open the app, select [facility], and log in with your MyChart username and password.  Masks are optional in the cancer centers. If you would like for your care team to wear a mask while they are taking care of you, please let them know. You may have one support person who is at least 66 years old accompany you for your appointments. 

## 2022-07-04 NOTE — Assessment & Plan Note (Addendum)
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  Neoadjuvant chemotherapy with dose dense Adriamycin and Cytoxan x4 followed by Taxol weekly x12 2. breast conserving surgery with targeted node dissection 3.  Adjuvant radiation 4.  Follow-up antiestrogen therapy with abemaciclib --------------------------------------------------------------------------------------------------------------------------------- Current treatment: Completed 4 cycles of dose dense Adriamycin and Cytoxan, today cycle 7 Taxol   Chemotoxicities: 1.  Neutropenia: Reduced the dosage from cycle 2 to 50 mg per metered square.  Today's ANC is 1.5.  We are continuing the same dosage. 2. Fatigue 3.  Nausea: Fairly mild: Takes intermittent Zofran 4.  Chemo-induced anemia: Hemoglobin today is 9.3: Monitoring   Return to clinic weekly for Taxol and every other week for follow-up with me. We will make arrangements for radiation oncology follow-up.

## 2022-07-09 ENCOUNTER — Other Ambulatory Visit: Payer: Self-pay

## 2022-07-09 ENCOUNTER — Ambulatory Visit (INDEPENDENT_AMBULATORY_CARE_PROVIDER_SITE_OTHER): Payer: Medicare HMO | Admitting: Pharmacist

## 2022-07-09 DIAGNOSIS — B20 Human immunodeficiency virus [HIV] disease: Secondary | ICD-10-CM | POA: Diagnosis not present

## 2022-07-09 NOTE — Progress Notes (Signed)
07/09/2022  HPI: Veronica Robles is a 66 y.o. female who presents to the [facility] for HIV follow-up.  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   FOSTEMSAVIR TROMETHAMINE (RUKOBIA) 600 MG TB12 ER TABLET    Take 1 tablet (600 mg total) by mouth 2 (two) times daily.   HYDROCHLOROTHIAZIDE (HYDRODIURIL) 25 MG TABLET    Take 1 tablet (25 mg total) by mouth daily.   LIDOCAINE-PRILOCAINE (EMLA) CREAM    Apply to affected area once   ONDANSETRON (ZOFRAN) 8 MG TABLET    Take 1 tablet (8 mg) by mouth every 8 hours as needed for nausea/vomiting. Start third day after doxorubicin/cyclophosphamide chemotherapy.   PROCHLORPERAZINE (COMPAZINE) 10 MG TABLET    Take 1 tablet (10 mg total) by mouth every 6 (six) hours as needed for nausea or vomiting.   SQ INJECTION LENACAPAVIR (SUNLENCA) 463.5 MG/1.5ML SQ INJECTION    Inject 3 mLs (927 mg total) into the skin  every 6 (six) months. Administer each injection subcutaneously at separate sites in the abdomen (more or equal to 2 inches from the navel).   SULFAMETHOXAZOLE-TRIMETHOPRIM (BACTRIM) 200-40 MG/5ML SUSPENSION    Take 20 mLs by mouth daily.   TRAMADOL (ULTRAM) 50 MG TABLET    Take 1 tablet (50 mg total) by mouth every 6 (six) hours as needed for moderate pain or severe pain.  Modified Medications   No medications on file  Discontinued Medications   No medications on file    Allergies: Allergies  Allergen Reactions   Penicillins Rash    Has patient had a PCN reaction causing immediate rash, facial/tongue/throat swelling, SOB or lightheadedness with hypotension: No Has patient had a PCN reaction causing severe rash involving mucus membranes or skin necrosis: No Has patient had a PCN reaction that required hospitalization No Has patient had a PCN reaction occurring within the last 10 years: No If all of the above answers are "NO", then may proceed with Cephalosporin use.     Past Medical History: Past Medical History:  Diagnosis Date   Breast cancer ([place]) 02/2022   left breast IDC with DCIS   Family history of breast cancer 03/20/2022   Family history of pancreatic cancer 03/20/2022   HIV infection ([place]) 1993   Hypertension     Social History: Social History   Socioeconomic History   Marital status: Single    Spouse name: Not on file  Number of children: Not on file   Years of education: Not on file   Highest education level: Not on file  Occupational History   Not on file  Tobacco Use   Smoking status: Former    Packs/day: 0.10    Types: Cigarettes   Smokeless tobacco: Never  Vaping Use   Vaping Use: Never used  Substance and Sexual Activity   Alcohol use: No    Alcohol/week: 0.0 standard drinks of alcohol   Drug use: No   Sexual activity: Not Currently    Partners: Male    Birth control/protection: Surgical    Comment: declined condoms  Other Topics Concern   Not  on file  Social History Narrative   Not on file   Social Determinants of Health   Financial Resource Strain: Not on file  Food Insecurity: Not on file  Transportation Needs: Not on file  Physical Activity: Not on file  Stress: Not on file  Social Connections: Not on file    Labs: Lab Results  Component Value Date   HIV1RNAQUANT <20 (H) 05/13/2022   HIV1RNAQUANT 52 (H) 04/29/2022   HIV1RNAQUANT 2,880 (H) 03/21/2022   CD4TABS 302 (L) 06/13/2021   CD4TABS 278 (L) 11/01/2020   CD4TABS 360 (L) 05/01/2020    RPR and STI Lab Results  Component Value Date   LABRPR NON-REACTIVE 05/01/2020   LABRPR NON-REACTIVE 05/10/2018   LABRPR NON REAC 10/30/2015   LABRPR NON REAC 05/24/2015   LABRPR NON REAC 12/05/2013    STI Results GC CT  05/01/2020  2:25 PM Negative  Negative   03/04/2016 12:00 AM Negative  Negative   12/05/2013 12:00 AM  CT: Negative     Hepatitis B Lab Results  Component Value Date   HEPBSAB INDETER (A) 08/08/2009   HEPBSAG NEG 08/08/2009   HEPBCAB POS (A) 08/08/2009   Hepatitis C No results found for: "HEPCAB", "HCVRNAPCRQN" Hepatitis A Lab Results  Component Value Date   HAV POS (A) 08/08/2009   Lipids: Lab Results  Component Value Date   CHOL 203 (H) 01/04/2020   TRIG 133 01/04/2020   HDL 51 01/04/2020   CHOLHDL 4.0 01/04/2020   VLDL 37 (H) 10/30/2015   LDLCALC 127 (H) 01/04/2020    Current HIV Regimen: [name], Descovy  Assessment: Veronica Robles is here today for her 4 week follow up for her HIV infection and medication adherence. She is doing well on the current treatment we have her on and has only missed one evening dose since she was last here. She reports no side effects. She has 4 weeks left of chemotherapy and then will be undergoing surgery. She believes she will have radiation after surgery. She is tired but doing ok overall.  She does complain of a sore between her butt cheeks. I examined and did not think it looked infected but  wanted to get a provider's opinion. Dr. Gale Journey came in and examined the area. He thinks it is poor wound healing and from sitting a lot during chemotherapy and in the evenings. Asked her to put Vaseline on the wound to keep it protected and to let us know if it worsens. She agrees with the plan.  I continued to tell her how proud we are of her and the fact that she is undetectable. She was tearful during the appointment and is glad she is undetectable too. She is genuinely considering telling her son about her status. She declined getting labs drawn today so will defer those until  she sees Dr. Baxter Flattery next month. She would like to continue with monthly follow ups. Encouraged her to stay on top of her refills at [facility] and call them every month to get them ready about 5-6 days before she runs out. She will be out on Saturday, so she is going to stop by today after this appointment.   She is due for her next Sunlenca injections in March.  Plan: - Continue Rukobia and Descovy - Sunlenca injections again in March - F/u with Dr. Baxter Flattery 08/12/22 at 3pm - F/u with me on 09/17/22 at 230pm  Veronica Robles, PharmD, BCIDP, AAHIVP, CPP Clinical Pharmacist Practitioner Infectious Diseases [facility] 07/09/2022, 3:15 PM

## 2022-07-10 MED FILL — Dexamethasone Sodium Phosphate Inj 100 MG/10ML: INTRAMUSCULAR | Qty: 1 | Status: AC

## 2022-07-10 NOTE — Progress Notes (Signed)
Patient Care Team: [name], MD as PCP - General (Family Medicine) Carlyle Basques, MD as PCP - Infectious Diseases (Infectious Diseases) Rockwell Germany, RN as Oncology Nurse Navigator Mauro Kaufmann, RN as Oncology Nurse Navigator  DIAGNOSIS:  Encounter Diagnosis  Name Primary?   Malignant neoplasm of upper-outer quadrant of left breast in female, estrogen receptor positive ([place]) Yes    SUMMARY OF ONCOLOGIC HISTORY: Oncology History  Malignant neoplasm of upper-outer quadrant of left breast in female, estrogen receptor positive ([place])  02/28/2022 Initial Diagnosis   Screening detected left breast mass at 3 o'clock position measuring 0.9 cm, 4 enlarged left axillary lymph nodes along with asymmetric ducts between mass and the nipple possibly DCIS; lymph node biopsy: Positive, breast biopsy: Grade 2 IDC ER 100%, PR 0%, HER2 negative, Ki-67 10%   03/10/2022 Cancer Staging   Staging form: Breast, AJCC 8th Edition - Clinical stage from 03/10/2022: Stage IIA (cT1b, cN1(f), cM0, G2, ER+, PR-, HER2-) - Signed by Hayden Pedro, PA-C on 03/10/2022 Stage prefix: Initial diagnosis Method of lymph node assessment: Core biopsy Histologic grading system: 3 grade system   03/27/2022 -  Chemotherapy   Patient is on Treatment Plan : BREAST ADJUVANT DOSE DENSE AC q14d / PACLitaxel q7d       CHIEF COMPLIANT:  Follow-up Taxol cycle 8    INTERVAL HISTORY: Veronica Robles is a 66 year old with above-mentioned history of left breast cancer is currently on adjuvant chemotherapy Taxol. She presents to the clinic for a follow-up and treatment. She reports that treatment was good. She says she noticed a little numbness in tips of fingers and in tips of toes and she said is not bad and it doesn't last long. She say taste is good She denies losing any weight.   ALLERGIES:  is allergic to penicillins.  MEDICATIONS:  Current Outpatient Medications  Medication Sig Dispense Refill   Blood  Pressure Monitoring (ADULT BLOOD PRESSURE CUFF LG) KIT       emtricitabine-tenofovir AF (DESCOVY) 200-25 MG tablet Take 1 tablet by mouth daily. 30 tablet 5   fostemsavir tromethamine (RUKOBIA) 600 MG TB12 ER tablet Take 1 tablet (600 mg total) by mouth 2 (two) times daily. 60 tablet 5   hydrochlorothiazide (HYDRODIURIL) 25 MG tablet Take 1 tablet (25 mg total) by mouth daily. 90 tablet 1   lidocaine-prilocaine (EMLA) cream Apply to affected area once 30 g 3   ondansetron (ZOFRAN) 8 MG tablet Take 1 tablet (8 mg) by mouth every 8 hours as needed for nausea/vomiting. Start third day after doxorubicin/cyclophosphamide chemotherapy. 30 tablet 1   prochlorperazine (COMPAZINE) 10 MG tablet Take 1 tablet (10 mg total) by mouth every 6 (six) hours as needed for nausea or vomiting. 30 tablet 1   SQ injection lenacapavir (SUNLENCA) 463.5 MG/1.5ML SQ injection Inject 3 mLs (927 mg total) into the skin every 6 (six) months. Administer each injection subcutaneously at separate sites in the abdomen (more or equal to 2 inches from the navel). 3 mL 2   sulfamethoxazole-trimethoprim (BACTRIM) 200-40 MG/5ML suspension Take 20 mLs by mouth daily. 473 mL 5   No current facility-administered medications for this visit.   Facility-Administered Medications Ordered in Other Visits  Medication Dose Route Frequency Provider Last Rate Last Admin   ciprofloxacin (CIPRO) IVPB 400 mg  400 mg Intravenous Q12H Kathie Rhodes, MD   400 mg at 03/26/22 0076    PHYSICAL EXAMINATION: ECOG PERFORMANCE STATUS: 1 - Symptomatic but completely ambulatory  Vitals:  07/11/22 0847  BP: 128/71  Pulse: 89  Resp: 18  Temp: 97.8 F (36.6 C)  SpO2: 100%   Filed Weights   07/11/22 0847  Weight: 121 lb 11.2 oz (55.2 kg)      LABORATORY DATA:  I have reviewed the data as listed    Latest Ref Rng & Units 07/04/2022    7:54 AM 06/27/2022    8:30 AM 06/20/2022    8:35 AM  CMP  Glucose 70 - 99 mg/dL 119  118  111   BUN 8 - 23  mg/dL _0 Creatinine 0.44 - 1.00 mg/dL 0.90  0.91  0.85   Sodium 135 - 145 mmol/L 140  140  139   Potassium 3.5 - 5.1 mmol/L 4.2  4.0  3.4   Chloride 98 - 111 mmol/L 108  106  104   CO2 22 - 32 mmol/L _1 Calcium 8.9 - 10.3 mg/dL 9.7  9.7  9.7   Total Protein 6.5 - 8.1 g/dL 6.2  6.5  6.4   Total Bilirubin 0.3 - 1.2 mg/dL 0.4  0.4  0.4   Alkaline Phos 38 - 126 U/L 62  61  66   AST 15 - 41 U/L _2 ALT 0 - 44 U/L _3 Lab Results  Component Value Date   WBC 2.5 (L) 07/11/2022   HGB 9.5 (L) 07/11/2022   HCT 29.1 (L) 07/11/2022   MCV 94.5 07/11/2022   PLT 151 07/11/2022   NEUTROABS 1.4 (L) 07/11/2022    ASSESSMENT & PLAN:  Malignant neoplasm of upper-outer quadrant of left breast in female, estrogen receptor positive ([place]) 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; lymph node biopsy: Positive, breast biopsy: Grade 2 IDC ER 100%, PR 0%, HER2 negative, Ki-67 10%   Treatment plan: 1.  Neoadjuvant chemotherapy with dose dense Adriamycin and Cytoxan x4 followed by Taxol weekly x12 2. breast conserving surgery with targeted node dissection 3.  Adjuvant radiation 4.  Follow-up antiestrogen therapy with abemaciclib --------------------------------------------------------------------------------------------------------------------------------- Current treatment: Completed 4 cycles of dose dense Adriamycin and Cytoxan, today cycle 8 Taxol   Chemotoxicities: 1.  Neutropenia: Reduced the dosage from cycle 2 to 50 mg per metered square.  Today's ANC is 1.4.  We are continuing the same dosage. 2. Fatigue 3.  Nausea: Fairly mild: Takes intermittent Zofran 4.  Chemo-induced anemia: Hemoglobin today is 9.5: Monitoring   Monitoring closely for toxicities. Return to clinic weekly for Taxol      No orders of the  defined types were placed in this encounter.  The patient has a good understanding of the overall plan. she agrees with it. she will call with any problems that may develop before the next visit here. Total time spent: 30 mins including face to face time and time spent for planning, charting and co-ordination of care   Veronica Ohara, MD 07/11/22    I Gardiner Coins am acting as a Education administrator for [employer]  I have reviewed the above documentation for accuracy and completeness, and I agree with the above.

## 2022-07-11 ENCOUNTER — Inpatient Hospital Stay: Payer: Medicare HMO | Admitting: Dietician

## 2022-07-11 ENCOUNTER — Inpatient Hospital Stay: Payer: Medicare HMO

## 2022-07-11 ENCOUNTER — Inpatient Hospital Stay (HOSPITAL_BASED_OUTPATIENT_CLINIC_OR_DEPARTMENT_OTHER): Payer: Medicare HMO | Admitting: Hematology and Oncology

## 2022-07-11 ENCOUNTER — Other Ambulatory Visit: Payer: Self-pay

## 2022-07-11 VITALS — BP 128/71 | HR 89 | Temp 97.8°F | Resp 18 | Ht 62.0 in | Wt 121.7 lb

## 2022-07-11 VITALS — BP 137/81 | HR 83 | Temp 98.9°F | Resp 16

## 2022-07-11 DIAGNOSIS — Z95828 Presence of other vascular implants and grafts: Secondary | ICD-10-CM

## 2022-07-11 DIAGNOSIS — C50412 Malignant neoplasm of upper-outer quadrant of left female breast: Secondary | ICD-10-CM

## 2022-07-11 DIAGNOSIS — Z170 Estrogen receptor positive status [ER+]: Secondary | ICD-10-CM | POA: Diagnosis not present

## 2022-07-11 DIAGNOSIS — Z5111 Encounter for antineoplastic chemotherapy: Secondary | ICD-10-CM | POA: Diagnosis not present

## 2022-07-11 LAB — CBC WITH DIFFERENTIAL (CANCER CENTER ONLY)
Abs Immature Granulocytes: 0.01 10*3/uL (ref 0.00–0.07)
Basophils Absolute: 0 10*3/uL (ref 0.0–0.1)
Basophils Relative: 0 %
Eosinophils Absolute: 0.1 10*3/uL (ref 0.0–0.5)
Eosinophils Relative: 2 %
HCT: 29.1 % — ABNORMAL LOW (ref 36.0–46.0)
Hemoglobin: 9.5 g/dL — ABNORMAL LOW (ref 12.0–15.0)
Immature Granulocytes: 0 %
Lymphocytes Relative: 31 %
Lymphs Abs: 0.8 10*3/uL (ref 0.7–4.0)
MCH: 30.8 pg (ref 26.0–34.0)
MCHC: 32.6 g/dL (ref 30.0–36.0)
MCV: 94.5 fL (ref 80.0–100.0)
Monocytes Absolute: 0.3 10*3/uL (ref 0.1–1.0)
Monocytes Relative: 11 %
Neutro Abs: 1.4 10*3/uL — ABNORMAL LOW (ref 1.7–7.7)
Neutrophils Relative %: 56 %
Platelet Count: 151 10*3/uL (ref 150–400)
RBC: 3.08 MIL/uL — ABNORMAL LOW (ref 3.87–5.11)
RDW: 14 % (ref 11.5–15.5)
WBC Count: 2.5 10*3/uL — ABNORMAL LOW (ref 4.0–10.5)
nRBC: 0 % (ref 0.0–0.2)

## 2022-07-11 LAB — CMP (CANCER CENTER ONLY)
ALT: 23 U/L (ref 0–44)
AST: 22 U/L (ref 15–41)
Albumin: 4 g/dL (ref 3.5–5.0)
Alkaline Phosphatase: 59 U/L (ref 38–126)
Anion gap: 4 — ABNORMAL LOW (ref 5–15)
BUN: 9 mg/dL (ref 8–23)
CO2: 29 mmol/L (ref 22–32)
Calcium: 9.6 mg/dL (ref 8.9–10.3)
Chloride: 108 mmol/L (ref 98–111)
Creatinine: 0.88 mg/dL (ref 0.44–1.00)
GFR, Estimated: >60 mL/min (ref >60)
Glucose, Bld: 123 mg/dL — ABNORMAL HIGH (ref 70–99)
Potassium: 3.7 mmol/L (ref 3.5–5.1)
Sodium: 141 mmol/L (ref 135–145)
Total Bilirubin: 0.3 mg/dL (ref 0.3–1.2)
Total Protein: 5.9 g/dL — ABNORMAL LOW (ref 6.5–8.1)

## 2022-07-11 MED ORDER — HEPARIN SOD (PORK) LOCK FLUSH 100 UNIT/ML IV SOLN
500.0000 [IU] | Freq: Once | INTRAVENOUS | Status: AC | PRN
  Administered 2022-07-11: 500 [IU]

## 2022-07-11 MED ORDER — SODIUM CHLORIDE 0.9% FLUSH
10.0000 mL | INTRAVENOUS | Status: AC | PRN
  Administered 2022-07-11: 10 mL

## 2022-07-11 MED ORDER — SODIUM CHLORIDE 0.9% FLUSH
10.0000 mL | INTRAVENOUS | Status: DC | PRN
  Administered 2022-07-11: 10 mL

## 2022-07-11 MED ORDER — CETIRIZINE HCL 10 MG/ML IV SOLN
10.0000 mg | Freq: Once | INTRAVENOUS | Status: AC
  Administered 2022-07-11: 10 mg via INTRAVENOUS
  Filled 2022-07-11: qty 1

## 2022-07-11 MED ORDER — FAMOTIDINE IN NACL 20-0.9 MG/50ML-% IV SOLN
20.0000 mg | Freq: Once | INTRAVENOUS | Status: AC
  Administered 2022-07-11: 20 mg via INTRAVENOUS
  Filled 2022-07-11: qty 50

## 2022-07-11 MED ORDER — SODIUM CHLORIDE 0.9 % IV SOLN: Freq: Once | INTRAVENOUS | Status: AC

## 2022-07-11 MED ORDER — SODIUM CHLORIDE 0.9 % IV SOLN
10.0000 mg | Freq: Once | INTRAVENOUS | Status: AC
  Administered 2022-07-11: 10 mg via INTRAVENOUS
  Filled 2022-07-11: qty 10

## 2022-07-11 MED ORDER — SODIUM CHLORIDE 0.9 % IV SOLN
50.0000 mg/m2 | Freq: Once | INTRAVENOUS | Status: AC
  Administered 2022-07-11: 78 mg via INTRAVENOUS
  Filled 2022-07-11: qty 13

## 2022-07-11 NOTE — Patient Instructions (Signed)
[facility] ONCOLOGY  Discharge Instructions: Thank you for choosing [facility] to provide your oncology and hematology care.   If you have a lab appointment with the [facility], please go directly to the [facility] and check in at the registration area.   Wear comfortable clothing and clothing appropriate for easy access to any Portacath or PICC line.   We strive to give you quality time with your provider. You may need to reschedule your appointment if you arrive late (15 or more minutes).  Arriving late affects you and other patients whose appointments are after yours.  Also, if you miss three or more appointments without notifying the office, you may be dismissed from the clinic at the provider's discretion.      For prescription refill requests, have your pharmacy contact our office and allow 72 hours for refills to be completed.    Today you received the following chemotherapy and/or immunotherapy agent: Paclitaxel (Taxol)   To help prevent nausea and vomiting after your treatment, we encourage you to take your nausea medication as directed.  BELOW ARE SYMPTOMS THAT SHOULD BE REPORTED IMMEDIATELY: *FEVER GREATER THAN 100.4 F (38 C) OR HIGHER *CHILLS OR SWEATING *NAUSEA AND VOMITING THAT IS NOT CONTROLLED WITH YOUR NAUSEA MEDICATION *UNUSUAL SHORTNESS OF BREATH *UNUSUAL BRUISING OR BLEEDING *URINARY PROBLEMS (pain or burning when urinating, or frequent urination) *BOWEL PROBLEMS (unusual diarrhea, constipation, pain near the anus) TENDERNESS IN MOUTH AND THROAT WITH OR WITHOUT PRESENCE OF ULCERS (sore throat, sores in mouth, or a toothache) UNUSUAL RASH, SWELLING OR PAIN  UNUSUAL VAGINAL DISCHARGE OR ITCHING   Items with * indicate a potential emergency and should be followed up as soon as possible or go to the Emergency Department if any problems should occur.  Please show the CHEMOTHERAPY ALERT CARD or IMMUNOTHERAPY ALERT CARD at  check-in to the Emergency Department and triage nurse.  Should you have questions after your visit or need to cancel or reschedule your appointment, please contact [facility]  Dept: [phone]  and follow the prompts.  Office hours are 8:00 a.m. to 4:30 p.m. Monday - Friday. Please note that voicemails left after 4:00 p.m. may not be returned until the following business day.  We are closed weekends and major holidays. You have access to a nurse at all times for urgent questions. Please call the main number to the clinic Dept: [phone] and follow the prompts.   For any non-urgent questions, you may also contact your provider using MyChart. We now offer e-Visits for anyone 23 and older to request care online for non-urgent symptoms. For details visit [email or web address].   Also download the MyChart app! Go to the app store, search "MyChart", open the app, select [facility], and log in with your MyChart username and password.  Masks are optional in the cancer centers. If you would like for your care team to wear a mask while they are taking care of you, please let them know. You may have one support person who is at least 66 years old accompany you for your appointments. Paclitaxel Injection What is this medication? PACLITAXEL (PAK li TAX el) treats some types of cancer. It works by slowing down the growth of cancer cells. This medicine may be used for other purposes; ask your health care provider or pharmacist if you have questions. COMMON BRAND NAME(S): Onxol, Taxol What should I tell my care team before I take this medication? They need  to know if you have any of these conditions: Heart disease Liver disease Low white blood cell levels An unusual or allergic reaction to paclitaxel, other medications, foods, dyes, or preservatives If you or your partner are pregnant or trying to get pregnant Breast-feeding How should I use this medication? This  medication is injected into a vein. It is given by your care team in a hospital or clinic setting. Talk to your care team about the use of this medication in children. While it may be given to children for selected conditions, precautions do apply. Overdosage: If you think you have taken too much of this medicine contact a poison control center or emergency room at once. NOTE: This medicine is only for you. Do not share this medicine with others. What if I miss a dose? Keep appointments for follow-up doses. It is important not to miss your dose. Call your care team if you are unable to keep an appointment. What may interact with this medication? Do not take this medication with any of the following: Live virus vaccines Other medications may affect the way this medication works. Talk with your care team about all of the medications you take. They may suggest changes to your treatment plan to lower the risk of side effects and to make sure your medications work as intended. This list may not describe all possible interactions. Give your health care provider a list of all the medicines, herbs, non-prescription drugs, or dietary supplements you use. Also tell them if you smoke, drink alcohol, or use illegal drugs. Some items may interact with your medicine. What should I watch for while using this medication? Your condition will be monitored carefully while you are receiving this medication. You may need blood work while taking this medication. This medication may make you feel generally unwell. This is not uncommon as chemotherapy can affect healthy cells as well as cancer cells. Report any side effects. Continue your course of treatment even though you feel ill unless your care team tells you to stop. This medication can cause serious allergic reactions. To reduce the risk, your care team may give you other medications to take before receiving this one. Be sure to follow the directions from your care  team. This medication may increase your risk of getting an infection. Call your care team for advice if you get a fever, chills, sore throat, or other symptoms of a cold or flu. Do not treat yourself. Try to avoid being around people who are sick. This medication may increase your risk to bruise or bleed. Call your care team if you notice any unusual bleeding. Be careful brushing or flossing your teeth or using a toothpick because you may get an infection or bleed more easily. If you have any dental work done, tell your dentist you are receiving this medication. Talk to your care team if you may be pregnant. Serious birth defects can occur if you take this medication during pregnancy. Talk to your care team before breastfeeding. Changes to your treatment plan may be needed. What side effects may I notice from receiving this medication? Side effects that you should report to your care team as soon as possible: Allergic reactions--skin rash, itching, hives, swelling of the face, lips, tongue, or throat Heart rhythm changes--fast or irregular heartbeat, dizziness, feeling faint or lightheaded, chest pain, trouble breathing Increase in blood pressure Infection--fever, chills, cough, sore throat, wounds that don't heal, pain or trouble when passing urine, general feeling of discomfort or being  unwell Low blood pressure--dizziness, feeling faint or lightheaded, blurry vision Low red blood cell level--unusual weakness or fatigue, dizziness, headache, trouble breathing Painful swelling, warmth, or redness of the skin, blisters or sores at the infusion site Pain, tingling, or numbness in the hands or feet Slow heartbeat--dizziness, feeling faint or lightheaded, confusion, trouble breathing, unusual weakness or fatigue Unusual bruising or bleeding Side effects that usually do not require medical attention (report to your care team if they continue or are bothersome): Diarrhea Hair loss Joint pain Loss of  appetite Muscle pain Nausea Vomiting This list may not describe all possible side effects. Call your doctor for medical advice about side effects. You may report side effects to FDA at 1-800-FDA-1088. Where should I keep my medication? This medication is given in a hospital or clinic. It will not be stored at home. NOTE: This sheet is a summary. It may not cover all possible information. If you have questions about this medicine, talk to your doctor, pharmacist, or health care provider.  2023 Elsevier/Gold Standard (2021-11-13 00:00:00)

## 2022-07-11 NOTE — Assessment & Plan Note (Signed)
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  Neoadjuvant chemotherapy with dose dense Adriamycin and Cytoxan x4 followed by Taxol weekly x12 2. breast conserving surgery with targeted node dissection 3.  Adjuvant radiation 4.  Follow-up antiestrogen therapy with abemaciclib --------------------------------------------------------------------------------------------------------------------------------- Current treatment: Completed 4 cycles of dose dense Adriamycin and Cytoxan, today cycle 8 Taxol   Chemotoxicities: 1.  Neutropenia: Reduced the dosage from cycle 2 to 50 mg per metered square.  Today's ANC is 1.4.  We are continuing the same dosage. 2. Fatigue 3.  Nausea: Fairly mild: Takes intermittent Zofran 4.  Chemo-induced anemia: Hemoglobin today is 9.7: Monitoring   Monitoring closely for toxicities. Return to clinic weekly for Taxol

## 2022-07-11 NOTE — Progress Notes (Signed)
Nutrition Follow-up:  Patient with breast cancer of left breast, ER positive HER2 negative. She is receiving adjuvant dose dense AC q14d + paclitaxel q7d.   RD working remotely.  Spoke with patient via telephone during infusion. She reports tolerating reduced dose therapy much better. Her appetite has been good. Says she feels she is always eating something. Recalls cereal, chicken pasta, garlic roll, yogurt, cheese, fruit, pecans, and honeybun yesterday. Patient reports intermittent constipation. She had a bowel movement yesterday.  Patient has occasional nausea. Antiemetics are working well.   Medications: reviewed   Labs: glucose 123, Hgb 9.5  Anthropometrics: Weight 121 lb 11.2 oz today stable  NUTRITION DIAGNOSIS: Food and nutrition related knowledge deficit improving   INTERVENTION:  Continue strategies for increasing calories and protein with small frequent meals and snacks    MONITORING, EVALUATION, GOAL: weight trends, intake   NEXT VISIT: To be scheduled as needed

## 2022-07-17 MED FILL — Dexamethasone Sodium Phosphate Inj 100 MG/10ML: INTRAMUSCULAR | Qty: 1 | Status: AC

## 2022-07-17 NOTE — Progress Notes (Signed)
Patient Care Team: [name], MD as PCP - General (Family Medicine) Carlyle Basques, MD as PCP - Infectious Diseases (Infectious Diseases) Rockwell Germany, RN as Oncology Nurse Navigator Mauro Kaufmann, RN as Oncology Nurse Navigator  DIAGNOSIS:  Encounter Diagnosis  Name Primary?   Malignant neoplasm of upper-outer quadrant of left breast in female, estrogen receptor positive ([place]) Yes    SUMMARY OF ONCOLOGIC HISTORY: Oncology History  Malignant neoplasm of upper-outer quadrant of left breast in female, estrogen receptor positive ([place])  02/28/2022 Initial Diagnosis   Screening detected left breast mass at 3 o'clock position measuring 0.9 cm, 4 enlarged left axillary lymph nodes along with asymmetric ducts between mass and the nipple possibly DCIS; lymph node biopsy: Positive, breast biopsy: Grade 2 IDC ER 100%, PR 0%, HER2 negative, Ki-67 10%   03/10/2022 Cancer Staging   Staging form: Breast, AJCC 8th Edition - Clinical stage from 03/10/2022: Stage IIA (cT1b, cN1(f), cM0, G2, ER+, PR-, HER2-) - Signed by Hayden Pedro, PA-C on 03/10/2022 Stage prefix: Initial diagnosis Method of lymph node assessment: Core biopsy Histologic grading system: 3 grade system   03/27/2022 -  Chemotherapy   Patient is on Treatment Plan : BREAST ADJUVANT DOSE DENSE AC q14d / PACLitaxel q7d       CHIEF COMPLIANT: Follow-up Taxol cycle 9  INTERVAL HISTORY: Veronica Robles is a 66 year old with above-mentioned history of left breast cancer is currently on adjuvant chemotherapy Taxol. She presents to the clinic for a follow-up and treatment. She reports she feels pretty good she has more energy.  Overall she does not have any complaints of neuropathy.  Denies any nausea or vomiting.   ALLERGIES:  is allergic to penicillins.  MEDICATIONS:  Current Outpatient Medications  Medication Sig Dispense Refill   Blood Pressure Monitoring (ADULT BLOOD PRESSURE CUFF LG) KIT        emtricitabine-tenofovir AF (DESCOVY) 200-25 MG tablet Take 1 tablet by mouth daily. 30 tablet 5   fostemsavir tromethamine (RUKOBIA) 600 MG TB12 ER tablet Take 1 tablet (600 mg total) by mouth 2 (two) times daily. 60 tablet 5   hydrochlorothiazide (HYDRODIURIL) 25 MG tablet Take 1 tablet (25 mg total) by mouth daily. 90 tablet 1   lidocaine-prilocaine (EMLA) cream Apply to affected area once 30 g 3   ondansetron (ZOFRAN) 8 MG tablet Take 1 tablet (8 mg) by mouth every 8 hours as needed for nausea/vomiting. Start third day after doxorubicin/cyclophosphamide chemotherapy. 30 tablet 1   prochlorperazine (COMPAZINE) 10 MG tablet Take 1 tablet (10 mg total) by mouth every 6 (six) hours as needed for nausea or vomiting. 30 tablet 1   SQ injection lenacapavir (SUNLENCA) 463.5 MG/1.5ML SQ injection Inject 3 mLs (927 mg total) into the skin every 6 (six) months. Administer each injection subcutaneously at separate sites in the abdomen (more or equal to 2 inches from the navel). 3 mL 2   sulfamethoxazole-trimethoprim (BACTRIM) 200-40 MG/5ML suspension Take 20 mLs by mouth daily. 473 mL 5   No current facility-administered medications for this visit.   Facility-Administered Medications Ordered in Other Visits  Medication Dose Route Frequency Provider Last Rate Last Admin   ciprofloxacin (CIPRO) IVPB 400 mg  400 mg Intravenous Q12H Kathie Rhodes, MD   400 mg at 03/26/22 3762    PHYSICAL EXAMINATION: ECOG PERFORMANCE STATUS: 1 - Symptomatic but completely ambulatory  Vitals:   07/18/22 1012  BP: 138/71  Pulse: 82  Resp: 16  Temp: 98 F (36.7 C)  SpO2:  100%   Filed Weights   07/18/22 1012  Weight: 123 lb 4.8 oz (55.9 kg)      LABORATORY DATA:  I have reviewed the data as listed    Latest Ref Rng & Units 07/11/2022    8:34 AM 07/04/2022    7:54 AM 06/27/2022    8:30 AM  CMP  Glucose 70 - 99 mg/dL 123  119  118   BUN 8 - 23 mg/dL _0 Creatinine 0.44 - 1.00 mg/dL 0.88  0.90  0.91    Sodium 135 - 145 mmol/L 141  140  140   Potassium 3.5 - 5.1 mmol/L 3.7  4.2  4.0   Chloride 98 - 111 mmol/L 108  108  106   CO2 22 - 32 mmol/L _1 Calcium 8.9 - 10.3 mg/dL 9.6  9.7  9.7   Total Protein 6.5 - 8.1 g/dL 5.9  6.2  6.5   Total Bilirubin 0.3 - 1.2 mg/dL 0.3  0.4  0.4   Alkaline Phos 38 - 126 U/L 59  62  61   AST 15 - 41 U/L _2 ALT 0 - 44 U/L _3 Lab Results  Component Value Date   WBC 2.2 (L) 07/18/2022   HGB 9.3 (L) 07/18/2022   HCT 28.1 (L) 07/18/2022   MCV 94.9 07/18/2022   PLT 137 (L) 07/18/2022   NEUTROABS 1.2 (L) 07/18/2022    ASSESSMENT & PLAN:  Malignant neoplasm of upper-outer quadrant of left breast in female, estrogen receptor positive ([place]) 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; lymph node biopsy: Positive, breast biopsy: Grade 2 IDC ER 100%, PR 0%, HER2 negative, Ki-67 10%   Treatment plan: 1.  Neoadjuvant chemotherapy with dose dense Adriamycin and Cytoxan x4 followed by Taxol weekly x12 2. breast conserving surgery with targeted node dissection 3.  Adjuvant radiation 4.  Follow-up antiestrogen therapy with abemaciclib --------------------------------------------------------------------------------------------------------------------------------- Current treatment: Completed 4 cycles of dose dense Adriamycin and Cytoxan, today cycle 9 Taxol   Chemotoxicities: 1.  Neutropenia: Reduced the dosage from cycle 2 to 50 mg per metered square.  Today's ANC is 1.2.  Okay to treat today.  I reduce the dosage of Taxol to 45 mg/m.   2. Fatigue 3.  Nausea: Fairly mild: Takes intermittent Zofran 4.  Chemo-induced anemia: Hemoglobin today is 9.5: Monitoring   Monitoring closely for toxicities. We may decide to stop her chemo after 10 cycles of Taxol.  We will reassess her in January  for pendetide if she can proceed with the last 2 rounds of chemo or not.  If she does not proceed then we will set up for breast MRI and talk about surgical planning at that time. Return to clinic weekly for Taxol        No orders of the defined types were placed in this encounter.  The patient has a good understanding of the overall plan. she agrees with it. she will call with any problems that may develop before the next visit here. Total time spent: 30 mins including face to face time and time spent for planning, charting and co-ordination of care   Harriette Ohara, MD 07/18/22    I Gardiner Coins am acting as a Education administrator for  Dr.Rathana Viveros  I have reviewed the above documentation for accuracy and completeness, and I agree with the above.

## 2022-07-18 ENCOUNTER — Inpatient Hospital Stay: Payer: Medicare HMO

## 2022-07-18 ENCOUNTER — Inpatient Hospital Stay (HOSPITAL_BASED_OUTPATIENT_CLINIC_OR_DEPARTMENT_OTHER): Payer: Medicare HMO | Admitting: Hematology and Oncology

## 2022-07-18 ENCOUNTER — Other Ambulatory Visit: Payer: Self-pay

## 2022-07-18 VITALS — BP 138/71 | HR 82 | Temp 98.0°F | Resp 16 | Ht 62.0 in | Wt 123.3 lb

## 2022-07-18 DIAGNOSIS — Z170 Estrogen receptor positive status [ER+]: Secondary | ICD-10-CM

## 2022-07-18 DIAGNOSIS — Z95828 Presence of other vascular implants and grafts: Secondary | ICD-10-CM

## 2022-07-18 DIAGNOSIS — C50412 Malignant neoplasm of upper-outer quadrant of left female breast: Secondary | ICD-10-CM | POA: Diagnosis not present

## 2022-07-18 DIAGNOSIS — Z5111 Encounter for antineoplastic chemotherapy: Secondary | ICD-10-CM | POA: Diagnosis not present

## 2022-07-18 LAB — CBC WITH DIFFERENTIAL (CANCER CENTER ONLY)
Abs Immature Granulocytes: 0.01 10*3/uL (ref 0.00–0.07)
Basophils Absolute: 0 10*3/uL (ref 0.0–0.1)
Basophils Relative: 0 %
Eosinophils Absolute: 0 10*3/uL (ref 0.0–0.5)
Eosinophils Relative: 1 %
HCT: 28.1 % — ABNORMAL LOW (ref 36.0–46.0)
Hemoglobin: 9.3 g/dL — ABNORMAL LOW (ref 12.0–15.0)
Immature Granulocytes: 0 %
Lymphocytes Relative: 30 %
Lymphs Abs: 0.7 10*3/uL (ref 0.7–4.0)
MCH: 31.4 pg (ref 26.0–34.0)
MCHC: 33.1 g/dL (ref 30.0–36.0)
MCV: 94.9 fL (ref 80.0–100.0)
Monocytes Absolute: 0.3 10*3/uL (ref 0.1–1.0)
Monocytes Relative: 13 %
Neutro Abs: 1.2 10*3/uL — ABNORMAL LOW (ref 1.7–7.7)
Neutrophils Relative %: 56 %
Platelet Count: 137 10*3/uL — ABNORMAL LOW (ref 150–400)
RBC: 2.96 MIL/uL — ABNORMAL LOW (ref 3.87–5.11)
RDW: 13.8 % (ref 11.5–15.5)
WBC Count: 2.2 10*3/uL — ABNORMAL LOW (ref 4.0–10.5)
nRBC: 0 % (ref 0.0–0.2)

## 2022-07-18 LAB — CMP (CANCER CENTER ONLY)
ALT: 27 U/L (ref 0–44)
AST: 25 U/L (ref 15–41)
Albumin: 3.9 g/dL (ref 3.5–5.0)
Alkaline Phosphatase: 54 U/L (ref 38–126)
Anion gap: 2 — ABNORMAL LOW (ref 5–15)
BUN: 11 mg/dL (ref 8–23)
CO2: 30 mmol/L (ref 22–32)
Calcium: 9.4 mg/dL (ref 8.9–10.3)
Chloride: 109 mmol/L (ref 98–111)
Creatinine: 0.77 mg/dL (ref 0.44–1.00)
GFR, Estimated: >60 mL/min (ref >60)
Glucose, Bld: 117 mg/dL — ABNORMAL HIGH (ref 70–99)
Potassium: 3.9 mmol/L (ref 3.5–5.1)
Sodium: 141 mmol/L (ref 135–145)
Total Bilirubin: 0.3 mg/dL (ref 0.3–1.2)
Total Protein: 6.1 g/dL — ABNORMAL LOW (ref 6.5–8.1)

## 2022-07-18 MED ORDER — SODIUM CHLORIDE 0.9% FLUSH
10.0000 mL | INTRAVENOUS | Status: DC | PRN
  Administered 2022-07-18: 10 mL

## 2022-07-18 MED ORDER — HEPARIN SOD (PORK) LOCK FLUSH 100 UNIT/ML IV SOLN
500.0000 [IU] | Freq: Once | INTRAVENOUS | Status: AC | PRN
  Administered 2022-07-18: 500 [IU]

## 2022-07-18 MED ORDER — SODIUM CHLORIDE 0.9% FLUSH
10.0000 mL | INTRAVENOUS | Status: AC | PRN
  Administered 2022-07-18: 10 mL

## 2022-07-18 MED ORDER — SODIUM CHLORIDE 0.9 % IV SOLN: Freq: Once | INTRAVENOUS | Status: AC

## 2022-07-18 MED ORDER — FAMOTIDINE IN NACL 20-0.9 MG/50ML-% IV SOLN
20.0000 mg | Freq: Once | INTRAVENOUS | Status: AC
  Administered 2022-07-18: 20 mg via INTRAVENOUS
  Filled 2022-07-18: qty 50

## 2022-07-18 MED ORDER — SODIUM CHLORIDE 0.9 % IV SOLN
10.0000 mg | Freq: Once | INTRAVENOUS | Status: AC
  Administered 2022-07-18: 10 mg via INTRAVENOUS
  Filled 2022-07-18: qty 10

## 2022-07-18 MED ORDER — SODIUM CHLORIDE 0.9 % IV SOLN
45.0000 mg/m2 | Freq: Once | INTRAVENOUS | Status: AC
  Administered 2022-07-18: 72 mg via INTRAVENOUS
  Filled 2022-07-18: qty 12

## 2022-07-18 MED ORDER — CETIRIZINE HCL 10 MG/ML IV SOLN
10.0000 mg | Freq: Once | INTRAVENOUS | Status: AC
  Administered 2022-07-18: 10 mg via INTRAVENOUS
  Filled 2022-07-18: qty 1

## 2022-07-18 NOTE — Patient Instructions (Signed)
[facility]  Discharge Instructions: Thank you for choosing [facility] to provide your oncology and hematology care.   If you have a lab appointment with the [facility], please go directly to the [facility] and check in at the registration area.   Wear comfortable clothing and clothing appropriate for easy access to any Portacath or PICC line.   We strive to give you quality time with your provider. You may need to reschedule your appointment if you arrive late (15 or more minutes).  Arriving late affects you and other patients whose appointments are after yours.  Also, if you miss three or more appointments without notifying the office, you may be dismissed from the clinic at the provider's discretion.      For prescription refill requests, have your pharmacy contact our office and allow 72 hours for refills to be completed.    Today you received the following chemotherapy and/or immunotherapy agent: Paclitaxel (Taxol)   To help prevent nausea and vomiting after your treatment, we encourage you to take your nausea medication as directed.  BELOW ARE SYMPTOMS THAT SHOULD BE REPORTED IMMEDIATELY: *FEVER GREATER THAN 100.4 F (38 C) OR HIGHER *CHILLS OR SWEATING *NAUSEA AND VOMITING THAT IS NOT CONTROLLED WITH YOUR NAUSEA MEDICATION *UNUSUAL SHORTNESS OF BREATH *UNUSUAL BRUISING OR BLEEDING *URINARY PROBLEMS (pain or burning when urinating, or frequent urination) *BOWEL PROBLEMS (unusual diarrhea, constipation, pain near the anus) TENDERNESS IN MOUTH AND THROAT WITH OR WITHOUT PRESENCE OF ULCERS (sore throat, sores in mouth, or a toothache) UNUSUAL RASH, SWELLING OR PAIN  UNUSUAL VAGINAL DISCHARGE OR ITCHING   Items with * indicate a potential emergency and should be followed up as soon as possible or go to the Emergency Department if any problems should occur.  Please show the CHEMOTHERAPY ALERT CARD or IMMUNOTHERAPY ALERT CARD at  check-in to the Emergency Department and triage nurse.  Should you have questions after your visit or need to cancel or reschedule your appointment, please contact [facility]  Dept: [phone]  and follow the prompts.  Office hours are 8:00 a.m. to 4:30 p.m. Monday - Friday. Please note that voicemails left after 4:00 p.m. may not be returned until the following business day.  We are closed weekends and major holidays. You have access to a nurse at all times for urgent questions. Please call the main number to the clinic Dept: [phone] and follow the prompts.   For any non-urgent questions, you may also contact your provider using MyChart. We now offer e-Visits for anyone 64 and older to request care online for non-urgent symptoms. For details visit [email or web address].   Also download the MyChart app! Go to the app store, search "MyChart", open the app, select [facility], and log in with your MyChart username and password.  Masks are optional in the cancer centers. If you would like for your care team to wear a mask while they are taking care of you, please let them know. You may have one support Carrick Rijos who is at least 66 years old accompany you for your appointments. Paclitaxel Injection What is this medication? PACLITAXEL (PAK li TAX el) treats some types of cancer. It works by slowing down the growth of cancer cells. This medicine may be used for other purposes; ask your health care provider or pharmacist if you have questions. COMMON BRAND NAME(S): Onxol, Taxol What should I tell my care team before I take this medication? They need  to know if you have any of these conditions: Heart disease Liver disease Low white blood cell levels An unusual or allergic reaction to paclitaxel, other medications, foods, dyes, or preservatives If you or your partner are pregnant or trying to get pregnant Breast-feeding How should I use this medication? This  medication is injected into a vein. It is given by your care team in a hospital or clinic setting. Talk to your care team about the use of this medication in children. While it may be given to children for selected conditions, precautions do apply. Overdosage: If you think you have taken too much of this medicine contact a poison control center or emergency room at once. NOTE: This medicine is only for you. Do not share this medicine with others. What if I miss a dose? Keep appointments for follow-up doses. It is important not to miss your dose. Call your care team if you are unable to keep an appointment. What may interact with this medication? Do not take this medication with any of the following: Live virus vaccines Other medications may affect the way this medication works. Talk with your care team about all of the medications you take. They may suggest changes to your treatment plan to lower the risk of side effects and to make sure your medications work as intended. This list may not describe all possible interactions. Give your health care provider a list of all the medicines, herbs, non-prescription drugs, or dietary supplements you use. Also tell them if you smoke, drink alcohol, or use illegal drugs. Some items may interact with your medicine. What should I watch for while using this medication? Your condition will be monitored carefully while you are receiving this medication. You may need blood work while taking this medication. This medication may make you feel generally unwell. This is not uncommon as chemotherapy can affect healthy cells as well as cancer cells. Report any side effects. Continue your course of treatment even though you feel ill unless your care team tells you to stop. This medication can cause serious allergic reactions. To reduce the risk, your care team may give you other medications to take before receiving this one. Be sure to follow the directions from your care  team. This medication may increase your risk of getting an infection. Call your care team for advice if you get a fever, chills, sore throat, or other symptoms of a cold or flu. Do not treat yourself. Try to avoid being around people who are sick. This medication may increase your risk to bruise or bleed. Call your care team if you notice any unusual bleeding. Be careful brushing or flossing your teeth or using a toothpick because you may get an infection or bleed more easily. If you have any dental work done, tell your dentist you are receiving this medication. Talk to your care team if you may be pregnant. Serious birth defects can occur if you take this medication during pregnancy. Talk to your care team before breastfeeding. Changes to your treatment plan may be needed. What side effects may I notice from receiving this medication? Side effects that you should report to your care team as soon as possible: Allergic reactions--skin rash, itching, hives, swelling of the face, lips, tongue, or throat Heart rhythm changes--fast or irregular heartbeat, dizziness, feeling faint or lightheaded, chest pain, trouble breathing Increase in blood pressure Infection--fever, chills, cough, sore throat, wounds that don't heal, pain or trouble when passing urine, general feeling of discomfort or being  unwell Low blood pressure--dizziness, feeling faint or lightheaded, blurry vision Low red blood cell level--unusual weakness or fatigue, dizziness, headache, trouble breathing Painful swelling, warmth, or redness of the skin, blisters or sores at the infusion site Pain, tingling, or numbness in the hands or feet Slow heartbeat--dizziness, feeling faint or lightheaded, confusion, trouble breathing, unusual weakness or fatigue Unusual bruising or bleeding Side effects that usually do not require medical attention (report to your care team if they continue or are bothersome): Diarrhea Hair loss Joint pain Loss of  appetite Muscle pain Nausea Vomiting This list may not describe all possible side effects. Call your doctor for medical advice about side effects. You may report side effects to FDA at 1-800-FDA-1088. Where should I keep my medication? This medication is given in a hospital or clinic. It will not be stored at home. NOTE: This sheet is a summary. It may not cover all possible information. If you have questions about this medicine, talk to your doctor, pharmacist, or health care provider.  2023 Elsevier/Gold Standard (2021-11-13 00:00:00)

## 2022-07-18 NOTE — Assessment & Plan Note (Addendum)
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  Neoadjuvant chemotherapy with dose dense Adriamycin and Cytoxan x4 followed by Taxol weekly x12 2. breast conserving surgery with targeted node dissection 3.  Adjuvant radiation 4.  Follow-up antiestrogen therapy with abemaciclib --------------------------------------------------------------------------------------------------------------------------------- Current treatment: Completed 4 cycles of dose dense Adriamycin and Cytoxan, today cycle 9 Taxol   Chemotoxicities: 1.  Neutropenia: Reduced the dosage from cycle 2 to 50 mg per metered square.  Today's ANC is 1.4.  We are continuing the same dosage. 2. Fatigue 3.  Nausea: Fairly mild: Takes intermittent Zofran 4.  Chemo-induced anemia: Hemoglobin today is 9.5: Monitoring   Monitoring closely for toxicities. We may decide to stop her chemo after 10 cycles of Taxol.  We will reassess her in January for pendetide if she can proceed with the last 2 rounds of chemo or not.  If she does not proceed then we will set up for breast MRI and talk about surgical planning at that time. Return to clinic weekly for Taxol

## 2022-07-18 NOTE — Progress Notes (Signed)
Per Dr. Geralyn Flash office visit note, ok to treat today with ANC of 1.2. Dose of paclitaxel reduced today.  Laray Anger, PharmD PGY-2 Pharmacy Resident Hematology/Oncology [phone]  07/18/2022 10:53 AM

## 2022-07-24 ENCOUNTER — Encounter: Payer: Self-pay | Admitting: *Deleted

## 2022-07-24 ENCOUNTER — Other Ambulatory Visit: Payer: Self-pay | Admitting: *Deleted

## 2022-07-24 DIAGNOSIS — Z170 Estrogen receptor positive status [ER+]: Secondary | ICD-10-CM

## 2022-07-24 MED FILL — Dexamethasone Sodium Phosphate Inj 100 MG/10ML: INTRAMUSCULAR | Qty: 1 | Status: AC

## 2022-07-25 ENCOUNTER — Inpatient Hospital Stay: Payer: Medicare HMO

## 2022-07-25 ENCOUNTER — Inpatient Hospital Stay (HOSPITAL_BASED_OUTPATIENT_CLINIC_OR_DEPARTMENT_OTHER): Payer: Medicare HMO | Admitting: Adult Health

## 2022-07-25 ENCOUNTER — Other Ambulatory Visit: Payer: Self-pay

## 2022-07-25 VITALS — BP 133/65 | HR 79 | Temp 97.8°F | Resp 18

## 2022-07-25 VITALS — BP 125/81 | HR 81 | Temp 97.5°F | Resp 18 | Ht 62.0 in | Wt 121.8 lb

## 2022-07-25 DIAGNOSIS — C50412 Malignant neoplasm of upper-outer quadrant of left female breast: Secondary | ICD-10-CM | POA: Diagnosis not present

## 2022-07-25 DIAGNOSIS — Z170 Estrogen receptor positive status [ER+]: Secondary | ICD-10-CM

## 2022-07-25 DIAGNOSIS — Z5111 Encounter for antineoplastic chemotherapy: Secondary | ICD-10-CM | POA: Diagnosis not present

## 2022-07-25 LAB — CMP (CANCER CENTER ONLY)
ALT: 23 U/L (ref 0–44)
AST: 23 U/L (ref 15–41)
Albumin: 4 g/dL (ref 3.5–5.0)
Alkaline Phosphatase: 58 U/L (ref 38–126)
Anion gap: 4 — ABNORMAL LOW (ref 5–15)
BUN: 8 mg/dL (ref 8–23)
CO2: 30 mmol/L (ref 22–32)
Calcium: 9.6 mg/dL (ref 8.9–10.3)
Chloride: 106 mmol/L (ref 98–111)
Creatinine: 0.84 mg/dL (ref 0.44–1.00)
GFR, Estimated: >60 mL/min (ref >60)
Glucose, Bld: 122 mg/dL — ABNORMAL HIGH (ref 70–99)
Potassium: 3.7 mmol/L (ref 3.5–5.1)
Sodium: 140 mmol/L (ref 135–145)
Total Bilirubin: 0.3 mg/dL (ref 0.3–1.2)
Total Protein: 6.4 g/dL — ABNORMAL LOW (ref 6.5–8.1)

## 2022-07-25 LAB — CBC WITH DIFFERENTIAL (CANCER CENTER ONLY)
Abs Immature Granulocytes: 0.03 10*3/uL (ref 0.00–0.07)
Basophils Absolute: 0 10*3/uL (ref 0.0–0.1)
Basophils Relative: 1 %
Eosinophils Absolute: 0 10*3/uL (ref 0.0–0.5)
Eosinophils Relative: 1 %
HCT: 30.6 % — ABNORMAL LOW (ref 36.0–46.0)
Hemoglobin: 10.1 g/dL — ABNORMAL LOW (ref 12.0–15.0)
Immature Granulocytes: 1 %
Lymphocytes Relative: 27 %
Lymphs Abs: 0.8 10*3/uL (ref 0.7–4.0)
MCH: 31.3 pg (ref 26.0–34.0)
MCHC: 33 g/dL (ref 30.0–36.0)
MCV: 94.7 fL (ref 80.0–100.0)
Monocytes Absolute: 0.4 10*3/uL (ref 0.1–1.0)
Monocytes Relative: 13 %
Neutro Abs: 1.8 10*3/uL (ref 1.7–7.7)
Neutrophils Relative %: 57 %
Platelet Count: 152 10*3/uL (ref 150–400)
RBC: 3.23 MIL/uL — ABNORMAL LOW (ref 3.87–5.11)
RDW: 13.5 % (ref 11.5–15.5)
WBC Count: 3.1 10*3/uL — ABNORMAL LOW (ref 4.0–10.5)
nRBC: 0 % (ref 0.0–0.2)

## 2022-07-25 MED ORDER — SODIUM CHLORIDE 0.9 % IV SOLN
45.0000 mg/m2 | Freq: Once | INTRAVENOUS | Status: AC
  Administered 2022-07-25: 72 mg via INTRAVENOUS
  Filled 2022-07-25: qty 12

## 2022-07-25 MED ORDER — SODIUM CHLORIDE 0.9 % IV SOLN
10.0000 mg | Freq: Once | INTRAVENOUS | Status: AC
  Administered 2022-07-25: 10 mg via INTRAVENOUS
  Filled 2022-07-25: qty 10

## 2022-07-25 MED ORDER — SODIUM CHLORIDE 0.9% FLUSH
10.0000 mL | INTRAVENOUS | Status: DC | PRN
  Administered 2022-07-25: 10 mL via INTRAVENOUS

## 2022-07-25 MED ORDER — FAMOTIDINE IN NACL 20-0.9 MG/50ML-% IV SOLN
20.0000 mg | Freq: Once | INTRAVENOUS | Status: AC
  Administered 2022-07-25: 20 mg via INTRAVENOUS
  Filled 2022-07-25: qty 50

## 2022-07-25 MED ORDER — SODIUM CHLORIDE 0.9 % IV SOLN: Freq: Once | INTRAVENOUS | Status: AC

## 2022-07-25 MED ORDER — HEPARIN SOD (PORK) LOCK FLUSH 100 UNIT/ML IV SOLN
500.0000 [IU] | Freq: Once | INTRAVENOUS | Status: AC | PRN
  Administered 2022-07-25: 500 [IU]

## 2022-07-25 MED ORDER — SODIUM CHLORIDE 0.9% FLUSH
10.0000 mL | INTRAVENOUS | Status: DC | PRN
  Administered 2022-07-25: 10 mL

## 2022-07-25 MED ORDER — CETIRIZINE HCL 10 MG/ML IV SOLN
10.0000 mg | Freq: Once | INTRAVENOUS | Status: AC
  Administered 2022-07-25: 10 mg via INTRAVENOUS
  Filled 2022-07-25: qty 1

## 2022-07-25 NOTE — Progress Notes (Incomplete)
[facility] Follow up:    Veronica Mai, MD [phone] [id number]   DIAGNOSIS: Cancer Staging  Malignant neoplasm of upper-outer quadrant of left breast in female, estrogen receptor positive ([place]) Staging form: Breast, AJCC 8th Edition - Clinical stage from 03/10/2022: Stage IIA (cT1b, cN1(f), cM0, G2, ER+, PR-, HER2-) - Signed by Veronica Pedro, PA-C on 03/10/2022 Stage prefix: Initial diagnosis Method of lymph node assessment: Core biopsy Histologic grading system: 3 grade system   SUMMARY OF ONCOLOGIC HISTORY: Oncology History  Malignant neoplasm of upper-outer quadrant of left breast in female, estrogen receptor positive ([place])  02/28/2022 Initial Diagnosis   Screening detected left breast mass at 3 o'clock position measuring 0.9 cm, 4 enlarged left axillary lymph nodes along with asymmetric ducts between mass and the nipple possibly DCIS; lymph node biopsy: Positive, breast biopsy: Grade 2 IDC ER 100%, PR 0%, HER2 negative, Ki-67 10%   03/10/2022 Cancer Staging   Staging form: Breast, AJCC 8th Edition - Clinical stage from 03/10/2022: Stage IIA (cT1b, cN1(f), cM0, G2, ER+, PR-, HER2-) - Signed by Veronica Pedro, PA-C on 03/10/2022 Stage prefix: Initial diagnosis Method of lymph node assessment: Core biopsy Histologic grading system: 3 grade system   03/27/2022 -  Chemotherapy   Patient is on Treatment Plan : BREAST ADJUVANT DOSE DENSE AC q14d / PACLitaxel q7d       CURRENT THERAPY: Weekly Taxol  INTERVAL HISTORY: Veronica Robles 66 y.o. female returns for f/u and evaluation prior to receiving week 10 of neoadjuvant chemotherapy with Taxol.  [name] continues to tolerate the Taxol well.  She denies any peripheral neuropathy.     Patient Active Problem List   Diagnosis Date Noted  . Family history of breast cancer 03/20/2022  . Family history of pancreatic cancer 03/20/2022  . Gastroesophageal reflux disease without  esophagitis 03/12/2022  . Weight loss 03/12/2022  . Malignant neoplasm of upper-outer quadrant of left breast in female, estrogen receptor positive ([place]) 03/07/2022  . Dental caries 12/01/2021  . Other viral warts 12/01/2021  . Healthcare maintenance 11/09/2018  . 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 HYPERTENSION, BENIGN 01/26/2007  . 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 HIV disease ([place]) 10/26/2006  . HX, PERSONAL, PENICILLIN ALLERGY 10/26/2006  . HX, PERSONAL, DRUG ALLERGY NOS 10/26/2006    is allergic to penicillins.  MEDICAL HISTORY: Past Medical History:  Diagnosis Date  . Breast cancer ([place]) 02/2022   left breast IDC with DCIS  . Family history of breast cancer 03/20/2022  . Family history of pancreatic cancer 03/20/2022  . HIV infection ([place]) 1993  . Hypertension     SURGICAL HISTORY: Past Surgical History:  Procedure Laterality Date  . ABDOMINAL HYSTERECTOMY    . APPENDECTOMY    . BREAST BIOPSY Right 2015  . CYSTOSCOPY/RETROGRADE/URETEROSCOPY Right 11/14/2016   Procedure: CYSTOSCOPY/RETROGRADE/URETEROSCOPY/ STONE EXTRACTION/HOLMIUM LASER/STENT PLACEMENT;  Surgeon: Kathie Rhodes, MD;  Location: [facility];  Service: Urology;  Laterality: Right;  . PORTACATH PLACEMENT Right 03/26/2022   Procedure: INSERTION PORT-A-CATH;  Surgeon: [name], MD;  Location: [facility];  Service: General;  Laterality: Right;    SOCIAL HISTORY: Social History   Socioeconomic History  . Marital status: Single    Spouse name: Not on file  . Number of children: Not on file  . Years of education: Not on file  . Highest education level: Not on file  Occupational History  . Not on file  Tobacco  Use  . Smoking status: Former    Packs/day: 0.10    Types: Cigarettes  . Smokeless tobacco: Never  Vaping Use  . Vaping Use: Never used  Substance and Sexual Activity  . Alcohol use: No    Alcohol/week: 0.0 standard drinks of alcohol  . Drug  use: No  . Sexual activity: Not Currently    Partners: Male    Birth control/protection: Surgical    Comment: declined condoms  Other Topics Concern  . Not on file  Social History Narrative  . Not on file   Social Determinants of Health   Financial Resource Strain: Not on file  Food Insecurity: Not on file  Transportation Needs: Not on file  Physical Activity: Not on file  Stress: Not on file  Social Connections: Not on file  Intimate Partner Violence: Not on file    FAMILY HISTORY: Family History  Problem Relation Age of Onset  . Hypertension Mother   . Pancreatic cancer Father 55  . Breast cancer Sister        dx 72s  . Lung cancer Sister        d. 24    Review of Systems - Oncology    PHYSICAL EXAMINATION  ECOG PERFORMANCE STATUS: {CHL ONC ECOG XM:[id number]}  Vitals:   07/25/22 0948  BP: 125/81  Pulse: 81  Resp: 18  Temp: (!) 97.5 F (36.4 C)  SpO2: 100%    Physical Exam  LABORATORY DATA:  CBC    Component Value Date/Time   WBC 3.1 (L) 07/25/2022 0928   WBC 5.4 06/13/2021 1443   RBC 3.23 (L) 07/25/2022 0928   HGB 10.1 (L) 07/25/2022 0928   HCT 30.6 (L) 07/25/2022 0928   PLT 152 07/25/2022 0928   MCV 94.7 07/25/2022 0928   MCH 31.3 07/25/2022 0928   MCHC 33.0 07/25/2022 0928   RDW 13.5 07/25/2022 0928   LYMPHSABS 0.8 07/25/2022 0928   MONOABS 0.4 07/25/2022 0928   EOSABS 0.0 07/25/2022 0928   BASOSABS 0.0 07/25/2022 0928    CMP     Component Value Date/Time   NA 141 07/18/2022 0952   K 3.9 07/18/2022 0952   CL 109 07/18/2022 0952   CO2 30 07/18/2022 0952   GLUCOSE 117 (H) 07/18/2022 0952   BUN 11 07/18/2022 0952   CREATININE 0.77 07/18/2022 0952   CREATININE 1.02 08/29/2021 1408   CALCIUM 9.4 07/18/2022 0952   PROT 6.1 (L) 07/18/2022 0952   ALBUMIN 3.9 07/18/2022 0952   AST 25 07/18/2022 0952   ALT 27 07/18/2022 0952   ALKPHOS 54 07/18/2022 0952   BILITOT 0.3 07/18/2022 0952   GFRNONAA >60 07/18/2022 0952   GFRNONAA 63  11/01/2020 1414   GFRAA 73 11/01/2020 1414       PENDING LABS:   RADIOGRAPHIC STUDIES:  No results found.   PATHOLOGY:     ASSESSMENT and THERAPY PLAN:   No problem-specific Assessment & Plan notes found for this encounter.   No orders of the defined types were placed in this encounter.   All questions were answered. The patient knows to call the clinic with any problems, questions or concerns. We can certainly see the patient much sooner if necessary. This note was electronically signed. Scot Dock, NP 07/25/2022

## 2022-07-25 NOTE — Patient Instructions (Signed)
[facility]  Discharge Instructions: Thank you for choosing [facility] to provide your oncology and hematology care.   If you have a lab appointment with the [facility], please go directly to the [facility] and check in at the registration area.   Wear comfortable clothing and clothing appropriate for easy access to any Portacath or PICC line.   We strive to give you quality time with your provider. You may need to reschedule your appointment if you arrive late (15 or more minutes).  Arriving late affects you and other patients whose appointments are after yours.  Also, if you miss three or more appointments without notifying the office, you may be dismissed from the clinic at the provider's discretion.      For prescription refill requests, have your pharmacy contact our office and allow 72 hours for refills to be completed.    Today you received the following chemotherapy and/or immunotherapy agents: Paclitaxel (Taxol)       To help prevent nausea and vomiting after your treatment, we encourage you to take your nausea medication as directed.  BELOW ARE SYMPTOMS THAT SHOULD BE REPORTED IMMEDIATELY: *FEVER GREATER THAN 100.4 F (38 C) OR HIGHER *CHILLS OR SWEATING *NAUSEA AND VOMITING THAT IS NOT CONTROLLED WITH YOUR NAUSEA MEDICATION *UNUSUAL SHORTNESS OF BREATH *UNUSUAL BRUISING OR BLEEDING *URINARY PROBLEMS (pain or burning when urinating, or frequent urination) *BOWEL PROBLEMS (unusual diarrhea, constipation, pain near the anus) TENDERNESS IN MOUTH AND THROAT WITH OR WITHOUT PRESENCE OF ULCERS (sore throat, sores in mouth, or a toothache) UNUSUAL RASH, SWELLING OR PAIN  UNUSUAL VAGINAL DISCHARGE OR ITCHING   Items with * indicate a potential emergency and should be followed up as soon as possible or go to the Emergency Department if any problems should occur.  Please show the CHEMOTHERAPY ALERT CARD or IMMUNOTHERAPY ALERT CARD at  check-in to the Emergency Department and triage nurse.  Should you have questions after your visit or need to cancel or reschedule your appointment, please contact [facility]  Dept: [phone]  and follow the prompts.  Office hours are 8:00 a.m. to 4:30 p.m. Monday - Friday. Please note that voicemails left after 4:00 p.m. may not be returned until the following business day.  We are closed weekends and major holidays. You have access to a nurse at all times for urgent questions. Please call the main number to the clinic Dept: [phone] and follow the prompts.   For any non-urgent questions, you may also contact your provider using MyChart. We now offer e-Visits for anyone 64 and older to request care online for non-urgent symptoms. For details visit [email or web address].   Also download the MyChart app! Go to the app store, search "MyChart", open the app, select [facility], and log in with your MyChart username and password.

## 2022-07-28 ENCOUNTER — Encounter: Payer: Self-pay | Admitting: Hematology and Oncology

## 2022-07-28 NOTE — Assessment & Plan Note (Signed)
Veronica Robles is a 67 year-old woman with stage IIA ER positive breast cancer diagnosed in 02/2022 continuing on neoadjuvant chemotherapy.  She has completed the first four cycles of neoadjuvant Adriamycin and Cytoxan and now continues on weekly Taxol.    She is tolerating this treatment well.  Her labs are stable, she has no peripheral neuropathy.  She will proceed with her next cycle today.    We will see her next week for her next treatment.

## 2022-07-31 ENCOUNTER — Telehealth: Payer: Self-pay | Admitting: Family Medicine

## 2022-07-31 MED FILL — Dexamethasone Sodium Phosphate Inj 100 MG/10ML: INTRAMUSCULAR | Qty: 1 | Status: AC

## 2022-07-31 NOTE — Telephone Encounter (Signed)
Left message for patient to call back and schedule Medicare Annual Wellness Visit (AWV) either virtually or phone . Left  my Veronica Robles number [phone]   awvi 09/25/21 per [facility]  please schedule with Nurse Health Adviser   45 min for awv-i and in office appointments 30 min for awv-s  phone/virtual appointments

## 2022-08-01 ENCOUNTER — Other Ambulatory Visit: Payer: Self-pay

## 2022-08-01 ENCOUNTER — Inpatient Hospital Stay: Payer: Medicare HMO

## 2022-08-01 ENCOUNTER — Encounter: Payer: Self-pay | Admitting: Adult Health

## 2022-08-01 ENCOUNTER — Inpatient Hospital Stay: Payer: Medicare HMO | Attending: Hematology and Oncology

## 2022-08-01 ENCOUNTER — Inpatient Hospital Stay (HOSPITAL_BASED_OUTPATIENT_CLINIC_OR_DEPARTMENT_OTHER): Payer: Medicare HMO | Admitting: Adult Health

## 2022-08-01 ENCOUNTER — Other Ambulatory Visit: Payer: Medicare HMO

## 2022-08-01 ENCOUNTER — Ambulatory Visit: Payer: Medicare HMO | Admitting: Adult Health

## 2022-08-01 DIAGNOSIS — Z803 Family history of malignant neoplasm of breast: Secondary | ICD-10-CM | POA: Diagnosis not present

## 2022-08-01 DIAGNOSIS — B20 Human immunodeficiency virus [HIV] disease: Secondary | ICD-10-CM | POA: Insufficient documentation

## 2022-08-01 DIAGNOSIS — K219 Gastro-esophageal reflux disease without esophagitis: Secondary | ICD-10-CM | POA: Insufficient documentation

## 2022-08-01 DIAGNOSIS — Z801 Family history of malignant neoplasm of trachea, bronchus and lung: Secondary | ICD-10-CM | POA: Insufficient documentation

## 2022-08-01 DIAGNOSIS — C773 Secondary and unspecified malignant neoplasm of axilla and upper limb lymph nodes: Secondary | ICD-10-CM | POA: Diagnosis not present

## 2022-08-01 DIAGNOSIS — Z880 Allergy status to penicillin: Secondary | ICD-10-CM | POA: Diagnosis not present

## 2022-08-01 DIAGNOSIS — Z87891 Personal history of nicotine dependence: Secondary | ICD-10-CM | POA: Insufficient documentation

## 2022-08-01 DIAGNOSIS — I10 Essential (primary) hypertension: Secondary | ICD-10-CM | POA: Insufficient documentation

## 2022-08-01 DIAGNOSIS — C50412 Malignant neoplasm of upper-outer quadrant of left female breast: Secondary | ICD-10-CM

## 2022-08-01 DIAGNOSIS — Z79899 Other long term (current) drug therapy: Secondary | ICD-10-CM | POA: Insufficient documentation

## 2022-08-01 DIAGNOSIS — Z5111 Encounter for antineoplastic chemotherapy: Secondary | ICD-10-CM | POA: Insufficient documentation

## 2022-08-01 DIAGNOSIS — Z170 Estrogen receptor positive status [ER+]: Secondary | ICD-10-CM | POA: Insufficient documentation

## 2022-08-01 DIAGNOSIS — Z800 Family history of malignant neoplasm of digestive organs: Secondary | ICD-10-CM | POA: Insufficient documentation

## 2022-08-01 DIAGNOSIS — Z90710 Acquired absence of both cervix and uterus: Secondary | ICD-10-CM | POA: Diagnosis not present

## 2022-08-01 DIAGNOSIS — N6325 Unspecified lump in the left breast, overlapping quadrants: Secondary | ICD-10-CM | POA: Insufficient documentation

## 2022-08-01 DIAGNOSIS — Z9049 Acquired absence of other specified parts of digestive tract: Secondary | ICD-10-CM | POA: Insufficient documentation

## 2022-08-01 DIAGNOSIS — Z8249 Family history of ischemic heart disease and other diseases of the circulatory system: Secondary | ICD-10-CM | POA: Insufficient documentation

## 2022-08-01 DIAGNOSIS — N6489 Other specified disorders of breast: Secondary | ICD-10-CM | POA: Diagnosis not present

## 2022-08-01 LAB — CMP (CANCER CENTER ONLY)
ALT: 27 U/L (ref 0–44)
AST: 23 U/L (ref 15–41)
Albumin: 3.9 g/dL (ref 3.5–5.0)
Alkaline Phosphatase: 61 U/L (ref 38–126)
Anion gap: 5 (ref 5–15)
BUN: 9 mg/dL (ref 8–23)
CO2: 30 mmol/L (ref 22–32)
Calcium: 9.4 mg/dL (ref 8.9–10.3)
Chloride: 107 mmol/L (ref 98–111)
Creatinine: 0.78 mg/dL (ref 0.44–1.00)
GFR, Estimated: >60 mL/min (ref >60)
Glucose, Bld: 107 mg/dL — ABNORMAL HIGH (ref 70–99)
Potassium: 3.9 mmol/L (ref 3.5–5.1)
Sodium: 142 mmol/L (ref 135–145)
Total Bilirubin: 0.3 mg/dL (ref 0.3–1.2)
Total Protein: 5.6 g/dL — ABNORMAL LOW (ref 6.5–8.1)

## 2022-08-01 LAB — CBC WITH DIFFERENTIAL (CANCER CENTER ONLY)
Abs Immature Granulocytes: 0.02 10*3/uL (ref 0.00–0.07)
Basophils Absolute: 0 10*3/uL (ref 0.0–0.1)
Basophils Relative: 1 %
Eosinophils Absolute: 0 10*3/uL (ref 0.0–0.5)
Eosinophils Relative: 1 %
HCT: 30.1 % — ABNORMAL LOW (ref 36.0–46.0)
Hemoglobin: 9.9 g/dL — ABNORMAL LOW (ref 12.0–15.0)
Immature Granulocytes: 1 %
Lymphocytes Relative: 25 %
Lymphs Abs: 0.8 10*3/uL (ref 0.7–4.0)
MCH: 31.1 pg (ref 26.0–34.0)
MCHC: 32.9 g/dL (ref 30.0–36.0)
MCV: 94.7 fL (ref 80.0–100.0)
Monocytes Absolute: 0.4 10*3/uL (ref 0.1–1.0)
Monocytes Relative: 13 %
Neutro Abs: 1.9 10*3/uL (ref 1.7–7.7)
Neutrophils Relative %: 59 %
Platelet Count: 152 10*3/uL (ref 150–400)
RBC: 3.18 MIL/uL — ABNORMAL LOW (ref 3.87–5.11)
RDW: 13.1 % (ref 11.5–15.5)
WBC Count: 3.1 10*3/uL — ABNORMAL LOW (ref 4.0–10.5)
nRBC: 0 % (ref 0.0–0.2)

## 2022-08-01 MED ORDER — SODIUM CHLORIDE 0.9 % IV SOLN
10.0000 mg | Freq: Once | INTRAVENOUS | Status: AC
  Administered 2022-08-01: 10 mg via INTRAVENOUS
  Filled 2022-08-01: qty 10

## 2022-08-01 MED ORDER — SODIUM CHLORIDE 0.9 % IV SOLN: Freq: Once | INTRAVENOUS | Status: AC

## 2022-08-01 MED ORDER — HEPARIN SOD (PORK) LOCK FLUSH 100 UNIT/ML IV SOLN
500.0000 [IU] | Freq: Once | INTRAVENOUS | Status: AC | PRN
  Administered 2022-08-01: 500 [IU]

## 2022-08-01 MED ORDER — FAMOTIDINE IN NACL 20-0.9 MG/50ML-% IV SOLN
20.0000 mg | Freq: Once | INTRAVENOUS | Status: AC
  Administered 2022-08-01: 20 mg via INTRAVENOUS
  Filled 2022-08-01: qty 50

## 2022-08-01 MED ORDER — SODIUM CHLORIDE 0.9% FLUSH
10.0000 mL | INTRAVENOUS | Status: DC | PRN
  Administered 2022-08-01: 10 mL

## 2022-08-01 MED ORDER — SODIUM CHLORIDE 0.9 % IV SOLN
45.0000 mg/m2 | Freq: Once | INTRAVENOUS | Status: AC
  Administered 2022-08-01: 72 mg via INTRAVENOUS
  Filled 2022-08-01: qty 12

## 2022-08-01 MED ORDER — SODIUM CHLORIDE 0.9% FLUSH
10.0000 mL | INTRAVENOUS | Status: AC | PRN
  Administered 2022-08-01: 10 mL

## 2022-08-01 MED ORDER — CETIRIZINE HCL 10 MG/ML IV SOLN
10.0000 mg | Freq: Once | INTRAVENOUS | Status: AC
  Administered 2022-08-01: 10 mg via INTRAVENOUS
  Filled 2022-08-01: qty 1

## 2022-08-01 NOTE — Progress Notes (Signed)
[facility] Follow up:    Veronica Mai, MD [phone] [address] [place] [address]   DIAGNOSIS:  Cancer Staging  Malignant neoplasm of upper-outer quadrant of left breast in female, estrogen receptor positive ([place]) Staging form: Breast, AJCC 8th Edition - Clinical stage from 03/10/2022: Stage IIA (cT1b, cN1(f), cM0, G2, ER+, PR-, HER2-) - Signed by Hayden Pedro, PA-C on 03/10/2022 Stage prefix: Initial diagnosis Method of lymph node assessment: Core biopsy Histologic grading system: 3 grade system   SUMMARY OF ONCOLOGIC HISTORY: Oncology History  Malignant neoplasm of upper-outer quadrant of left breast in female, estrogen receptor positive ([place])  02/28/2022 Initial Diagnosis   Screening detected left breast mass at 3 o'clock position measuring 0.9 cm, 4 enlarged left axillary lymph nodes along with asymmetric ducts between mass and the nipple possibly DCIS; lymph node biopsy: Positive, breast biopsy: Grade 2 IDC ER 100%, PR 0%, HER2 negative, Ki-67 10%   03/10/2022 Cancer Staging   Staging form: Breast, AJCC 8th Edition - Clinical stage from 03/10/2022: Stage IIA (cT1b, cN1(f), cM0, G2, ER+, PR-, HER2-) - Signed by Hayden Pedro, PA-C on 03/10/2022 Stage prefix: Initial diagnosis Method of lymph node assessment: Core biopsy Histologic grading system: 3 grade system   03/27/2022 -  Chemotherapy   Patient is on Treatment Plan : BREAST ADJUVANT DOSE DENSE AC q14d / PACLitaxel q7d       CURRENT THERAPY: neoadjuvant chemotherapy on taxol   INTERVAL HISTORY: Veronica Robles 67 y.o. female returns for follow-up prior to receiving week 9 of neoadjuvant chemotherapy with Taxol.  She is here today for week #11 of treatment.  She has MRI of the breast bilaterally scheduled on August 09, 2022.  She has follow-up with Dr. Ninfa Linden on August 05, 2022.  She tells me that she continues to feel well.  She denies any problems especially peripheral  neuropathy.   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  MEDICAL HISTORY: Past Medical History:  Diagnosis Date   Breast cancer ([facility]) 02/2022   left breast IDC with DCIS   Family history of breast cancer 03/20/2022   Family history of pancreatic cancer 03/20/2022   HIV infection ([place]) 1993   Hypertension     SURGICAL HISTORY: Past Surgical History:  Procedure Laterality Date   ABDOMINAL HYSTERECTOMY     APPENDECTOMY     BREAST BIOPSY Right 2015   CYSTOSCOPY/RETROGRADE/URETEROSCOPY Right 11/14/2016   Procedure: CYSTOSCOPY/RETROGRADE/URETEROSCOPY/ STONE EXTRACTION/HOLMIUM LASER/STENT PLACEMENT;  Surgeon: Kathie Rhodes, MD;  Location: [facility];  Service: Urology;  Laterality: Right;   PORTACATH PLACEMENT Right 03/26/2022   Procedure: INSERTION PORT-A-CATH;  Surgeon: [name], MD;  Location: [facility];  Service: General;  Laterality: Right;    SOCIAL HISTORY: Social History   Socioeconomic History   Marital status: Single    Spouse  name: Not on file   Number of children: Not on file   Years of education: Not on file   Highest education level: Not on file  Occupational History   Not on file  Tobacco Use   Smoking status: Former    Packs/day: 0.10    Types: Cigarettes    Smokeless tobacco: Never  Vaping Use   Vaping Use: Never used  Substance and Sexual Activity   Alcohol use: No    Alcohol/week: 0.0 standard drinks of alcohol   Drug use: No   Sexual activity: Not Currently    Partners: Male    Birth control/protection: Surgical    Comment: declined condoms  Other Topics Concern   Not on file  Social History Narrative   Not on file   Social Determinants of Health   Financial Resource Strain: Not on file  Food Insecurity: Not on file  Transportation Needs: Not on file  Physical Activity: Not on file  Stress: Not on file  Social Connections: Not on file  Intimate Partner Violence: Not on file    FAMILY HISTORY: Family History  Problem Relation Age of Onset   Hypertension Mother    Pancreatic cancer Father 53   Breast cancer Sister        dx 32s   Lung cancer Sister        d. 21    Review of Systems  Constitutional:  Negative for appetite change, chills, fatigue, fever and unexpected weight change.  HENT:   Negative for hearing loss, lump/mass and trouble swallowing.   Eyes:  Negative for eye problems and icterus.  Respiratory:  Negative for chest tightness, cough and shortness of breath.   Cardiovascular:  Negative for chest pain, leg swelling and palpitations.  Gastrointestinal:  Negative for abdominal distention, abdominal pain, constipation, diarrhea, nausea and vomiting.  Endocrine: Negative for hot flashes.  Genitourinary:  Negative for difficulty urinating.   Musculoskeletal:  Negative for arthralgias.  Skin:  Negative for itching and rash.  Neurological:  Negative for dizziness, extremity weakness, headaches and numbness.  Hematological:  Negative for adenopathy. Does not bruise/bleed easily.  Psychiatric/Behavioral:  Negative for depression. The patient is not nervous/anxious.       PHYSICAL EXAMINATION  ECOG PERFORMANCE STATUS: 0 - Asymptomatic  Vitals:   08/01/22 1028  BP: 133/77  Pulse: 79  Resp: 16  Temp:  97.7 F (36.5 C)  SpO2: 100%    Physical Exam Constitutional:      General: She is not in acute distress.    Appearance: Normal appearance. She is not toxic-appearing.  HENT:     Head: Normocephalic and atraumatic.  Eyes:     General: No scleral icterus. Cardiovascular:     Rate and Rhythm: Normal rate and regular rhythm.     Pulses: Normal pulses.     Heart sounds: Normal heart sounds.  Pulmonary:     Effort: Pulmonary effort is normal.     Breath sounds: Normal breath sounds.  Abdominal:     General: Abdomen is flat. Bowel sounds are normal. There is no distension.     Palpations: Abdomen is soft.     Tenderness: There is no abdominal tenderness.  Musculoskeletal:        General: No swelling.     Cervical back: Neck supple.  Lymphadenopathy:     Cervical: No cervical adenopathy.  Skin:    General: Skin is warm and dry.     Findings: No rash.  Neurological:  General: No focal deficit present.     Mental Status: She is alert.  Psychiatric:        Mood and Affect: Mood normal.        Behavior: Behavior normal.     LABORATORY DATA:  CBC    Component Value Date/Time   WBC 3.1 (L) 08/01/2022 0948   WBC 5.4 06/13/2021 1443   RBC 3.18 (L) 08/01/2022 0948   HGB 9.9 (L) 08/01/2022 0948   HCT 30.1 (L) 08/01/2022 0948   PLT 152 08/01/2022 0948   MCV 94.7 08/01/2022 0948   MCH 31.1 08/01/2022 0948   MCHC 32.9 08/01/2022 0948   RDW 13.1 08/01/2022 0948   LYMPHSABS 0.8 08/01/2022 0948   MONOABS 0.4 08/01/2022 0948   EOSABS 0.0 08/01/2022 0948   BASOSABS 0.0 08/01/2022 0948    CMP     Component Value Date/Time   NA 142 08/01/2022 0948   K 3.9 08/01/2022 0948   CL 107 08/01/2022 0948   CO2 30 08/01/2022 0948   GLUCOSE 107 (H) 08/01/2022 0948   BUN 9 08/01/2022 0948   CREATININE 0.78 08/01/2022 0948   CREATININE 1.02 08/29/2021 1408   CALCIUM 9.4 08/01/2022 0948   PROT 5.6 (L) 08/01/2022 0948   ALBUMIN 3.9 08/01/2022 0948   AST 23 08/01/2022 0948   ALT  27 08/01/2022 0948   ALKPHOS 61 08/01/2022 0948   BILITOT 0.3 08/01/2022 0948   GFRNONAA >60 08/01/2022 0948   GFRNONAA 63 11/01/2020 1414   GFRAA 73 11/01/2020 1414      ASSESSMENT and THERAPY PLAN:   Malignant neoplasm of upper-outer quadrant of left breast in female, estrogen receptor positive ([facility]) Veronica Robles is a 67 year-old woman with stage IIA ER positive breast cancer diagnosed in 02/2022 continuing on neoadjuvant chemotherapy.  She has completed the first four cycles of neoadjuvant Adriamycin and Cytoxan and now continues on weekly Taxol.    Veronica Robles will proceed with week 11 of weekly neoadjuvant chemotherapy with Taxol.  She is tolerating this very well.  She has no signs of peripheral neuropathy.  I reviewed her labs with her in detail which are stable and she will proceed with her treatment today.  She will also undergo breast MRI on January 13 and before that she will see Dr. Ninfa Linden on January 9 for surgical planning discussion.  She is cleared to have her port removed at the time of surgery.  Veronica Robles will return in 1 week for labs, follow-up, and her final treatment.  We discussed today next steps as far as her treatments when she is considered cancer free and what to expect with her follow-up.   All questions were answered. The patient knows to call the clinic with any problems, questions or concerns. We can certainly see the patient much sooner if necessary.  Total encounter time:20 minutes*in face-to-face visit time, chart review, lab review, care coordination, order entry, and documentation of the encounter time.    Wilber Bihari, NP 08/01/22 11:20 AM Medical Oncology and Hematology [facility] [place], [place] [address] Tel. [phone]    Fax. [phone]  *Total Encounter Time as defined by the Centers for Medicare and Medicaid Services includes, in addition to the face-to-face time of a patient visit (documented in the note  above) non-face-to-face time: obtaining and reviewing outside history, ordering and reviewing medications, tests or procedures, care coordination (communications with other health care professionals or caregivers) and documentation in the medical record.

## 2022-08-01 NOTE — Assessment & Plan Note (Addendum)
Veronica Robles is a 67 year-old woman with stage IIA ER positive breast cancer diagnosed in 02/2022 continuing on neoadjuvant chemotherapy.  She has completed the first four cycles of neoadjuvant Adriamycin and Cytoxan and now continues on weekly Taxol.    Veronica Robles will proceed with week 11 of weekly neoadjuvant chemotherapy with Taxol.  She is tolerating this very well.  She has no signs of peripheral neuropathy.  I reviewed her labs with her in detail which are stable and she will proceed with her treatment today.  She will also undergo breast MRI on January 13 and before that she will see Dr. Ninfa Linden on January 9 for surgical planning discussion.  She is cleared to have her port removed at the time of surgery.  Veronica Robles will return in 1 week for labs, follow-up, and her final treatment.  We discussed today next steps as far as her treatments when she is considered cancer free and what to expect with her follow-up.

## 2022-08-05 ENCOUNTER — Other Ambulatory Visit: Payer: Self-pay | Admitting: Surgery

## 2022-08-05 DIAGNOSIS — C50912 Malignant neoplasm of unspecified site of left female breast: Secondary | ICD-10-CM

## 2022-08-07 ENCOUNTER — Encounter: Payer: Self-pay | Admitting: Hematology and Oncology

## 2022-08-07 ENCOUNTER — Encounter: Payer: Self-pay | Admitting: *Deleted

## 2022-08-07 DIAGNOSIS — C50412 Malignant neoplasm of upper-outer quadrant of left female breast: Secondary | ICD-10-CM

## 2022-08-07 MED FILL — Dexamethasone Sodium Phosphate Inj 100 MG/10ML: INTRAMUSCULAR | Qty: 1 | Status: AC

## 2022-08-08 ENCOUNTER — Inpatient Hospital Stay: Payer: Medicare HMO

## 2022-08-08 ENCOUNTER — Other Ambulatory Visit: Payer: Self-pay | Admitting: Surgery

## 2022-08-08 ENCOUNTER — Telehealth: Payer: Self-pay | Admitting: Radiation Oncology

## 2022-08-08 ENCOUNTER — Encounter: Payer: Self-pay | Admitting: Adult Health

## 2022-08-08 ENCOUNTER — Inpatient Hospital Stay (HOSPITAL_BASED_OUTPATIENT_CLINIC_OR_DEPARTMENT_OTHER): Payer: Medicare HMO | Admitting: Adult Health

## 2022-08-08 ENCOUNTER — Telehealth: Payer: Self-pay | Admitting: *Deleted

## 2022-08-08 VITALS — BP 143/75 | HR 82 | Temp 97.9°F | Resp 16 | Ht 62.0 in | Wt 123.3 lb

## 2022-08-08 DIAGNOSIS — C50912 Malignant neoplasm of unspecified site of left female breast: Secondary | ICD-10-CM

## 2022-08-08 DIAGNOSIS — C50412 Malignant neoplasm of upper-outer quadrant of left female breast: Secondary | ICD-10-CM | POA: Diagnosis not present

## 2022-08-08 DIAGNOSIS — Z5111 Encounter for antineoplastic chemotherapy: Secondary | ICD-10-CM | POA: Diagnosis not present

## 2022-08-08 DIAGNOSIS — Z170 Estrogen receptor positive status [ER+]: Secondary | ICD-10-CM

## 2022-08-08 DIAGNOSIS — Z95828 Presence of other vascular implants and grafts: Secondary | ICD-10-CM | POA: Insufficient documentation

## 2022-08-08 LAB — CBC WITH DIFFERENTIAL/PLATELET
Abs Immature Granulocytes: 0.02 10*3/uL (ref 0.00–0.07)
Basophils Absolute: 0 10*3/uL (ref 0.0–0.1)
Basophils Relative: 0 %
Eosinophils Absolute: 0 10*3/uL (ref 0.0–0.5)
Eosinophils Relative: 1 %
HCT: 30.2 % — ABNORMAL LOW (ref 36.0–46.0)
Hemoglobin: 10.1 g/dL — ABNORMAL LOW (ref 12.0–15.0)
Immature Granulocytes: 1 %
Lymphocytes Relative: 30 %
Lymphs Abs: 0.9 10*3/uL (ref 0.7–4.0)
MCH: 31.6 pg (ref 26.0–34.0)
MCHC: 33.4 g/dL (ref 30.0–36.0)
MCV: 94.4 fL (ref 80.0–100.0)
Monocytes Absolute: 0.4 10*3/uL (ref 0.1–1.0)
Monocytes Relative: 12 %
Neutro Abs: 1.6 10*3/uL — ABNORMAL LOW (ref 1.7–7.7)
Neutrophils Relative %: 56 %
Platelets: 156 10*3/uL (ref 150–400)
RBC: 3.2 MIL/uL — ABNORMAL LOW (ref 3.87–5.11)
RDW: 12.8 % (ref 11.5–15.5)
WBC: 2.9 10*3/uL — ABNORMAL LOW (ref 4.0–10.5)
nRBC: 0 % (ref 0.0–0.2)

## 2022-08-08 LAB — COMPREHENSIVE METABOLIC PANEL
ALT: 25 U/L (ref 0–44)
AST: 21 U/L (ref 15–41)
Albumin: 3.7 g/dL (ref 3.5–5.0)
Alkaline Phosphatase: 58 U/L (ref 38–126)
Anion gap: 3 — ABNORMAL LOW (ref 5–15)
BUN: 10 mg/dL (ref 8–23)
CO2: 29 mmol/L (ref 22–32)
Calcium: 9.1 mg/dL (ref 8.9–10.3)
Chloride: 108 mmol/L (ref 98–111)
Creatinine, Ser: 0.74 mg/dL (ref 0.44–1.00)
GFR, Estimated: >60 mL/min (ref >60)
Glucose, Bld: 99 mg/dL (ref 70–99)
Potassium: 3.9 mmol/L (ref 3.5–5.1)
Sodium: 140 mmol/L (ref 135–145)
Total Bilirubin: 0.3 mg/dL (ref 0.3–1.2)
Total Protein: 5.9 g/dL — ABNORMAL LOW (ref 6.5–8.1)

## 2022-08-08 MED ORDER — FAMOTIDINE IN NACL 20-0.9 MG/50ML-% IV SOLN
20.0000 mg | Freq: Once | INTRAVENOUS | Status: AC
  Administered 2022-08-08: 20 mg via INTRAVENOUS
  Filled 2022-08-08: qty 50

## 2022-08-08 MED ORDER — SODIUM CHLORIDE 0.9 % IV SOLN
10.0000 mg | Freq: Once | INTRAVENOUS | Status: AC
  Administered 2022-08-08: 10 mg via INTRAVENOUS
  Filled 2022-08-08: qty 10

## 2022-08-08 MED ORDER — SODIUM CHLORIDE 0.9 % IV SOLN: Freq: Once | INTRAVENOUS | Status: AC

## 2022-08-08 MED ORDER — HEPARIN SOD (PORK) LOCK FLUSH 100 UNIT/ML IV SOLN
500.0000 [IU] | Freq: Once | INTRAVENOUS | Status: AC | PRN
  Administered 2022-08-08: 500 [IU]

## 2022-08-08 MED ORDER — SODIUM CHLORIDE 0.9% FLUSH
10.0000 mL | INTRAVENOUS | Status: DC | PRN
  Administered 2022-08-08: 10 mL

## 2022-08-08 MED ORDER — CETIRIZINE HCL 10 MG/ML IV SOLN
10.0000 mg | Freq: Once | INTRAVENOUS | Status: AC
  Administered 2022-08-08: 10 mg via INTRAVENOUS
  Filled 2022-08-08: qty 1

## 2022-08-08 MED ORDER — SODIUM CHLORIDE 0.9% FLUSH
10.0000 mL | Freq: Once | INTRAVENOUS | Status: AC
  Administered 2022-08-08: 10 mL

## 2022-08-08 MED ORDER — SODIUM CHLORIDE 0.9 % IV SOLN
45.0000 mg/m2 | Freq: Once | INTRAVENOUS | Status: AC
  Administered 2022-08-08: 72 mg via INTRAVENOUS
  Filled 2022-08-08: qty 12

## 2022-08-08 NOTE — Progress Notes (Signed)
[facility] Follow up:    Veronica Mai, MD [phone] [address]   DIAGNOSIS: Cancer Staging  Malignant neoplasm of upper-outer quadrant of left breast in female, estrogen receptor positive ([place]) Staging form: Breast, AJCC 8th Edition - Clinical stage from 03/10/2022: Stage IIA (cT1b, cN1(f), cM0, G2, ER+, PR-, HER2-) - Signed by Hayden Pedro, PA-C on 03/10/2022 Stage prefix: Initial diagnosis Method of lymph node assessment: Core biopsy Histologic grading system: 3 grade system   SUMMARY OF ONCOLOGIC HISTORY: Oncology History  Malignant neoplasm of upper-outer quadrant of left breast in female, estrogen receptor positive ([place])  02/28/2022 Initial Diagnosis   Screening detected left breast mass at 3 o'clock position measuring 0.9 cm, 4 enlarged left axillary lymph nodes along with asymmetric ducts between mass and the nipple possibly DCIS; lymph node biopsy: Positive, breast biopsy: Grade 2 IDC ER 100%, PR 0%, HER2 negative, Ki-67 10%   03/10/2022 Cancer Staging   Staging form: Breast, AJCC 8th Edition - Clinical stage from 03/10/2022: Stage IIA (cT1b, cN1(f), cM0, G2, ER+, PR-, HER2-) - Signed by Hayden Pedro, PA-C on 03/10/2022 Stage prefix: Initial diagnosis Method of lymph node assessment: Core biopsy Histologic grading system: 3 grade system   03/27/2022 -  Chemotherapy   Patient is on Treatment Plan : BREAST ADJUVANT DOSE DENSE AC q14d / PACLitaxel q7d       CURRENT THERAPY:  INTERVAL HISTORY: Veronica Robles 67 y.o. female returns for    Patient Active Problem List   Diagnosis Date Noted  . Port-A-Cath in place 08/08/2022  . Family history of breast cancer 03/20/2022  . Family history of pancreatic cancer 03/20/2022  . Gastroesophageal reflux disease without esophagitis 03/12/2022  . Weight loss 03/12/2022  . Malignant neoplasm of upper-outer quadrant of left breast in female, estrogen receptor positive ([place])  03/07/2022  . Dental caries 12/01/2021  . Other viral warts 12/01/2021  . Healthcare maintenance 11/09/2018  . 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 HYPERTENSION, BENIGN 01/26/2007  . 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 HIV disease ([place]) 10/26/2006  . HX, PERSONAL, PENICILLIN ALLERGY 10/26/2006  . HX, PERSONAL, DRUG ALLERGY NOS 10/26/2006    is allergic to penicillins.  MEDICAL HISTORY: Past Medical History:  Diagnosis Date  . Breast cancer ([place]) 02/2022   left breast IDC with DCIS  . Family history of breast cancer 03/20/2022  . Family history of pancreatic cancer 03/20/2022  . HIV infection ([place]) 1993  . Hypertension     SURGICAL HISTORY: Past Surgical History:  Procedure Laterality Date  . ABDOMINAL HYSTERECTOMY    . APPENDECTOMY    . BREAST BIOPSY Right 2015  . CYSTOSCOPY/RETROGRADE/URETEROSCOPY Right 11/14/2016   Procedure: CYSTOSCOPY/RETROGRADE/URETEROSCOPY/ STONE EXTRACTION/HOLMIUM LASER/STENT PLACEMENT;  Surgeon: Kathie Rhodes, MD;  Location[facility];  Service: Urology;  Laterality: Right;  . PORTACATH PLACEMENT Right 03/26/2022   Procedure: INSERTION PORT-A-CATH;  Surgeon: [name], MD;  Location: [facility];  Service: General;  Laterality: Right;    SOCIAL HISTORY: Social History   Socioeconomic History  . Marital status: Single    Spouse name: Not on file  . Number of children: Not on file  . Years of education: Not on file  . Highest education level: Not on file  Occupational History  . Not on file  Tobacco Use  . Smoking status: Former    Packs/day: 0.10    Types: Cigarettes  . Smokeless tobacco: Never  Vaping Use  .  Vaping Use: Never used  Substance and Sexual Activity  . Alcohol use: No    Alcohol/week: 0.0 standard drinks of alcohol  . Drug use: No  . Sexual activity: Not Currently    Partners: Male    Birth control/protection: Surgical    Comment: declined condoms  Other Topics Concern  .  Not on file  Social History Narrative  . Not on file   Social Determinants of Health   Financial Resource Strain: Not on file  Food Insecurity: Not on file  Transportation Needs: Not on file  Physical Activity: Not on file  Stress: Not on file  Social Connections: Not on file  Intimate Partner Violence: Not on file    FAMILY HISTORY: Family History  Problem Relation Age of Onset  . Hypertension Mother   . Pancreatic cancer Father 56  . Breast cancer Sister        dx 17s  . Lung cancer Sister        d. 88    Review of Systems - Oncology    PHYSICAL EXAMINATION  ECOG PERFORMANCE STATUS: {CHL ONC ECOG ZO:[id number]}  Vitals:   08/08/22 0858  BP: (!) 143/75  Pulse: 82  Resp: 16  Temp: 97.9 F (36.6 C)  SpO2: 100%    Physical Exam  LABORATORY DATA:  CBC    Component Value Date/Time   WBC 2.9 (L) 08/08/2022 0845   RBC 3.20 (L) 08/08/2022 0845   HGB 10.1 (L) 08/08/2022 0845   HGB 9.9 (L) 08/01/2022 0948   HCT 30.2 (L) 08/08/2022 0845   PLT 156 08/08/2022 0845   PLT 152 08/01/2022 0948   MCV 94.4 08/08/2022 0845   MCH 31.6 08/08/2022 0845   MCHC 33.4 08/08/2022 0845   RDW 12.8 08/08/2022 0845   LYMPHSABS 0.9 08/08/2022 0845   MONOABS 0.4 08/08/2022 0845   EOSABS 0.0 08/08/2022 0845   BASOSABS 0.0 08/08/2022 0845    CMP     Component Value Date/Time   NA 140 08/08/2022 0845   K 3.9 08/08/2022 0845   CL 108 08/08/2022 0845   CO2 29 08/08/2022 0845   GLUCOSE 99 08/08/2022 0845   BUN 10 08/08/2022 0845   CREATININE 0.74 08/08/2022 0845   CREATININE 0.78 08/01/2022 0948   CREATININE 1.02 08/29/2021 1408   CALCIUM 9.1 08/08/2022 0845   PROT 5.9 (L) 08/08/2022 0845   ALBUMIN 3.7 08/08/2022 0845   AST 21 08/08/2022 0845   AST 23 08/01/2022 0948   ALT 25 08/08/2022 0845   ALT 27 08/01/2022 0948   ALKPHOS 58 08/08/2022 0845   BILITOT 0.3 08/08/2022 0845   BILITOT 0.3 08/01/2022 0948   GFRNONAA >60 08/08/2022 0845   GFRNONAA >60 08/01/2022 0948    GFRNONAA 63 11/01/2020 1414   GFRAA 73 11/01/2020 1414       PENDING LABS:   RADIOGRAPHIC STUDIES:  No results found.   PATHOLOGY:     ASSESSMENT and THERAPY PLAN:   No problem-specific Assessment & Plan notes found for this encounter.   No orders of the defined types were placed in this encounter.   All questions were answered. The patient knows to call the clinic with any problems, questions or concerns. We can certainly see the patient much sooner if necessary. This note was electronically signed. Scot Dock, NP 08/08/2022

## 2022-08-08 NOTE — Telephone Encounter (Signed)
Called pt to f/u and make aware MRI was approved by insurance. Advised if there were any questions to call office

## 2022-08-08 NOTE — Telephone Encounter (Signed)
Called patient to schedule a consultation w. Veronica Robles, patient currently in infusion. LVM for a return call.

## 2022-08-09 ENCOUNTER — Ambulatory Visit
Admission: RE | Admit: 2022-08-09 | Discharge: 2022-08-09 | Disposition: A | Discharge status: Home / Self Care | Payer: Medicare HMO | Source: Ambulatory Visit | Attending: Hematology and Oncology | Admitting: Hematology and Oncology

## 2022-08-09 DIAGNOSIS — C50412 Malignant neoplasm of upper-outer quadrant of left female breast: Secondary | ICD-10-CM

## 2022-08-09 MED ORDER — GADOPICLENOL 0.5 MMOL/ML IV SOLN
6.0000 mL | Freq: Once | INTRAVENOUS | Status: AC | PRN
  Administered 2022-08-09: 6 mL via INTRAVENOUS

## 2022-08-10 ENCOUNTER — Encounter: Payer: Self-pay | Admitting: Adult Health

## 2022-08-10 ENCOUNTER — Encounter: Payer: Self-pay | Admitting: Hematology and Oncology

## 2022-08-10 NOTE — Assessment & Plan Note (Signed)
Veronica Robles is a 67 year-old woman with stage IIA ER positive breast cancer diagnosed in 02/2022 continuing on neoadjuvant chemotherapy.  She has completed the first four cycles of neoadjuvant Adriamycin and Cytoxan and now continues on weekly Taxol.    [name] will proceed with her final week of Taxol.  She is set for MRI this weekend and her surgery is scheduled.  I reviewed that her labs are stable.  She will be discussed at conference either on 1/17 or 1/24. I will reach out to our navigators to find out when and we will schedule her to talk to Dr. Lindi Adie after.

## 2022-08-11 ENCOUNTER — Telehealth: Payer: Self-pay | Admitting: Radiation Oncology

## 2022-08-11 NOTE — Telephone Encounter (Signed)
Called patient to schedule a consultation w. Bryson Ha. No answer, LVM for a return call.

## 2022-08-12 ENCOUNTER — Encounter: Payer: Self-pay | Admitting: Internal Medicine

## 2022-08-12 ENCOUNTER — Ambulatory Visit (INDEPENDENT_AMBULATORY_CARE_PROVIDER_SITE_OTHER): Payer: Medicare HMO | Admitting: Internal Medicine

## 2022-08-12 ENCOUNTER — Other Ambulatory Visit: Payer: Self-pay

## 2022-08-12 VITALS — BP 125/75 | HR 90 | Resp 16 | Ht 62.0 in | Wt 123.0 lb

## 2022-08-12 DIAGNOSIS — Z79899 Other long term (current) drug therapy: Secondary | ICD-10-CM

## 2022-08-12 DIAGNOSIS — Z170 Estrogen receptor positive status [ER+]: Secondary | ICD-10-CM | POA: Diagnosis not present

## 2022-08-12 DIAGNOSIS — C50412 Malignant neoplasm of upper-outer quadrant of left female breast: Secondary | ICD-10-CM

## 2022-08-12 DIAGNOSIS — B20 Human immunodeficiency virus [HIV] disease: Secondary | ICD-10-CM | POA: Diagnosis not present

## 2022-08-12 MED ORDER — SULFAMETHOXAZOLE-TRIMETHOPRIM 200-40 MG/5ML PO SUSP: 20.0000 mL | Freq: Every day | ORAL | 5 refills | Status: DC

## 2022-08-12 NOTE — Progress Notes (Signed)
RFV: follow up for hiv disease  Patient ID: Veronica Robles, female   DOB: 1955/09/14, 67 y.o.   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   She has completed the first four cycles of neoadjuvant Adriamycin and Cytoxan and now continues on weekly Taxol. Last weekly dose of taxol this week with next stage for surgery, January 31st possibly.   Since she last saw cassie, not missing doses.  Has not disclosed to her son (who has had 2 heart surgery)      Outpatient Encounter Medications as of 08/12/2022  Medication Sig   Blood Pressure Monitoring (ADULT BLOOD PRESSURE CUFF LG) KIT     emtricitabine-tenofovir AF (DESCOVY) 200-25 MG tablet Take 1 tablet by mouth daily.   fostemsavir tromethamine (RUKOBIA) 600 MG TB12 ER tablet Take 1 tablet (600 mg total) by mouth 2 (two) times daily.   hydrochlorothiazide (HYDRODIURIL) 25 MG tablet Take 1 tablet (25 mg total) by mouth daily.   lidocaine-prilocaine (EMLA) cream Apply to affected area once   ondansetron (ZOFRAN) 8 MG tablet Take 1 tablet (8 mg) by mouth every 8 hours as needed for nausea/vomiting. Start third day after doxorubicin/cyclophosphamide chemotherapy.   SQ injection lenacapavir (SUNLENCA) 463.5 MG/1.5ML SQ injection Inject 3 mLs (927 mg total) into the skin every 6 (six) months. Administer each injection subcutaneously at separate sites in the abdomen (more or equal to 2 inches from the navel).   sulfamethoxazole-trimethoprim (BACTRIM) 200-40 MG/5ML suspension Take 20 mLs by mouth daily.   prochlorperazine (COMPAZINE) 10 MG tablet Take 1 tablet (10 mg total) by mouth every 6 (six) hours as needed for nausea or vomiting. (Patient not  taking: Reported on 08/12/2022)   Facility-Administered Encounter Medications as of 08/12/2022  Medication   ciprofloxacin (CIPRO) IVPB 400 mg     Patient Active Problem List   Diagnosis Date Noted   Port-A-Cath in place 08/08/2022   Family history of breast cancer 03/20/2022   Family history of pancreatic cancer 03/20/2022   Gastroesophageal reflux disease without esophagitis 03/12/2022   Weight loss 03/12/2022   Malignant neoplasm of upper-outer quadrant of left breast in female, estrogen receptor positive ([place]) 03/07/2022   Dental caries 12/01/2021   Other viral warts 12/01/2021   Healthcare maintenance 11/09/2018   VISUAL IMPAIRMENT 10/01/2007   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   HYPERTENSION, BENIGN 01/26/2007   SHINGLES, RECURRENT 12/08/2006   HIV disease ([place]) 10/26/2006   HX, PERSONAL, PENICILLIN ALLERGY 10/26/2006   HX, PERSONAL, DRUG ALLERGY NOS 10/26/2006     Health Maintenance Due  Topic Date Due   Medicare Annual Wellness (AWV)  Never done   Zoster Vaccines- Shingrix (1 of 2) Never done   COLONOSCOPY (Pts 45-67yr Insurance coverage will need to be confirmed)  Never done   DEXA SCAN  Never done   COVID-19 Vaccine (6 - 2023-24 season) 07/08/2022   Pneumonia Vaccine 67 Years old (453- PPSV23 or PCV20) 10/14/2022     Review of Systems +weight loss but keeps trying to keep up nutrition. Otherwise 12 point ros is negative Physical Exam   BP 125/75   Pulse 90   Resp 16   Ht '5\' 2"'$  (1.575 m)   Wt 123  lb (55.8 kg)   SpO2 100%   BMI 22.50 kg/m   Physical Exam  Constitutional:  oriented to person, place, and time. appears well-developed and well-nourished. No distress.  HENT: [place]/AT, PERRLA, no scleral icterus Mouth/Throat: Oropharynx is clear and moist. No oropharyngeal exudate.  Cardiovascular: Normal rate, regular rhythm and normal heart sounds. Exam reveals no gallop and no friction rub.  No murmur heard.  Pulmonary/Chest: Effort normal and breath sounds  normal. No respiratory distress.  has no wheezes.  Neck = supple, no nuchal rigidity Abdominal: Soft. Bowel sounds are normal.  exhibits no distension. There is no tenderness.  Lymphadenopathy: no cervical adenopathy. No axillary adenopathy Neurological: alert and oriented to person, place, and time.  Skin: Skin is warm and dry. No rash noted. No erythema.  Psychiatric: a normal mood and affect.  behavior is normal.   Lab Results  Component Value Date   CD4TCELL 21 (L) 03/21/2022   Lab Results  Component Value Date   CD4TABS 302 (L) 06/13/2021   CD4TABS 278 (L) 11/01/2020   CD4TABS 360 (L) 05/01/2020   Lab Results  Component Value Date   HIV1RNAQUANT <20 (H) 05/13/2022   Lab Results  Component Value Date   HEPBSAB INDETER (A) 08/08/2009   Lab Results  Component Value Date   LABRPR NON-REACTIVE 05/01/2020    CBC Lab Results  Component Value Date   WBC 2.9 (L) 08/08/2022   RBC 3.20 (L) 08/08/2022   HGB 10.1 (L) 08/08/2022   HCT 30.2 (L) 08/08/2022   PLT 156 08/08/2022   MCV 94.4 08/08/2022   MCH 31.6 08/08/2022   MCHC 33.4 08/08/2022   RDW 12.8 08/08/2022   LYMPHSABS 0.9 08/08/2022   MONOABS 0.4 08/08/2022   EOSABS 0.0 08/08/2022    BMET Lab Results  Component Value Date   NA 140 08/08/2022   K 3.9 08/08/2022   CL 108 08/08/2022   CO2 29 08/08/2022   GLUCOSE 99 08/08/2022   BUN 10 08/08/2022   CREATININE 0.74 08/08/2022   CALCIUM 9.1 08/08/2022   GFRNONAA >60 08/08/2022   GFRAA 73 11/01/2020    Assessment and Plan HIV disease= Will check CD 4 count and VL; plan to continue for  Continue bactrim for oi prophylaxis. She uses pharmacy at  [facility] term medication = cr is stable  Breast ca = continues to undergo treatment-finshed neoadjuvant therapy but next steps maybe surgery  Hiv disclosure == still no one knows from her close family about her hiv status and she is not ready to disclose to her son.

## 2022-08-13 ENCOUNTER — Other Ambulatory Visit: Payer: Self-pay

## 2022-08-13 LAB — T-HELPER CELL (CD4) - (RCID CLINIC ONLY)
CD4 % Helper T Cell: 17 % — ABNORMAL LOW (ref 33–65)
CD4 T Cell Abs: 128 /uL — ABNORMAL LOW (ref 400–1790)

## 2022-08-14 ENCOUNTER — Ambulatory Visit (INDEPENDENT_AMBULATORY_CARE_PROVIDER_SITE_OTHER): Payer: Medicare HMO

## 2022-08-14 ENCOUNTER — Encounter: Payer: Self-pay | Admitting: *Deleted

## 2022-08-14 ENCOUNTER — Telehealth: Payer: Self-pay | Admitting: Hematology and Oncology

## 2022-08-14 VITALS — Ht 62.0 in | Wt 123.0 lb

## 2022-08-14 DIAGNOSIS — Z0000 Encounter for general adult medical examination without abnormal findings: Secondary | ICD-10-CM | POA: Diagnosis not present

## 2022-08-14 LAB — HIV-1 RNA QUANT-NO REFLEX-BLD
HIV 1 RNA Quant: <20 Copies/mL — ABNORMAL HIGH
HIV-1 RNA Quant, Log: <1.3 Log cps/mL — ABNORMAL HIGH

## 2022-08-14 NOTE — Patient Instructions (Signed)
Veronica Robles , Thank you for taking time to come for your Medicare Wellness Visit. I appreciate your ongoing commitment to your health goals. Please review the following plan we discussed and let me know if I can assist you in the future.   These are the goals we discussed:  Goals      Patient Stated     08/14/2022, wants to get better and regain energy        This is a list of the screening recommended for you and due dates:  Health Maintenance  Topic Date Due   Zoster (Shingles) Vaccine (1 of 2) Never done   Colon Cancer Screening  Never done   DEXA scan (bone density measurement)  Never done   COVID-19 Vaccine (6 - 2023-24 season) 07/08/2022   Pneumonia Vaccine (4 - PPSV23 or PCV20) 10/14/2022   Medicare Annual Wellness Visit  08/15/2023   Mammogram  02/15/2024   DTaP/Tdap/Td vaccine (3 - Td or Tdap) 12/03/2026   Flu Shot  Completed   Hepatitis C Screening: USPSTF Recommendation to screen - Ages 18-79 yo.  Completed   HPV Vaccine  Aged Out    Advanced directives: Advance directive discussed with you today.    Conditions/risks identified: none  Next appointment: Follow up in one year for your annual wellness visit    Preventive Care 65 Years and Older, Female Preventive care refers to lifestyle choices and visits with your health care provider that can promote health and wellness. What does preventive care include? A yearly physical exam. This is also called an annual well check. Dental exams once or twice a year. Routine eye exams. Ask your health care provider how often you should have your eyes checked. Personal lifestyle choices, including: Daily care of your teeth and gums. Regular physical activity. Eating a healthy diet. Avoiding tobacco and drug use. Limiting alcohol use. Practicing safe sex. Taking low-dose aspirin every day. Taking vitamin and mineral supplements as recommended by your health care provider. What happens during an annual well check? The  services and screenings done by your health care provider during your annual well check will depend on your age, overall health, lifestyle risk factors, and family history of disease. Counseling  Your health care provider may ask you questions about your: Alcohol use. Tobacco use. Drug use. Emotional well-being. Home and relationship well-being. Sexual activity. Eating habits. History of falls. Memory and ability to understand (cognition). Work and work Statistician. Reproductive health. Screening  You may have the following tests or measurements: Height, weight, and BMI. Blood pressure. Lipid and cholesterol levels. These may be checked every 5 years, or more frequently if you are over 42 years old. Skin check. Lung cancer screening. You may have this screening every year starting at age 36 if you have a 30-pack-year history of smoking and currently smoke or have quit within the past 15 years. Fecal occult blood test (FOBT) of the stool. You may have this test every year starting at age 64. Flexible sigmoidoscopy or colonoscopy. You may have a sigmoidoscopy every 5 years or a colonoscopy every 10 years starting at age 54. Hepatitis C blood test. Hepatitis B blood test. Sexually transmitted disease (STD) testing. Diabetes screening. This is done by checking your blood sugar (glucose) after you have not eaten for a while (fasting). You may have this done every 1-3 years. Bone density scan. This is done to screen for osteoporosis. You may have this done starting at age 23. Mammogram. This may be  done every 1-2 years. Talk to your health care provider about how often you should have regular mammograms. Talk with your health care provider about your test results, treatment options, and if necessary, the need for more tests. Vaccines  Your health care provider may recommend certain vaccines, such as: Influenza vaccine. This is recommended every year. Tetanus, diphtheria, and acellular  pertussis (Tdap, Td) vaccine. You may need a Td booster every 10 years. Zoster vaccine. You may need this after age 74. Pneumococcal 13-valent conjugate (PCV13) vaccine. One dose is recommended after age 67. Pneumococcal polysaccharide (PPSV23) vaccine. One dose is recommended after age 1. Talk to your health care provider about which screenings and vaccines you need and how often you need them. This information is not intended to replace advice given to you by your health care provider. Make sure you discuss any questions you have with your health care provider. Document Released: 08/10/2015 Document Revised: 04/02/2016 Document Reviewed: 05/15/2015 Elsevier Interactive Patient Education  2017 [place] Prevention in the Home Falls can cause injuries. They can happen to people of all ages. There are many things you can do to make your home safe and to help prevent falls. What can I do on the outside of my home? Regularly fix the edges of walkways and driveways and fix any cracks. Remove anything that might make you trip as you walk through a door, such as a raised step or threshold. Trim any bushes or trees on the path to your home. Use bright outdoor lighting. Clear any walking paths of anything that might make someone trip, such as rocks or tools. Regularly check to see if handrails are loose or broken. Make sure that both sides of any steps have handrails. Any raised decks and porches should have guardrails on the edges. Have any leaves, snow, or ice cleared regularly. Use sand or salt on walking paths during winter. Clean up any spills in your garage right away. This includes oil or grease spills. What can I do in the bathroom? Use night lights. Install grab bars by the toilet and in the tub and shower. Do not use towel bars as grab bars. Use non-skid mats or decals in the tub or shower. If you need to sit down in the shower, use a plastic, non-slip stool. Keep the floor  dry. Clean up any water that spills on the floor as soon as it happens. Remove soap buildup in the tub or shower regularly. Attach bath mats securely with double-sided non-slip rug tape. Do not have throw rugs and other things on the floor that can make you trip. What can I do in the bedroom? Use night lights. Make sure that you have a light by your bed that is easy to reach. Do not use any sheets or blankets that are too big for your bed. They should not hang down onto the floor. Have a firm chair that has side arms. You can use this for support while you get dressed. Do not have throw rugs and other things on the floor that can make you trip. What can I do in the kitchen? Clean up any spills right away. Avoid walking on wet floors. Keep items that you use a lot in easy-to-reach places. If you need to reach something above you, use a strong step stool that has a grab bar. Keep electrical cords out of the way. Do not use floor polish or wax that makes floors slippery. If you must use wax,  use non-skid floor wax. Do not have throw rugs and other things on the floor that can make you trip. What can I do with my stairs? Do not leave any items on the stairs. Make sure that there are handrails on both sides of the stairs and use them. Fix handrails that are broken or loose. Make sure that handrails are as long as the stairways. Check any carpeting to make sure that it is firmly attached to the stairs. Fix any carpet that is loose or worn. Avoid having throw rugs at the top or bottom of the stairs. If you do have throw rugs, attach them to the floor with carpet tape. Make sure that you have a light switch at the top of the stairs and the bottom of the stairs. If you do not have them, ask someone to add them for you. What else can I do to help prevent falls? Wear shoes that: Do not have high heels. Have rubber bottoms. Are comfortable and fit you well. Are closed at the toe. Do not wear  sandals. If you use a stepladder: Make sure that it is fully opened. Do not climb a closed stepladder. Make sure that both sides of the stepladder are locked into place. Ask someone to hold it for you, if possible. Clearly mark and make sure that you can see: Any grab bars or handrails. First and last steps. Where the edge of each step is. Use tools that help you move around (mobility aids) if they are needed. These include: Canes. Walkers. Scooters. Crutches. Turn on the lights when you go into a dark area. Replace any light bulbs as soon as they burn out. Set up your furniture so you have a clear path. Avoid moving your furniture around. If any of your floors are uneven, fix them. If there are any pets around you, be aware of where they are. Review your medicines with your doctor. Some medicines can make you feel dizzy. This can increase your chance of falling. Ask your doctor what other things that you can do to help prevent falls. This information is not intended to replace advice given to you by your health care provider. Make sure you discuss any questions you have with your health care provider. Document Released: 05/10/2009 Document Revised: 12/20/2015 Document Reviewed: 08/18/2014 Elsevier Interactive Patient Education  2017 [employer].

## 2022-08-14 NOTE — Telephone Encounter (Signed)
Called patient per 1/17 in basket. Left voicemail with new appointment information.

## 2022-08-14 NOTE — Progress Notes (Signed)
I connected with Veronica Robles today by telephone and verified that I am speaking with the correct person using two identifiers. Location patient: home Location provider: work Persons participating in the virtual visit: Veronica Robles, Glenna Durand LPN.   I discussed the limitations, risks, security and privacy concerns of performing an evaluation and management service by telephone and the availability of in person appointments. I also discussed with the patient that there may be a patient responsible charge related to this service. The patient expressed understanding and verbally consented to this telephonic visit.    Interactive audio and video telecommunications were attempted between this provider and patient, however failed, due to patient having technical difficulties OR patient did not have access to video capability.  We continued and completed visit with audio only.     Vital signs may be patient reported or missing.  Subjective:   Veronica Robles is a 67 y.o. female who presents for an Initial Medicare Annual Wellness Visit.  Review of Systems     Cardiac Risk Factors include: advanced age (>20mn, >>6women);hypertension     Objective:    Today's Vitals   08/14/22 1026  Weight: 123 lb (55.8 kg)  Height: '5\' 2"'$  (1.575 m)   Body mass index is 22.5 kg/m.     08/14/2022   10:30 AM 07/18/2022   10:29 AM 07/11/2022    8:44 AM 07/04/2022    8:20 AM 06/05/2022    9:56 AM 05/22/2022   11:43 AM 05/22/2022   10:13 AM  Advanced Directives  Does Patient Have a Medical Advance Directive? No No No No No No No  Would patient like information on creating a medical advance directive?  No - Patient declined No - Patient declined No - Patient declined No - Patient declined No - Patient declined No - Patient declined    Current Medications (verified) Outpatient Encounter Medications as of 08/14/2022  Medication Sig   Blood Pressure Monitoring (ADULT BLOOD PRESSURE CUFF LG) KIT      emtricitabine-tenofovir AF (DESCOVY) 200-25 MG tablet Take 1 tablet by mouth daily.   fostemsavir tromethamine (RUKOBIA) 600 MG TB12 ER tablet Take 1 tablet (600 mg total) by mouth 2 (two) times daily.   hydrochlorothiazide (HYDRODIURIL) 25 MG tablet Take 1 tablet (25 mg total) by mouth daily.   lidocaine-prilocaine (EMLA) cream Apply to affected area once   ondansetron (ZOFRAN) 8 MG tablet Take 1 tablet (8 mg) by mouth every 8 hours as needed for nausea/vomiting. Start third day after doxorubicin/cyclophosphamide chemotherapy.   SQ injection lenacapavir (SUNLENCA) 463.5 MG/1.5ML SQ injection Inject 3 mLs (927 mg total) into the skin every 6 (six) months. Administer each injection subcutaneously at separate sites in the abdomen (more or equal to 2 inches from the navel).   sulfamethoxazole-trimethoprim (BACTRIM) 200-40 MG/5ML suspension Take 20 mLs by mouth daily.   prochlorperazine (COMPAZINE) 10 MG tablet Take 1 tablet (10 mg total) by mouth every 6 (six) hours as needed for nausea or vomiting. (Patient not taking: Reported on 08/12/2022)   Facility-Administered Encounter Medications as of 08/14/2022  Medication   ciprofloxacin (CIPRO) IVPB 400 mg    Allergies (verified) Penicillins   History: Past Medical History:  Diagnosis Date   [place]   left breast IDC with DCIS   Family history of breast cancer 03/20/2022   Family history of pancreatic cancer 03/20/2022   [place] 1993   Hypertension    Past Surgical History:  Procedure Laterality Date  ABDOMINAL HYSTERECTOMY     APPENDECTOMY     BREAST BIOPSY Right 2015   CYSTOSCOPY/RETROGRADE/URETEROSCOPY Right 11/14/2016   Procedure: CYSTOSCOPY/RETROGRADE/URETEROSCOPY/ STONE EXTRACTION/HOLMIUM LASER/STENT PLACEMENT;  Surgeon: Kathie Rhodes, MD;  Location: [facility];  Service: Urology;  Laterality: Right;   PORTACATH PLACEMENT Right 03/26/2022   Procedure: INSERTION PORT-A-CATH;  Surgeon: [name], MD;   Location: [facility];  Service: General;  Laterality: Right;   Family History  Problem Relation Age of Onset   Hypertension Mother    Pancreatic cancer Father 2   Breast cancer Sister        dx 70s   Lung cancer Sister        d. 82   Social History   Socioeconomic History   Marital status: Single    Spouse name: Not on file   Number of children: Not on file   Years of education: Not on file   Highest education level: Not on file  Occupational History   Not on file  Tobacco Use   Smoking status: Former    Packs/day: 0.10    Types: Cigarettes   Smokeless tobacco: Never  Vaping Use   Vaping Use: Never used  Substance and Sexual Activity   Alcohol use: No    Alcohol/week: 0.0 standard drinks of alcohol   Drug use: No   Sexual activity: Not Currently    Partners: Male    Birth control/protection: Surgical    Comment: declined condoms  Other Topics Concern   Not on file  Social History Narrative   Not on file   Social Determinants of Health   Financial Resource Strain: Low Risk  (08/14/2022)   Overall Financial Resource Strain (CARDIA)    Difficulty of Paying Living Expenses: Not hard at all  Food Insecurity: No Food Insecurity (08/14/2022)   Hunger Vital Sign    Worried About Running Out of Food in the Last Year: Never true    [place] in the Last Year: Never true  Transportation Needs: No Transportation Needs (08/14/2022)   PRAPARE - Hydrologist (Medical): No    Lack of Transportation (Non-Medical): No  Physical Activity: Inactive (08/14/2022)   Exercise Vital Sign    Days of Exercise per Week: 0 days    Minutes of Exercise per Session: 0 min  Stress: No Stress Concern Present (08/14/2022)   [place]    Feeling of Stress : Not at all  Social Connections: Not on file    Tobacco Counseling Counseling given: Not Answered   Clinical  Intake:  Pre-visit preparation completed: Yes  Pain : No/denies pain     Nutritional Status: BMI of 19-24  Normal Nutritional Risks: Nausea/ vomitting/ diarrhea (nausea sometimes) Diabetes: No  How often do you need to have someone help you when you read instructions, pamphlets, or other written materials from your doctor or pharmacy?: 1 - Never  Diabetic? no  Interpreter Needed?: No  Information entered by :: NAllen LPN   Activities of Daily Living    08/14/2022   10:31 AM 03/26/2022    6:26 AM  In your present state of health, do you have any difficulty performing the following activities:  Hearing? 0 0  Vision? 0 0  Difficulty concentrating or making decisions? 0 0  Walking or climbing stairs? 0 0  Dressing or bathing? 0 0  Doing errands, shopping? 0   Preparing Food and eating ?  N   Using the Toilet? N   In the past six months, have you accidently leaked urine? N   Do you have problems with loss of bowel control? N   Managing your Medications? N   Managing your Finances? N   Housekeeping or managing your Housekeeping? N     Patient Care Team: [name], MD as PCP - General (Family Medicine) Carlyle Basques, MD as PCP - Infectious Diseases (Infectious Diseases) Rockwell Germany, RN as Oncology Nurse Navigator Mauro Kaufmann, RN as Oncology Nurse Navigator Nicholas Lose, MD as Consulting Physician (Hematology and Oncology)  Indicate any recent Medical Services you may have received from other than [facility] providers in the past year (date may be approximate).     Assessment:   This is a routine wellness examination for [place].  Hearing/Vision screen Vision Screening - Comments:: No regular eye exams,  Dietary issues and exercise activities discussed: Current Exercise Habits: The patient does not participate in regular exercise at present   Goals Addressed             This Visit's Progress    Patient Stated       08/14/2022, wants to get better  and regain energy       Depression Screen    08/14/2022   10:31 AM 08/12/2022    3:02 PM 06/09/2022    3:03 PM 04/28/2022    3:19 PM 04/03/2022    3:51 PM 11/21/2021    1:34 PM 09/17/2021    2:44 PM  PHQ 2/9 Scores  PHQ - 2 Score 0 0 0 0 0 0 1  PHQ- 9 Score    0     Exception Documentation      Medical reason     Fall Risk    08/14/2022   10:31 AM 08/12/2022    3:02 PM 06/09/2022    2:49 PM 04/03/2022    3:51 PM 11/21/2021    1:34 PM  [place] in the past year? 0 0 0 0 0  Number falls in past yr: 0 0  0 0  Injury with Fall? 0 0  0 0  Risk for fall due to : Medication side effect  No Fall Risks No Fall Risks No Fall Risks  Follow up Falls prevention discussed;Education provided;Falls evaluation completed  Falls evaluation completed Falls evaluation completed Falls evaluation completed;Education provided    FALL RISK PREVENTION PERTAINING TO THE HOME:  Any stairs in or around the home? No  If so, are there any without handrails? N/a Home free of loose throw rugs in walkways, pet beds, electrical cords, etc? Yes  Adequate lighting in your home to reduce risk of falls? Yes   ASSISTIVE DEVICES UTILIZED TO PREVENT FALLS:  Life alert? No  Use of a cane, walker or w/c? No  Grab bars in the bathroom? No  Shower chair or bench in shower? No  Elevated toilet seat or a handicapped toilet? No   TIMED UP AND GO:  Was the test performed? No .       Cognitive Function:        08/14/2022   10:32 AM  6CIT Screen  What Year? 0 points  What month? 0 points  What time? 0 points  Count back from 20 0 points  Months in reverse 0 points  Repeat phrase 0 points  Total Score 0 points    Immunizations Immunization History  Administered Date(s) Administered   COVID-19, mRNA,  vaccine(Comirnaty)12 years and older 05/13/2022   Fluad Quad(high Dose 65+) 04/29/2022   Hepatitis B 02/02/2013   Hepatitis B, adult 03/04/2013, 09/06/2013   Influenza Split 05/12/2011, 05/21/2012    Influenza Whole 05/09/2008, 05/15/2009, 04/25/2010   Influenza,inj,Quad PF,6+ Mos 06/02/2013, 06/13/2014, 05/24/2015, 03/27/2016, 05/11/2017, 05/10/2018, 05/09/2019   PFIZER Comirnaty(Gray Top)Covid-19 Tri-Sucrose Vaccine 02/19/2021   PFIZER(Purple Top)SARS-COV-2 Vaccination 09/29/2019, 10/29/2019, 05/28/2020   Pneumococcal Conjugate-13 05/10/2018   Pneumococcal Polysaccharide-23 05/02/2005, 09/12/2010, 10/13/2017   Td 04/27/2002   Tdap 12/02/2016    TDAP status: Up to date  Flu Vaccine status: Up to date  Pneumococcal vaccine status: Up to date  Covid-19 vaccine status: Completed vaccines  Qualifies for Shingles Vaccine? Yes   Zostavax completed No   Shingrix Completed?: No.    Education has been provided regarding the importance of this vaccine. Patient has been advised to call insurance company to determine out of pocket expense if they have not yet received this vaccine. Advised may also receive vaccine at local pharmacy or Health Dept. Verbalized acceptance and understanding.  Screening Tests Health Maintenance  Topic Date Due   Medicare Annual Wellness (AWV)  Never done   Zoster Vaccines- Shingrix (1 of 2) Never done   COLONOSCOPY (Pts 45-53yr Insurance coverage will need to be confirmed)  Never done   DEXA SCAN  Never done   COVID-19 Vaccine (6 - 2023-24 season) 07/08/2022   Pneumonia Vaccine 67 Years old (4 - PPSV23 or PCV20) 10/14/2022   MAMMOGRAM  02/15/2024   DTaP/Tdap/Td (3 - Td or Tdap) 12/03/2026   INFLUENZA VACCINE  Completed   Hepatitis C Screening  Completed   HPV VACCINES  Aged Out    Health Maintenance  Health Maintenance Due  Topic Date Due   Medicare Annual Wellness (AWV)  Never done   Zoster Vaccines- Shingrix (1 of 2) Never done   COLONOSCOPY (Pts 45-461yrInsurance coverage will need to be confirmed)  Never done   DEXA SCAN  Never done   COVID-19 Vaccine (6 - 2023-24 season) 07/08/2022   Pneumonia Vaccine 6544Years old (4 40 PPSV23 or PCV20)  10/14/2022    Colorectal cancer screening: due  Mammogram status: Completed 02/14/2022. Repeat every year  Bone Density status: due  Lung Cancer Screening: (Low Dose CT Chest recommended if Age 67-80ears, 30 pack-year currently smoking OR have quit w/in 15years.) does not qualify.   Lung Cancer Screening Referral: no  Additional Screening:  Hepatitis C Screening: does qualify; Completed 05/02/2013  Vision Screening: Recommended annual ophthalmology exams for early detection of glaucoma and other disorders of the eye. Is the patient up to date with their annual eye exam?  No  Who is the provider or what is the name of the office in which the patient attends annual eye exams? none If pt is not established with a provider, would they like to be referred to a provider to establish care? No .   Dental Screening: Recommended annual dental exams for proper oral hygiene  Community Resource Referral / Chronic Care Management: CRR required this visit?  No   CCM required this visit?  No      Plan:     I have personally reviewed and noted the following in the patient's chart:   Medical and social history Use of alcohol, tobacco or illicit drugs  Current medications and supplements including opioid prescriptions. Patient is not currently taking opioid prescriptions. Functional ability and status Nutritional status Physical activity Advanced directives List of other physicians Hospitalizations,  surgeries, and ER visits in previous 12 months Vitals Screenings to include cognitive, depression, and falls Referrals and appointments  In addition, I have reviewed and discussed with patient certain preventive protocols, quality metrics, and best practice recommendations. A written personalized care plan for preventive services as well as general preventive health recommendations were provided to patient.     Kellie Simmering, LPN   5/88/5027   Nurse Notes: none

## 2022-08-18 ENCOUNTER — Other Ambulatory Visit: Payer: Self-pay

## 2022-08-18 ENCOUNTER — Inpatient Hospital Stay (HOSPITAL_BASED_OUTPATIENT_CLINIC_OR_DEPARTMENT_OTHER): Payer: Medicare HMO | Admitting: Hematology and Oncology

## 2022-08-18 VITALS — BP 142/74 | HR 89 | Temp 97.4°F | Resp 18 | Ht 62.0 in | Wt 123.9 lb

## 2022-08-18 DIAGNOSIS — C50412 Malignant neoplasm of upper-outer quadrant of left female breast: Secondary | ICD-10-CM

## 2022-08-18 DIAGNOSIS — Z170 Estrogen receptor positive status [ER+]: Secondary | ICD-10-CM

## 2022-08-18 DIAGNOSIS — Z5111 Encounter for antineoplastic chemotherapy: Secondary | ICD-10-CM | POA: Diagnosis not present

## 2022-08-18 NOTE — Progress Notes (Signed)
Patient Care Team: [name], MD as PCP - General (Family Medicine) Carlyle Basques, MD as PCP - Infectious Diseases (Infectious Diseases) Rockwell Germany, RN as Oncology Nurse Navigator Mauro Kaufmann, RN as Oncology Nurse Navigator Nicholas Lose, MD as Consulting Physician (Hematology and Oncology)  DIAGNOSIS:  Encounter Diagnosis  Name Primary?   Malignant neoplasm of upper-outer quadrant of left breast in female, estrogen receptor positive ([name]) Yes    SUMMARY OF ONCOLOGIC HISTORY: Oncology History  Malignant neoplasm of upper-outer quadrant of left breast in female, estrogen receptor positive ([name])  02/28/2022 Initial Diagnosis   Screening detected left breast mass at 3 o'clock position measuring 0.9 cm, 4 enlarged left axillary lymph nodes along with asymmetric ducts between mass and the nipple possibly DCIS; lymph node biopsy: Positive, breast biopsy: Grade 2 IDC ER 100%, PR 0%, HER2 negative, Ki-67 10%   03/10/2022 Cancer Staging   Staging form: Breast, AJCC 8th Edition - Clinical stage from 03/10/2022: Stage IIA (cT1b, cN1(f), cM0, G2, ER+, PR-, HER2-) - Signed by Hayden Pedro, PA-C on 03/10/2022 Stage prefix: Initial diagnosis Method of lymph node assessment: Core biopsy Histologic grading system: 3 grade system   03/27/2022 - 08/08/2022 Chemotherapy   Patient is on Treatment Plan : BREAST ADJUVANT DOSE DENSE AC q14d / PACLitaxel q7d        CHIEF COMPLIANT:  Follow up Mri report  INTERVAL HISTORY: Veronica Robles is a 67 year old with above-mentioned history of left breast cancer is currently on adjuvant chemotherapy Taxol. She presents to the clinic for a follow-up to discuss Mri. She reports no new concerns to the clinic today. She say taste and appetite is good. She denies any numbness and tingling in fingers and toes.   ALLERGIES:  is allergic to penicillins.  MEDICATIONS:  Current Outpatient Medications  Medication Sig Dispense Refill   Blood  Pressure Monitoring (ADULT BLOOD PRESSURE CUFF LG) KIT       emtricitabine-tenofovir AF (DESCOVY) 200-25 MG tablet Take 1 tablet by mouth daily. 30 tablet 5   fostemsavir tromethamine (RUKOBIA) 600 MG TB12 ER tablet Take 1 tablet (600 mg total) by mouth 2 (two) times daily. 60 tablet 5   hydrochlorothiazide (HYDRODIURIL) 25 MG tablet Take 1 tablet (25 mg total) by mouth daily. 90 tablet 1   lidocaine-prilocaine (EMLA) cream Apply to affected area once 30 g 3   ondansetron (ZOFRAN) 8 MG tablet Take 1 tablet (8 mg) by mouth every 8 hours as needed for nausea/vomiting. Start third day after doxorubicin/cyclophosphamide chemotherapy. 30 tablet 1   prochlorperazine (COMPAZINE) 10 MG tablet Take 1 tablet (10 mg total) by mouth every 6 (six) hours as needed for nausea or vomiting. (Patient not taking: Reported on 08/12/2022) 30 tablet 1   SQ injection lenacapavir (SUNLENCA) 463.5 MG/1.5ML SQ injection Inject 3 mLs (927 mg total) into the skin every 6 (six) months. Administer each injection subcutaneously at separate sites in the abdomen (more or equal to 2 inches from the navel). 3 mL 2   sulfamethoxazole-trimethoprim (BACTRIM) 200-40 MG/5ML suspension Take 20 mLs by mouth daily. 473 mL 5   No current facility-administered medications for this visit.   Facility-Administered Medications Ordered in Other Visits  Medication Dose Route Frequency Provider Last Rate Last Admin   ciprofloxacin (CIPRO) IVPB 400 mg  400 mg Intravenous Q12H Kathie Rhodes, MD   400 mg at 03/26/22 7948    PHYSICAL EXAMINATION: ECOG PERFORMANCE STATUS: 1 - Symptomatic but completely ambulatory  Vitals:   08/18/22  1121  BP: (!) 142/74  Pulse: 89  Resp: 18  Temp: (!) 97.4 F (36.3 C)  SpO2: 100%   Filed Weights   08/18/22 1121  Weight: 123 lb 14.4 oz (56.2 kg)    BREAST: No palpable masses or nodules in either right or left breasts  lymphedema of the right breast mass. No palpable axillary supraclavicular or  infraclavicular adenopathy no breast tenderness or nipple discharge. (exam performed in the presence of a chaperone)  LABORATORY DATA:  I have reviewed the data as listed    Latest Ref Rng & Units 08/08/2022    8:45 AM 08/01/2022    9:48 AM 07/25/2022    9:28 AM  CMP  Glucose 70 - 99 mg/dL 99  107  122   BUN 8 - 23 mg/dL '10  9  8   '$ Creatinine 0.44 - 1.00 mg/dL 0.74  0.78  0.84   Sodium 135 - 145 mmol/L 140  142  140   Potassium 3.5 - 5.1 mmol/L 3.9  3.9  3.7   Chloride 98 - 111 mmol/L 108  107  106   CO2 22 - 32 mmol/L '29  30  30   '$ Calcium 8.9 - 10.3 mg/dL 9.1  9.4  9.6   Total Protein 6.5 - 8.1 g/dL 5.9  5.6  6.4   Total Bilirubin 0.3 - 1.2 mg/dL 0.3  0.3  0.3   Alkaline Phos 38 - 126 U/L 58  61  58   AST 15 - 41 U/L '21  23  23   '$ ALT 0 - 44 U/L '25  27  23     '$ Lab Results  Component Value Date   WBC 2.9 (L) 08/08/2022   HGB 10.1 (L) 08/08/2022   HCT 30.2 (L) 08/08/2022   MCV 94.4 08/08/2022   PLT 156 08/08/2022   NEUTROABS 1.6 (L) 08/08/2022    ASSESSMENT & PLAN:  Malignant neoplasm of upper-outer quadrant of left breast in female, estrogen receptor positive ([name]) 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; lymph node biopsy: Positive, breast biopsy: Grade 2 IDC ER 100%, PR 0%, HER2 negative, Ki-67 10%   Treatment plan: 1.  Neoadjuvant chemotherapy with dose dense Adriamycin and Cytoxan x4 followed by Taxol weekly x12 completed 08/08/2022 2. breast conserving surgery with targeted node dissection 3.  Adjuvant radiation 4.  Follow-up antiestrogen therapy with abemaciclib --------------------------------------------------------------------------------------------------------------------------------- Breast MRI 08/11/2022: Interval improvement in size and enhancement of the mass lateral aspect left breast now measuring 1.1 cm (was  1.7 cm) 1 abnormal lymph node seen in the high right axilla (previously there were 4 enlarged lymph nodes)  Radiology review: I discussed the MRI report in great detail after reviewing the images with the patient.  Return to clinic 10 days after surgery to discuss final pathology report.    No orders of the defined types were placed in this encounter.  The patient has a good understanding of the overall plan. she agrees with it. she will call with any problems that may develop before the next visit here. Total time spent: 30 mins including face to face time and time spent for planning, charting and co-ordination of care   Harriette Ohara, MD 08/18/22    I Gardiner Coins am acting as a Education administrator for Dr.Josten Warmuth  I have reviewed the above documentation for accuracy and completeness, and I  agree with the above.

## 2022-08-18 NOTE — Assessment & Plan Note (Signed)
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  Neoadjuvant chemotherapy with dose dense Adriamycin and Cytoxan x4 followed by Taxol weekly x12 completed 08/08/2022 2. breast conserving surgery with targeted node dissection 3.  Adjuvant radiation 4.  Follow-up antiestrogen therapy with abemaciclib --------------------------------------------------------------------------------------------------------------------------------- Breast MRI 08/11/2022: Interval improvement in size and enhancement of the mass lateral aspect left breast now measuring 1.1 cm (was 1.7 cm) 1 abnormal lymph node seen in the high right axilla (previously there were 4 enlarged lymph nodes)  Radiology review: I discussed the MRI report in great detail after reviewing the images with the patient.  Return to clinic 10 days after surgery to discuss final pathology report.

## 2022-08-20 ENCOUNTER — Other Ambulatory Visit: Payer: Self-pay

## 2022-08-20 ENCOUNTER — Encounter (HOSPITAL_BASED_OUTPATIENT_CLINIC_OR_DEPARTMENT_OTHER): Payer: Self-pay | Admitting: Surgery

## 2022-08-22 MED ORDER — ENSURE PRE-SURGERY PO LIQD: 296.0000 mL | Freq: Once | ORAL | Status: DC

## 2022-08-22 NOTE — Progress Notes (Signed)

## 2022-08-26 ENCOUNTER — Ambulatory Visit
Admission: RE | Admit: 2022-08-26 | Discharge: 2022-08-26 | Disposition: A | Discharge status: Home / Self Care | Payer: Medicare HMO | Source: Ambulatory Visit | Attending: Surgery | Admitting: Surgery

## 2022-08-26 ENCOUNTER — Other Ambulatory Visit: Payer: Self-pay | Admitting: Surgery

## 2022-08-26 DIAGNOSIS — C50912 Malignant neoplasm of unspecified site of left female breast: Secondary | ICD-10-CM

## 2022-08-26 HISTORY — PX: BREAST BIOPSY: SHX20

## 2022-08-26 NOTE — H&P (Signed)
PROVIDER: Beverlee Nims, MD  MRN: [id number] DOB: June 02, 1956 DATE OF ENCOUNTER: 08/05/2022 Subjective  Chief Complaint: Breast Cancer   History of Present Illness: Veronica Robles is a 67 y.o. female who is seen for a long-term visit regard to her left breast cancer. She is now finishing her neoadjuvant chemotherapy for her node positive left breast cancer. Her last treatment is soon and she is having a follow-up MRI on the 12th. She is here to discuss surgery. She is already had a fitting for her post lumpectomy compression garment. She reports has been doing very well and has no issues..    Review of Systems: A complete review of systems was obtained from the patient. I have reviewed this information and discussed as appropriate with the patient. See HPI as well for other ROS.  ROS  Medical History: Past Medical History: Diagnosis Date Hypertension  Patient Active Problem List Diagnosis Malignant neoplasm of upper-outer quadrant of left breast in female, estrogen receptor positive Hypertension, benign Asymptomatic HIV infection (CMS-HCC) Gastroesophageal reflux disease without esophagitis Weight loss Cryptococcosis (CMS-HCC) Dental caries Family history of breast cancer Family history of pancreatic cancer Healthcare maintenance Other viral warts Personal history of allergy to penicillin  Past Surgical History: Procedure Laterality Date Port a Cath 03/26/2022 Placement APPENDECTOMY Unknown date BREAST EXCISIONAL BIOPSY Right Unknown date CYSTOSCOPY Unknown date HYSTERECTOMY VAGINAL Unknown date   Allergies Allergen Reactions Penicillamine Rash Penicillins Rash Has patient had a PCN reaction causing immediate rash, facial/tongue/throat swelling, SOB or lightheadedness with hypotension: No Has patient had a PCN reaction causing severe rash involving mucus membranes or skin necrosis: No Has patient had a PCN reaction that required hospitalization  No Has patient had a PCN reaction occurring within the last 10 years: No If all of the above answers are "NO", then may proceed with Cephalosporin use.  Current Outpatient Medications on File Prior to Visit Medication Sig Dispense Refill amLODIPine (NORVASC) 5 MG tablet Take 5 mg by mouth once daily atazanavir-cobicistat (EVOTAZ) 300-150 mg tablet Take 1 tablet by mouth once daily dolutegravir (TIVICAY) 50 mg tablet Take 50 mg by mouth 2 (two) times daily hydroCHLOROthiazide (HYDRODIURIL) 25 MG tablet Take 25 mg by mouth once daily zidovudine (RETROVIR) 300 mg tablet Take 300 mg by mouth 2 (two) times daily chlorhexidine (PERIDEX) 0.12 % solution Swish and spit 15 mLs 2 (two) times daily (Patient not taking: Reported on 08/05/2022) imiquimod (ALDARA) 5 % cream Apply 1 packet topically 2 (two) times daily Apply to wart on finger twice a day until wart is gone +7 days (Patient not taking: Reported on 08/05/2022)  No current facility-administered medications on file prior to visit.  Family History Problem Relation Age of Onset High blood pressure (Hypertension) Mother Hyperlipidemia (Elevated cholesterol) Mother Coronary Artery Disease (Blocked arteries around heart) Mother High blood pressure (Hypertension) Father Hyperlipidemia (Elevated cholesterol) Father Coronary Artery Disease (Blocked arteries around heart) Father Breast cancer Sister Diabetes Sister   Social History  Tobacco Use Smoking Status Every Day Types: Cigarettes Smokeless Tobacco Never   Social History  Socioeconomic History Marital status: Single Tobacco Use Smoking status: Every Day Types: Cigarettes Smokeless tobacco: Never Vaping Use Vaping Use: Unknown Substance and Sexual Activity Alcohol use: Never Drug use: Never Sexual activity: Defer  Objective:  Vitals: 08/05/22 1131 PainSc: 0-No pain  There is no height or weight on file to calculate BMI.  Physical Exam  She appears well on  exam.  There are no palpable left breast masses. The nipple areolar complex  is normal.  I can palpate a very small lymph node in her left axilla which is otherwise unremarkable. There is no arm swelling.  Labs, Imaging and Diagnostic Testing:  I reviewed the latest notes from the cancer center.  Assessment and Plan:  Diagnoses and all orders for this visit:  Breast cancer metastasized to axillary lymph node, left (CMS-HCC)    At this point, the plan will be to go and proceed with breast conserving surgery. This would be with a radioactive seed guided left breast lumpectomy and targeted left axillary lymph node dissection. The cancer center is also notified me that I can remove her Port-A-Cath at the same time. We discussed the surgical procedure in detail. We discussed the risks which includes but is not limited to bleeding, infection, the need for further surgery if margins are positive, seroma formation, chronic arm swelling, cardiopulmonary issues, postoperative recovery, DVT, etc. I will call her with the results of the MRI. Surgery will be scheduled. She agrees with the plans.

## 2022-08-27 ENCOUNTER — Encounter (HOSPITAL_BASED_OUTPATIENT_CLINIC_OR_DEPARTMENT_OTHER): Payer: Self-pay | Admitting: Surgery

## 2022-08-27 ENCOUNTER — Ambulatory Visit
Admission: RE | Admit: 2022-08-27 | Discharge: 2022-08-27 | Disposition: A | Discharge status: Home / Self Care | Payer: Medicare HMO | Source: Ambulatory Visit | Attending: Surgery | Admitting: Surgery

## 2022-08-27 ENCOUNTER — Ambulatory Visit (HOSPITAL_BASED_OUTPATIENT_CLINIC_OR_DEPARTMENT_OTHER)
Admission: RE | Admit: 2022-08-27 | Discharge: 2022-08-28 | Disposition: A | Discharge status: Home / Self Care | Payer: Medicare HMO | Source: Ambulatory Visit | Attending: Surgery | Admitting: Surgery

## 2022-08-27 ENCOUNTER — Ambulatory Visit (HOSPITAL_BASED_OUTPATIENT_CLINIC_OR_DEPARTMENT_OTHER): Payer: Medicare HMO | Admitting: Anesthesiology

## 2022-08-27 ENCOUNTER — Encounter (HOSPITAL_BASED_OUTPATIENT_CLINIC_OR_DEPARTMENT_OTHER)
Admission: RE | Disposition: A | Discharge status: Home / Self Care | Payer: Self-pay | Source: Ambulatory Visit | Attending: Surgery

## 2022-08-27 ENCOUNTER — Other Ambulatory Visit: Payer: Self-pay

## 2022-08-27 DIAGNOSIS — C50912 Malignant neoplasm of unspecified site of left female breast: Secondary | ICD-10-CM

## 2022-08-27 DIAGNOSIS — I10 Essential (primary) hypertension: Secondary | ICD-10-CM | POA: Insufficient documentation

## 2022-08-27 DIAGNOSIS — E785 Hyperlipidemia, unspecified: Secondary | ICD-10-CM | POA: Diagnosis not present

## 2022-08-27 DIAGNOSIS — C773 Secondary and unspecified malignant neoplasm of axilla and upper limb lymph nodes: Secondary | ICD-10-CM | POA: Diagnosis not present

## 2022-08-27 DIAGNOSIS — C50412 Malignant neoplasm of upper-outer quadrant of left female breast: Secondary | ICD-10-CM | POA: Diagnosis present

## 2022-08-27 DIAGNOSIS — D630 Anemia in neoplastic disease: Secondary | ICD-10-CM

## 2022-08-27 DIAGNOSIS — K219 Gastro-esophageal reflux disease without esophagitis: Secondary | ICD-10-CM | POA: Diagnosis not present

## 2022-08-27 DIAGNOSIS — Z21 Asymptomatic human immunodeficiency virus [HIV] infection status: Secondary | ICD-10-CM | POA: Insufficient documentation

## 2022-08-27 DIAGNOSIS — Z87891 Personal history of nicotine dependence: Secondary | ICD-10-CM

## 2022-08-27 DIAGNOSIS — Z01818 Encounter for other preprocedural examination: Secondary | ICD-10-CM

## 2022-08-27 HISTORY — PX: PORT-A-CATH REMOVAL: SHX5289

## 2022-08-27 HISTORY — PX: BREAST LUMPECTOMY WITH RADIOACTIVE SEED LOCALIZATION: SHX6424

## 2022-08-27 HISTORY — PX: RADIOACTIVE SEED GUIDED AXILLARY SENTINEL LYMPH NODE: SHX6735

## 2022-08-27 SURGERY — BREAST LUMPECTOMY WITH RADIOACTIVE SEED LOCALIZATION
Anesthesia: General | Site: Chest | Laterality: Right

## 2022-08-27 MED ORDER — ACETAMINOPHEN 500 MG PO TABS
1000.0000 mg | ORAL_TABLET | Freq: Four times a day (QID) | ORAL | Status: DC
  Administered 2022-08-27: 1000 mg via ORAL

## 2022-08-27 MED ORDER — CIPROFLOXACIN IN D5W 400 MG/200ML IV SOLN
400.0000 mg | INTRAVENOUS | Status: AC
  Administered 2022-08-27: 400 mg via INTRAVENOUS

## 2022-08-27 MED ORDER — PROPOFOL 10 MG/ML IV BOLUS
INTRAVENOUS | Status: AC
  Filled 2022-08-27: qty 20

## 2022-08-27 MED ORDER — PHENYLEPHRINE 80 MCG/ML (10ML) SYRINGE FOR IV PUSH (FOR BLOOD PRESSURE SUPPORT)
PREFILLED_SYRINGE | INTRAVENOUS | Status: AC
  Filled 2022-08-27: qty 10

## 2022-08-27 MED ORDER — ENOXAPARIN SODIUM 40 MG/0.4ML IJ SOSY: 40.0000 mg | PREFILLED_SYRINGE | INTRAMUSCULAR | Status: DC

## 2022-08-27 MED ORDER — CHLORHEXIDINE GLUCONATE CLOTH 2 % EX PADS: 6.0000 | MEDICATED_PAD | Freq: Once | CUTANEOUS | Status: DC

## 2022-08-27 MED ORDER — ONDANSETRON HCL 4 MG/2ML IJ SOLN
INTRAMUSCULAR | Status: DC | PRN
  Administered 2022-08-27: 4 mg via INTRAVENOUS

## 2022-08-27 MED ORDER — FOSTEMSAVIR TROMETHAMINE ER 600 MG PO TB12
600.0000 mg | ORAL_TABLET | Freq: Two times a day (BID) | ORAL | Status: DC
  Administered 2022-08-27: 600 mg via ORAL

## 2022-08-27 MED ORDER — BUPIVACAINE-EPINEPHRINE 0.5% -1:200000 IJ SOLN
INTRAMUSCULAR | Status: DC | PRN
  Administered 2022-08-27: 20 mL

## 2022-08-27 MED ORDER — PROPOFOL 10 MG/ML IV BOLUS
INTRAVENOUS | Status: DC | PRN
  Administered 2022-08-27: 20 mg via INTRAVENOUS
  Administered 2022-08-27: 120 mg via INTRAVENOUS
  Administered 2022-08-27 (×2): 20 mg via INTRAVENOUS

## 2022-08-27 MED ORDER — CIPROFLOXACIN IN D5W 400 MG/200ML IV SOLN
INTRAVENOUS | Status: AC
  Filled 2022-08-27: qty 200

## 2022-08-27 MED ORDER — SODIUM BICARBONATE 4.2 % IV SOLN
INTRAVENOUS | Status: AC
  Filled 2022-08-27: qty 10

## 2022-08-27 MED ORDER — OXYCODONE HCL 5 MG PO TABS: 5.0000 mg | ORAL_TABLET | Freq: Once | ORAL | Status: DC | PRN

## 2022-08-27 MED ORDER — FENTANYL CITRATE (PF) 100 MCG/2ML IJ SOLN: 25.0000 ug | INTRAMUSCULAR | Status: DC | PRN

## 2022-08-27 MED ORDER — ACETAMINOPHEN 160 MG/5ML PO SOLN
ORAL | Status: AC
  Filled 2022-08-27: qty 40.6

## 2022-08-27 MED ORDER — LIDOCAINE 2% (20 MG/ML) 5 ML SYRINGE
INTRAMUSCULAR | Status: AC
  Filled 2022-08-27: qty 5

## 2022-08-27 MED ORDER — TRAMADOL HCL 50 MG PO TABS: 50.0000 mg | ORAL_TABLET | Freq: Four times a day (QID) | ORAL | Status: DC | PRN

## 2022-08-27 MED ORDER — OXYCODONE HCL 5 MG/5ML PO SOLN: 5.0000 mg | Freq: Once | ORAL | Status: DC | PRN

## 2022-08-27 MED ORDER — ONDANSETRON HCL 4 MG/2ML IJ SOLN
INTRAMUSCULAR | Status: AC
  Filled 2022-08-27: qty 2

## 2022-08-27 MED ORDER — DIPHENHYDRAMINE HCL 50 MG/ML IJ SOLN: 12.5000 mg | Freq: Four times a day (QID) | INTRAMUSCULAR | Status: DC | PRN

## 2022-08-27 MED ORDER — BUPIVACAINE LIPOSOME 1.3 % IJ SUSP
INTRAMUSCULAR | Status: DC | PRN
  Administered 2022-08-27: 10 mL via PERINEURAL

## 2022-08-27 MED ORDER — LIDOCAINE HCL (CARDIAC) PF 100 MG/5ML IV SOSY
PREFILLED_SYRINGE | INTRAVENOUS | Status: DC | PRN
  Administered 2022-08-27: 40 mg via INTRATRACHEAL

## 2022-08-27 MED ORDER — PROPOFOL 500 MG/50ML IV EMUL
INTRAVENOUS | Status: DC | PRN
  Administered 2022-08-27: 150 ug/kg/min via INTRAVENOUS

## 2022-08-27 MED ORDER — HYDROMORPHONE HCL 1 MG/ML IJ SOLN: 0.5000 mg | INTRAMUSCULAR | Status: DC | PRN

## 2022-08-27 MED ORDER — BUPIVACAINE HCL (PF) 0.5 % IJ SOLN
INTRAMUSCULAR | Status: DC | PRN
  Administered 2022-08-27: 20 mL via PERINEURAL

## 2022-08-27 MED ORDER — SODIUM CHLORIDE 0.9 % IV SOLN: INTRAVENOUS | Status: DC

## 2022-08-27 MED ORDER — ONDANSETRON HCL 4 MG/2ML IJ SOLN: 4.0000 mg | Freq: Once | INTRAMUSCULAR | Status: DC | PRN

## 2022-08-27 MED ORDER — ACETAMINOPHEN 500 MG PO TABS: 1000.0000 mg | ORAL_TABLET | ORAL | Status: DC

## 2022-08-27 MED ORDER — ONDANSETRON 4 MG PO TBDP: 4.0000 mg | ORAL_TABLET | Freq: Four times a day (QID) | ORAL | Status: DC | PRN

## 2022-08-27 MED ORDER — MIDAZOLAM HCL 2 MG/2ML IJ SOLN
2.0000 mg | Freq: Once | INTRAMUSCULAR | Status: AC
  Administered 2022-08-27: 2 mg via INTRAVENOUS

## 2022-08-27 MED ORDER — DIPHENHYDRAMINE HCL 12.5 MG/5ML PO ELIX: 12.5000 mg | ORAL_SOLUTION | Freq: Four times a day (QID) | ORAL | Status: DC | PRN

## 2022-08-27 MED ORDER — DEXAMETHASONE SODIUM PHOSPHATE 10 MG/ML IJ SOLN
INTRAMUSCULAR | Status: DC | PRN
  Administered 2022-08-27: 10 mg via INTRAVENOUS

## 2022-08-27 MED ORDER — FENTANYL CITRATE (PF) 100 MCG/2ML IJ SOLN
INTRAMUSCULAR | Status: AC
  Filled 2022-08-27: qty 2

## 2022-08-27 MED ORDER — OXYCODONE HCL 5 MG PO TABS: 5.0000 mg | ORAL_TABLET | ORAL | Status: DC | PRN

## 2022-08-27 MED ORDER — HYDROCHLOROTHIAZIDE 25 MG PO TABS: 25.0000 mg | ORAL_TABLET | Freq: Every day | ORAL | Status: DC

## 2022-08-27 MED ORDER — EMTRICITABINE-TENOFOVIR AF 200-25 MG PO TABS: 1.0000 | ORAL_TABLET | Freq: Every day | ORAL | Status: DC

## 2022-08-27 MED ORDER — MIDAZOLAM HCL 2 MG/2ML IJ SOLN
INTRAMUSCULAR | Status: AC
  Filled 2022-08-27: qty 2

## 2022-08-27 MED ORDER — PHENYLEPHRINE HCL (PRESSORS) 10 MG/ML IV SOLN
INTRAVENOUS | Status: DC | PRN
  Administered 2022-08-27: 160 ug via INTRAVENOUS
  Administered 2022-08-27: 80 ug via INTRAVENOUS

## 2022-08-27 MED ORDER — ACETAMINOPHEN 500 MG PO TABS
ORAL_TABLET | ORAL | Status: AC
  Filled 2022-08-27: qty 2

## 2022-08-27 MED ORDER — FENTANYL CITRATE (PF) 100 MCG/2ML IJ SOLN
INTRAMUSCULAR | Status: DC | PRN
  Administered 2022-08-27: 50 ug via INTRAVENOUS
  Administered 2022-08-27 (×2): 25 ug via INTRAVENOUS

## 2022-08-27 MED ORDER — LACTATED RINGERS IV SOLN: INTRAVENOUS | Status: DC

## 2022-08-27 MED ORDER — FENTANYL CITRATE (PF) 100 MCG/2ML IJ SOLN
50.0000 ug | Freq: Once | INTRAMUSCULAR | Status: AC
  Administered 2022-08-27: 50 ug via INTRAVENOUS

## 2022-08-27 MED ORDER — DEXAMETHASONE SODIUM PHOSPHATE 10 MG/ML IJ SOLN
INTRAMUSCULAR | Status: AC
  Filled 2022-08-27: qty 1

## 2022-08-27 MED ORDER — ONDANSETRON HCL 4 MG/2ML IJ SOLN: 4.0000 mg | Freq: Four times a day (QID) | INTRAMUSCULAR | Status: DC | PRN

## 2022-08-27 SURGICAL SUPPLY — 49 items
ADH SKN CLS APL DERMABOND .7 (GAUZE/BANDAGES/DRESSINGS) ×2
APL PRP STRL LF DISP 70% ISPRP (MISCELLANEOUS) ×2
APPLIER CLIP 9.375 MED OPEN (MISCELLANEOUS)
APR CLP MED 9.3 20 MLT OPN (MISCELLANEOUS)
BINDER BREAST 3XL (GAUZE/BANDAGES/DRESSINGS) IMPLANT
BINDER BREAST LRG (GAUZE/BANDAGES/DRESSINGS) IMPLANT
BINDER BREAST MEDIUM (GAUZE/BANDAGES/DRESSINGS) IMPLANT
BINDER BREAST XLRG (GAUZE/BANDAGES/DRESSINGS) IMPLANT
BINDER BREAST XXLRG (GAUZE/BANDAGES/DRESSINGS) IMPLANT
BLADE SURG 15 STRL LF DISP TIS (BLADE) ×3 IMPLANT
BLADE SURG 15 STRL SS (BLADE) ×2
CANISTER SUC SOCK COL 7IN (MISCELLANEOUS) IMPLANT
CANISTER SUCT 1200ML W/VALVE (MISCELLANEOUS) IMPLANT
CHLORAPREP W/TINT 26 (MISCELLANEOUS) ×3 IMPLANT
CLIP APPLIE 9.375 MED OPEN (MISCELLANEOUS) IMPLANT
COVER BACK TABLE 60X90IN (DRAPES) ×3 IMPLANT
COVER MAYO STAND STRL (DRAPES) ×3 IMPLANT
COVER PROBE CYLINDRICAL 5X96 (MISCELLANEOUS) ×3 IMPLANT
DERMABOND ADVANCED .7 DNX12 (GAUZE/BANDAGES/DRESSINGS) ×3 IMPLANT
DRAPE LAPAROSCOPIC ABDOMINAL (DRAPES) ×3 IMPLANT
DRAPE LAPAROTOMY 100X72 PEDS (DRAPES) ×3 IMPLANT
DRAPE UTILITY XL STRL (DRAPES) ×3 IMPLANT
ELECT REM PT RETURN 9FT ADLT (ELECTROSURGICAL) ×2
ELECTRODE REM PT RTRN 9FT ADLT (ELECTROSURGICAL) ×3 IMPLANT
GAUZE SPONGE 4X4 12PLY STRL LF (GAUZE/BANDAGES/DRESSINGS) IMPLANT
GLOVE SURG SIGNA 7.5 PF LTX (GLOVE) ×3 IMPLANT
GOWN STRL REUS W/ TWL LRG LVL3 (GOWN DISPOSABLE) ×3 IMPLANT
GOWN STRL REUS W/ TWL XL LVL3 (GOWN DISPOSABLE) ×3 IMPLANT
GOWN STRL REUS W/TWL LRG LVL3 (GOWN DISPOSABLE) ×2
GOWN STRL REUS W/TWL XL LVL3 (GOWN DISPOSABLE) ×2
KIT MARKER MARGIN INK (KITS) ×3 IMPLANT
NDL HYPO 25X1 1.5 SAFETY (NEEDLE) ×3 IMPLANT
NEEDLE HYPO 25X1 1.5 SAFETY (NEEDLE) ×2 IMPLANT
NS IRRIG 1000ML POUR BTL (IV SOLUTION) IMPLANT
PACK BASIN DAY SURGERY FS (CUSTOM PROCEDURE TRAY) ×3 IMPLANT
PENCIL SMOKE EVACUATOR (MISCELLANEOUS) ×3 IMPLANT
SLEEVE SCD COMPRESS KNEE MED (STOCKING) ×3 IMPLANT
SPIKE FLUID TRANSFER (MISCELLANEOUS) IMPLANT
SPONGE T-LAP 4X18 ~~LOC~~+RFID (SPONGE) ×3 IMPLANT
SUT MNCRL AB 4-0 PS2 18 (SUTURE) ×3 IMPLANT
SUT SILK 2 0 SH (SUTURE) IMPLANT
SUT VIC AB 3-0 SH 27 (SUTURE) ×6
SUT VIC AB 3-0 SH 27X BRD (SUTURE) ×3 IMPLANT
SYR BULB EAR ULCER 3OZ GRN STR (SYRINGE) IMPLANT
SYR CONTROL 10ML LL (SYRINGE) ×3 IMPLANT
TOWEL GREEN STERILE FF (TOWEL DISPOSABLE) ×3 IMPLANT
TRAY FAXITRON CT DISP (TRAY / TRAY PROCEDURE) ×3 IMPLANT
TUBE CONNECTING 20X1/4 (TUBING) IMPLANT
YANKAUER SUCT BULB TIP NO VENT (SUCTIONS) IMPLANT

## 2022-08-27 NOTE — Interval H&P Note (Signed)
History and Physical Interval Note: no change in H and P  08/27/2022 8:35 AM  Veronica Robles  has presented today for surgery, with the diagnosis of LEFT NODE POSITIVE BREAST CANCER.  The various methods of treatment have been discussed with the patient and family. After consideration of risks, benefits and other options for treatment, the patient has consented to  Procedure(s): LEFT BREAST LUMPECTOMY WITH RADIOACTIVE SEED LOCALIZATION (Left) RADIOACTIVE SEED GUIDED LEFT AXILLARY SENTINEL LYMPH NODE DISSECTION (Left) REMOVAL PORT-A-CATH (N/A) as a surgical intervention.  The patient's history has been reviewed, patient examined, no change in status, stable for surgery.  I have reviewed the patient's chart and labs.  Questions were answered to the patient's satisfaction.     [name]

## 2022-08-27 NOTE — Anesthesia Preprocedure Evaluation (Addendum)
Anesthesia Evaluation  Patient identified by MRN, date of birth, ID band Patient awake    Reviewed: Allergy & Precautions, NPO status , Patient's Chart, lab work & pertinent test results  Airway Mallampati: I  TM Distance: >3 FB     Dental  (+) Missing, Poor Dentition, Dental Advisory Given   Pulmonary former smoker   Pulmonary exam normal breath sounds clear to auscultation       Cardiovascular hypertension, Pt. on medications Normal cardiovascular exam Rhythm:Regular Rate:Normal     Neuro/Psych negative neurological ROS  negative psych ROS   GI/Hepatic Neg liver ROS,GERD  Medicated,,  Endo/Other  Left Breast Ca Hyperlipidemia  Renal/GU negative Renal ROS  negative genitourinary   Musculoskeletal   Abdominal   Peds  Hematology  (+) Blood dyscrasia, anemia , HIVLow CD4 counts   Anesthesia Other Findings   Reproductive/Obstetrics                             Anesthesia Physical Anesthesia Plan  ASA: 3  Anesthesia Plan: General   Post-op Pain Management: Regional block* and Minimal or no pain anticipated   Induction: Intravenous  PONV Risk Score and Plan: 4 or greater and Treatment may vary due to age or medical condition, Ondansetron and Dexamethasone  Airway Management Planned: LMA  Additional Equipment: None  Intra-op Plan:   Post-operative Plan: Extubation in OR  Informed Consent: I have reviewed the patients History and Physical, chart, labs and discussed the procedure including the risks, benefits and alternatives for the proposed anesthesia with the patient or authorized representative who has indicated his/her understanding and acceptance.     Dental advisory given  Plan Discussed with: Anesthesiologist and CRNA  Anesthesia Plan Comments:         Anesthesia Quick Evaluation

## 2022-08-27 NOTE — Anesthesia Procedure Notes (Signed)
Anesthesia Regional Block: Pectoralis block   Pre-Anesthetic Checklist: , timeout performed,  Correct Patient, Correct Site, Correct Laterality,  Correct Procedure, Correct Position, site marked,  Risks and benefits discussed,  Surgical consent,  Pre-op evaluation,  At surgeon's request and post-op pain management  Laterality: Left  Prep: chloraprep       Needles:  Injection technique: Single-shot  Needle Type: Echogenic Stimulator Needle     Needle Length: 10cm  Needle Gauge: 21   Needle insertion depth: 6 cm   Additional Needles:   Narrative:  Start time: 08/27/2022 8:10 AM End time: 08/27/2022 8:15 AM Injection made incrementally with aspirations every 5 mL.  Performed by: Personally  Anesthesiologist: Josephine Igo, MD  Additional Notes: Timeout performed. Patient sedated. Relevant anatomy ID'd using [place]. Incremental 2-22m injection of LA with frequent aspiration. Patient tolerated procedure well.

## 2022-08-27 NOTE — Progress Notes (Signed)
Assisted Dr. Foster with left, pectoralis, ultrasound guided block. Side rails up, monitors on throughout procedure. See vital signs in flow sheet. Tolerated Procedure well. 

## 2022-08-27 NOTE — Anesthesia Procedure Notes (Signed)
Procedure Name: LMA Insertion Date/Time: 08/27/2022 9:05 AM  Performed by: Glory Buff, CRNAPre-anesthesia Checklist: Patient identified, Emergency Drugs available, Suction available and Patient being monitored Patient Re-evaluated:Patient Re-evaluated prior to induction Oxygen Delivery Method: Circle system utilized Preoxygenation: Pre-oxygenation with 100% oxygen Induction Type: IV induction LMA: LMA inserted LMA Size: 3.0 Number of attempts: 1 Placement Confirmation: positive ETCO2 Tube secured with: Tape Dental Injury: Teeth and Oropharynx as per pre-operative assessment

## 2022-08-27 NOTE — Transfer of Care (Signed)
Immediate Anesthesia Transfer of Care Note  Patient: Veronica Robles  Procedure(s) Performed: LEFT BREAST LUMPECTOMY WITH RADIOACTIVE SEED LOCALIZATION (Left: Breast) RADIOACTIVE SEED GUIDED LEFT AXILLARY SENTINEL LYMPH NODE DISSECTION (Left: Breast) REMOVAL PORT-A-CATH (Right: Chest)  Patient Location: PACU  Anesthesia Type:GA combined with regional for post-op pain  Level of Consciousness: drowsy, patient cooperative, and responds to stimulation  Airway & Oxygen Therapy: Patient Spontanous Breathing and Patient connected to face mask oxygen  Post-op Assessment: Report given to RN and Post -op Vital signs reviewed and stable  Post vital signs: Reviewed and stable  Last Vitals:  Vitals Value Taken Time  BP    Temp    Pulse    Resp    SpO2      Last Pain:  Vitals:   08/27/22 0756  TempSrc: Oral  PainSc: 0-No pain      Patients Stated Pain Goal: 3 (11/00/34 9611)  Complications: No notable events documented.

## 2022-08-27 NOTE — Anesthesia Postprocedure Evaluation (Signed)
Anesthesia Post Note  Patient: Veronica Robles  Procedure(s) Performed: LEFT BREAST LUMPECTOMY WITH RADIOACTIVE SEED LOCALIZATION (Left: Breast) RADIOACTIVE SEED GUIDED LEFT AXILLARY SENTINEL LYMPH NODE DISSECTION (Left: Breast) REMOVAL PORT-A-CATH (Right: Chest)     Patient location during evaluation: PACU Anesthesia Type: General Level of consciousness: awake and alert and oriented Pain management: pain level controlled Vital Signs Assessment: post-procedure vital signs reviewed and stable Respiratory status: spontaneous breathing, nonlabored ventilation and respiratory function stable Cardiovascular status: blood pressure returned to baseline and stable Postop Assessment: no apparent nausea or vomiting Anesthetic complications: no   No notable events documented.  Last Vitals:  Vitals:   08/27/22 1030 08/27/22 1045  BP: 118/64 119/68  Pulse: 81 77  Resp: 15 15  Temp:    SpO2: 100% 99%    Last Pain:  Vitals:   08/27/22 1016  TempSrc:   PainSc: 0-No pain                 Zyion Leidner A.

## 2022-08-27 NOTE — Op Note (Signed)
Veronica Robles 08/27/2022   Pre-op Diagnosis: LEFT NODE POSITIVE BREAST CANCER     Post-op Diagnosis: same  Procedure(s): LEFT BREAST LUMPECTOMY WITH RADIOACTIVE SEED LOCALIZATION RADIOACTIVE SEED GUIDED LEFT AXILLARY TARGETED LYMPH NODE DISSECTION INJECTION OF MAG TRACE FOR LYMPH NODE MAPPING REMOVAL PORT-A-CATH RIGHT IJ VEIN  Surgeon(s): [name], MD  Anesthesia: General  Staff:  Circulator: Merwyn Katos, RN Scrub Person: Buddy Duty A  Estimated Blood Loss: Minimal               Specimens: SENT TO PATH  Indications: This is a 67 year old female who had undergone neoadjuvant chemotherapy for node positive left breast cancer.  After completing chemotherapy follow-up MRI showed progression of the lymph nodes as well as a decrease in the size of the left breast cancer.  The decision was made to proceed with a radioactive seed guided left breast lumpectomy with targeted lymph node dissection as well as Port-A-Cath removal as the port will no longer be needed  Procedure: The patient was brought to the operating room and identified as the correct patient.  She was placed upon on the operating table and general anesthesia was induced.  Her breast and neck were then prepped and draped in usual sterile fashion.  Using the neoprobe identified the radioactive seed at the location of the breast cancer at the 3 o'clock position of the left breast.  I anesthetized the skin over this area with Marcaine and then made an elliptical incision with a scalpel.  I then dissected down to the breast tissue circumferentially with electrocautery.  The radioactive seed was located in the deep breast tissue near the chest wall.  I stayed widely around this area with a the neoprobe dissecting in all directions going down to the chest wall.  I then completed lumpectomy taking the specimen off of the chest wall.  All margins were then marked with paint.  An x-ray was performed confirming that the  radioactive seed and previous biopsy clip were in the specimen.  The lumpectomy specimen was then sent to pathology for evaluation.  Hemostasis was achieved at the lumpectomy site with the cautery I next identified an area of increased uptake from the radioactive seed is been placed in the previously biopsied lymph node in the left axilla.  I anesthetized skin with Marcaine and then made incision with a scalpel.  I then dissected down into the deep left axillary tissue and identified the targeted lymph node.  Again this was done with the neoprobe.  I elevated the lymph node and several lymph node surrounding the area and excised these with the electrocautery.  There was uptake of MAC trace in the specimen as well.  An x-ray on the specimen confirmed that the radioactive seed and targeted lymph node were in the specimen.  Identified further lymph nodes in the axilla with increased uptake of the MAC trace and these were excised as well.  There was no other increased uptake in the axilla and no palpable lymph nodes remaining.  I Surgical clips at the lumpectomy cavity for marking purposes.  I then closed both these incisions with interrupted 3-0 Vicryl sutures and running 4-0 Monocryl sutures.  I next anesthetized the skin at the Port-A-Cath on the right chest with Marcaine.  I made incision through the previous car with a scalpel and dissected down to the port which was easily identified.  The sutures to the port were removed and the port and the entire length of the catheter  were removed easily and intact.  I closed the catheter tract with a figure-of-eight 3-0 Vicryl suture.  I then closed the subcutaneous tissue with interrupted 3-0 Vicryl sutures and closed the skin with a running 4-0 Monocryl.  Dermabond was applied to all incisions.  The patient tolerated the procedure well.  All the counts were correct at the end of the procedure.  The patient was placed in her breast binder and then extubated in the  operating room and taken in a stable condition to the recovery room.          [name]   Date: 08/27/2022  Time: 10:10 AM

## 2022-08-28 ENCOUNTER — Encounter (HOSPITAL_BASED_OUTPATIENT_CLINIC_OR_DEPARTMENT_OTHER): Payer: Self-pay | Admitting: Surgery

## 2022-08-28 NOTE — Discharge Instructions (Signed)
[facility] Office Phone Number [phone]  BREAST BIOPSY/ PARTIAL MASTECTOMY: POST OP INSTRUCTIONS  Always review your discharge instruction sheet given to you by the facility where your surgery was performed.  IF YOU HAVE DISABILITY OR FAMILY LEAVE FORMS, YOU MUST BRING THEM TO THE OFFICE FOR PROCESSING.  DO NOT GIVE THEM TO YOUR DOCTOR.  A prescription for pain medication may be given to you upon discharge.  Take your pain medication as prescribed, if needed.  If narcotic pain medicine is not needed, then you may take acetaminophen (Tylenol) or ibuprofen (Advil) as needed. Take your usually prescribed medications unless otherwise directed If you need a refill on your pain medication, please contact your pharmacy.  They will contact our office to request authorization.  Prescriptions will not be filled after 5pm or on week-ends. You should eat very light the first 24 hours after surgery, such as soup, crackers, pudding, etc.  Resume your normal diet the day after surgery. Most patients will experience some swelling and bruising in the breast.  Ice packs and a good support bra will help.  Swelling and bruising can take several days to resolve.  It is common to experience some constipation if taking pain medication after surgery.  Increasing fluid intake and taking a stool softener will usually help or prevent this problem from occurring.  A mild laxative (Milk of Magnesia or Miralax) should be taken according to package directions if there are no bowel movements after 48 hours. Unless discharge instructions indicate otherwise, you may remove your bandages 24-48 hours after surgery, and you may shower at that time.  You may have steri-strips (small skin tapes) in place directly over the incision.  These strips should be left on the skin for 7-10 days.  If your surgeon used skin glue on the incision, you may shower in 24 hours.  The glue will flake off over the next 2-3 weeks.  Any  sutures or staples will be removed at the office during your follow-up visit. ACTIVITIES:  You may resume regular daily activities (gradually increasing) beginning the next day.  Wearing a good support bra or sports bra minimizes pain and swelling.  You may have sexual intercourse when it is comfortable. You may drive when you no longer are taking prescription pain medication, you can comfortably wear a seatbelt, and you can safely maneuver your car and apply brakes. RETURN TO WORK:  ______________________________________________________________________________________ Dennis Bast should see your doctor in the office for a follow-up appointment approximately two weeks after your surgery.  Your doctor's nurse will typically make your follow-up appointment when she calls you with your pathology report.  Expect your pathology report 2-3 business days after your surgery.  You may call to check if you do not hear from [place] after three days. OTHER INSTRUCTIONS: YOU MAY SHOWER STARTING TODAY _______________________________________________________________________________________________ _____________________________________________________________________________________________________________________________________ _____________________________________________________________________________________________________________________________________ _____________________________________________________________________________________________________________________________________  WHEN TO CALL YOUR DOCTOR: Fever over 101.0 Nausea and/or vomiting. Extreme swelling or bruising. Continued bleeding from incision. Increased pain, redness, or drainage from the incision.  The clinic staff is available to answer your questions during regular business hours.  Please don't hesitate to call and ask to speak to one of the nurses for clinical concerns.  If you have a medical emergency, go to the nearest emergency room or call  [phone].  A surgeon from [facility] is always on call at the hospital.  For further questions, please visit [email or web address]

## 2022-08-28 NOTE — Discharge Summary (Signed)
Physician Discharge Summary  Patient ID: Veronica Robles MRN: [id number] DOB/AGE: 67-Apr-1957 67 y.o.  Admit date: 08/27/2022 Discharge date: 08/28/2022  Admission Diagnoses:  Discharge Diagnoses:  LEFT BREAST CANCER S/P LEFT BREAST LUMPECTOMY WITH TARGETED NODE DISSECTION  Discharged Condition: good  Hospital Course: uneventful post op recovery.  Discharged home POD#1  Consults: None  Significant Diagnostic Studies:   Treatments: surgery: left breast radioactive seed guided lumpectomy with targeted lymph node dissection, port-a-cath removal  Discharge Exam: Blood pressure 139/65, pulse 67, temperature 98.3 F (36.8 C), resp. rate 16, height '5\' 2"'$  (1.575 m), weight 55.4 kg, SpO2 100 %. General appearance: alert, cooperative, and no distress Resp: clear to auscultation bilaterally Cardio: regular rate and rhythm, S1, S2 normal, no murmur, click, rub or gallop Incision/Wound:incisions clean without hematoma  Disposition: Discharge disposition: 01-Home or Self Care       Discharge Instructions     Diet - low sodium heart healthy   Complete by: As directed    Increase activity slowly   Complete by: As directed       Allergies as of 08/28/2022       Reactions   Penicillins Rash   Has patient had a PCN reaction causing immediate rash, facial/tongue/throat swelling, SOB or lightheadedness with hypotension: No Has patient had a PCN reaction causing severe rash involving mucus membranes or skin necrosis: No Has patient had a PCN reaction that required hospitalization No Has patient had a PCN reaction occurring within the last 10 years: No If all of the above answers are "NO", then may proceed with Cephalosporin use.        Medication List     TAKE these medications    Adult Blood Pressure Cuff Lg Kit   Descovy 200-25 MG tablet Generic drug: emtricitabine-tenofovir AF Take 1 tablet by mouth daily.   hydrochlorothiazide 25 MG tablet Commonly known as:  HYDRODIURIL Take 1 tablet (25 mg total) by mouth daily.   lidocaine-prilocaine cream Commonly known as: EMLA Apply to affected area once   ondansetron 8 MG tablet Commonly known as: Zofran Take 1 tablet (8 mg) by mouth every 8 hours as needed for nausea/vomiting. Start third day after doxorubicin/cyclophosphamide chemotherapy.   prochlorperazine 10 MG tablet Commonly known as: COMPAZINE Take 1 tablet (10 mg total) by mouth every 6 (six) hours as needed for nausea or vomiting.   Rukobia 600 MG Tb12 ER tablet Generic drug: fostemsavir tromethamine Take 1 tablet (600 mg total) by mouth 2 (two) times daily.   SQ injection Sunlenca 463.5 MG/1.5ML SQ injection Generic drug: lenacapavir Inject 3 mLs (927 mg total) into the skin every 6 (six) months. Administer each injection subcutaneously at separate sites in the abdomen (more or equal to 2 inches from the navel).   sulfamethoxazole-trimethoprim 200-40 MG/5ML suspension Commonly known as: BACTRIM Take 20 mLs by mouth daily.        Follow-up Information     [name], MD. Schedule an appointment as soon as possible for a visit in 3 week(s).   Specialty: General Surgery Contact information: [address] [place] [address] [phone]                 Signed: [name] 08/28/2022, 7:37 AM

## 2022-09-03 LAB — SURGICAL PATHOLOGY

## 2022-09-05 ENCOUNTER — Encounter: Payer: Self-pay | Admitting: *Deleted

## 2022-09-09 NOTE — Progress Notes (Signed)
Patient Care Team: [name], MD as PCP - General (Family Medicine) Carlyle Basques, MD as PCP - Infectious Diseases (Infectious Diseases) Rockwell Germany, RN as Oncology Nurse Navigator Mauro Kaufmann, RN as Oncology Nurse Navigator Nicholas Lose, MD as Consulting Physician (Hematology and Oncology)  DIAGNOSIS: No diagnosis found.  SUMMARY OF ONCOLOGIC HISTORY: Oncology History  Malignant neoplasm of upper-outer quadrant of left breast in female, estrogen receptor positive ([place])  02/28/2022 Initial Diagnosis   Screening detected left breast mass at 3 o'clock position measuring 0.9 cm, 4 enlarged left axillary lymph nodes along with asymmetric ducts between mass and the nipple possibly DCIS; lymph node biopsy: Positive, breast biopsy: Grade 2 IDC ER 100%, PR 0%, HER2 negative, Ki-67 10%   03/10/2022 Cancer Staging   Staging form: Breast, AJCC 8th Edition - Clinical stage from 03/10/2022: Stage IIA (cT1b, cN1(f), cM0, G2, ER+, PR-, HER2-) - Signed by Hayden Pedro, PA-C on 03/10/2022 Stage prefix: Initial diagnosis Method of lymph node assessment: Core biopsy Histologic grading system: 3 grade system   03/27/2022 - 08/08/2022 Chemotherapy   Patient is on Treatment Plan : BREAST ADJUVANT DOSE DENSE AC q14d / PACLitaxel q7d        CHIEF COMPLIANT:   INTERVAL HISTORY: Veronica Robles is a   ALLERGIES:  is allergic to penicillins.  MEDICATIONS:  Current Outpatient Medications  Medication Sig Dispense Refill   Blood Pressure Monitoring (ADULT BLOOD PRESSURE CUFF LG) KIT       emtricitabine-tenofovir AF (DESCOVY) 200-25 MG tablet Take 1 tablet by mouth daily. 30 tablet 5   fostemsavir tromethamine (RUKOBIA) 600 MG TB12 ER tablet Take 1 tablet (600 mg total) by mouth 2 (two) times daily. 60 tablet 5   hydrochlorothiazide (HYDRODIURIL) 25 MG tablet Take 1 tablet (25 mg total) by mouth daily. 90 tablet 1   lidocaine-prilocaine (EMLA) cream Apply to affected area once 30  g 3   ondansetron (ZOFRAN) 8 MG tablet Take 1 tablet (8 mg) by mouth every 8 hours as needed for nausea/vomiting. Start third day after doxorubicin/cyclophosphamide chemotherapy. 30 tablet 1   prochlorperazine (COMPAZINE) 10 MG tablet Take 1 tablet (10 mg total) by mouth every 6 (six) hours as needed for nausea or vomiting. (Patient not taking: Reported on 08/12/2022) 30 tablet 1   SQ injection lenacapavir (SUNLENCA) 463.5 MG/1.5ML SQ injection Inject 3 mLs (927 mg total) into the skin every 6 (six) months. Administer each injection subcutaneously at separate sites in the abdomen (more or equal to 2 inches from the navel). 3 mL 2   sulfamethoxazole-trimethoprim (BACTRIM) 200-40 MG/5ML suspension Take 20 mLs by mouth daily. 473 mL 5   No current facility-administered medications for this visit.   Facility-Administered Medications Ordered in Other Visits  Medication Dose Route Frequency Provider Last Rate Last Admin   ciprofloxacin (CIPRO) IVPB 400 mg  400 mg Intravenous Q12H Kathie Rhodes, MD   400 mg at 03/26/22 [id number]    PHYSICAL EXAMINATION: ECOG PERFORMANCE STATUS: {CHL ONC ECOG [phone]}  There were no vitals filed for this visit. There were no vitals filed for this visit.  BREAST:*** No palpable masses or nodules in either right or left breasts. No palpable axillary supraclavicular or infraclavicular adenopathy no breast tenderness or nipple discharge. (exam performed in the presence of a chaperone)  LABORATORY DATA:  I have reviewed the data as listed    Latest Ref Rng & Units 08/08/2022    8:45 AM 08/01/2022    9:48 AM 07/25/2022  9:28 AM  CMP  Glucose 70 - 99 mg/dL 99  107  122   BUN 8 - 23 mg/dL 10  9  8   $ Creatinine 0.44 - 1.00 mg/dL 0.74  0.78  0.84   Sodium 135 - 145 mmol/L 140  142  140   Potassium 3.5 - 5.1 mmol/L 3.9  3.9  3.7   Chloride 98 - 111 mmol/L 108  107  106   CO2 22 - 32 mmol/L 29  30  30   $ Calcium 8.9 - 10.3 mg/dL 9.1  9.4  9.6   Total Protein 6.5 - 8.1  g/dL 5.9  5.6  6.4   Total Bilirubin 0.3 - 1.2 mg/dL 0.3  0.3  0.3   Alkaline Phos 38 - 126 U/L 58  61  58   AST 15 - 41 U/L 21  23  23   $ ALT 0 - 44 U/L 25  27  23     $ Lab Results  Component Value Date   WBC 2.9 (L) 08/08/2022   HGB 10.1 (L) 08/08/2022   HCT 30.2 (L) 08/08/2022   MCV 94.4 08/08/2022   PLT 156 08/08/2022   NEUTROABS 1.6 (L) 08/08/2022    ASSESSMENT & PLAN:  No problem-specific Assessment & Plan notes found for this encounter.    No orders of the defined types were placed in this encounter.  The patient has a good understanding of the overall plan. she agrees with it. she will call with any problems that may develop before the next visit here. Total time spent: 30 mins including face to face time and time spent for planning, charting and co-ordination of care   Suzzette Righter, [place] 09/09/22    sacroiliac

## 2022-09-09 NOTE — Assessment & Plan Note (Signed)
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  Neoadjuvant chemotherapy with dose dense Adriamycin and Cytoxan x4 followed by Taxol weekly x12 completed 08/08/2022 2. breast conserving surgery with targeted node dissection 08/27/2022: Left lumpectomy: Grade 2 IDC 1.9 cm, margins negative, 3/4 lymph nodes positive ER 100%, PR 0%, HER2 negative, Ki-67 10% 3.  Adjuvant radiation 4.  Follow-up antiestrogen therapy with abemaciclib --------------------------------------------------------------------------------------------------------------------------------- Pathology counseling: I discussed the final pathology report of the patient provided  a copy of this report. I discussed the margins as well as lymph node surgeries. We also discussed the final staging along with previously performed ER/PR and HER-2/neu testing.  Return to clinic after radiation is complete.

## 2022-09-10 ENCOUNTER — Inpatient Hospital Stay: Payer: Medicare HMO | Attending: Hematology and Oncology | Admitting: Hematology and Oncology

## 2022-09-10 ENCOUNTER — Other Ambulatory Visit: Payer: Self-pay

## 2022-09-10 VITALS — BP 128/68 | HR 78 | Temp 97.3°F | Resp 14 | Ht 62.0 in | Wt 123.6 lb

## 2022-09-10 DIAGNOSIS — M25519 Pain in unspecified shoulder: Secondary | ICD-10-CM | POA: Diagnosis not present

## 2022-09-10 DIAGNOSIS — Z880 Allergy status to penicillin: Secondary | ICD-10-CM | POA: Diagnosis not present

## 2022-09-10 DIAGNOSIS — M79603 Pain in arm, unspecified: Secondary | ICD-10-CM | POA: Diagnosis not present

## 2022-09-10 DIAGNOSIS — C50412 Malignant neoplasm of upper-outer quadrant of left female breast: Secondary | ICD-10-CM | POA: Insufficient documentation

## 2022-09-10 DIAGNOSIS — Z170 Estrogen receptor positive status [ER+]: Secondary | ICD-10-CM | POA: Diagnosis not present

## 2022-09-10 DIAGNOSIS — Z79899 Other long term (current) drug therapy: Secondary | ICD-10-CM | POA: Insufficient documentation

## 2022-09-10 DIAGNOSIS — C773 Secondary and unspecified malignant neoplasm of axilla and upper limb lymph nodes: Secondary | ICD-10-CM | POA: Insufficient documentation

## 2022-09-15 ENCOUNTER — Other Ambulatory Visit (HOSPITAL_COMMUNITY): Payer: Self-pay

## 2022-09-16 ENCOUNTER — Encounter (HOSPITAL_COMMUNITY): Payer: Self-pay

## 2022-09-17 ENCOUNTER — Ambulatory Visit (INDEPENDENT_AMBULATORY_CARE_PROVIDER_SITE_OTHER): Payer: Medicare HMO | Admitting: Pharmacist

## 2022-09-17 ENCOUNTER — Ambulatory Visit: Payer: Medicare HMO | Attending: Surgery | Admitting: Rehabilitation

## 2022-09-17 ENCOUNTER — Encounter: Payer: Self-pay | Admitting: Rehabilitation

## 2022-09-17 ENCOUNTER — Other Ambulatory Visit: Payer: Self-pay

## 2022-09-17 DIAGNOSIS — M25612 Stiffness of left shoulder, not elsewhere classified: Secondary | ICD-10-CM | POA: Insufficient documentation

## 2022-09-17 DIAGNOSIS — B20 Human immunodeficiency virus [HIV] disease: Secondary | ICD-10-CM

## 2022-09-17 DIAGNOSIS — Z483 Aftercare following surgery for neoplasm: Secondary | ICD-10-CM | POA: Insufficient documentation

## 2022-09-17 DIAGNOSIS — C50412 Malignant neoplasm of upper-outer quadrant of left female breast: Secondary | ICD-10-CM | POA: Insufficient documentation

## 2022-09-17 DIAGNOSIS — R293 Abnormal posture: Secondary | ICD-10-CM | POA: Diagnosis present

## 2022-09-17 DIAGNOSIS — Z170 Estrogen receptor positive status [ER+]: Secondary | ICD-10-CM | POA: Insufficient documentation

## 2022-09-17 NOTE — Therapy (Signed)
OUTPATIENT PHYSICAL THERAPY BREAST CANCER POST OP FOLLOW UP   Patient Name: Veronica Robles MRN: [id number] DOB:[birth year], 67 y.o., female Today's Date: 09/17/2022  END OF SESSION:  PT End of Session - 09/17/22 1550     Visit Number 2    Number of Visits 8    Authorization Type None needed    PT Start Time [id number]    PT Stop Time 1550    PT Time Calculation (min) 44 min    Activity Tolerance Patient tolerated treatment well    Behavior During Therapy [facility] for tasks assessed/performed             Past Medical History:  Diagnosis Date   Breast cancer ([place]) 02/2022   left breast IDC with DCIS   Family history of breast cancer 03/20/2022   Family history of pancreatic cancer 03/20/2022   HIV infection ([place]) 1993   Hypertension    Past Surgical History:  Procedure Laterality Date   ABDOMINAL HYSTERECTOMY     APPENDECTOMY     BREAST BIOPSY Right 2015   BREAST BIOPSY Left 08/26/2022   [place] LT RADIOACTIVE SEED LOC 08/26/2022 [facility]AMMOGRAP[facility]   BREAST BIOPSY  08/26/2022   MM LT RADIOACTIVE SEED LOC MAMMO GUIDE 08/26/2022 [facility] MAMMOGRAPHY   BREAST LUMPECTOMY WITH RADIOACTIVE SEED LOCALIZATION Left 08/27/2022   Procedure: LEFT BREAST LUMPECTOMY WITH RADIOACTIVE SEED LOCALIZATION;  Surgeon: [name], MD;  Location: [facility];  Service: General;  Laterality: Left;   CYSTOSCOPY/RETROGRADE/URETEROSCOPY Right 11/14/2016   Procedure: CYSTOSCOPY/RETROGRADE/URETEROSCOPY/ STONE EXTRACTION/HOLMIUM LASER/STENT PLACEMENT;  Surgeon: Kathie Rhodes, MD;  Location: [facility];  Service: Urology;  Laterality: Right;   PORT-A-CATH REMOVAL Right 08/27/2022   Procedure: REMOVAL PORT-A-CATH;  Surgeon: [name], MD;  Location: [facility];  Service: General;  Laterality: Right;   PORTACATH PLACEMENT Right 03/26/2022   Procedure: INSERTION PORT-A-CATH;  Surgeon: [name], MD;  Location: [facility];  Service: General;  Laterality: Right;    RADIOACTIVE SEED GUIDED AXILLARY SENTINEL LYMPH NODE Left 08/27/2022   Procedure: RADIOACTIVE SEED GUIDED LEFT AXILLARY SENTINEL LYMPH NODE DISSECTION;  Surgeon: [name], MD;  Location: [facility];  Service: General;  Laterality: Left;   Patient Active Problem List   Diagnosis Date Noted   Port-A-Cath in place 08/08/2022   Family history of breast cancer 03/20/2022   Family history of pancreatic cancer 03/20/2022   Gastroesophageal reflux disease without esophagitis 03/12/2022   Weight loss 03/12/2022   Malignant neoplasm of upper-outer quadrant of left breast in female, estrogen receptor positive ([place]) 03/07/2022   Dental caries 12/01/2021   Other viral warts 12/01/2021   Healthcare maintenance 11/09/2018   VISUAL IMPAIRMENT 10/01/2007   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 Ninfa Linden  REFERRING DIAG: Left breast cancer  THERAPY DIAG:  Malignant neoplasm of upper-outer quadrant of left breast in female, estrogen receptor positive ([place])  Abnormal posture  Aftercare following surgery for neoplasm  Stiffness of left shoulder, not elsewhere classified  Rationale for Evaluation and Treatment: Rehabilitation  ONSET DATE: 03/19/22  SUBJECTIVE:  SUBJECTIVE STATEMENT: Declines neuropathy.  Although I can only reach up not very high. It feels more stiff.   PERTINENT HISTORY:  Post left lumpectomy 08/27/22.  3 of 4 lymph nodes positive. Neoadjuvant chemotherapy completed and will be having radiation. It is ER positive, PR negative, and HER2 negative with a Ki67 of 10%.  She has a positive axillary node. She also has HIV disease which she has not shared with her family.   PATIENT GOALS:  Reassess how my recovery is going related to arm function, pain, and swelling.  PAIN:  Are you having pain? No   PRECAUTIONS: Recent Surgery, left UE Lymphedema risk, HIV  ACTIVITY LEVEL / LEISURE: Retired.  Getting the seat belt is hard.     OBJECTIVE:   PATIENT SURVEYS:  QUICK DASH: 43% from 0%   OBSERVATIONS: Very guarded.  Does not want to touch operated side. Well healed incisions. Still wearing compression.   POSTURE:  Forward shoulder, thoracic kyphosis   LYMPHEDEMA ASSESSMENT:   UPPER EXTREMITY AROM/PROM:   A/PROM RIGHT   eval    Shoulder extension 50  Shoulder flexion 136  Shoulder abduction 149  Shoulder internal rotation 64  Shoulder external rotation 73                          (Blank rows = not tested)   A/PROM LEFT   eval 09/17/22  Shoulder extension 54 60  Shoulder flexion 137 90 - tight in axilla   Shoulder abduction 151 85 - tight in axilla   Shoulder internal rotation 59   Shoulder external rotation 83 83                          (Blank rows = not tested)    UPPER EXTREMITY STRENGTH: WNL    LYMPHEDEMA ASSESSMENTS:    LANDMARK RIGHT   eval  10 cm proximal to olecranon process 25.7  Olecranon process 22.1  10 cm proximal to ulnar styloid process 19.6  Just proximal to ulnar styloid process 14.7  Across hand at thumb web space 17.6  At base of 2nd digit 6.2  (Blank rows = not tested)   LANDMARK LEFT   eval  10 cm proximal to olecranon process 25.2  Olecranon process 21.5  10 cm proximal to ulnar styloid process 18.8  Just proximal to ulnar styloid process 14.7  Across hand at thumb web space 17.2  At base of 2nd digit 6  (Blank rows = not tested)  TODAY"S TREATMENT: 09/17/22: Brief eval performed - switched to treatment due to fear of movement and cording noted.  Supine dowel flexion:   Pt only able to perform  dowel chest press due to fear and then after 5 chest presses she was able to try flexion. Vcs to keep elbows straight.   Supine chest stretch: elbows in and then out as tolerated x 5 Wall flexion x 2 Wall abduction x 2 Pulleys into flexion x 58mn as tolerated.    PATIENT EDUCATION:  Education details: HEP to focus on per below, cording, importance of touch and cortical mapping etc to improve function Person educated: Patient Education method: Explanation, Demonstration, Tactile cues, Verbal cues, and Handouts Education comprehension: verbalized understanding and needs further education  HOME EXERCISE PROGRAM: Reviewed previously given post op HEP: updated to supine dowel flexion, supine chest stretch, added wall flexion Gave pulley handout  ASSESSMENT:  CLINICAL IMPRESSION: Pt returns 3  weeks post lumpectomy with cording and decreased AROM as well as fear of movement and overall surgical site.  Did not perform whole post op re-eval and switched to more movement education and pain science.  Pt demonstrates also 120deg of flexion post treatment and feels encouraged that she is doing okay.    Pt will benefit from skilled therapeutic intervention to improve on the following deficits: Decreased knowledge of precautions, impaired UE functional use, pain, decreased ROM, postural dysfunction.   PT treatment/interventions: ADL/Self care home management, Therapeutic exercises, Therapeutic activity, Neuromuscular re-education, Patient/Family education, Self Care, DME instructions, Manual therapy, and Re-evaluation   GOALS: Goals reviewed with patient? Yes  LONG TERM GOALS:  (STG=LTG)  GOALS Name Target Date  Goal status  1 Pt will demonstrate she has regained full shoulder ROM and function post operatively compared to baselines.  Baseline: 09/12/22 INITIAL  2 Pt will be educated on lymphedema risk reduction or be signed up for ABC class 09/12/22 INITIAL  3 Pt will be able to tolerate radiation  position  09/12/22 initial  4 Pt will decrease DQASH to 10% or less to demonstrate improved function 09/12/22 initial     PLAN:  PT FREQUENCY/DURATION: scheduled for 2x to start and then we will add more as needed up to 2x per week x 4 weeks  PLAN FOR NEXT SESSION: left shoulder AAROM/PROM, cording work PRN, (need to finish post op education/eval including circumferences, lymphedema education, scar education, SOZO, etc.) Did not give out post op education sheet.    [facility]  [address]  [place] [address]  [phone]  After Breast Cancer Class It is recommended you attend the ABC class to be educated on lymphedema risk reduction. This class is free of charge and lasts for 1 hour. It is a 1-time class. You will need to download the Webex app either on your phone or computer. We will send you a link the night before or the morning of the class. You should be able to click on that link to join the class. This is not a confidential class. You don't have to turn your camera on, but other participants may be able to see your email address.  Scar massage You can begin gentle scar massage to you incision sites. Gently place one hand on the incision and move the skin (without sliding on the skin) in various directions. Do this for a few minutes and then you can gently massage either coconut oil or vitamin E cream into the scars.  Compression garment You should continue wearing your compression bra until you feel like you no longer have swelling.  Home exercise Program Continue doing the exercises you were given until you feel like you can do them without feeling any tightness at the end.   Walking Program Studies show that 30 minutes of walking per day (fast enough to elevate your heart rate) can significantly reduce the risk of a cancer recurrence. If you can't walk due to other medical reasons, we encourage you to find another activity you could do (like  a stationary bike or water exercise).  Posture After breast cancer surgery, people frequently sit with rounded shoulders posture because it puts their incisions on slack and feels better. If you sit like this and scar tissue forms in that position, you can become very tight and have pain sitting or standing with good posture. Try to be aware of your posture and sit and stand up tall to  heal properly.  Follow up PT: It is recommended you return every 3 months for the first 3 years following surgery to be assessed on the SOZO machine for an L-Dex score. This helps prevent clinically significant lymphedema in 95% of patients. These follow up screens are 10 minute appointments that you are not billed for.  Stark Bray, PT 09/17/2022, 3:52 PM

## 2022-09-17 NOTE — Progress Notes (Signed)
09/17/2022  HPI: Veronica Robles is a 67 y.o. female who presents to the [facility] for HIV follow-up.  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   FOSTEMSAVIR TROMETHAMINE (RUKOBIA) 600 MG TB12 ER TABLET    Take 1 tablet (600 mg total) by mouth 2 (two) times daily.   HYDROCHLOROTHIAZIDE (HYDRODIURIL) 25 MG TABLET    Take 1 tablet (25 mg total) by mouth daily.   LIDOCAINE-PRILOCAINE (EMLA) CREAM    Apply to affected area once   ONDANSETRON (ZOFRAN) 8 MG TABLET    Take 1 tablet (8 mg) by mouth every 8 hours as needed for nausea/vomiting. Start third day after doxorubicin/cyclophosphamide chemotherapy.   PROCHLORPERAZINE (COMPAZINE) 10 MG TABLET    Take 1 tablet (10 mg total) by mouth every 6 (six) hours as needed for nausea or vomiting.   SQ INJECTION LENACAPAVIR (SUNLENCA) 463.5 MG/1.5ML SQ INJECTION    Inject 3 mLs (927 mg total) into the skin every 6 (six) months. Administer each injection subcutaneously at separate sites in the abdomen (more  or equal to 2 inches from the navel).   SULFAMETHOXAZOLE-TRIMETHOPRIM (BACTRIM) 200-40 MG/5ML SUSPENSION    Take 20 mLs by mouth daily.  Modified Medications   No medications on file  Discontinued Medications   No medications on file    Allergies: Allergies  Allergen Reactions   Penicillins Rash    Has patient had a PCN reaction causing immediate rash, facial/tongue/throat swelling, SOB or lightheadedness with hypotension: No Has patient had a PCN reaction causing severe rash involving mucus membranes or skin necrosis: No Has patient had a PCN reaction that required hospitalization No Has patient had a PCN reaction occurring within the last 10 years: No If all of the above answers are "NO", then may proceed with Cephalosporin use.     Past Medical History: Past Medical History:  Diagnosis Date   Breast cancer ([place]) 02/2022   left breast IDC with DCIS   Family history of breast cancer 03/20/2022   Family history of pancreatic cancer 03/20/2022   HIV infection ([place]) 1993   Hypertension     Social History: Social History   Socioeconomic History   Marital status: Single    Spouse name: Not on file   Number of children: Not on file   Years of education: Not on file   Highest education level: Not on file  Occupational History   Not on file  Tobacco Use   Smoking status: Former    Packs/day: 0.10    Types: Cigarettes   Smokeless  tobacco: Never  Vaping Use   Vaping Use: Never used  Substance and Sexual Activity   Alcohol use: No    Alcohol/week: 0.0 standard drinks of alcohol   Drug use: No   Sexual activity: Not Currently    Partners: Male    Birth control/protection: Surgical    Comment: declined condoms  Other Topics Concern   Not on file  Social History Narrative   Not on file   Social Determinants of Health   Financial Resource Strain: Low Risk  (08/14/2022)   Overall Financial Resource Strain (CARDIA)    Difficulty of Paying Living Expenses: Not hard at  all  Food Insecurity: No Food Insecurity (08/14/2022)   Hunger Vital Sign    Worried About Running Out of Food in the Last Year: Never true    Ran Out of Food in the Last Year: Never true  Transportation Needs: No Transportation Needs (08/14/2022)   PRAPARE - Hydrologist (Medical): No    Lack of Transportation (Non-Medical): No  Physical Activity: Inactive (08/14/2022)   Exercise Vital Sign    Days of Exercise per Week: 0 days    Minutes of Exercise per Session: 0 min  Stress: No Stress Concern Present (08/14/2022)   [name]    Feeling of Stress : Not at all  Social Connections: Not on file    Labs: Lab Results  Component Value Date   HIV1RNAQUANT <20 (H) 08/12/2022   HIV1RNAQUANT <20 (H) 05/13/2022   HIV1RNAQUANT 52 (H) 04/29/2022   CD4TABS 128 (L) 08/12/2022   CD4TABS 302 (L) 06/13/2021   CD4TABS 278 (L) 11/01/2020    RPR and STI Lab Results  Component Value Date   LABRPR NON-REACTIVE 05/01/2020   LABRPR NON-REACTIVE 05/10/2018   LABRPR NON REAC 10/30/2015   LABRPR NON REAC 05/24/2015   LABRPR NON REAC 12/05/2013    STI Results GC CT  05/01/2020  2:25 PM Negative  Negative   03/04/2016 12:00 AM Negative  Negative   12/05/2013 12:00 AM  CT: Negative     Hepatitis B Lab Results  Component Value Date   HEPBSAB INDETER (A) 08/08/2009   HEPBSAG NEG 08/08/2009   HEPBCAB POS (A) 08/08/2009   Hepatitis C No results found for: "HEPCAB", "HCVRNAPCRQN" Hepatitis A Lab Results  Component Value Date   HAV POS (A) 08/08/2009   Lipids: Lab Results  Component Value Date   CHOL 203 (H) 01/04/2020   TRIG 133 01/04/2020   HDL 51 01/04/2020   CHOLHDL 4.0 01/04/2020   VLDL 37 (H) 10/30/2015   LDLCALC 127 (H) 01/04/2020    Current HIV Regimen: Sunlenca + Rukobia + Descovy  Assessment: Veronica Robles is here today to check in and follow up for her HIV infection. She has finished  chemotherapy and had a left breast lumpectomy on 08/27/22. She is healing well and doing better since her surgery. She is glad to be done with chemo but will start radiation soon. She is also doing therapy after her surgery which she will start today. Explained that her viral load is still undetectable when we checked last month and congratulated her on this. Told her how proud we are of her and to keep up the fantastic work. Advised her that her CD4 count is <200 still and to continue the liquid Bactrim. She agrees. She still has to drink lots of water when she swallows the [place] but states that it is getting  better. She is due for her next Sunlenca injection next month so will schedule that appointment for her today. Asked her to reach out to me with any issues or needs.   Plan: - Continue Descovy and [place] - Next Sunlenca administration on 10/08/22 at 245pm - F/u with Dr. Baxter Flattery on 11/18/22 at 145pm  Trayden Brandy L. Kamala Kolton, PharmD, BCIDP, [place], CPP Clinical Pharmacist Practitioner [facility] 09/17/2022, 2:27 PM

## 2022-09-22 ENCOUNTER — Telehealth: Payer: Self-pay | Admitting: Radiation Oncology

## 2022-09-22 ENCOUNTER — Ambulatory Visit: Payer: Medicare HMO

## 2022-09-22 DIAGNOSIS — Z483 Aftercare following surgery for neoplasm: Secondary | ICD-10-CM

## 2022-09-22 DIAGNOSIS — M25612 Stiffness of left shoulder, not elsewhere classified: Secondary | ICD-10-CM

## 2022-09-22 DIAGNOSIS — Z170 Estrogen receptor positive status [ER+]: Secondary | ICD-10-CM

## 2022-09-22 DIAGNOSIS — C50412 Malignant neoplasm of upper-outer quadrant of left female breast: Secondary | ICD-10-CM | POA: Diagnosis not present

## 2022-09-22 DIAGNOSIS — R293 Abnormal posture: Secondary | ICD-10-CM

## 2022-09-22 NOTE — Telephone Encounter (Signed)
2/26 @ 2:30 pm Left voicemail with patient's contact # also left message with patient's son for patient to call our office to be reschedule for 2/27 over telephone with Shona Simpson.

## 2022-09-22 NOTE — Therapy (Signed)
OUTPATIENT PHYSICAL THERAPY BREAST CANCER TREATMENT   Patient Name: Veronica Robles MRN: [id number] DOB:Dec 04, 1955, 67 y.o., female Today's Date: 09/22/2022  END OF SESSION:  PT End of Session - 09/22/22 1507     Visit Number 3    Number of Visits 8    Authorization Type None needed    PT Start Time [id number]    PT Stop Time [id number]    PT Time Calculation (min) 46 min    Activity Tolerance Patient tolerated treatment well    Behavior During Therapy [facility] for tasks assessed/performed             Past Medical History:  Diagnosis Date   Breast cancer ([place]) 02/2022   left breast IDC with DCIS   Family history of breast cancer 03/20/2022   Family history of pancreatic cancer 03/20/2022   HIV infection ([place]) 1993   Hypertension    Past Surgical History:  Procedure Laterality Date   ABDOMINAL HYSTERECTOMY     APPENDECTOMY     BREAST BIOPSY Right 2015   BREAST BIOPSY Left 08/26/2022   [place] LT RADIOACTIVE SEED LOC 08/26/2022 [facility]   BREAST BIOPSY  08/26/2022   MM LT RADIOACTIVE SEED LOC MAMMO GUIDE 08/26/2022 [facility]AMMOGR[facility]   BREAST LUMPECTOMY WITH RADIOACTIVE SEED LOCALIZATION Left 08/27/2022   Procedure: LEFT BREAST LUMPECTOMY WITH RADIOACTIVE SEED LOCALIZATION;  Surgeon: [name], MD;  Location: [place];  Service: General;  Laterality: Left;   CYSTOSCOPY/RETROGRADE/URETEROSCOPY Right 11/14/2016   Procedure: CYSTOSCOPY/RETROGRADE/URETEROSCOPY/ STONE EXTRACTION/HOLMIUM LASER/STENT PLACEMENT;  Surgeon: Kathie Rhodes, MD;  Location: [facility];  Service: Urology;  Laterality: Right;   PORT-A-CATH REMOVAL Right 08/27/2022   Procedure: REMOVAL PORT-A-CATH;  Surgeon: [name], MD;  Location: [place];  Service: General;  Laterality: Right;   PORTACATH PLACEMENT Right 03/26/2022   Procedure: INSERTION PORT-A-CATH;  Surgeon: [name], MD;  Location: [place];  Service: General;  Laterality: Right;   RADIOACTIVE  SEED GUIDED AXILLARY SENTINEL LYMPH NODE Left 08/27/2022   Procedure: RADIOACTIVE SEED GUIDED LEFT AXILLARY SENTINEL LYMPH NODE DISSECTION;  Surgeon: [name], MD;  Location: [place];  Service: General;  Laterality: Left;   Patient Active Problem List   Diagnosis Date Noted   Port-A-Cath in place 08/08/2022   Family history of breast cancer 03/20/2022   Family history of pancreatic cancer 03/20/2022   Gastroesophageal reflux disease without esophagitis 03/12/2022   Weight loss 03/12/2022   Malignant neoplasm of upper-outer quadrant of left breast in female, estrogen receptor positive ([place]) 03/07/2022   Dental caries 12/01/2021   Other viral warts 12/01/2021   Healthcare maintenance 11/09/2018   VISUAL IMPAIRMENT 10/01/2007   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 Ninfa Linden  REFERRING DIAG: Left breast cancer  THERAPY DIAG:  Malignant neoplasm of upper-outer quadrant of left breast in female, estrogen receptor positive (Veronica Robles)  Abnormal posture  Aftercare following surgery for neoplasm  Stiffness of left shoulder, not elsewhere classified  Rationale for Evaluation and Treatment: Rehabilitation  ONSET DATE: 03/19/22  SUBJECTIVE:  SUBJECTIVE STATEMENT: I did pretty well last visit and I have been practicing.  PERTINENT HISTORY:  Post left lumpectomy 08/27/22.  3 of 4 lymph nodes positive. Neoadjuvant chemotherapy completed and will be having radiation. It is ER positive, PR negative, and HER2 negative with a Ki67 of 10%. She has a positive axillary node. She also  has HIV disease which she has not shared with her family.   PATIENT GOALS:  Reassess how my recovery is going related to arm function, pain, and swelling.  PAIN:  Are you having pain? No   PRECAUTIONS: Recent Surgery, left UE Lymphedema risk, HIV  ACTIVITY LEVEL / LEISURE: Retired.  Getting the seat belt is hard.     OBJECTIVE:   PATIENT SURVEYS:  QUICK DASH: 43% from 0%   OBSERVATIONS: Very guarded.  Does not want to touch operated side. Well healed incisions. Still wearing compression.   POSTURE:  Forward shoulder, thoracic kyphosis   LYMPHEDEMA ASSESSMENT:   UPPER EXTREMITY AROM/PROM:   A/PROM RIGHT   eval    Shoulder extension 50  Shoulder flexion 136  Shoulder abduction 149  Shoulder internal rotation 64  Shoulder external rotation 73                          (Blank rows = not tested)   A/PROM LEFT   eval 09/17/22  Shoulder extension 54 60  Shoulder flexion 137 90 - tight in axilla   Shoulder abduction 151 85 - tight in axilla   Shoulder internal rotation 59   Shoulder external rotation 83 83                          (Blank rows = not tested)    UPPER EXTREMITY STRENGTH: WNL    LYMPHEDEMA ASSESSMENTS:    LANDMARK RIGHT   eval  10 cm proximal to olecranon process 25.7  Olecranon process 22.1  10 cm proximal to ulnar styloid process 19.6  Just proximal to ulnar styloid process 14.7  Across hand at thumb web space 17.6  At base of 2nd digit 6.2  (Blank rows = not tested)   LANDMARK LEFT   eval LEFT 09/22/2022  10 cm proximal to olecranon process 25.2 24.5  Olecranon process 21.5 21.4  10 cm proximal to ulnar styloid process 18.8 18.5  Just proximal to ulnar styloid process 14.7 14.5  Across hand at thumb web space 17.2 18.0  At base of 2nd digit 6 6.0  (Blank rows = not tested)  TODAY"S TREATMENT: 09/22/2022 Measured circumference of left UE; no sign of swelling Pulleys for flexion, scaption and abd x 1:30 ea Ball rolls on wall x 5 ea forward  and abd. Standing lat stretch at bar x 3 Supine wand flexion and scaption x 3 PROM left shoulder flexion, scaption, abd, IR and ER Foam pad made for axillary border of bra MFR to left axillary cording with arm on 2nd pillow, repeated elbow extension  09/17/22: Brief eval performed - switched to treatment due to fear of movement and cording noted.  Supine dowel flexion:   Pt only able to perform dowel chest press due to fear and then after 5 chest presses she was able to try flexion. Vcs to keep elbows straight.   Supine chest stretch: elbows in and then out as tolerated x 5 Wall flexion x 2 Wall abduction x 2 Pulleys into flexion x 64mn as tolerated.  PATIENT EDUCATION:  Education details: HEP to focus on per below, cording, importance of touch and cortical mapping etc to improve function Person educated: Patient Education method: Explanation, Demonstration, Tactile cues, Verbal cues, and Handouts Education comprehension: verbalized understanding and needs further education  HOME EXERCISE PROGRAM: Reviewed previously given post op HEP: updated to supine dowel flexion, supine chest stretch, added wall flexion Gave pulley handout  ASSESSMENT:  CLINICAL IMPRESSION:  Pt demonstrates very good improvement in left shoulder ROM. She still gets tense at times and requires VC's to relax, but doing much better overall.  Pt will benefit from skilled therapeutic intervention to improve on the following deficits: Decreased knowledge of precautions, impaired UE functional use, pain, decreased ROM, postural dysfunction.   PT treatment/interventions: ADL/Self care home management, Therapeutic exercises, Therapeutic activity, Neuromuscular re-education, Patient/Family education, Self Care, DME instructions, Manual therapy, and Re-evaluation   GOALS: Goals reviewed with patient? Yes  LONG TERM GOALS:  (STG=LTG)  GOALS Name Target Date  Goal status  1 Pt will demonstrate she has regained  full shoulder ROM and function post operatively compared to baselines.  Baseline: 09/12/22 INITIAL  2 Pt will be educated on lymphedema risk reduction or be signed up for ABC class 09/12/22 INITIAL  3 Pt will be able to tolerate radiation position  09/12/22 initial  4 Pt will decrease DQASH to 10% or less to demonstrate improved function 09/12/22 initial     PLAN:  PT FREQUENCY/DURATION: scheduled for 2x to start and then we will add more as needed up to 2x per week x 4 weeks  PLAN FOR NEXT SESSION: left shoulder AAROM/PROM, cording work PRN, (need to finish post op education/eval including circumferences, lymphedema education, scar education, SOZO, etc.) Did not give out post op education sheet.    [facility]  [address]  [place] [address]  [phone]  After Breast Cancer Class It is recommended you attend the ABC class to be educated on lymphedema risk reduction. This class is free of charge and lasts for 1 hour. It is a 1-time class. You will need to download the Webex app either on your phone or computer. We will send you a link the night before or the morning of the class. You should be able to click on that link to join the class. This is not a confidential class. You don't have to turn your camera on, but other participants may be able to see your email address.  Scar massage You can begin gentle scar massage to you incision sites. Gently place one hand on the incision and move the skin (without sliding on the skin) in various directions. Do this for a few minutes and then you can gently massage either coconut oil or vitamin E cream into the scars.  Compression garment You should continue wearing your compression bra until you feel like you no longer have swelling.  Home exercise Program Continue doing the exercises you were given until you feel like you can do them without feeling any tightness at the end.   Walking Program Studies show  that 30 minutes of walking per day (fast enough to elevate your heart rate) can significantly reduce the risk of a cancer recurrence. If you can't walk due to other medical reasons, we encourage you to find another activity you could do (like a stationary bike or water exercise).  Posture After breast cancer surgery, people frequently sit with rounded shoulders posture because it puts their incisions on  slack and feels better. If you sit like this and scar tissue forms in that position, you can become very tight and have pain sitting or standing with good posture. Try to be aware of your posture and sit and stand up tall to heal properly.  Follow up PT: It is recommended you return every 3 months for the first 3 years following surgery to be assessed on the SOZO machine for an L-Dex score. This helps prevent clinically significant lymphedema in 95% of patients. These follow up screens are 10 minute appointments that you are not billed for.  Claris Pong, PT 09/22/2022, 3:54 PM

## 2022-09-23 ENCOUNTER — Ambulatory Visit
Admission: RE | Admit: 2022-09-23 | Discharge: 2022-09-23 | Disposition: A | Discharge status: Home / Self Care | Payer: Medicare HMO | Source: Ambulatory Visit | Attending: Radiation Oncology | Admitting: Radiation Oncology

## 2022-09-23 ENCOUNTER — Encounter: Payer: Self-pay | Admitting: Radiation Oncology

## 2022-09-23 ENCOUNTER — Telehealth: Payer: Self-pay | Admitting: Radiation Oncology

## 2022-09-23 VITALS — BP 144/85 | HR 83 | Temp 96.2°F | Resp 18 | Ht 62.0 in | Wt 123.0 lb

## 2022-09-23 DIAGNOSIS — Z170 Estrogen receptor positive status [ER+]: Secondary | ICD-10-CM

## 2022-09-23 NOTE — Telephone Encounter (Signed)
2/27 @ 9:22 am Left voicemail for patient to call our office.

## 2022-09-23 NOTE — Progress Notes (Incomplete)
Radiation Oncology         [phone] ________________________________  Outpatient Follow Up - Conducted via telephone at patient request.  I spoke with the patient to conduct this consult visit via telephone. The patient was notified in advance and was offered an in person or telemedicine meeting to allow for face to face communication but instead preferred to proceed with a telephone visit.  Name: Veronica Robles        MRN: [id number]  Date of Service: 09/23/2022 DOB: 03/15/1956  CC:[name], MD  Nicholas Lose, MD     REFERRING PHYSICIAN: Nicholas Lose, MD   DIAGNOSIS: The encounter diagnosis was Malignant neoplasm of upper-outer quadrant of left breast in female, estrogen receptor positive ([name]).   HISTORY OF PRESENT ILLNESS: Veronica Robles is a 67 y.o. female originally seen in the multidisciplinary breast clinic for a new diagnosis of left breast cancer. The patient was noted to have a possible mass in the left breast and left axillary adenopathy she underwent further diagnostic work-up which showed a mass in the 3 o'clock position of the left breast measuring 9 mm in greatest dimension and between the mass and the nipple there were numerous dilated ducts, asymmetric changes compared to other quadrants of the breast and the duct appeared to terminate near the spiculated mass however no intraductal mass was noted.  Within the left axilla there were 4 abnormal appearing lymph nodes the largest had cortical thickening of 7 mm.  She underwent a biopsy on 02/28/2022 of the breast and a axillary lymph node.  Within the breast invasive lobular carcinoma was seen with an overall grade of tumor.  The tumor was ER positive, PR negative, HER2 negative with a Ki-67 of 10%, and her left axillary lymph node was involved with metastatic carcinoma.     Since her last visit, the patient has undergone CT and bone scan staging.  Her CT scan of the chest abdomen and pelvis showed left axillary  adenopathy, evidence of atherosclerotic and renal disease, and her bone scan was negative for uptake.  An MRI of the breast on 04/02/2022 showed her known left breast cancer, 9 mm of enhancement anterior and lateral to the biopsy site were noted, and bilateral benign ductal ectasia was identified, and no abnormal nodes were seen by MRI. She begin chemotherapy on 03/27/22 and completed this on 08/08/22.  Posttreatment imaging on 08/09/2022 with MRI showed interval improvement in the known left breast cancer measuring 1.1 cm, 1 abnormal lymph node was seen in the high left axilla.  No other new findings were appreciated.  She underwent a left lumpectomy with targeted lymph node dissection on 08/27/2022 with Dr. Ninfa Linden.  Final pathology showed a grade 2 invasive ductal carcinoma that measured 1.9 cm.  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  She had 4 sampled lymph nodes, 3 of which had metastatic disease.  She is contacted today to discuss treatment recommendations of adjuvant radiotherapy to the left breast and regional nodes.   PREVIOUS RADIATION THERAPY: No   PAST MEDICAL HISTORY:  Past Medical History:  Diagnosis Date   Breast cancer ([place]) 02/2022   left breast IDC with DCIS   Family history of breast cancer 03/20/2022   Family history of pancreatic cancer 03/20/2022   HIV infection ([place]) 1993   Hypertension        PAST SURGICAL HISTORY: Past Surgical History:  Procedure Laterality Date   ABDOMINAL HYSTERECTOMY  APPENDECTOMY     BREAST BIOPSY Right 2015   BREAST BIOPSY Left 08/26/2022   [place] LT RADIOACTIVE SEED LOC 08/26/2022 [facility] MAMMOGRAPHY   BREAST BIOPSY  08/26/2022   MM LT RADIOACTIVE SEED LOC MAMMO GUIDE 08/26/2022 [facility] MAMMOGRAPHY   BREAST LUMPECTOMY WITH RADIOACTIVE SEED LOCALIZATION Left 08/27/2022   Procedure: LEFT BREAST LUMPECTOMY WITH RADIOACTIVE SEED LOCALIZATION;  Surgeon: [name], MD;  Location: [facility];  Service:  General;  Laterality: Left;   CYSTOSCOPY/RETROGRADE/URETEROSCOPY Right 11/14/2016   Procedure: CYSTOSCOPY/RETROGRADE/URETEROSCOPY/ STONE EXTRACTION/HOLMIUM LASER/STENT PLACEMENT;  Surgeon: Kathie Rhodes, MD;  Location: [facility];  Service: Urology;  Laterality: Right;   PORT-A-CATH REMOVAL Right 08/27/2022   Procedure: REMOVAL PORT-A-CATH;  Surgeon: [name], MD;  Location: [facility];  Service: General;  Laterality: Right;   PORTACATH PLACEMENT Right 03/26/2022   Procedure: INSERTION PORT-A-CATH;  Surgeon: [name], MD;  Location: [facility];  Service: General;  Laterality: Right;   RADIOACTIVE SEED GUIDED AXILLARY SENTINEL LYMPH NODE Left 08/27/2022   Procedure: RADIOACTIVE SEED GUIDED LEFT AXILLARY SENTINEL LYMPH NODE DISSECTION;  Surgeon: [name], MD;  Location: [facility];  Service: General;  Laterality: Left;     FAMILY HISTORY:  Family History  Problem Relation Age of Onset   Hypertension Mother    Pancreatic cancer Father 74   Breast cancer Sister        dx 64s   Lung cancer Sister        d. 20     SOCIAL HISTORY:  reports that she has quit smoking. Her smoking use included cigarettes. She smoked an average of .1 packs per day. She has never used smokeless tobacco. She reports that she does not drink alcohol and does not use drugs.  The patient is single and lives in [place]. She enjoys spending time with her teenage grandchildren.    ALLERGIES: Penicillins   MEDICATIONS:  Current Outpatient Medications  Medication Sig Dispense Refill   Blood Pressure Monitoring (ADULT BLOOD PRESSURE CUFF LG) KIT       emtricitabine-tenofovir AF (DESCOVY) 200-25 MG tablet Take 1 tablet by mouth daily. 30 tablet 5   fostemsavir tromethamine (RUKOBIA) 600 MG TB12 ER tablet Take 1 tablet (600 mg total) by mouth 2 (two) times daily. 60 tablet 5   hydrochlorothiazide (HYDRODIURIL) 25 MG tablet Take 1 tablet (25 mg total) by  mouth daily. 90 tablet 1   lidocaine-prilocaine (EMLA) cream Apply to affected area once 30 g 3   ondansetron (ZOFRAN) 8 MG tablet Take 1 tablet (8 mg) by mouth every 8 hours as needed for nausea/vomiting. Start third day after doxorubicin/cyclophosphamide chemotherapy. 30 tablet 1   prochlorperazine (COMPAZINE) 10 MG tablet Take 1 tablet (10 mg total) by mouth every 6 (six) hours as needed for nausea or vomiting. 30 tablet 1   SQ injection lenacapavir (SUNLENCA) 463.5 MG/1.5ML SQ injection Inject 3 mLs (927 mg total) into the skin every 6 (six) months. Administer each injection subcutaneously at separate sites in the abdomen (more or equal to 2 inches from the navel). 3 mL 2   sulfamethoxazole-trimethoprim (BACTRIM) 200-40 MG/5ML suspension Take 20 mLs by mouth daily. 473 mL 5   No current facility-administered medications for this encounter.   Facility-Administered Medications Ordered in Other Encounters  Medication Dose Route Frequency Provider Last Rate Last Admin   ciprofloxacin (CIPRO) IVPB 400 mg  400 mg Intravenous Q12H Kathie Rhodes, MD   400 mg at 03/26/22 [phone]  REVIEW OF SYSTEMS: On review of systems, the patient reports that she is ***     PHYSICAL EXAM:  Unable to assess due to encounter type    ECOG = ***  0 - Asymptomatic (Fully active, able to carry on all predisease activities without restriction)  1 - Symptomatic but completely ambulatory (Restricted in physically strenuous activity but ambulatory and able to carry out work of a light or sedentary nature. For example, light housework, office work)  2 - Symptomatic, <50% in bed during the day (Ambulatory and capable of all self care but unable to carry out any work activities. Up and about more than 50% of waking hours)  3 - Symptomatic, >50% in bed, but not bedbound (Capable of only limited self-care, confined to bed or chair 50% or more of waking hours)  4 - Bedbound (Completely disabled. Cannot carry on any  self-care. Totally confined to bed or chair)  5 - Death   Eustace Pen MM, Creech RH, Tormey DC, et al. [phone]). "Toxicity and response criteria of the [facility]". [place] Oncol. 5 (6): 649-55    LABORATORY DATA:  Lab Results  Component Value Date   WBC 2.9 (L) 08/08/2022   HGB 10.1 (L) 08/08/2022   HCT 30.2 (L) 08/08/2022   MCV 94.4 08/08/2022   PLT 156 08/08/2022   Lab Results  Component Value Date   NA 140 08/08/2022   K 3.9 08/08/2022   CL 108 08/08/2022   CO2 29 08/08/2022   Lab Results  Component Value Date   ALT 25 08/08/2022   AST 21 08/08/2022   ALKPHOS 58 08/08/2022   BILITOT 0.3 08/08/2022      RADIOGRAPHY: MM Breast Surgical Specimen  Result Date: 08/27/2022 CLINICAL DATA:  Evaluate 2 specimens EXAM: SPECIMEN RADIOGRAPH OF THE LEFT BREAST COMPARISON:  Previous exam(s). FINDINGS: Status post excision of the left breast. The radioactive seeds and biopsy marker clips are present, completely intact, and were marked for pathology. IMPRESSION: Specimen radiograph of the left breast. Electronically Signed   By: Dorise Bullion III M.D.   On: 08/27/2022 09:53  MM Breast Surgical Specimen  Result Date: 08/27/2022 CLINICAL DATA:  Evaluate 2 specimens EXAM: SPECIMEN RADIOGRAPH OF THE LEFT BREAST COMPARISON:  Previous exam(s). FINDINGS: Status post excision of the left breast. The radioactive seeds and biopsy marker clips are present, completely intact, and were marked for pathology. IMPRESSION: Specimen radiograph of the left breast. Electronically Signed   By: Dorise Bullion III M.D.   On: 08/27/2022 09:53  MM LT RADIOACTIVE SEED LOC MAMMO GUIDE  Result Date: 08/26/2022 CLINICAL DATA:  67 year old female with history of left breast cancer presenting for seed localization. EXAM: MAMMOGRAPHIC GUIDED RADIOACTIVE SEED LOCALIZATION OF THE LEFT BREAST COMPARISON:  Previous exam(s). FINDINGS: Patient presents for radioactive seed localization prior to left  breast lumpectomy. I met with the patient and we discussed the procedure of seed localization including benefits and alternatives. We discussed the high likelihood of a successful procedure. We discussed the risks of the procedure including infection, bleeding, tissue injury and further surgery. We discussed the low dose of radioactivity involved in the procedure. Informed, written consent was given. The usual time-out protocol was performed immediately prior to the procedure. Using mammographic guidance, sterile technique, 1% lidocaine and an I-125 radioactive seed, the ribbon shaped biopsy marking clip in the left breast was localized using a lateral approach. The follow-up mammogram images confirm the seed in the expected location and were marked  for Dr. Ninfa Linden. Follow-up survey of the patient confirms presence of the radioactive seed. Order number of I-125 seed:  [id number]. Total activity: 0.242 mCi reference Date: July 16, 2022 The patient tolerated the procedure well and was released from the [facility]. She was given instructions regarding seed removal. IMPRESSION: Radioactive seed localization left breast. No apparent complications. Electronically Signed   By: Audie Pinto M.D.   On: 08/26/2022 13:11  [place] LT RADIOACTIVE SEED LOC  Result Date: 08/26/2022 CLINICAL DATA:  67 year old female presenting for seed localization of the left axilla. EXAM: ULTRASOUND GUIDED RADIOACTIVE SEED LOCALIZATION OF THE LEFT AXILLA COMPARISON:  Previous exam(s). FINDINGS: Patient presents for radioactive seed localization prior to left axillary dissection. I met with the patient and we discussed the procedure of seed localization including benefits and alternatives. We discussed the high likelihood of a successful procedure. We discussed the risks of the procedure including infection, bleeding, tissue injury and further surgery. We discussed the low dose of radioactivity involved in the procedure. Informed,  written consent was given. The usual time-out protocol was performed immediately prior to the procedure. Using ultrasound guidance, sterile technique, 1% lidocaine and an I-125 radioactive seed, the biopsy lymph node in the left axilla was localized using a lateral approach. The follow-up mammogram images confirm the seed in the expected location and were marked for Dr. Ninfa Linden. Follow-up survey of the patient confirms presence of the radioactive seed. Order number of I-125 seed:  [id number]. Total activity: 0.242 mCi reference Date: July 16, 2022 The patient tolerated the procedure well and was released from the [facility]. She was given instructions regarding seed removal. IMPRESSION: Radioactive seed localization left axilla. No apparent complications. Electronically Signed   By: Audie Pinto M.D.   On: 08/26/2022 13:09  MM CLIP PLACEMENT LEFT  Result Date: 08/26/2022 CLINICAL DATA:  Post procedure mammogram for radioactive seed placement. EXAM: 3D DIAGNOSTIC LEFT MAMMOGRAM POST RADIOACTIVE SEED PLACEMENT COMPARISON:  Previous exam(s). FINDINGS: 3D Mammographic images were obtained following seed placement in the left axilla. The radioactive seed appears appropriately placed in the biopsy lymph node. IMPRESSION: Appropriate positioning of the radioactive seed in the left axilla. Final Assessment: Post Procedure Mammograms for Marker Placement Electronically Signed   By: Audie Pinto M.D.   On: 08/26/2022 13:08      IMPRESSION/PLAN: 1. Stage IIA, cT1bN1M0, grade 3, ER positive invasive lobular carcinoma of the left breast  with [id number] residual disease following neoadjuvant chemotherapy. Dr. Lisbeth Renshaw has reviewed her final pathology and course to date.  He recommends external radiotherapy to the breast and regional lymph nodes to reduce risks of local recurrence followed by antiestrogen therapy. We discussed the risks, benefits, short, and long term effects of radiotherapy, as well as the  curative intent, and the patient is interested in proceeding. I reviewed the delivery and logistics of radiotherapy and that Dr. Lisbeth Renshaw recommends 6 1/2 weeks of radiotherapy to the left breast and regional nodes with deep inspiration breath-hold technique. She will simulate tomorrow at which time she will sign written consent to proceed.      This encounter was conducted via telephone.  The patient has provided two factor identification and has given verbal consent for this type of encounter and has been advised to only accept a meeting of this type in a secure network environment. The time spent during this encounter was *** minutes including preparation, discussion, and coordination of the patient's care. The attendants for this meeting include Hayden Pedro  and  [name].  During the encounter,  Hayden Pedro was located at [facility] Radiation Oncology Department.  RAQUEAL MARIETTA was located at home.      Carola Rhine, [facility]    **Disclaimer: This note was dictated with voice recognition software. Similar sounding words can inadvertently be transcribed and this note may contain transcription errors which may not have been corrected upon publication of note.**

## 2022-09-23 NOTE — Progress Notes (Signed)
Nursing interview for Malignant neoplasm of upper-outer quadrant of left breast in female, estrogen receptor positive ([place]).  Patient identity verified. Patient doing well. No issues reported at this time.  Meaningful use complete. Hysterectomy  Ht '5\' 2"'$  (1.575 m)   Wt 123 lb (55.8 kg)   BMI 22.50 kg/m   This concludes the interview.   Leandra Kern, LPN

## 2022-09-23 NOTE — Telephone Encounter (Signed)
2/27 @ 1:16 pm Left voicemail for patient to call the office to reschedule for today.  Also left voicemail with patient's son for patient to call our office.

## 2022-09-24 ENCOUNTER — Ambulatory Visit: Payer: Medicare HMO

## 2022-09-24 ENCOUNTER — Ambulatory Visit
Admission: RE | Admit: 2022-09-24 | Discharge: 2022-09-24 | Disposition: A | Discharge status: Home / Self Care | Payer: Medicare HMO | Source: Ambulatory Visit | Attending: Radiation Oncology | Admitting: Radiation Oncology

## 2022-09-24 ENCOUNTER — Encounter: Payer: Self-pay | Admitting: Radiation Oncology

## 2022-09-24 ENCOUNTER — Ambulatory Visit: Payer: Medicare HMO | Admitting: Radiation Oncology

## 2022-09-24 ENCOUNTER — Other Ambulatory Visit: Payer: Self-pay

## 2022-09-24 VITALS — BP 144/85 | HR 83 | Temp 96.2°F | Resp 18 | Ht 62.0 in | Wt 123.0 lb

## 2022-09-24 DIAGNOSIS — Z170 Estrogen receptor positive status [ER+]: Secondary | ICD-10-CM | POA: Diagnosis not present

## 2022-09-24 DIAGNOSIS — C50412 Malignant neoplasm of upper-outer quadrant of left female breast: Secondary | ICD-10-CM

## 2022-09-24 NOTE — Progress Notes (Signed)
Radiation Oncology         [phone] ________________________________  Outpatient Follow Up - Conducted via telephone at patient request.  I spoke with the patient to conduct this consult visit via telephone. The patient was notified in advance and was offered an in person or telemedicine meeting to allow for face to face communication but instead preferred to proceed with a telephone visit.  Name: Veronica Robles        MRN: [id number]  Date of Service: 09/24/2022 DOB: 1956-06-21  CC:[name], MD  Nicholas Lose, MD     REFERRING PHYSICIAN: Nicholas Lose, MD   DIAGNOSIS: The encounter diagnosis was Malignant neoplasm of upper-outer quadrant of left breast in female, estrogen receptor positive ([place]).   HISTORY OF PRESENT ILLNESS: Veronica Robles is a 67 y.o. female originally seen in the multidisciplinary breast clinic for a new diagnosis of left breast cancer. The patient was noted to have a possible mass in the left breast and left axillary adenopathy she underwent further diagnostic work-up which showed a mass in the 3 o'clock position of the left breast measuring 9 mm in greatest dimension and between the mass and the nipple there were numerous dilated ducts, asymmetric changes compared to other quadrants of the breast and the duct appeared to terminate near the spiculated mass however no intraductal mass was noted.  Within the left axilla there were 4 abnormal appearing lymph nodes the largest had cortical thickening of 7 mm.  She underwent a biopsy on 02/28/2022 of the breast and a axillary lymph node.  Within the breast invasive lobular carcinoma was seen with an overall grade of tumor.  The tumor was ER positive, PR negative, HER2 negative with a Ki-67 of 10%, and her left axillary lymph node was involved with metastatic carcinoma.     Since her last visit, the patient has undergone CT and bone scan staging.  Her CT scan of the chest abdomen and pelvis showed left axillary  adenopathy, evidence of atherosclerotic and renal disease, and her bone scan was negative for uptake.  An MRI of the breast on 04/02/2022 showed her known left breast cancer, 9 mm of enhancement anterior and lateral to the biopsy site were noted, and bilateral benign ductal ectasia was identified, and no abnormal nodes were seen by MRI. She begin chemotherapy on 03/27/22 and completed this on 08/08/22.  Posttreatment imaging on 08/09/2022 with MRI showed interval improvement in the known left breast cancer measuring 1.1 cm, 1 abnormal lymph node was seen in the high left axilla.  No other new findings were appreciated.  She underwent a left lumpectomy with targeted lymph node dissection on 08/27/2022 with Dr. Ninfa Linden.  Final pathology showed a grade 2 invasive ductal carcinoma that measured 1.9 cm.  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  She had 4 sampled lymph nodes, 3 of which had metastatic disease.  She is seen today to discuss treatment recommendations of adjuvant radiotherapy to the left breast and regional nodes.   PREVIOUS RADIATION THERAPY: No   PAST MEDICAL HISTORY:  Past Medical History:  Diagnosis Date   Breast cancer ([place]) 02/2022   left breast IDC with DCIS   Family history of breast cancer 03/20/2022   Family history of pancreatic cancer 03/20/2022   HIV infection ([place]) 1993   Hypertension        PAST SURGICAL HISTORY: Past Surgical History:  Procedure Laterality Date   ABDOMINAL HYSTERECTOMY  APPENDECTOMY     BREAST BIOPSY Right 2015   BREAST BIOPSY Left 08/26/2022   [place] LT RADIOACTIVE SEED LOC 08/26/2022 [facility]MO[facility]AP[facility]   BREAST BIOPSY  08/26/2022   MM LT RADIOACTIVE SEED LOC MAMMO GUIDE 08/26/2022 [facility]AMMOGRAP[facility]   BREAST LUMPECTOMY WITH RADIOACTIVE SEED LOCALIZATION Left 08/27/2022   Procedure: LEFT BREAST LUMPECTOMY WITH RADIOACTIVE SEED LOCALIZATION;  Surgeon: [name], MD;  Location: [facility];  Service: General;   Laterality: Left;   CYSTOSCOPY/RETROGRADE/URETEROSCOPY Right 11/14/2016   Procedure: CYSTOSCOPY/RETROGRADE/URETEROSCOPY/ STONE EXTRACTION/HOLMIUM LASER/STENT PLACEMENT;  Surgeon: Kathie Rhodes, MD;  Location: [facility];  Service: Urology;  Laterality: Right;   PORT-A-CATH REMOVAL Right 08/27/2022   Procedure: REMOVAL PORT-A-CATH;  Surgeon: [name], MD;  Location: [facility];  Service: General;  Laterality: Right;   PORTACATH PLACEMENT Right 03/26/2022   Procedure: INSERTION PORT-A-CATH;  Surgeon: [name], MD;  Location: [facility];  Service: General;  Laterality: Right;   RADIOACTIVE SEED GUIDED AXILLARY SENTINEL LYMPH NODE Left 08/27/2022   Procedure: RADIOACTIVE SEED GUIDED LEFT AXILLARY SENTINEL LYMPH NODE DISSECTION;  Surgeon: [name], MD;  Location: [facility];  Service: General;  Laterality: Left;     FAMILY HISTORY:  Family History  Problem Relation Age of Onset   Hypertension Mother    Pancreatic cancer Father 35   Breast cancer Sister        dx 34s   Lung cancer Sister        d. 36     SOCIAL HISTORY:  reports that she has quit smoking. Her smoking use included cigarettes. She smoked an average of .1 packs per day. She has never used smokeless tobacco. She reports that she does not drink alcohol and does not use drugs.  The patient is single and lives in [place]. She enjoys spending time with her teenage grandchildren.    ALLERGIES: Penicillins   MEDICATIONS:  Current Outpatient Medications  Medication Sig Dispense Refill   Blood Pressure Monitoring (ADULT BLOOD PRESSURE CUFF LG) KIT       emtricitabine-tenofovir AF (DESCOVY) 200-25 MG tablet Take 1 tablet by mouth daily. 30 tablet 5   fostemsavir tromethamine (RUKOBIA) 600 MG TB12 ER tablet Take 1 tablet (600 mg total) by mouth 2 (two) times daily. 60 tablet 5   hydrochlorothiazide (HYDRODIURIL) 25 MG tablet Take 1 tablet (25 mg total) by mouth daily.  90 tablet 1   lidocaine-prilocaine (EMLA) cream Apply to affected area once 30 g 3   ondansetron (ZOFRAN) 8 MG tablet Take 1 tablet (8 mg) by mouth every 8 hours as needed for nausea/vomiting. Start third day after doxorubicin/cyclophosphamide chemotherapy. 30 tablet 1   prochlorperazine (COMPAZINE) 10 MG tablet Take 1 tablet (10 mg total) by mouth every 6 (six) hours as needed for nausea or vomiting. 30 tablet 1   SQ injection lenacapavir (SUNLENCA) 463.5 MG/1.5ML SQ injection Inject 3 mLs (927 mg total) into the skin every 6 (six) months. Administer each injection subcutaneously at separate sites in the abdomen (more or equal to 2 inches from the navel). 3 mL 2   sulfamethoxazole-trimethoprim (BACTRIM) 200-40 MG/5ML suspension Take 20 mLs by mouth daily. 473 mL 5   No current facility-administered medications for this encounter.   Facility-Administered Medications Ordered in Other Encounters  Medication Dose Route Frequency Provider Last Rate Last Admin   ciprofloxacin (CIPRO) IVPB 400 mg  400 mg Intravenous Q12H Kathie Rhodes, MD   400 mg at 03/26/22 [phone]  REVIEW OF SYSTEMS: On review of systems, the patient reports that she is doing well. She's been pleased with how she's healed and has good range of motion. She's starting to have hair growth on her scalp.      PHYSICAL EXAM:  Wt Readings from Last 3 Encounters:  09/24/22 123 lb (55.8 kg)  09/23/22 123 lb (55.8 kg)  09/10/22 123 lb 9.6 oz (56.1 kg)   Temp Readings from Last 3 Encounters:  09/24/22 (!) 96.2 F (35.7 C) (Temporal)  09/23/22 (!) 96.2 F (35.7 C) (Temporal)  09/10/22 (!) 97.3 F (36.3 C) (Temporal)   BP Readings from Last 3 Encounters:  09/24/22 (!) 144/85  09/23/22 (!) 144/85  09/10/22 128/68   Pulse Readings from Last 3 Encounters:  09/24/22 83  09/23/22 83  09/10/22 78   In general this is a well appearing African American female in no acute distress. She's alert and oriented x4 and appropriate  throughout the examination. Cardiopulmonary assessment is negative for acute distress and she exhibits normal effort. The right chest wall PAC incision site is healing well since this was removed. Her left breast and axillary incision sites are well healed without erythema, separation, or drainage. No fluctuance is noted.      ECOG = 1  0 - Asymptomatic (Fully active, able to carry on all predisease activities without restriction)  1 - Symptomatic but completely ambulatory (Restricted in physically strenuous activity but ambulatory and able to carry out work of a light or sedentary nature. For example, light housework, office work)  2 - Symptomatic, <50% in bed during the day (Ambulatory and capable of all self care but unable to carry out any work activities. Up and about more than 50% of waking hours)  3 - Symptomatic, >50% in bed, but not bedbound (Capable of only limited self-care, confined to bed or chair 50% or more of waking hours)  4 - Bedbound (Completely disabled. Cannot carry on any self-care. Totally confined to bed or chair)  5 - Death   Eustace Pen MM, Creech RH, Tormey DC, et al. [phone]). "Toxicity and response criteria of the Minim[facility] In[employer] [place] [employer]". [place] Oncol. 5 (6): 649-55    LABORATORY DATA:  Lab Results  Component Value Date   WBC 2.9 (L) 08/08/2022   HGB 10.1 (L) 08/08/2022   HCT 30.2 (L) 08/08/2022   MCV 94.4 08/08/2022   PLT 156 08/08/2022   Lab Results  Component Value Date   NA 140 08/08/2022   K 3.9 08/08/2022   CL 108 08/08/2022   CO2 29 08/08/2022   Lab Results  Component Value Date   ALT 25 08/08/2022   AST 21 08/08/2022   ALKPHOS 58 08/08/2022   BILITOT 0.3 08/08/2022      RADIOGRAPHY: MM Breast Surgical Specimen  Result Date: 08/27/2022 CLINICAL DATA:  Evaluate 2 specimens EXAM: SPECIMEN RADIOGRAPH OF THE LEFT BREAST COMPARISON:  Previous exam(s). FINDINGS: Status post excision of the left breast. The radioactive seeds and  biopsy marker clips are present, completely intact, and were marked for pathology. IMPRESSION: Specimen radiograph of the left breast. Electronically Signed   By: Dorise Bullion III M.D.   On: 08/27/2022 09:53  MM Breast Surgical Specimen  Result Date: 08/27/2022 CLINICAL DATA:  Evaluate 2 specimens EXAM: SPECIMEN RADIOGRAPH OF THE LEFT BREAST COMPARISON:  Previous exam(s). FINDINGS: Status post excision of the left breast. The radioactive seeds and biopsy marker clips are present, completely intact, and were marked for pathology. IMPRESSION:  Specimen radiograph of the left breast. Electronically Signed   By: Dorise Bullion III M.D.   On: 08/27/2022 09:53  MM LT RADIOACTIVE SEED LOC MAMMO GUIDE  Result Date: 08/26/2022 CLINICAL DATA:  67 year old female with history of left breast cancer presenting for seed localization. EXAM: MAMMOGRAPHIC GUIDED RADIOACTIVE SEED LOCALIZATION OF THE LEFT BREAST COMPARISON:  Previous exam(s). FINDINGS: Patient presents for radioactive seed localization prior to left breast lumpectomy. I met with the patient and we discussed the procedure of seed localization including benefits and alternatives. We discussed the high likelihood of a successful procedure. We discussed the risks of the procedure including infection, bleeding, tissue injury and further surgery. We discussed the low dose of radioactivity involved in the procedure. Informed, written consent was given. The usual time-out protocol was performed immediately prior to the procedure. Using mammographic guidance, sterile technique, 1% lidocaine and an I-125 radioactive seed, the ribbon shaped biopsy marking clip in the left breast was localized using a lateral approach. The follow-up mammogram images confirm the seed in the expected location and were marked for Dr. Ninfa Linden. Follow-up survey of the patient confirms presence of the radioactive seed. Order number of I-125 seed:  [id number]. Total activity: 0.242 mCi  reference Date: July 16, 2022 The patient tolerated the procedure well and was released from the [facility]. She was given instructions regarding seed removal. IMPRESSION: Radioactive seed localization left breast. No apparent complications. Electronically Signed   By: Audie Pinto M.D.   On: 08/26/2022 13:11  [place] LT RADIOACTIVE SEED LOC  Result Date: 08/26/2022 CLINICAL DATA:  67 year old female presenting for seed localization of the left axilla. EXAM: ULTRASOUND GUIDED RADIOACTIVE SEED LOCALIZATION OF THE LEFT AXILLA COMPARISON:  Previous exam(s). FINDINGS: Patient presents for radioactive seed localization prior to left axillary dissection. I met with the patient and we discussed the procedure of seed localization including benefits and alternatives. We discussed the high likelihood of a successful procedure. We discussed the risks of the procedure including infection, bleeding, tissue injury and further surgery. We discussed the low dose of radioactivity involved in the procedure. Informed, written consent was given. The usual time-out protocol was performed immediately prior to the procedure. Using ultrasound guidance, sterile technique, 1% lidocaine and an I-125 radioactive seed, the biopsy lymph node in the left axilla was localized using a lateral approach. The follow-up mammogram images confirm the seed in the expected location and were marked for Dr. Ninfa Linden. Follow-up survey of the patient confirms presence of the radioactive seed. Order number of I-125 seed:  [id number]. Total activity: 0.242 mCi reference Date: July 16, 2022 The patient tolerated the procedure well and was released from the [facility]. She was given instructions regarding seed removal. IMPRESSION: Radioactive seed localization left axilla. No apparent complications. Electronically Signed   By: Audie Pinto M.D.   On: 08/26/2022 13:09  MM CLIP PLACEMENT LEFT  Result Date: 08/26/2022 CLINICAL DATA:  Post  procedure mammogram for radioactive seed placement. EXAM: 3D DIAGNOSTIC LEFT MAMMOGRAM POST RADIOACTIVE SEED PLACEMENT COMPARISON:  Previous exam(s). FINDINGS: 3D Mammographic images were obtained following seed placement in the left axilla. The radioactive seed appears appropriately placed in the biopsy lymph node. IMPRESSION: Appropriate positioning of the radioactive seed in the left axilla. Final Assessment: Post Procedure Mammograms for Marker Placement Electronically Signed   By: Audie Pinto M.D.   On: 08/26/2022 13:08      IMPRESSION/PLAN: 1. Stage IIA, [id number], grade 3, ER positive invasive lobular carcinoma of the left breast  with [id number] residual disease following neoadjuvant chemotherapy. Dr. Lisbeth Renshaw has reviewed her final pathology and course to date.  He recommends external radiotherapy to the breast and regional lymph nodes to reduce risks of local recurrence followed by antiestrogen therapy. We discussed the risks, benefits, short, and long term effects of radiotherapy, as well as the curative intent, and the patient is interested in proceeding. I reviewed the delivery and logistics of radiotherapy and that Dr. Lisbeth Renshaw recommends 6 1/2 weeks of radiotherapy to the left breast and regional nodes with deep inspiration breath-hold technique. Written consent is obtained and placed in the chart, a copy was provided to the patient. She will simulate this morning.     In a visit lasting 60 minutes, greater than 50% of the time was spent face to face discussing the patient's condition, in preparation for the discussion, and coordinating the patient's care.       Carola Rhine, [facility]    **Disclaimer: This note was dictated with voice recognition software. Similar sounding words can inadvertently be transcribed and this note may contain transcription errors which may not have been corrected upon publication of note.**

## 2022-09-25 ENCOUNTER — Other Ambulatory Visit (HOSPITAL_COMMUNITY): Payer: Self-pay

## 2022-09-25 ENCOUNTER — Encounter: Payer: Self-pay | Admitting: Rehabilitation

## 2022-09-25 ENCOUNTER — Ambulatory Visit: Payer: Medicare HMO | Admitting: Rehabilitation

## 2022-09-25 DIAGNOSIS — R293 Abnormal posture: Secondary | ICD-10-CM

## 2022-09-25 DIAGNOSIS — Z170 Estrogen receptor positive status [ER+]: Secondary | ICD-10-CM

## 2022-09-25 DIAGNOSIS — Z483 Aftercare following surgery for neoplasm: Secondary | ICD-10-CM

## 2022-09-25 DIAGNOSIS — M25612 Stiffness of left shoulder, not elsewhere classified: Secondary | ICD-10-CM

## 2022-09-25 DIAGNOSIS — C50412 Malignant neoplasm of upper-outer quadrant of left female breast: Secondary | ICD-10-CM | POA: Diagnosis not present

## 2022-09-25 NOTE — Addendum Note (Signed)
Encounter addended by: Hayden Pedro, PA-C on: 09/25/2022 9:36 AM  Actions taken: Clinical Note Signed

## 2022-09-25 NOTE — Therapy (Signed)
OUTPATIENT PHYSICAL THERAPY BREAST CANCER TREATMENT   Patient Name: Veronica Robles MRN: [id number] DOB:1955/08/13, 67 y.o., female Today's Date: 09/25/2022  END OF SESSION:  PT End of Session - 09/25/22 1502     Visit Number 4    Number of Visits 8    PT Start Time 1505    PT Stop Time [id number]    PT Time Calculation (min) 43 min    Activity Tolerance Patient tolerated treatment well    Behavior During Therapy [facility] for tasks assessed/performed             Past Medical History:  Diagnosis Date   Breast cancer ([place]) 02/2022   left breast IDC with DCIS   Family history of breast cancer 03/20/2022   Family history of pancreatic cancer 03/20/2022   HIV infection ([place]) 1993   Hypertension    Past Surgical History:  Procedure Laterality Date   ABDOMINAL HYSTERECTOMY     APPENDECTOMY     BREAST BIOPSY Right 2015   BREAST BIOPSY Left 08/26/2022   [place] LT RADIOACTIVE SEED LOC 08/26/2022 [facility]   BREAST BIOPSY  08/26/2022   MM LT RADIOACTIVE SEED LOC MAMMO GUIDE 08/26/2022 [facility]   BREAST LUMPECTOMY WITH RADIOACTIVE SEED LOCALIZATION Left 08/27/2022   Procedure: LEFT BREAST LUMPECTOMY WITH RADIOACTIVE SEED LOCALIZATION;  Surgeon: [name], MD;  Location: [place];  Service: General;  Laterality: Left;   CYSTOSCOPY/RETROGRADE/URETEROSCOPY Right 11/14/2016   Procedure: CYSTOSCOPY/RETROGRADE/URETEROSCOPY/ STONE EXTRACTION/HOLMIUM LASER/STENT PLACEMENT;  Surgeon: Kathie Rhodes, MD;  [facility];  Service: Urology;  Laterality: Right;   PORT-A-CATH REMOVAL Right 08/27/2022   Procedure: REMOVAL PORT-A-CATH;  Surgeon: [name], MD;  Location: [place];  Service: General;  Laterality: Right;   PORTACATH PLACEMENT Right 03/26/2022   Procedure: INSERTION PORT-A-CATH;  Surgeon: [name], MD;  Location: [place];  Service: General;  Laterality: Right;   RADIOACTIVE SEED GUIDED AXILLARY SENTINEL LYMPH  NODE Left 08/27/2022   Procedure: RADIOACTIVE SEED GUIDED LEFT AXILLARY SENTINEL LYMPH NODE DISSECTION;  Surgeon: [name], MD;  Location: [place];  Service: General;  Laterality: Left;   Patient Active Problem List   Diagnosis Date Noted   Port-A-Cath in place 08/08/2022   Family history of breast cancer 03/20/2022   Family history of pancreatic cancer 03/20/2022   Gastroesophageal reflux disease without esophagitis 03/12/2022   Weight loss 03/12/2022   Malignant neoplasm of upper-outer quadrant of left breast in female, estrogen receptor positive ([place]) 03/07/2022   Dental caries 12/01/2021   Other viral warts 12/01/2021   Healthcare maintenance 11/09/2018   VISUAL IMPAIRMENT 10/01/2007   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 Ninfa Linden  REFERRING DIAG: Left breast cancer  THERAPY DIAG:  Malignant neoplasm of upper-outer quadrant of left breast in female, estrogen receptor positive ([place])  Abnormal posture  Aftercare following surgery for neoplasm  Stiffness of left shoulder, not elsewhere classified  Rationale for Evaluation and Treatment: Rehabilitation  ONSET DATE: 03/19/22  SUBJECTIVE:  SUBJECTIVE STATEMENT: I am feeling a lot better.    PERTINENT HISTORY:  Post left lumpectomy 08/27/22.  3 of 4 lymph nodes positive. Neoadjuvant chemotherapy completed and will be having radiation. It is ER positive, PR negative, and HER2 negative with a Ki67 of 10%. She has a positive axillary node. She also has HIV disease which she has not shared with her family.    PATIENT GOALS:  Reassess how my recovery is going related to arm function, pain, and swelling.  PAIN:  Are you having pain? No   PRECAUTIONS: Recent Surgery, left UE Lymphedema risk, HIV  ACTIVITY LEVEL / LEISURE: Retired.  Getting the seat belt is hard.     OBJECTIVE:   PATIENT SURVEYS:  QUICK DASH: 43% from 0%   OBSERVATIONS: Very guarded.  Does not want to touch operated side. Well healed incisions. Still wearing compression.   POSTURE:  Forward shoulder, thoracic kyphosis   LYMPHEDEMA ASSESSMENT:   UPPER EXTREMITY AROM/PROM:   A/PROM RIGHT   eval    Shoulder extension 50  Shoulder flexion 136  Shoulder abduction 149  Shoulder internal rotation 64  Shoulder external rotation 73                          (Blank rows = not tested)   A/PROM LEFT   eval 09/17/22 09/25/22  Shoulder extension 54 60   Shoulder flexion 137 90 - tight in axilla  140  Shoulder abduction 151 85 - tight in axilla  132  Shoulder internal rotation 59    Shoulder external rotation 83 83                           (Blank rows = not tested)    UPPER EXTREMITY STRENGTH: WNL    LYMPHEDEMA ASSESSMENTS:    LANDMARK RIGHT   eval  10 cm proximal to olecranon process 25.7  Olecranon process 22.1  10 cm proximal to ulnar styloid process 19.6  Just proximal to ulnar styloid process 14.7  Across hand at thumb web space 17.6  At base of 2nd digit 6.2  (Blank rows = not tested)   LANDMARK LEFT   eval LEFT 09/22/2022  10 cm proximal to olecranon process 25.2 24.5  Olecranon process 21.5 21.4  10 cm proximal to ulnar styloid process 18.8 18.5  Just proximal to ulnar styloid process 14.7 14.5  Across hand at thumb web space 17.2 18.0  At base of 2nd digit 6 6.0  (Blank rows = not tested)  TODAY"S TREATMENT: 09/25/2022 Pulleys for flexion, scaption and abd x 15mn ea Ball rolls on wall x 5 ea forward Standing abduction dowel in front of the mirror x 5 Supine wand flexion x 5 PROM left  shoulder flexion, scaption, abd, IR and ER MFR to left axillary cording with arm on 2nd pillow, repeated elbow extension  09/22/2022 Measured circumference of left UE; no sign of swelling Pulleys for flexion, scaption and abd x 1:30 ea Ball rolls on wall x 5 ea forward and abd. Standing lat stretch at bar x 3 Supine wand flexion and scaption x 3 PROM left shoulder flexion, scaption, abd, IR and ER Foam pad made for axillary border of bra MFR to left axillary cording with arm on 2nd pillow, repeated elbow extension  09/17/22: Brief eval performed - switched to treatment due to fear of movement and cording noted.  Supine dowel flexion:  Pt only able to perform dowel chest press due to fear and then after 5 chest presses she was able to try flexion. Vcs to keep elbows straight.   Supine chest stretch: elbows in and then out as tolerated x 5 Wall flexion x 2 Wall abduction x 2 Pulleys into flexion x 81mn as tolerated.    PATIENT EDUCATION:  Education details: HEP to focus on per below, cording, importance of touch and cortical mapping etc to improve function Person educated: Patient Education method: Explanation, Demonstration, Tactile cues, Verbal cues, and Handouts Education comprehension: verbalized understanding and needs further education  HOME EXERCISE PROGRAM: Reviewed previously given post op HEP: updated to supine dowel flexion, supine chest stretch, added wall flexion Gave pulley handout  ASSESSMENT:  CLINICAL IMPRESSION:  Pt demonstrates very good improvements in AROM.  She is almost back to baseline except for abduction.  Pt is happy about her progress.   Pt will benefit from skilled therapeutic intervention to improve on the following deficits: Decreased knowledge of precautions, impaired UE functional use, pain, decreased ROM, postural dysfunction.   PT treatment/interventions: ADL/Self care home management, Therapeutic exercises, Therapeutic activity, Neuromuscular  re-education, Patient/Family education, Self Care, DME instructions, Manual therapy, and Re-evaluation   GOALS: Goals reviewed with patient? Yes  LONG TERM GOALS:  (STG=LTG)  GOALS Name Target Date  Goal status  1 Pt will demonstrate she has regained full shoulder ROM and function post operatively compared to baselines.  Baseline: 09/12/22 INITIAL  2 Pt will be educated on lymphedema risk reduction or be signed up for ABC class 09/12/22 INITIAL  3 Pt will be able to tolerate radiation position  09/12/22 initial  4 Pt will decrease DQASH to 10% or less to demonstrate improved function 09/12/22 initial     PLAN:  PT FREQUENCY/DURATION: scheduled for 2x to start and then we will add more as needed up to 2x per week x 4 weeks  PLAN FOR NEXT SESSION: left shoulder AAROM/PROM, cording work PRN, (need to finish post op education/eval including circumferences, lymphedema education, scar education, SOZO, etc.) Did not give out post op education sheet.    [facility]  [address]  [id number] [phone] After Breast Cancer Class It is recommended you attend the ABC class to be educated on lymphedema risk reduction. This class is free of charge and lasts for 1 hour. It is a 1-time class. You will need to download the Webex app either on your phone or computer. We will send you a link the night before or the morning of the class. You should be able to click on that link to join the class. This is not a confidential class. You don't have to turn your camera on, but other participants may be able to see your email address.  Scar massage You can begin gentle scar massage to you incision sites. Gently place one hand on the incision and move the skin (without sliding on the skin) in various directions. Do this for a few minutes and then you can gently massage either coconut oil or vitamin E cream into the scars.  Compression garment You should continue  wearing your compression bra until you feel like you no longer have swelling.  Home exercise Program Continue doing the exercises you were given until you feel like you can do them without feeling any tightness at the end.   Walking Program Studies show that 30 minutes of walking per day (fast  enough to elevate your heart rate) can significantly reduce the risk of a cancer recurrence. If you can't walk due to other medical reasons, we encourage you to find another activity you could do (like a stationary bike or water exercise).  Posture After breast cancer surgery, people frequently sit with rounded shoulders posture because it puts their incisions on slack and feels better. If you sit like this and scar tissue forms in that position, you can become very tight and have pain sitting or standing with good posture. Try to be aware of your posture and sit and stand up tall to heal properly.  Follow up PT: It is recommended you return every 3 months for the first 3 years following surgery to be assessed on the SOZO machine for an L-Dex score. This helps prevent clinically significant lymphedema in 95% of patients. These follow up screens are 10 minute appointments that you are not billed for.  Stark Bray, PT 09/25/2022, 3:51 PM

## 2022-09-26 ENCOUNTER — Other Ambulatory Visit: Payer: Self-pay

## 2022-09-30 ENCOUNTER — Other Ambulatory Visit (HOSPITAL_COMMUNITY): Payer: Self-pay

## 2022-10-01 DIAGNOSIS — Z483 Aftercare following surgery for neoplasm: Secondary | ICD-10-CM | POA: Diagnosis not present

## 2022-10-01 DIAGNOSIS — C773 Secondary and unspecified malignant neoplasm of axilla and upper limb lymph nodes: Secondary | ICD-10-CM | POA: Insufficient documentation

## 2022-10-01 DIAGNOSIS — Z170 Estrogen receptor positive status [ER+]: Secondary | ICD-10-CM | POA: Insufficient documentation

## 2022-10-01 DIAGNOSIS — M25612 Stiffness of left shoulder, not elsewhere classified: Secondary | ICD-10-CM | POA: Diagnosis not present

## 2022-10-01 DIAGNOSIS — Z510 Encounter for antineoplastic radiation therapy: Secondary | ICD-10-CM | POA: Insufficient documentation

## 2022-10-01 DIAGNOSIS — C50412 Malignant neoplasm of upper-outer quadrant of left female breast: Secondary | ICD-10-CM | POA: Diagnosis present

## 2022-10-01 DIAGNOSIS — R293 Abnormal posture: Secondary | ICD-10-CM | POA: Insufficient documentation

## 2022-10-02 ENCOUNTER — Encounter: Payer: Self-pay | Admitting: Rehabilitation

## 2022-10-02 ENCOUNTER — Ambulatory Visit
Admission: RE | Admit: 2022-10-02 | Discharge: 2022-10-02 | Disposition: A | Discharge status: Home / Self Care | Payer: Medicare HMO | Source: Ambulatory Visit | Attending: Radiation Oncology | Admitting: Radiation Oncology

## 2022-10-02 ENCOUNTER — Telehealth: Payer: Self-pay

## 2022-10-02 ENCOUNTER — Ambulatory Visit: Payer: Medicare HMO | Attending: Surgery | Admitting: Rehabilitation

## 2022-10-02 ENCOUNTER — Other Ambulatory Visit: Payer: Self-pay

## 2022-10-02 DIAGNOSIS — Z510 Encounter for antineoplastic radiation therapy: Secondary | ICD-10-CM | POA: Diagnosis not present

## 2022-10-02 DIAGNOSIS — Z483 Aftercare following surgery for neoplasm: Secondary | ICD-10-CM | POA: Insufficient documentation

## 2022-10-02 DIAGNOSIS — Z170 Estrogen receptor positive status [ER+]: Secondary | ICD-10-CM | POA: Insufficient documentation

## 2022-10-02 DIAGNOSIS — R293 Abnormal posture: Secondary | ICD-10-CM | POA: Insufficient documentation

## 2022-10-02 DIAGNOSIS — C50412 Malignant neoplasm of upper-outer quadrant of left female breast: Secondary | ICD-10-CM | POA: Insufficient documentation

## 2022-10-02 DIAGNOSIS — M25612 Stiffness of left shoulder, not elsewhere classified: Secondary | ICD-10-CM | POA: Insufficient documentation

## 2022-10-02 LAB — RAD ONC ARIA SESSION SUMMARY

## 2022-10-02 NOTE — Therapy (Signed)
OUTPATIENT PHYSICAL THERAPY BREAST CANCER TREATMENT   Patient Name: Veronica Robles MRN: [id number] DOB:03-21-1956, 67 y.o., female Today's Date: 10/02/2022  END OF SESSION:  PT End of Session - 10/02/22 1559     Visit Number 5    Number of Visits 8    PT Start Time 1600    PT Stop Time [id number]    PT Time Calculation (min) 47 min    Activity Tolerance Patient tolerated treatment well    Behavior During Therapy [facility] for tasks assessed/performed             Past Medical History:  Diagnosis Date   Breast cancer ([place]) 02/2022   left breast IDC with DCIS   Family history of breast cancer 03/20/2022   Family history of pancreatic cancer 03/20/2022   HIV infection ([place]) 1993   Hypertension    Past Surgical History:  Procedure Laterality Date   ABDOMINAL HYSTERECTOMY     APPENDECTOMY     BREAST BIOPSY Right 2015   BREAST BIOPSY Left 08/26/2022   [place] LT RADIOACTIVE SEED LOC 08/26/2022 [facility]   BREAST BIOPSY  08/26/2022   MM LT RADIOACTIVE SEED LOC MAMMO GUIDE 08/26/2022 [facility]   BREAST LUMPECTOMY WITH RADIOACTIVE SEED LOCALIZATION Left 08/27/2022   Procedure: LEFT BREAST LUMPECTOMY WITH RADIOACTIVE SEED LOCALIZATION;  Surgeon: [name], MD;  Location: [place] ;  Service: General;  Laterality: Left;   CYSTOSCOPY/RETROGRADE/URETEROSCOPY Right 11/14/2016   Procedure: CYSTOSCOPY/RETROGRADE/URETEROSCOPY/ STONE EXTRACTION/HOLMIUM LASER/STENT PLACEMENT;  Surgeon: Kathie Rhodes, MD;  [facility];  Service: Urology;  Laterality: Right;   PORT-A-CATH REMOVAL Right 08/27/2022   Procedure: REMOVAL PORT-A-CATH;  Surgeon: [name], MD;  Location: [place];  Service: General;  Laterality: Right;   PORTACATH PLACEMENT Right 03/26/2022   Procedure: INSERTION PORT-A-CATH;  Surgeon: [name], MD;  Location: [place];  Service: General;  Laterality: Right;   RADIOACTIVE SEED GUIDED AXILLARY SENTINEL LYMPH  NODE Left 08/27/2022   Procedure: RADIOACTIVE SEED GUIDED LEFT AXILLARY SENTINEL LYMPH NODE DISSECTION;  Surgeon: [name], MD;  Location: [place];  Service: General;  Laterality: Left;   Patient Active Problem List   Diagnosis Date Noted   Port-A-Cath in place 08/08/2022   Family history of breast cancer 03/20/2022   Family history of pancreatic cancer 03/20/2022   Gastroesophageal reflux disease without esophagitis 03/12/2022   Weight loss 03/12/2022   Malignant neoplasm of upper-outer quadrant of left breast in female, estrogen receptor positive ([place]) 03/07/2022   Dental caries 12/01/2021   Other viral warts 12/01/2021   Healthcare maintenance 11/09/2018   VISUAL IMPAIRMENT 10/01/2007   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 Ninfa Linden  REFERRING DIAG: Left breast cancer  THERAPY DIAG:  Malignant neoplasm of upper-outer quadrant of left breast in female, estrogen receptor positive ([place])  Abnormal posture  Aftercare following surgery for neoplasm  Stiffness of left shoulder, not elsewhere classified  Rationale for Evaluation and Treatment: Rehabilitation  ONSET DATE: 03/19/22  SUBJECTIVE:  SUBJECTIVE STATEMENT: It did okay.  Reaching up is still hard.   PERTINENT HISTORY:  Post left lumpectomy 08/27/22.  3 of 4 lymph nodes positive. Neoadjuvant chemotherapy completed and will be having radiation. It is ER positive, PR negative, and HER2 negative with a Ki67 of 10%. She has a positive axillary node. She also has HIV disease which she has not shared with her  family.   PATIENT GOALS:  Reassess how my recovery is going related to arm function, pain, and swelling.  PAIN:  Are you having pain? No   PRECAUTIONS: Recent Surgery, left UE Lymphedema risk, HIV  ACTIVITY LEVEL / LEISURE: Retired.  Getting the seat belt is hard.     OBJECTIVE:   PATIENT SURVEYS:  QUICK DASH: 43% from 0%   OBSERVATIONS: Very guarded.  Does not want to touch operated side. Well healed incisions. Still wearing compression.   POSTURE:  Forward shoulder, thoracic kyphosis   LYMPHEDEMA ASSESSMENT:   UPPER EXTREMITY AROM/PROM:   A/PROM RIGHT   eval    Shoulder extension 50  Shoulder flexion 136  Shoulder abduction 149  Shoulder internal rotation 64  Shoulder external rotation 73                          (Blank rows = not tested)   A/PROM LEFT   eval 09/17/22 09/25/22  Shoulder extension 54 60   Shoulder flexion 137 90 - tight in axilla  140  Shoulder abduction 151 85 - tight in axilla  132  Shoulder internal rotation 59    Shoulder external rotation 83 83                           (Blank rows = not tested)    UPPER EXTREMITY STRENGTH: WNL    LYMPHEDEMA ASSESSMENTS:    LANDMARK RIGHT   eval  10 cm proximal to olecranon process 25.7  Olecranon process 22.1  10 cm proximal to ulnar styloid process 19.6  Just proximal to ulnar styloid process 14.7  Across hand at thumb web space 17.6  At base of 2nd digit 6.2  (Blank rows = not tested)   LANDMARK LEFT   eval LEFT 09/22/2022  10 cm proximal to olecranon process 25.2 24.5  Olecranon process 21.5 21.4  10 cm proximal to ulnar styloid process 18.8 18.5  Just proximal to ulnar styloid process 14.7 14.5  Across hand at thumb web space 17.2 18.0  At base of 2nd digit 6 6.0  (Blank rows = not tested)  TODAY"S TREATMENT: Pt permission and consent throughout each step of examination and treatment with modification and draping if requested when working on sensitive areas  10/02/2022 Pulleys for  flexion, scaption and abd x 52mn ea Ball rolls on wall x 5 ea forward and abduction Supine wand flexion x 5 Snow angel x 5 Alternating shoulder flexion x 5 each Standing bil row yellow x 10  PROM left shoulder flexion, scaption, abd, IR and ER MFR to left axillary cording with arm   09/25/2022 Pulleys for flexion, scaption and abd x 246m ea Ball rolls on wall x 5 ea forward Standing abduction dowel in front of the mirror x 5 Supine wand flexion x 5 PROM left shoulder flexion, scaption, abd, IR and ER MFR to left axillary cording with arm on 2nd pillow, repeated elbow extension  09/22/2022 Measured circumference of left UE; no sign of swelling Pulleys  for flexion, scaption and abd x 1:30 ea Ball rolls on wall x 5 ea forward and abd. Standing lat stretch at bar x 3 Supine wand flexion and scaption x 3 PROM left shoulder flexion, scaption, abd, IR and ER Foam pad made for axillary border of bra MFR to left axillary cording with arm on 2nd pillow, repeated elbow extension  09/17/22: Brief eval performed - switched to treatment due to fear of movement and cording noted.  Supine dowel flexion:   Pt only able to perform dowel chest press due to fear and then after 5 chest presses she was able to try flexion. Vcs to keep elbows straight.   Supine chest stretch: elbows in and then out as tolerated x 5 Wall flexion x 2 Wall abduction x 2 Pulleys into flexion x 85mn as tolerated.    PATIENT EDUCATION:  Education details: HEP to focus on per below, cording, importance of touch and cortical mapping etc to improve function Person educated: Patient Education method: Explanation, Demonstration, Tactile cues, Verbal cues, and Handouts Education comprehension: verbalized understanding and needs further education  HOME EXERCISE PROGRAM: Reviewed previously given post op HEP: updated to supine dowel flexion, supine chest stretch, added wall flexion Gave pulley handout  ASSESSMENT:  CLINICAL  IMPRESSION: Pt demonstrates very good improvements in AROM.  She still has trouble relaxing but does better after repetition and with cueing.   Pt will benefit from skilled therapeutic intervention to improve on the following deficits: Decreased knowledge of precautions, impaired UE functional use, pain, decreased ROM, postural dysfunction.   PT treatment/interventions: ADL/Self care home management, Therapeutic exercises, Therapeutic activity, Neuromuscular re-education, Patient/Family education, Self Care, DME instructions, Manual therapy, and Re-evaluation   GOALS: Goals reviewed with patient? Yes  LONG TERM GOALS:  (STG=LTG)  GOALS Name Target Date  Goal status  1 Pt will demonstrate she has regained full shoulder ROM and function post operatively compared to baselines.  Baseline: 09/12/22 INITIAL  2 Pt will be educated on lymphedema risk reduction or be signed up for ABC class 09/12/22 INITIAL  3 Pt will be able to tolerate radiation position  09/12/22 initial  4 Pt will decrease DQASH to 10% or less to demonstrate improved function 09/12/22 initial     PLAN:  PT FREQUENCY/DURATION: scheduled for 2x to start and then we will add more as needed up to 2x per week x 4 weeks  PLAN FOR NEXT SESSION: left shoulder AAROM/PROM, cording work PRN, (lymphedema education, scar education, SOZO, etc.) Did not give out post op education sheet.    [facility]  [address]  [id number] ([phone] After Breast Cancer Class It is recommended you attend the ABC class to be educated on lymphedema risk reduction. This class is free of charge and lasts for 1 hour. It is a 1-time class. You will need to download the Webex app either on your phone or computer. We will send you a link the night before or the morning of the class. You should be able to click on that link to join the class. This is not a confidential class. You don't have to turn your camera  on, but other participants may be able to see your email address.  Scar massage You can begin gentle scar massage to you incision sites. Gently place one hand on the incision and move the skin (without sliding on the skin) in various directions. Do this for a few minutes and then  you can gently massage either coconut oil or vitamin E cream into the scars.  Compression garment You should continue wearing your compression bra until you feel like you no longer have swelling.  Home exercise Program Continue doing the exercises you were given until you feel like you can do them without feeling any tightness at the end.   Walking Program Studies show that 30 minutes of walking per day (fast enough to elevate your heart rate) can significantly reduce the risk of a cancer recurrence. If you can't walk due to other medical reasons, we encourage you to find another activity you could do (like a stationary bike or water exercise).  Posture After breast cancer surgery, people frequently sit with rounded shoulders posture because it puts their incisions on slack and feels better. If you sit like this and scar tissue forms in that position, you can become very tight and have pain sitting or standing with good posture. Try to be aware of your posture and sit and stand up tall to heal properly.  Follow up PT: It is recommended you return every 3 months for the first 3 years following surgery to be assessed on the SOZO machine for an L-Dex score. This helps prevent clinically significant lymphedema in 95% of patients. These follow up screens are 10 minute appointments that you are not billed for.  Stark Bray, PT 10/02/2022, 4:48 PM

## 2022-10-02 NOTE — Telephone Encounter (Signed)
[facility] Patient Advocate Encounter  Patient's medication Veronica Robles) have been couriered to [facility] from [employer] [facility] and will be administered on the patient next office visit on 10/08/22.  [name] , [facility] Patient [facility] Phone: [phone] Fax:  [phone]

## 2022-10-03 ENCOUNTER — Other Ambulatory Visit: Payer: Self-pay

## 2022-10-03 ENCOUNTER — Ambulatory Visit
Admission: RE | Admit: 2022-10-03 | Discharge: 2022-10-03 | Disposition: A | Discharge status: Home / Self Care | Payer: Medicare HMO | Source: Ambulatory Visit | Attending: Radiation Oncology | Admitting: Radiation Oncology

## 2022-10-03 DIAGNOSIS — Z170 Estrogen receptor positive status [ER+]: Secondary | ICD-10-CM

## 2022-10-03 DIAGNOSIS — Z510 Encounter for antineoplastic radiation therapy: Secondary | ICD-10-CM | POA: Diagnosis not present

## 2022-10-03 LAB — RAD ONC ARIA SESSION SUMMARY

## 2022-10-03 MED ORDER — ALRA NON-METALLIC DEODORANT (RAD-ONC)
1.0000 | Freq: Once | TOPICAL | Status: AC
  Administered 2022-10-03: 1 via TOPICAL

## 2022-10-03 MED ORDER — RADIAPLEXRX EX GEL: Freq: Once | CUTANEOUS | Status: AC

## 2022-10-06 ENCOUNTER — Other Ambulatory Visit: Payer: Self-pay

## 2022-10-06 ENCOUNTER — Ambulatory Visit
Admission: RE | Admit: 2022-10-06 | Discharge: 2022-10-06 | Disposition: A | Discharge status: Home / Self Care | Payer: Medicare HMO | Source: Ambulatory Visit | Attending: Radiation Oncology | Admitting: Radiation Oncology

## 2022-10-06 DIAGNOSIS — Z510 Encounter for antineoplastic radiation therapy: Secondary | ICD-10-CM | POA: Diagnosis not present

## 2022-10-06 LAB — RAD ONC ARIA SESSION SUMMARY
Course Elapsed Days: 4
Plan Fractions Treated to Date: 3
Plan Fractions Treated to Date: 3
Plan Prescribed Dose Per Fraction: 1.8 Gy
Plan Prescribed Dose Per Fraction: 1.8 Gy
Plan Total Fractions Prescribed: 28
Plan Total Fractions Prescribed: 28
Plan Total Prescribed Dose: 50.4 Gy
Plan Total Prescribed Dose: 50.4 Gy
Reference Point Dosage Given to Date: 5.4 Gy
Reference Point Dosage Given to Date: 5.4 Gy
Reference Point Session Dosage Given: 1.8 Gy
Reference Point Session Dosage Given: 1.8 Gy
Session Number: 3

## 2022-10-07 ENCOUNTER — Ambulatory Visit
Admission: RE | Admit: 2022-10-07 | Discharge: 2022-10-07 | Disposition: A | Discharge status: Home / Self Care | Payer: Medicare HMO | Source: Ambulatory Visit | Attending: Radiation Oncology | Admitting: Radiation Oncology

## 2022-10-07 ENCOUNTER — Other Ambulatory Visit: Payer: Self-pay

## 2022-10-07 DIAGNOSIS — Z510 Encounter for antineoplastic radiation therapy: Secondary | ICD-10-CM | POA: Diagnosis not present

## 2022-10-07 LAB — RAD ONC ARIA SESSION SUMMARY

## 2022-10-08 ENCOUNTER — Encounter: Payer: Self-pay | Admitting: Rehabilitation

## 2022-10-08 ENCOUNTER — Other Ambulatory Visit: Payer: Self-pay

## 2022-10-08 ENCOUNTER — Ambulatory Visit: Payer: Medicare HMO | Admitting: Pharmacist

## 2022-10-08 ENCOUNTER — Ambulatory Visit
Admission: RE | Admit: 2022-10-08 | Discharge: 2022-10-08 | Disposition: A | Discharge status: Home / Self Care | Payer: Medicare HMO | Source: Ambulatory Visit | Attending: Radiation Oncology | Admitting: Radiation Oncology

## 2022-10-08 ENCOUNTER — Ambulatory Visit: Payer: Medicare HMO | Admitting: Rehabilitation

## 2022-10-08 DIAGNOSIS — R293 Abnormal posture: Secondary | ICD-10-CM | POA: Diagnosis present

## 2022-10-08 DIAGNOSIS — M25612 Stiffness of left shoulder, not elsewhere classified: Secondary | ICD-10-CM | POA: Diagnosis present

## 2022-10-08 DIAGNOSIS — B20 Human immunodeficiency virus [HIV] disease: Secondary | ICD-10-CM | POA: Diagnosis not present

## 2022-10-08 DIAGNOSIS — Z483 Aftercare following surgery for neoplasm: Secondary | ICD-10-CM

## 2022-10-08 DIAGNOSIS — C50412 Malignant neoplasm of upper-outer quadrant of left female breast: Secondary | ICD-10-CM | POA: Diagnosis present

## 2022-10-08 DIAGNOSIS — Z170 Estrogen receptor positive status [ER+]: Secondary | ICD-10-CM | POA: Diagnosis present

## 2022-10-08 DIAGNOSIS — Z510 Encounter for antineoplastic radiation therapy: Secondary | ICD-10-CM | POA: Diagnosis not present

## 2022-10-08 LAB — RAD ONC ARIA SESSION SUMMARY

## 2022-10-08 MED ORDER — LENACAPAVIR SODIUM 463.5 MG/1.5ML ~~LOC~~ SOLN
927.0000 mg | Freq: Once | SUBCUTANEOUS | Status: AC
  Administered 2022-10-08: 927 mg via SUBCUTANEOUS

## 2022-10-08 NOTE — Progress Notes (Signed)
HPI: Veronica Robles is a 67 y.o. female who presents to the [place] clinic for [facility] administration.  Patient Active Problem List   Diagnosis Date Noted   Port-A-Cath in place 08/08/2022   Family history of breast cancer 03/20/2022   Family history of pancreatic cancer 03/20/2022   Gastroesophageal reflux disease without esophagitis 03/12/2022   Weight loss 03/12/2022   Malignant neoplasm of upper-outer quadrant of left breast in female, estrogen receptor positive ([place]) 03/07/2022   Dental caries 12/01/2021   Other viral warts 12/01/2021   Healthcare maintenance 11/09/2018   VISUAL IMPAIRMENT 10/01/2007   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   HYPERTENSION, BENIGN 01/26/2007   SHINGLES, RECURRENT 12/08/2006   HIV disease ([place]) 10/26/2006   HX, PERSONAL, PENICILLIN ALLERGY 10/26/2006   HX, PERSONAL, DRUG ALLERGY NOS 10/26/2006    Patient's Medications  New Prescriptions   No medications on file  Previous Medications   BLOOD PRESSURE MONITORING (ADULT BLOOD PRESSURE CUFF LG) KIT        EMTRICITABINE-TENOFOVIR AF (DESCOVY) 200-25 MG TABLET    Take 1 tablet by mouth daily.   FOSTEMSAVIR TROMETHAMINE (RUKOBIA) 600 MG TB12 ER TABLET    Take 1 tablet (600 mg total) by mouth 2 (two) times daily.   HYDROCHLOROTHIAZIDE (HYDRODIURIL) 25 MG TABLET    Take 1 tablet (25 mg total) by mouth daily.   LIDOCAINE-PRILOCAINE (EMLA) CREAM    Apply to affected area once   ONDANSETRON (ZOFRAN) 8 MG TABLET    Take 1 tablet (8 mg) by mouth every 8 hours as needed for nausea/vomiting. Start third day after doxorubicin/cyclophosphamide chemotherapy.   PROCHLORPERAZINE (COMPAZINE) 10 MG TABLET    Take 1 tablet (10 mg total) by mouth every 6 (six) hours as needed for nausea or vomiting.   SQ INJECTION LENACAPAVIR (SUNLENCA) 463.5 MG/1.5ML SQ INJECTION    Inject 3 mLs (927 mg total) into the skin every 6 (six) months. Administer each injection subcutaneously at separate sites in the abdomen (more or  equal to 2 inches from the navel).   SULFAMETHOXAZOLE-TRIMETHOPRIM (BACTRIM) 200-40 MG/5ML SUSPENSION    Take 20 mLs by mouth daily.  Modified Medications   No medications on file  Discontinued Medications   No medications on file    Allergies: Allergies  Allergen Reactions   Penicillins Rash    Has patient had a PCN reaction causing immediate rash, facial/tongue/throat swelling, SOB or lightheadedness with hypotension: No Has patient had a PCN reaction causing severe rash involving mucus membranes or skin necrosis: No Has patient had a PCN reaction that required hospitalization No Has patient had a PCN reaction occurring within the last 10 years: No If all of the above answers are "NO", then may proceed with Cephalosporin use.     Past Medical History: Past Medical History:  Diagnosis Date   Breast cancer ([place]) 02/2022   left breast IDC with DCIS   Family history of breast cancer 03/20/2022   Family history of pancreatic cancer 03/20/2022   HIV infection ([place]) 1993   Hypertension     Social History: Social History   Socioeconomic History   Marital status: Single    Spouse name: Not on file   Number of children: Not on file   Years of education: Not on file   Highest education level: Not on file  Occupational History   Not on file  Tobacco Use   Smoking status: Former    Packs/day: 0.10    Types: Cigarettes   Smokeless tobacco: Never  Vaping Use   Vaping Use: Never used  Substance and Sexual Activity   Alcohol use: No    Alcohol/week: 0.0 standard drinks of alcohol   Drug use: No   Sexual activity: Not Currently    Partners: Male    Birth control/protection: Surgical    Comment: declined condoms  Other Topics Concern   Not on file  Social History Narrative   Not on file   Social Determinants of Health   Financial Resource Strain: Low Risk  (08/14/2022)   Overall Financial Resource Strain (CARDIA)    Difficulty of Paying Living Expenses: Not hard at all   Food Insecurity: No Food Insecurity (08/14/2022)   Hunger Vital Sign    Worried About Running Out of Food in the Last Year: Never true    Ran Out of Food in the Last Year: Never true  Transportation Needs: No Transportation Needs (08/14/2022)   PRAPARE - Hydrologist (Medical): No    Lack of Transportation (Non-Medical): No  Physical Activity: Inactive (08/14/2022)   Exercise Vital Sign    Days of Exercise per Week: 0 days    Minutes of Exercise per Session: 0 min  Stress: No Stress Concern Present (08/14/2022)   [place]    Feeling of Stress : Not at all  Social Connections: Not on file    Labs: Lab Results  Component Value Date   HIV1RNAQUANT <20 (H) 08/12/2022   HIV1RNAQUANT <20 (H) 05/13/2022   HIV1RNAQUANT 52 (H) 04/29/2022   CD4TABS 128 (L) 08/12/2022   CD4TABS 302 (L) 06/13/2021   CD4TABS 278 (L) 11/01/2020    RPR and STI Lab Results  Component Value Date   LABRPR NON-REACTIVE 05/01/2020   LABRPR NON-REACTIVE 05/10/2018   LABRPR NON REAC 10/30/2015   LABRPR NON REAC 05/24/2015   LABRPR NON REAC 12/05/2013    STI Results GC CT  05/01/2020  2:25 PM Negative  Negative   03/04/2016 12:00 AM Negative  Negative   12/05/2013 12:00 AM  CT: Negative     Hepatitis B Lab Results  Component Value Date   HEPBSAB INDETER (A) 08/08/2009   HEPBSAG NEG 08/08/2009   HEPBCAB POS (A) 08/08/2009   Hepatitis C No results found for: "HEPCAB", "HCVRNAPCRQN" Hepatitis A Lab Results  Component Value Date   HAV POS (A) 08/08/2009   Lipids: Lab Results  Component Value Date   CHOL 203 (H) 01/04/2020   TRIG 133 01/04/2020   HDL 51 01/04/2020   CHOLHDL 4.0 01/04/2020   VLDL 37 (H) 10/30/2015   LDLCALC 127 (H) 01/04/2020    Current HIV Regimen: Rukobia + Descovy + Sunlenca  TARGET DATE: 10/14/2022 (6 months from first injection)  Assessment: Veronica Robles presents today for her  Sunlenca injections. She denies any further issues with these injections; two small, non-painful knots remain at prior injection sites. Will administer Sunlenca on right side of abdomen again today given ongoing radiation with left breast cancer as was discussed with Veronica Robles and per patient preference. Ensured that the injections were administered at least two inches from the navel and two inches away from the last two injection sites. She reported slight burning immediately after the injections but otherwise tolerated the injections well.    Administered lenacapavir 463.5/1.16m subcutaneously in upper right abdomen. Administered lenacapavir 463.5/1.517msubcutaneously in lower right abdomen more than two inches away from first injection site. Monitored patient for 10 minutes after injection. Will need  a follow up appointment for injections in 6 months.  Will defer labs (HIV RNA and CD4 count) today until she follows up with Dr. Baxter Flattery next month.  She states she has not taken her liquid Bactrim recently and cannot remember when she last took it. It was last filled in January. Encouraged her to continue filling this every month and taking it daily until we recheck her CD4 count as she is more susceptible to certain infections given her CD4 count < 200. She verbalized understanding.    Plan: - Continue Bactrim, Descovy, and Rukobia  - Sunlenca injections administered  - Follow up with Dr. Baxter Flattery on 4/23 (check labs at this visit)  - Schedule Sunlenca injections in 6 months - Contact for any issues or questions  Alfonse Spruce, PharmD, CPP, BCIDP, [place] Clinical Pharmacist Practitioner Infectious Diseases [place] [facility]

## 2022-10-08 NOTE — Therapy (Signed)
OUTPATIENT PHYSICAL THERAPY BREAST CANCER TREATMENT   Patient Name: Veronica Robles MRN: [id number] DOB:04/18/1956, 67 y.o., female Today's Date: 10/08/2022  END OF SESSION:  PT End of Session - 10/08/22 1608     Visit Number 6    Number of Visits 8    PT Start Time [id number]    PT Stop Time [id number]    PT Time Calculation (min) 48 min    Activity Tolerance Patient tolerated treatment well    Behavior During Therapy [facility] for tasks assessed/performed              Past Medical History:  Diagnosis Date   Breast cancer ([place]) 02/2022   left breast IDC with DCIS   Family history of breast cancer 03/20/2022   Family history of pancreatic cancer 03/20/2022   HIV infection ([place]) 1993   Hypertension    Past Surgical History:  Procedure Laterality Date   ABDOMINAL HYSTERECTOMY     APPENDECTOMY     BREAST BIOPSY Right 2015   BREAST BIOPSY Left 08/26/2022   [place] LT RADIOACTIVE SEED LOC 08/26/2022 [facility]AMMOGRAPHY   BREAST BIOPSY  08/26/2022   MM LT RADIOACTIVE SEED LOC MAMMO GUIDE 08/26/2022 [facility] MAMMOGRAPHY   BREAST LUMPECTOMY WITH RADIOACTIVE SEED LOCALIZATION Left 08/27/2022   Procedure: LEFT BREAST LUMPECTOMY WITH RADIOACTIVE SEED LOCALIZATION;  Surgeon: [name], MD;  Location: [place];  Service: General;  Laterality: Left;   CYSTOSCOPY/RETROGRADE/URETEROSCOPY Right 11/14/2016   Procedure: CYSTOSCOPY/RETROGRADE/URETEROSCOPY/ STONE EXTRACTION/HOLMIUM LASER/STENT PLACEMENT;  Surgeon: Kathie Rhodes, MD;  Location: [facility];  Service: Urology;  Laterality: Right;   PORT-A-CATH REMOVAL Right 08/27/2022   Procedure: REMOVAL PORT-A-CATH;  Surgeon: [name], MD;  Location: [place];  Service: General;  Laterality: Right;   PORTACATH PLACEMENT Right 03/26/2022   Procedure: INSERTION PORT-A-CATH;  Surgeon: [name], MD;  Location: [place];  Service: General;  Laterality: Right;   RADIOACTIVE SEED GUIDED AXILLARY SENTINEL  LYMPH NODE Left 08/27/2022   Procedure: RADIOACTIVE SEED GUIDED LEFT AXILLARY SENTINEL LYMPH NODE DISSECTION;  Surgeon: [name], MD;  Location: [place];  Service: General;  Laterality: Left;   Patient Active Problem List   Diagnosis Date Noted   Port-A-Cath in place 08/08/2022   Family history of breast cancer 03/20/2022   Family history of pancreatic cancer 03/20/2022   Gastroesophageal reflux disease without esophagitis 03/12/2022   Weight loss 03/12/2022   Malignant neoplasm of upper-outer quadrant of left breast in female, estrogen receptor positive ([place]) 03/07/2022   Dental caries 12/01/2021   Other viral warts 12/01/2021   Healthcare maintenance 11/09/2018   VISUAL IMPAIRMENT 10/01/2007   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   HYPERTENSION, BENIGN 01/26/2007   SHINGLES, RECURRENT 12/08/2006   HIV disease ([employer]) 10/26/2006   HX, PERSONAL, PENICILLIN ALLERGY 10/26/2006   HX, PERSONAL, DRUG ALLERGY NOS 10/26/2006    REFERRING PROVIDER: Dr. Ninfa Linden  REFERRING DIAG: Left breast cancer  THERAPY DIAG:  Malignant neoplasm of upper-outer quadrant of left breast in female, estrogen receptor positive ([place])  Aftercare following surgery for neoplasm  Abnormal posture  Stiffness of left shoulder, not elsewhere classified  Rationale for Evaluation and Treatment: Rehabilitation  ONSET DATE: 03/19/22  SUBJECTIVE:  SUBJECTIVE STATEMENT: I am still doing okay  PERTINENT HISTORY:  Post left lumpectomy 08/27/22.  3 of 4 lymph nodes positive. Neoadjuvant chemotherapy completed and will be having radiation. It is ER positive, PR negative, and HER2 negative with a Ki67 of 10%. She has a positive axillary node. She also has HIV disease which she has not shared with her family.    PATIENT GOALS:  Reassess how my recovery is going related to arm function, pain, and swelling.  PAIN:  Are you having pain? No   PRECAUTIONS: Recent Surgery, left UE Lymphedema risk, HIV  ACTIVITY LEVEL / LEISURE: Retired.  Getting the seat belt is hard.     OBJECTIVE:   PATIENT SURVEYS:  QUICK DASH: 43% from 0%   OBSERVATIONS: Very guarded.  Does not want to touch operated side. Well healed incisions. Still wearing compression.   POSTURE:  Forward shoulder, thoracic kyphosis   LYMPHEDEMA ASSESSMENT:   UPPER EXTREMITY AROM/PROM:   A/PROM RIGHT   eval    Shoulder extension 50  Shoulder flexion 136  Shoulder abduction 149  Shoulder internal rotation 64  Shoulder external rotation 73                          (Blank rows = not tested)   A/PROM LEFT   eval 09/17/22 09/25/22 10/08/22  Shoulder extension 54 60    Shoulder flexion 137 90 - tight in axilla  140 148  Shoulder abduction 151 85 - tight in axilla  132 151  Shoulder internal rotation 59     Shoulder external rotation 83 83                            (Blank rows = not tested)    UPPER EXTREMITY STRENGTH: WNL    LYMPHEDEMA ASSESSMENTS:    LANDMARK RIGHT   eval  10 cm proximal to olecranon process 25.7  Olecranon process 22.1  10 cm proximal to ulnar styloid process 19.6  Just proximal to ulnar styloid process 14.7  Across hand at thumb web space 17.6  At base of 2nd digit 6.2  (Blank rows = not tested)   LANDMARK LEFT   eval LEFT 09/22/2022  10 cm proximal to olecranon process 25.2 24.5  Olecranon process 21.5 21.4  10 cm proximal to ulnar styloid process 18.8 18.5  Just proximal to ulnar styloid process 14.7 14.5  Across hand at thumb web space 17.2 18.0  At base of 2nd digit 6 6.0  (Blank rows = not tested)  TODAY"S TREATMENT: Pt permission and consent throughout each step of examination and treatment with modification and draping if requested when working on sensitive  areas  10/08/2022 Pulleys for flexion, abd x 31mn ea Lymphedema risk reduction discussion and scheduled out SOZO Supine wand flexion x 10 Snow angel x 10 Supine horizontal abduction x 6 with initial cueing  Bil ER x 6  Diagonals x 6 bil  Standing bil row yellow x 10  PROM left shoulder flexion, scaption, abd, IR and ER  10/02/2022 Pulleys for flexion, scaption and abd x 273m ea Ball rolls on wall x 5 ea forward and abduction Supine wand flexion x 5 Snow angel x 5 Alternating shoulder flexion x 5 each Standing bil row yellow x 10  PROM left shoulder flexion, scaption, abd, IR and ER MFR to left axillary cording with arm   09/25/2022 Pulleys for flexion, scaption  and abd x 29mn ea Ball rolls on wall x 5 ea forward Standing abduction dowel in front of the mirror x 5 Supine wand flexion x 5 PROM left shoulder flexion, scaption, abd, IR and ER MFR to left axillary cording with arm on 2nd pillow, repeated elbow extension  PATIENT EDUCATION:  Education details: HEP to focus on per below, cording, importance of touch and cortical mapping etc to improve function Person educated: Patient Education method: EConsulting civil engineer Demonstration, Tactile cues, Verbal cues, and Handouts Education comprehension: verbalized understanding and needs further education  HOME EXERCISE PROGRAM: Reviewed previously given post op HEP: updated to supine dowel flexion, supine chest stretch, added wall flexion Gave pulley handout  ASSESSMENT:  CLINICAL IMPRESSION: Pt demonstrates very good improvements in AROM and has met her baseline AROM now.  She will have 1 more visit for final HEP.   Pt will benefit from skilled therapeutic intervention to improve on the following deficits: Decreased knowledge of precautions, impaired UE functional use, pain, decreased ROM, postural dysfunction.   PT treatment/interventions: ADL/Self care home management, Therapeutic exercises, Therapeutic activity, Neuromuscular  re-education, Patient/Family education, Self Care, DME instructions, Manual therapy, and Re-evaluation   GOALS: Goals reviewed with patient? Yes  LONG TERM GOALS:  (STG=LTG)  GOALS Name Target Date  Goal status  1 Pt will demonstrate she has regained full shoulder ROM and function post operatively compared to baselines.  Baseline: 09/12/22 MET  2 Pt will be educated on lymphedema risk reduction or be signed up for ABC class 09/12/22 INITIAL  3 Pt will be able to tolerate radiation position  09/12/22 initial  4 Pt will decrease DQASH to 10% or less to demonstrate improved function 09/12/22 initial     PLAN:  PT FREQUENCY/DURATION: scheduled for 2x to start and then we will add more as needed up to 2x per week x 4 weeks  PLAN FOR NEXT SESSION: left shoulder AAROM/PROM, cording work PRN   [employer] [address]  [id number] ([phone] After Breast Cancer Class It is recommended you attend the ABC class to be educated on lymphedema risk reduction. This class is free of charge and lasts for 1 hour. It is a 1-time class. You will need to download the Webex app either on your phone or computer. We will send you a link the night before or the morning of the class. You should be able to click on that link to join the class. This is not a confidential class. You don't have to turn your camera on, but other participants may be able to see your email address.  Scar massage You can begin gentle scar massage to you incision sites. Gently place one hand on the incision and move the skin (without sliding on the skin) in various directions. Do this for a few minutes and then you can gently massage either coconut oil or vitamin E cream into the scars.  Compression garment You should continue wearing your compression bra until you feel like you no longer have swelling.  Home exercise Program Continue doing the exercises you were given until you feel  like you can do them without feeling any tightness at the end.   Walking Program Studies show that 30 minutes of walking per day (fast enough to elevate your heart rate) can significantly reduce the risk of a cancer recurrence. If you can't walk due to other medical reasons, we encourage you to find another activity you could  do (like a stationary bike or water exercise).  Posture After breast cancer surgery, people frequently sit with rounded shoulders posture because it puts their incisions on slack and feels better. If you sit like this and scar tissue forms in that position, you can become very tight and have pain sitting or standing with good posture. Try to be aware of your posture and sit and stand up tall to heal properly.  Follow up PT: It is recommended you return every 3 months for the first 3 years following surgery to be assessed on the SOZO machine for an L-Dex score. This helps prevent clinically significant lymphedema in 95% of patients. These follow up screens are 10 minute appointments that you are not billed for.  Stark Bray, PT 10/08/2022, 4:55 PM

## 2022-10-09 ENCOUNTER — Other Ambulatory Visit: Payer: Self-pay

## 2022-10-09 ENCOUNTER — Ambulatory Visit
Admission: RE | Admit: 2022-10-09 | Discharge: 2022-10-09 | Disposition: A | Discharge status: Home / Self Care | Payer: Medicare HMO | Source: Ambulatory Visit | Attending: Radiation Oncology | Admitting: Radiation Oncology

## 2022-10-09 DIAGNOSIS — Z510 Encounter for antineoplastic radiation therapy: Secondary | ICD-10-CM | POA: Diagnosis not present

## 2022-10-09 LAB — RAD ONC ARIA SESSION SUMMARY

## 2022-10-10 ENCOUNTER — Ambulatory Visit
Admission: RE | Admit: 2022-10-10 | Discharge: 2022-10-10 | Disposition: A | Discharge status: Home / Self Care | Payer: Medicare HMO | Source: Ambulatory Visit | Attending: Radiation Oncology | Admitting: Radiation Oncology

## 2022-10-10 ENCOUNTER — Encounter: Payer: Self-pay | Admitting: *Deleted

## 2022-10-10 ENCOUNTER — Other Ambulatory Visit: Payer: Self-pay

## 2022-10-10 ENCOUNTER — Telehealth: Payer: Self-pay | Admitting: Hematology and Oncology

## 2022-10-10 DIAGNOSIS — Z510 Encounter for antineoplastic radiation therapy: Secondary | ICD-10-CM | POA: Diagnosis not present

## 2022-10-10 LAB — RAD ONC ARIA SESSION SUMMARY

## 2022-10-10 NOTE — Telephone Encounter (Signed)
Per 3/15 IB reached out to patient to schedule, left voicemail.

## 2022-10-13 ENCOUNTER — Other Ambulatory Visit: Payer: Self-pay

## 2022-10-13 ENCOUNTER — Ambulatory Visit
Admission: RE | Admit: 2022-10-13 | Discharge: 2022-10-13 | Disposition: A | Discharge status: Home / Self Care | Payer: Medicare HMO | Source: Ambulatory Visit | Attending: Radiation Oncology | Admitting: Radiation Oncology

## 2022-10-13 DIAGNOSIS — Z510 Encounter for antineoplastic radiation therapy: Secondary | ICD-10-CM | POA: Diagnosis not present

## 2022-10-13 LAB — RAD ONC ARIA SESSION SUMMARY
Course Elapsed Days: 11
Plan Fractions Treated to Date: 8
Plan Fractions Treated to Date: 8
Plan Prescribed Dose Per Fraction: 1.8 Gy
Plan Prescribed Dose Per Fraction: 1.8 Gy
Plan Total Fractions Prescribed: 28
Plan Total Fractions Prescribed: 28
Plan Total Prescribed Dose: 50.4 Gy
Plan Total Prescribed Dose: 50.4 Gy
Reference Point Dosage Given to Date: 14.4 Gy
Reference Point Dosage Given to Date: 14.4 Gy
Reference Point Session Dosage Given: 1.8 Gy
Reference Point Session Dosage Given: 1.8 Gy
Session Number: 8

## 2022-10-14 ENCOUNTER — Other Ambulatory Visit: Payer: Self-pay

## 2022-10-14 ENCOUNTER — Ambulatory Visit
Admission: RE | Admit: 2022-10-14 | Discharge: 2022-10-14 | Disposition: A | Discharge status: Home / Self Care | Payer: Medicare HMO | Source: Ambulatory Visit | Attending: Radiation Oncology | Admitting: Radiation Oncology

## 2022-10-14 DIAGNOSIS — Z510 Encounter for antineoplastic radiation therapy: Secondary | ICD-10-CM | POA: Diagnosis not present

## 2022-10-14 LAB — RAD ONC ARIA SESSION SUMMARY
Course Elapsed Days: 12
Plan Fractions Treated to Date: 9
Plan Fractions Treated to Date: 9
Plan Prescribed Dose Per Fraction: 1.8 Gy
Plan Prescribed Dose Per Fraction: 1.8 Gy
Plan Total Fractions Prescribed: 28
Plan Total Fractions Prescribed: 28
Plan Total Prescribed Dose: 50.4 Gy
Plan Total Prescribed Dose: 50.4 Gy
Reference Point Dosage Given to Date: 16.2 Gy
Reference Point Dosage Given to Date: 16.2 Gy
Reference Point Session Dosage Given: 1.8 Gy
Reference Point Session Dosage Given: 1.8 Gy
Session Number: 9

## 2022-10-15 ENCOUNTER — Ambulatory Visit
Admission: RE | Admit: 2022-10-15 | Discharge: 2022-10-15 | Disposition: A | Discharge status: Home / Self Care | Payer: Medicare HMO | Source: Ambulatory Visit | Attending: Radiation Oncology | Admitting: Radiation Oncology

## 2022-10-15 ENCOUNTER — Encounter: Payer: Self-pay | Admitting: Rehabilitation

## 2022-10-15 ENCOUNTER — Other Ambulatory Visit: Payer: Self-pay

## 2022-10-15 ENCOUNTER — Ambulatory Visit: Payer: Medicare HMO | Admitting: Rehabilitation

## 2022-10-15 DIAGNOSIS — C50412 Malignant neoplasm of upper-outer quadrant of left female breast: Secondary | ICD-10-CM | POA: Diagnosis not present

## 2022-10-15 DIAGNOSIS — M25612 Stiffness of left shoulder, not elsewhere classified: Secondary | ICD-10-CM

## 2022-10-15 DIAGNOSIS — Z170 Estrogen receptor positive status [ER+]: Secondary | ICD-10-CM

## 2022-10-15 DIAGNOSIS — Z510 Encounter for antineoplastic radiation therapy: Secondary | ICD-10-CM | POA: Diagnosis not present

## 2022-10-15 DIAGNOSIS — Z483 Aftercare following surgery for neoplasm: Secondary | ICD-10-CM

## 2022-10-15 DIAGNOSIS — R293 Abnormal posture: Secondary | ICD-10-CM

## 2022-10-15 LAB — RAD ONC ARIA SESSION SUMMARY

## 2022-10-15 NOTE — Therapy (Signed)
OUTPATIENT PHYSICAL THERAPY BREAST CANCER TREATMENT   Patient Name: Veronica Robles MRN: [id number] DOB:March 04, 1956, 67 y.o., female Today's Date: 10/15/2022  END OF SESSION:  PT End of Session - 10/15/22 1500     Visit Number 7    Number of Visits 8    PT Start Time 1502    PT Stop Time 1550    PT Time Calculation (min) 48 min    Activity Tolerance Patient tolerated treatment well    Behavior During Therapy [facility] for tasks assessed/performed              Past Medical History:  Diagnosis Date   Breast cancer ([place]) 02/2022   left breast IDC with DCIS   Family history of breast cancer 03/20/2022   Family history of pancreatic cancer 03/20/2022   HIV infection ([place]) 1993   Hypertension    Past Surgical History:  Procedure Laterality Date   ABDOMINAL HYSTERECTOMY     APPENDECTOMY     BREAST BIOPSY Right 2015   BREAST BIOPSY Left 08/26/2022   [place] LT RADIOACTIVE SEED LOC 08/26/2022 [facility]   BREAST BIOPSY  08/26/2022   MM LT RADIOACTIVE SEED LOC MAMMO GUIDE 08/26/2022 [facility]   BREAST LUMPECTOMY WITH RADIOACTIVE SEED LOCALIZATION Left 08/27/2022   Procedure: LEFT BREAST LUMPECTOMY WITH RADIOACTIVE SEED LOCALIZATION;  Surgeon: [name], MD;  Location: [place];  Service: General;  Laterality: Left;   CYSTOSCOPY/RETROGRADE/URETEROSCOPY Right 11/14/2016   Procedure: CYSTOSCOPY/RETROGRADE/URETEROSCOPY/ STONE EXTRACTION/HOLMIUM LASER/STENT PLACEMENT;  Surgeon: Kathie Rhodes, MD;  Location: [facility];  Service: Urology;  Laterality: Right;   PORT-A-CATH REMOVAL Right 08/27/2022   Procedure: REMOVAL PORT-A-CATH;  Surgeon: [name], MD;  Location: [place];  Service: General;  Laterality: Right;   PORTACATH PLACEMENT Right 03/26/2022   Procedure: INSERTION PORT-A-CATH;  Surgeon: [name], MD;  Location: [place];  Service: General;  Laterality: Right;   RADIOACTIVE SEED GUIDED AXILLARY SENTINEL  LYMPH NODE Left 08/27/2022   Procedure: RADIOACTIVE SEED GUIDED LEFT AXILLARY SENTINEL LYMPH NODE DISSECTION;  Surgeon: [name], MD;  Location: [place];  Service: General;  Laterality: Left;   Patient Active Problem List   Diagnosis Date Noted   Port-A-Cath in place 08/08/2022   Family history of breast cancer 03/20/2022   Family history of pancreatic cancer 03/20/2022   Gastroesophageal reflux disease without esophagitis 03/12/2022   Weight loss 03/12/2022   Malignant neoplasm of upper-outer quadrant of left breast in female, estrogen receptor positive ([place]) 03/07/2022   Dental caries 12/01/2021   Other viral warts 12/01/2021   Healthcare maintenance 11/09/2018   VISUAL IMPAIRMENT 10/01/2007   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 Ninfa Linden  REFERRING DIAG: Left breast cancer  THERAPY DIAG:  Malignant neoplasm of upper-outer quadrant of left breast in female, estrogen receptor positive ([place])  Aftercare following surgery for neoplasm  Abnormal posture  Stiffness of left shoulder, not elsewhere classified  Rationale for Evaluation and Treatment: Rehabilitation  ONSET DATE: 03/19/22  SUBJECTIVE:  SUBJECTIVE STATEMENT: I am doing pretty well   PERTINENT HISTORY:  Post left lumpectomy 08/27/22.  3 of 4 lymph nodes positive. Neoadjuvant chemotherapy completed and will be having radiation. It is ER positive, PR negative, and HER2 negative with a Ki67 of 10%. She has a positive axillary node. She also has HIV disease which she has not shared with her family.    PATIENT GOALS:  Reassess how my recovery is going related to arm function, pain, and swelling.  PAIN:  Are you having pain? No   PRECAUTIONS: Recent Surgery, left UE Lymphedema risk, HIV  ACTIVITY LEVEL / LEISURE: Retired.  Getting the seat belt is hard.     OBJECTIVE:   PATIENT SURVEYS:  QUICK DASH: 43% from 0% , 15% on 10/15/22  OBSERVATIONS: Very guarded.  Does not want to touch operated side. Well healed incisions. Still wearing compression.   POSTURE:  Forward shoulder, thoracic kyphosis   LYMPHEDEMA ASSESSMENT:   UPPER EXTREMITY AROM/PROM:   A/PROM RIGHT   eval    Shoulder extension 50  Shoulder flexion 136  Shoulder abduction 149  Shoulder internal rotation 64  Shoulder external rotation 73                          (Blank rows = not tested)   A/PROM LEFT   eval 09/17/22 09/25/22 10/08/22  Shoulder extension 54 60    Shoulder flexion 137 90 - tight in axilla  140 148  Shoulder abduction 151 85 - tight in axilla  132 151  Shoulder internal rotation 59     Shoulder external rotation 83 83                            (Blank rows = not tested)    UPPER EXTREMITY STRENGTH: WNL    LYMPHEDEMA ASSESSMENTS:    LANDMARK RIGHT   eval  10 cm proximal to olecranon process 25.7  Olecranon process 22.1  10 cm proximal to ulnar styloid process 19.6  Just proximal to ulnar styloid process 14.7  Across hand at thumb web space 17.6  At base of 2nd digit 6.2  (Blank rows = not tested)   LANDMARK LEFT   eval LEFT 09/22/2022  10 cm proximal to olecranon process 25.2 24.5  Olecranon process 21.5 21.4  10 cm proximal to ulnar styloid process 18.8 18.5  Just proximal to ulnar styloid process 14.7 14.5  Across hand at thumb web space 17.2 18.0  At base of 2nd digit 6 6.0  (Blank rows = not tested)  TODAY"S TREATMENT: Pt permission and consent throughout each step of examination and treatment with modification and draping if requested when working on sensitive  areas  10/15/2022 Pulleys for flexion, abd x 51min ea Supine wand flexion x 10 Bil ER x 10 yellow added to HEP Diagonals x 10 yellow added to HEP Standing bil row yellow x 10 added to HEP PROM left shoulder flexion, scaption, abd, IR and ER without limitation Reviewed final HEP  10/08/2022 Pulleys for flexion, abd x 48min ea Lymphedema risk reduction discussion and scheduled out SOZO Supine wand flexion x 10 Snow angel x 10 Supine horizontal abduction x 6 with initial cueing yellow Bil ER x 6 yellow  Diagonals x 6 bil yellow Standing bil row yellow x 10  PROM left shoulder flexion, scaption, abd, IR and ER  10/02/2022 Pulleys for flexion, scaption and abd x  33min ea Ball rolls on wall x 5 ea forward and abduction Supine wand flexion x 5 Snow angel x 5 Alternating shoulder flexion x 5 each Standing bil row yellow x 10  PROM left shoulder flexion, scaption, abd, IR and ER MFR to left axillary cording with arm   09/25/2022 Pulleys for flexion, scaption and abd x 41min ea Ball rolls on wall x 5 ea forward Standing abduction dowel in front of the mirror x 5 Supine wand flexion x 5 PROM left shoulder flexion, scaption, abd, IR and ER MFR to left axillary cording with arm on 2nd pillow, repeated elbow extension  PATIENT EDUCATION:  Education details: HEP to focus on per below, cording, importance of touch and cortical mapping etc to improve function Person educated: Patient Education method: Explanation, Demonstration, Tactile cues, Verbal cues, and Handouts Education comprehension: verbalized understanding and needs further education  HOME EXERCISE PROGRAM: Reviewed previously given post op HEP: updated to supine dowel flexion, supine chest stretch, added wall flexion Gave pulley handout Access Code: [email or web address] URL: [email or web address] Date: 10/15/2022 Prepared by: Shan Levans  Exercises - Standing Row with Anchored Resistance  - 1 x daily - 3-4 x weekly - 1-3  sets - 10 reps - 2-3 second hold - Standing Shoulder External Rotation with Resistance  - 1 x daily - 3-4 x weekly - 1-3 sets - 10 reps - 2-3 second hold - Standing Shoulder Flexion Wall Walk  - 1 x daily - 7 x weekly - 1 sets - 5 reps - 3-4 second hold - Standing Shoulder Abduction Finger Walk at Wall  - 1 x daily - 7 x weekly - 1 sets - 5 reps - 3-4 hold - Supine Alternating Shoulder Flexion  - 1 x daily - 7 x weekly - 1 sets - 5 reps - 5 second hold  ASSESSMENT:  CLINICAL IMPRESSION: Pt demonstrates very good improvements and has met all goals except for QDASH which has improved to 15% vs 10% from 43%.  She is reporting no limitations at home.   Pt will benefit from skilled therapeutic intervention to improve on the following deficits: Decreased knowledge of precautions, impaired UE functional use, pain, decreased ROM, postural dysfunction.   PT treatment/interventions: ADL/Self care home management, Therapeutic exercises, Therapeutic activity, Neuromuscular re-education, Patient/Family education, Self Care, DME instructions, Manual therapy, and Re-evaluation   GOALS: Goals reviewed with patient? Yes  LONG TERM GOALS:  (STG=LTG)  GOALS Name Target Date  Goal status  1 Pt will demonstrate she has regained full shoulder ROM and function post operatively compared to baselines.  Baseline: 09/12/22 MET  2 Pt will be educated on lymphedema risk reduction or be signed up for ABC class 09/12/22 MET  3 Pt will be able to tolerate radiation position  09/12/22 MET  4 Pt will decrease DQASH to 10% or less to demonstrate improved function 09/12/22 PARTIALLY MET 15% on 10/15/22     PLAN:  PT FREQUENCY/DURATION: scheduled for 2x to start and then we will add more as needed up to 2x per week x 4 weeks  PLAN FOR NEXT SESSION: left shoulder AAROM/PROM, cording work PRN   [employer]  [address], [address]  [place] [address]  [phone]  After Breast Cancer  Class It is recommended you attend the ABC class to be educated on lymphedema risk reduction. This class is free of charge and lasts for 1 hour. It is a 1-time class. You will need to download the Webex  app either on your phone or computer. We will send you a link the night before or the morning of the class. You should be able to click on that link to join the class. This is not a confidential class. You don't have to turn your camera on, but other participants may be able to see your email address.  Scar massage You can begin gentle scar massage to you incision sites. Gently place one hand on the incision and move the skin (without sliding on the skin) in various directions. Do this for a few minutes and then you can gently massage either coconut oil or vitamin E cream into the scars.  Compression garment You should continue wearing your compression bra until you feel like you no longer have swelling.  Home exercise Program Continue doing the exercises you were given until you feel like you can do them without feeling any tightness at the end.   Walking Program Studies show that 30 minutes of walking per day (fast enough to elevate your heart rate) can significantly reduce the risk of a cancer recurrence. If you can't walk due to other medical reasons, we encourage you to find another activity you could do (like a stationary bike or water exercise).  Posture After breast cancer surgery, people frequently sit with rounded shoulders posture because it puts their incisions on slack and feels better. If you sit like this and scar tissue forms in that position, you can become very tight and have pain sitting or standing with good posture. Try to be aware of your posture and sit and stand up tall to heal properly.  Follow up PT: It is recommended you return every 3 months for the first 3 years following surgery to be assessed on the SOZO machine for an L-Dex score. This helps prevent clinically  significant lymphedema in 95% of patients. These follow up screens are 10 minute appointments that you are not billed for.  Stark Bray, PT 10/15/2022, 4:58 PM  PHYSICAL THERAPY DISCHARGE SUMMARY  Visits from Start of Care: 7  Current functional level related to goals / functional outcomes: See above   Remaining deficits: Lymphedema risk   Education / Equipment: Final HEP  Plan: Patient agrees to discharge.  Patient is being discharged due to meeting the stated rehab goals.

## 2022-10-16 ENCOUNTER — Ambulatory Visit
Admission: RE | Admit: 2022-10-16 | Discharge: 2022-10-16 | Disposition: A | Discharge status: Home / Self Care | Payer: Medicare HMO | Source: Ambulatory Visit | Attending: Radiation Oncology | Admitting: Radiation Oncology

## 2022-10-16 ENCOUNTER — Other Ambulatory Visit: Payer: Self-pay

## 2022-10-16 DIAGNOSIS — Z510 Encounter for antineoplastic radiation therapy: Secondary | ICD-10-CM | POA: Diagnosis not present

## 2022-10-16 LAB — RAD ONC ARIA SESSION SUMMARY

## 2022-10-17 ENCOUNTER — Other Ambulatory Visit: Payer: Self-pay

## 2022-10-17 ENCOUNTER — Ambulatory Visit
Admission: RE | Admit: 2022-10-17 | Discharge: 2022-10-17 | Disposition: A | Discharge status: Home / Self Care | Payer: Medicare HMO | Source: Ambulatory Visit | Attending: Radiation Oncology | Admitting: Radiation Oncology

## 2022-10-17 DIAGNOSIS — Z510 Encounter for antineoplastic radiation therapy: Secondary | ICD-10-CM | POA: Diagnosis not present

## 2022-10-17 LAB — RAD ONC ARIA SESSION SUMMARY

## 2022-10-20 ENCOUNTER — Other Ambulatory Visit: Payer: Self-pay

## 2022-10-20 ENCOUNTER — Ambulatory Visit
Admission: RE | Admit: 2022-10-20 | Discharge: 2022-10-20 | Disposition: A | Discharge status: Home / Self Care | Payer: Medicare HMO | Source: Ambulatory Visit | Attending: Radiation Oncology | Admitting: Radiation Oncology

## 2022-10-20 DIAGNOSIS — Z510 Encounter for antineoplastic radiation therapy: Secondary | ICD-10-CM | POA: Diagnosis not present

## 2022-10-20 LAB — RAD ONC ARIA SESSION SUMMARY
Course Elapsed Days: 18
Plan Fractions Treated to Date: 13
Plan Fractions Treated to Date: 13
Plan Prescribed Dose Per Fraction: 1.8 Gy
Plan Prescribed Dose Per Fraction: 1.8 Gy
Plan Total Fractions Prescribed: 28
Plan Total Fractions Prescribed: 28
Plan Total Prescribed Dose: 50.4 Gy
Plan Total Prescribed Dose: 50.4 Gy
Reference Point Dosage Given to Date: 23.4 Gy
Reference Point Dosage Given to Date: 23.4 Gy
Reference Point Session Dosage Given: 1.8 Gy
Reference Point Session Dosage Given: 1.8 Gy
Session Number: 13

## 2022-10-21 ENCOUNTER — Ambulatory Visit
Admission: RE | Admit: 2022-10-21 | Discharge: 2022-10-21 | Disposition: A | Discharge status: Home / Self Care | Payer: Medicare HMO | Source: Ambulatory Visit | Attending: Radiation Oncology | Admitting: Radiation Oncology

## 2022-10-21 ENCOUNTER — Other Ambulatory Visit: Payer: Self-pay

## 2022-10-21 DIAGNOSIS — Z510 Encounter for antineoplastic radiation therapy: Secondary | ICD-10-CM | POA: Diagnosis not present

## 2022-10-21 LAB — RAD ONC ARIA SESSION SUMMARY
Course Elapsed Days: 19
Plan Fractions Treated to Date: 14
Plan Fractions Treated to Date: 14
Plan Prescribed Dose Per Fraction: 1.8 Gy
Plan Prescribed Dose Per Fraction: 1.8 Gy
Plan Total Fractions Prescribed: 28
Plan Total Fractions Prescribed: 28
Plan Total Prescribed Dose: 50.4 Gy
Plan Total Prescribed Dose: 50.4 Gy
Reference Point Dosage Given to Date: 25.2 Gy
Reference Point Dosage Given to Date: 25.2 Gy
Reference Point Session Dosage Given: 1.8 Gy
Reference Point Session Dosage Given: 1.8 Gy
Session Number: 14

## 2022-10-22 ENCOUNTER — Ambulatory Visit
Admission: RE | Admit: 2022-10-22 | Discharge: 2022-10-22 | Disposition: A | Discharge status: Home / Self Care | Payer: Medicare HMO | Source: Ambulatory Visit | Attending: Radiation Oncology | Admitting: Radiation Oncology

## 2022-10-22 ENCOUNTER — Other Ambulatory Visit: Payer: Self-pay

## 2022-10-22 DIAGNOSIS — Z510 Encounter for antineoplastic radiation therapy: Secondary | ICD-10-CM | POA: Diagnosis not present

## 2022-10-22 LAB — RAD ONC ARIA SESSION SUMMARY
Course Elapsed Days: 20
Plan Fractions Treated to Date: 15
Plan Fractions Treated to Date: 15
Plan Prescribed Dose Per Fraction: 1.8 Gy
Plan Prescribed Dose Per Fraction: 1.8 Gy
Plan Total Fractions Prescribed: 28
Plan Total Fractions Prescribed: 28
Plan Total Prescribed Dose: 50.4 Gy
Plan Total Prescribed Dose: 50.4 Gy
Reference Point Dosage Given to Date: 27 Gy
Reference Point Dosage Given to Date: 27 Gy
Reference Point Session Dosage Given: 1.8 Gy
Reference Point Session Dosage Given: 1.8 Gy
Session Number: 15

## 2022-10-23 ENCOUNTER — Ambulatory Visit
Admission: RE | Admit: 2022-10-23 | Discharge: 2022-10-23 | Disposition: A | Discharge status: Home / Self Care | Payer: Medicare HMO | Source: Ambulatory Visit | Attending: Radiation Oncology | Admitting: Radiation Oncology

## 2022-10-23 ENCOUNTER — Other Ambulatory Visit: Payer: Self-pay

## 2022-10-23 DIAGNOSIS — Z510 Encounter for antineoplastic radiation therapy: Secondary | ICD-10-CM | POA: Diagnosis not present

## 2022-10-23 LAB — RAD ONC ARIA SESSION SUMMARY
Course Elapsed Days: 21
Plan Fractions Treated to Date: 16
Plan Fractions Treated to Date: 16
Plan Prescribed Dose Per Fraction: 1.8 Gy
Plan Prescribed Dose Per Fraction: 1.8 Gy
Plan Total Fractions Prescribed: 28
Plan Total Fractions Prescribed: 28
Plan Total Prescribed Dose: 50.4 Gy
Plan Total Prescribed Dose: 50.4 Gy
Reference Point Dosage Given to Date: 28.8 Gy
Reference Point Dosage Given to Date: 28.8 Gy
Reference Point Session Dosage Given: 1.8 Gy
Reference Point Session Dosage Given: 1.8 Gy
Session Number: 16

## 2022-10-24 ENCOUNTER — Ambulatory Visit
Admission: RE | Admit: 2022-10-24 | Discharge: 2022-10-24 | Disposition: A | Discharge status: Home / Self Care | Payer: Medicare HMO | Source: Ambulatory Visit | Attending: Radiation Oncology | Admitting: Radiation Oncology

## 2022-10-24 ENCOUNTER — Other Ambulatory Visit: Payer: Self-pay

## 2022-10-24 DIAGNOSIS — Z510 Encounter for antineoplastic radiation therapy: Secondary | ICD-10-CM | POA: Diagnosis not present

## 2022-10-24 LAB — RAD ONC ARIA SESSION SUMMARY
Course Elapsed Days: 22
Plan Fractions Treated to Date: 17
Plan Fractions Treated to Date: 17
Plan Prescribed Dose Per Fraction: 1.8 Gy
Plan Prescribed Dose Per Fraction: 1.8 Gy
Plan Total Fractions Prescribed: 28
Plan Total Fractions Prescribed: 28
Plan Total Prescribed Dose: 50.4 Gy
Plan Total Prescribed Dose: 50.4 Gy
Reference Point Dosage Given to Date: 30.6 Gy
Reference Point Dosage Given to Date: 30.6 Gy
Reference Point Session Dosage Given: 1.8 Gy
Reference Point Session Dosage Given: 1.8 Gy
Session Number: 17

## 2022-10-27 ENCOUNTER — Other Ambulatory Visit: Payer: Self-pay

## 2022-10-27 ENCOUNTER — Ambulatory Visit
Admission: RE | Admit: 2022-10-27 | Discharge: 2022-10-27 | Disposition: A | Discharge status: Home / Self Care | Payer: Medicare Other | Source: Ambulatory Visit | Attending: Radiation Oncology | Admitting: Radiation Oncology

## 2022-10-27 DIAGNOSIS — Z510 Encounter for antineoplastic radiation therapy: Secondary | ICD-10-CM | POA: Diagnosis not present

## 2022-10-27 DIAGNOSIS — M25612 Stiffness of left shoulder, not elsewhere classified: Secondary | ICD-10-CM | POA: Diagnosis not present

## 2022-10-27 DIAGNOSIS — Z483 Aftercare following surgery for neoplasm: Secondary | ICD-10-CM | POA: Insufficient documentation

## 2022-10-27 DIAGNOSIS — C50412 Malignant neoplasm of upper-outer quadrant of left female breast: Secondary | ICD-10-CM | POA: Insufficient documentation

## 2022-10-27 DIAGNOSIS — R293 Abnormal posture: Secondary | ICD-10-CM | POA: Diagnosis not present

## 2022-10-27 DIAGNOSIS — Z170 Estrogen receptor positive status [ER+]: Secondary | ICD-10-CM | POA: Diagnosis present

## 2022-10-27 LAB — RAD ONC ARIA SESSION SUMMARY
Course Elapsed Days: 25
Plan Fractions Treated to Date: 18
Plan Fractions Treated to Date: 18
Plan Prescribed Dose Per Fraction: 1.8 Gy
Plan Prescribed Dose Per Fraction: 1.8 Gy
Plan Total Fractions Prescribed: 28
Plan Total Fractions Prescribed: 28
Plan Total Prescribed Dose: 50.4 Gy
Plan Total Prescribed Dose: 50.4 Gy
Reference Point Dosage Given to Date: 32.4 Gy
Reference Point Dosage Given to Date: 32.4 Gy
Reference Point Session Dosage Given: 1.8 Gy
Reference Point Session Dosage Given: 1.8 Gy
Session Number: 18

## 2022-10-28 ENCOUNTER — Other Ambulatory Visit: Payer: Self-pay

## 2022-10-28 ENCOUNTER — Ambulatory Visit
Admission: RE | Admit: 2022-10-28 | Discharge: 2022-10-28 | Disposition: A | Discharge status: Home / Self Care | Payer: Medicare Other | Source: Ambulatory Visit | Attending: Radiation Oncology | Admitting: Radiation Oncology

## 2022-10-28 DIAGNOSIS — Z510 Encounter for antineoplastic radiation therapy: Secondary | ICD-10-CM | POA: Diagnosis not present

## 2022-10-28 LAB — RAD ONC ARIA SESSION SUMMARY
Course Elapsed Days: 26
Plan Fractions Treated to Date: 19
Plan Fractions Treated to Date: 19
Plan Prescribed Dose Per Fraction: 1.8 Gy
Plan Prescribed Dose Per Fraction: 1.8 Gy
Plan Total Fractions Prescribed: 28
Plan Total Fractions Prescribed: 28
Plan Total Prescribed Dose: 50.4 Gy
Plan Total Prescribed Dose: 50.4 Gy
Reference Point Dosage Given to Date: 34.2 Gy
Reference Point Dosage Given to Date: 34.2 Gy
Reference Point Session Dosage Given: 1.8 Gy
Reference Point Session Dosage Given: 1.8 Gy
Session Number: 19

## 2022-10-29 ENCOUNTER — Other Ambulatory Visit: Payer: Self-pay

## 2022-10-29 ENCOUNTER — Ambulatory Visit
Admission: RE | Admit: 2022-10-29 | Discharge: 2022-10-29 | Disposition: A | Discharge status: Home / Self Care | Payer: Medicare Other | Source: Ambulatory Visit | Attending: Radiation Oncology | Admitting: Radiation Oncology

## 2022-10-29 DIAGNOSIS — Z510 Encounter for antineoplastic radiation therapy: Secondary | ICD-10-CM | POA: Diagnosis not present

## 2022-10-29 LAB — RAD ONC ARIA SESSION SUMMARY
Course Elapsed Days: 27
Plan Fractions Treated to Date: 20
Plan Fractions Treated to Date: 20
Plan Prescribed Dose Per Fraction: 1.8 Gy
Plan Prescribed Dose Per Fraction: 1.8 Gy
Plan Total Fractions Prescribed: 28
Plan Total Fractions Prescribed: 28
Plan Total Prescribed Dose: 50.4 Gy
Plan Total Prescribed Dose: 50.4 Gy
Reference Point Dosage Given to Date: 36 Gy
Reference Point Dosage Given to Date: 36 Gy
Reference Point Session Dosage Given: 1.8 Gy
Reference Point Session Dosage Given: 1.8 Gy
Session Number: 20

## 2022-10-29 NOTE — Progress Notes (Signed)
Patient outreached by Bettey Mare, PharmD Candidate on 10/29/2022 to discuss hypertension.   Patient has an automated home blood pressure machine. They report home readings of 128/79 mmHg  Medication review was performed. They are taking medications as prescribed.   The following barriers to adherence were noted:  - They do not have cost concerns.  - They do not have transportation concerns.  - They do not need assistance obtaining refills.  - They do not occasionally forget to take some of their prescribed medications.  - They do not feel like one/some of their medications make them feel poorly.  - They do not have questions or concerns about their medications.  - They do have follow up scheduled with their primary care provider/cardiologist.   The following interventions were completed:  - Medications were reviewed  - Patient was educated on goal blood pressures and long term health implications of elevated blood pressure   The patient has follow up scheduled:  PCP: 11/04/2022   Bettey Mare, PharmD Candidate   Joseph Art, Pharm.D. PGY-2 Ambulatory Care Pharmacy Resident

## 2022-10-30 ENCOUNTER — Ambulatory Visit
Admission: RE | Admit: 2022-10-30 | Discharge: 2022-10-30 | Disposition: A | Discharge status: Home / Self Care | Payer: Medicare Other | Source: Ambulatory Visit | Attending: Radiation Oncology | Admitting: Radiation Oncology

## 2022-10-30 ENCOUNTER — Other Ambulatory Visit: Payer: Self-pay

## 2022-10-30 DIAGNOSIS — Z510 Encounter for antineoplastic radiation therapy: Secondary | ICD-10-CM | POA: Diagnosis not present

## 2022-10-30 LAB — RAD ONC ARIA SESSION SUMMARY
Course Elapsed Days: 28
Plan Fractions Treated to Date: 21
Plan Fractions Treated to Date: 21
Plan Prescribed Dose Per Fraction: 1.8 Gy
Plan Prescribed Dose Per Fraction: 1.8 Gy
Plan Total Fractions Prescribed: 28
Plan Total Fractions Prescribed: 28
Plan Total Prescribed Dose: 50.4 Gy
Plan Total Prescribed Dose: 50.4 Gy
Reference Point Dosage Given to Date: 37.8 Gy
Reference Point Dosage Given to Date: 37.8 Gy
Reference Point Session Dosage Given: 1.8 Gy
Reference Point Session Dosage Given: 1.8 Gy
Session Number: 21

## 2022-10-31 ENCOUNTER — Other Ambulatory Visit: Payer: Self-pay

## 2022-10-31 ENCOUNTER — Ambulatory Visit: Admission: RE | Admit: 2022-10-31 | Payer: Medicare Other | Source: Ambulatory Visit

## 2022-10-31 ENCOUNTER — Ambulatory Visit
Admission: RE | Admit: 2022-10-31 | Discharge: 2022-10-31 | Disposition: A | Discharge status: Home / Self Care | Payer: Medicare Other | Source: Ambulatory Visit | Attending: Radiation Oncology | Admitting: Radiation Oncology

## 2022-10-31 DIAGNOSIS — Z510 Encounter for antineoplastic radiation therapy: Secondary | ICD-10-CM | POA: Diagnosis not present

## 2022-10-31 LAB — RAD ONC ARIA SESSION SUMMARY
Course Elapsed Days: 29
Plan Fractions Treated to Date: 22
Plan Fractions Treated to Date: 22
Plan Prescribed Dose Per Fraction: 1.8 Gy
Plan Prescribed Dose Per Fraction: 1.8 Gy
Plan Total Fractions Prescribed: 28
Plan Total Fractions Prescribed: 28
Plan Total Prescribed Dose: 50.4 Gy
Plan Total Prescribed Dose: 50.4 Gy
Reference Point Dosage Given to Date: 39.6 Gy
Reference Point Dosage Given to Date: 39.6 Gy
Reference Point Session Dosage Given: 1.8 Gy
Reference Point Session Dosage Given: 1.8 Gy
Session Number: 22

## 2022-11-03 ENCOUNTER — Other Ambulatory Visit: Payer: Self-pay

## 2022-11-03 ENCOUNTER — Ambulatory Visit
Admission: RE | Admit: 2022-11-03 | Discharge: 2022-11-03 | Disposition: A | Discharge status: Home / Self Care | Payer: Medicare Other | Source: Ambulatory Visit | Attending: Radiation Oncology | Admitting: Radiation Oncology

## 2022-11-03 DIAGNOSIS — Z510 Encounter for antineoplastic radiation therapy: Secondary | ICD-10-CM | POA: Diagnosis not present

## 2022-11-03 LAB — RAD ONC ARIA SESSION SUMMARY
Course Elapsed Days: 32
Plan Fractions Treated to Date: 23
Plan Fractions Treated to Date: 23
Plan Prescribed Dose Per Fraction: 1.8 Gy
Plan Prescribed Dose Per Fraction: 1.8 Gy
Plan Total Fractions Prescribed: 28
Plan Total Fractions Prescribed: 28
Plan Total Prescribed Dose: 50.4 Gy
Plan Total Prescribed Dose: 50.4 Gy
Reference Point Dosage Given to Date: 41.4 Gy
Reference Point Dosage Given to Date: 41.4 Gy
Reference Point Session Dosage Given: 1.8 Gy
Reference Point Session Dosage Given: 1.8 Gy
Session Number: 23

## 2022-11-04 ENCOUNTER — Ambulatory Visit
Admission: RE | Admit: 2022-11-04 | Discharge: 2022-11-04 | Disposition: A | Discharge status: Home / Self Care | Payer: Medicare Other | Source: Ambulatory Visit | Attending: Radiation Oncology | Admitting: Radiation Oncology

## 2022-11-04 ENCOUNTER — Ambulatory Visit (INDEPENDENT_AMBULATORY_CARE_PROVIDER_SITE_OTHER): Payer: Medicare HMO | Admitting: Family Medicine

## 2022-11-04 ENCOUNTER — Encounter: Payer: Self-pay | Admitting: Family Medicine

## 2022-11-04 ENCOUNTER — Other Ambulatory Visit: Payer: Self-pay

## 2022-11-04 VITALS — BP 125/77 | HR 77 | Temp 98.1°F | Resp 16 | Wt 123.6 lb

## 2022-11-04 DIAGNOSIS — K219 Gastro-esophageal reflux disease without esophagitis: Secondary | ICD-10-CM | POA: Diagnosis not present

## 2022-11-04 DIAGNOSIS — Z510 Encounter for antineoplastic radiation therapy: Secondary | ICD-10-CM | POA: Diagnosis not present

## 2022-11-04 DIAGNOSIS — I10 Essential (primary) hypertension: Secondary | ICD-10-CM | POA: Diagnosis not present

## 2022-11-04 LAB — RAD ONC ARIA SESSION SUMMARY
Course Elapsed Days: 33
Plan Fractions Treated to Date: 24
Plan Fractions Treated to Date: 24
Plan Prescribed Dose Per Fraction: 1.8 Gy
Plan Prescribed Dose Per Fraction: 1.8 Gy
Plan Total Fractions Prescribed: 28
Plan Total Fractions Prescribed: 28
Plan Total Prescribed Dose: 50.4 Gy
Plan Total Prescribed Dose: 50.4 Gy
Reference Point Dosage Given to Date: 43.2 Gy
Reference Point Dosage Given to Date: 43.2 Gy
Reference Point Session Dosage Given: 1.8 Gy
Reference Point Session Dosage Given: 1.8 Gy
Session Number: 24

## 2022-11-04 MED ORDER — HYDROCHLOROTHIAZIDE 25 MG PO TABS: 25.0000 mg | ORAL_TABLET | Freq: Every day | ORAL | 1 refills | Status: DC

## 2022-11-04 NOTE — Progress Notes (Signed)
Patient is here for their 6 month follow-up Patient has no concerns today Care gaps have been discussed with patient  

## 2022-11-05 ENCOUNTER — Other Ambulatory Visit: Payer: Self-pay | Admitting: Family Medicine

## 2022-11-05 ENCOUNTER — Ambulatory Visit
Admission: RE | Admit: 2022-11-05 | Discharge: 2022-11-05 | Disposition: A | Discharge status: Home / Self Care | Payer: Medicare Other | Source: Ambulatory Visit | Attending: Radiation Oncology | Admitting: Radiation Oncology

## 2022-11-05 ENCOUNTER — Other Ambulatory Visit: Payer: Self-pay

## 2022-11-05 DIAGNOSIS — Z510 Encounter for antineoplastic radiation therapy: Secondary | ICD-10-CM | POA: Diagnosis not present

## 2022-11-05 DIAGNOSIS — I10 Essential (primary) hypertension: Secondary | ICD-10-CM

## 2022-11-05 LAB — RAD ONC ARIA SESSION SUMMARY
Course Elapsed Days: 34
Plan Fractions Treated to Date: 25
Plan Fractions Treated to Date: 25
Plan Prescribed Dose Per Fraction: 1.8 Gy
Plan Prescribed Dose Per Fraction: 1.8 Gy
Plan Total Fractions Prescribed: 28
Plan Total Fractions Prescribed: 28
Plan Total Prescribed Dose: 50.4 Gy
Plan Total Prescribed Dose: 50.4 Gy
Reference Point Dosage Given to Date: 45 Gy
Reference Point Dosage Given to Date: 45 Gy
Reference Point Session Dosage Given: 1.8 Gy
Reference Point Session Dosage Given: 1.8 Gy
Session Number: 25

## 2022-11-06 ENCOUNTER — Other Ambulatory Visit: Payer: Self-pay

## 2022-11-06 ENCOUNTER — Ambulatory Visit
Admission: RE | Admit: 2022-11-06 | Discharge: 2022-11-06 | Disposition: A | Discharge status: Home / Self Care | Payer: Medicare Other | Source: Ambulatory Visit | Attending: Radiation Oncology | Admitting: Radiation Oncology

## 2022-11-06 DIAGNOSIS — Z510 Encounter for antineoplastic radiation therapy: Secondary | ICD-10-CM | POA: Diagnosis not present

## 2022-11-06 LAB — RAD ONC ARIA SESSION SUMMARY
Course Elapsed Days: 35
Plan Fractions Treated to Date: 26
Plan Fractions Treated to Date: 26
Plan Prescribed Dose Per Fraction: 1.8 Gy
Plan Prescribed Dose Per Fraction: 1.8 Gy
Plan Total Fractions Prescribed: 28
Plan Total Fractions Prescribed: 28
Plan Total Prescribed Dose: 50.4 Gy
Plan Total Prescribed Dose: 50.4 Gy
Reference Point Dosage Given to Date: 46.8 Gy
Reference Point Dosage Given to Date: 46.8 Gy
Reference Point Session Dosage Given: 1.8 Gy
Reference Point Session Dosage Given: 1.8 Gy
Session Number: 26

## 2022-11-07 ENCOUNTER — Encounter: Payer: Self-pay | Admitting: Family Medicine

## 2022-11-07 ENCOUNTER — Ambulatory Visit
Admission: RE | Admit: 2022-11-07 | Discharge: 2022-11-07 | Disposition: A | Discharge status: Home / Self Care | Payer: Medicare Other | Source: Ambulatory Visit | Attending: Radiation Oncology | Admitting: Radiation Oncology

## 2022-11-07 ENCOUNTER — Ambulatory Visit: Payer: Medicare Other | Admitting: Radiation Oncology

## 2022-11-07 ENCOUNTER — Other Ambulatory Visit: Payer: Self-pay

## 2022-11-07 DIAGNOSIS — Z510 Encounter for antineoplastic radiation therapy: Secondary | ICD-10-CM | POA: Diagnosis not present

## 2022-11-07 LAB — RAD ONC ARIA SESSION SUMMARY
Course Elapsed Days: 36
Plan Fractions Treated to Date: 27
Plan Fractions Treated to Date: 27
Plan Prescribed Dose Per Fraction: 1.8 Gy
Plan Prescribed Dose Per Fraction: 1.8 Gy
Plan Total Fractions Prescribed: 28
Plan Total Fractions Prescribed: 28
Plan Total Prescribed Dose: 50.4 Gy
Plan Total Prescribed Dose: 50.4 Gy
Reference Point Dosage Given to Date: 48.6 Gy
Reference Point Dosage Given to Date: 48.6 Gy
Reference Point Session Dosage Given: 1.8 Gy
Reference Point Session Dosage Given: 1.8 Gy
Session Number: 27

## 2022-11-07 NOTE — Progress Notes (Signed)
Established Patient Office Visit  Subjective    Patient ID: Veronica Robles, female    DOB: 04-07-1956  Age: 67 y.o. MRN: [id number]  CC:  Chief Complaint  Patient presents with   Follow-up    HPI [name] presents for routine follow up of chronic med issues. Patient denies acute complaints or concerns.    Outpatient Encounter Medications as of 11/04/2022  Medication Sig   Blood Pressure Monitoring (ADULT BLOOD PRESSURE CUFF LG) KIT     emtricitabine-tenofovir AF (DESCOVY) 200-25 MG tablet Take 1 tablet by mouth daily.   fostemsavir tromethamine (RUKOBIA) 600 MG TB12 ER tablet Take 1 tablet (600 mg total) by mouth 2 (two) times daily.   lidocaine-prilocaine (EMLA) cream Apply to affected area once   ondansetron (ZOFRAN) 8 MG tablet Take 1 tablet (8 mg) by mouth every 8 hours as needed for nausea/vomiting. Start third day after doxorubicin/cyclophosphamide chemotherapy.   prochlorperazine (COMPAZINE) 10 MG tablet Take 1 tablet (10 mg total) by mouth every 6 (six) hours as needed for nausea or vomiting.   SQ injection lenacapavir (SUNLENCA) 463.5 MG/1.5ML SQ injection Inject 3 mLs (927 mg total) into the skin every 6 (six) months. Administer each injection subcutaneously at separate sites in the abdomen (more or equal to 2 inches from the navel).   sulfamethoxazole-trimethoprim (BACTRIM) 200-40 MG/5ML suspension Take 20 mLs by mouth daily.   [DISCONTINUED] hydrochlorothiazide (HYDRODIURIL) 25 MG tablet Take 1 tablet (25 mg total) by mouth daily.   hydrochlorothiazide (HYDRODIURIL) 25 MG tablet Take 1 tablet (25 mg total) by mouth daily.   Facility-Administered Encounter Medications as of 11/04/2022  Medication   ciprofloxacin (CIPRO) IVPB 400 mg    Past Medical History:  Diagnosis Date   Breast cancer 02/2022   left breast IDC with DCIS   Family history of breast cancer 03/20/2022   Family history of pancreatic cancer 03/20/2022   HIV infection 1993   Hypertension      Past Surgical History:  Procedure Laterality Date   ABDOMINAL HYSTERECTOMY     APPENDECTOMY     BREAST BIOPSY Right 2015   BREAST BIOPSY Left 08/26/2022   [place] LT RADIOACTIVE SEED LOC 08/26/2022 [facility]   BREAST BIOPSY  08/26/2022   MM LT RADIOACTIVE SEED LOC MAMMO GUIDE 08/26/2022 [facility]   BREAST LUMPECTOMY WITH RADIOACTIVE SEED LOCALIZATION Left 08/27/2022   Procedure: LEFT BREAST LUMPECTOMY WITH RADIOACTIVE SEED LOCALIZATION;  Surgeon: Abigail Miyamoto, MD;  Location: [facility];  Service: General;  Laterality: Left;   CYSTOSCOPY/RETROGRADE/URETEROSCOPY Right 11/14/2016   Procedure: CYSTOSCOPY/RETROGRADE/URETEROSCOPY/ STONE EXTRACTION/HOLMIUM LASER/STENT PLACEMENT;  Surgeon: Ihor Gully, MD;  Location: [facility];  Service: Urology;  Laterality: Right;   PORT-A-CATH REMOVAL Right 08/27/2022   Procedure: REMOVAL PORT-A-CATH;  Surgeon: Abigail Miyamoto, MD;  Location: [facility];  Service: General;  Laterality: Right;   PORTACATH PLACEMENT Right 03/26/2022   Procedure: INSERTION PORT-A-CATH;  Surgeon: Abigail Miyamoto, MD;  Location: [facility];  Service: General;  Laterality: Right;   RADIOACTIVE SEED GUIDED AXILLARY SENTINEL LYMPH NODE Left 08/27/2022   Procedure: RADIOACTIVE SEED GUIDED LEFT AXILLARY SENTINEL LYMPH NODE DISSECTION;  Surgeon: Abigail Miyamoto, MD;  [facility];  Service: General;  Laterality: Left;    Family History  Problem Relation Age of Onset   Hypertension Mother    Pancreatic cancer Father 69   Breast cancer Sister        dx 53s   Lung cancer Sister  d. 71    Social History   Socioeconomic History   Marital status: Single    Spouse name: Not on file   Number of children: Not on file   Years of education: Not on file   Highest education level: Not on file  Occupational History   Not on file  Tobacco Use   Smoking status: Former    Packs/day: .1    Types:  Cigarettes   Smokeless tobacco: Never  Vaping Use   Vaping Use: Never used  Substance and Sexual Activity   Alcohol use: No    Alcohol/week: 0.0 standard drinks of alcohol   Drug use: No   Sexual activity: Not Currently    Partners: Male    Birth control/protection: Surgical    Comment: declined condoms  Other Topics Concern   Not on file  Social History Narrative   Not on file   Social Determinants of Health   Financial Resource Strain: Low Risk  (08/14/2022)   Overall Financial Resource Strain (CARDIA)    Difficulty of Paying Living Expenses: Not hard at all  Food Insecurity: No Food Insecurity (08/14/2022)   Hunger Vital Sign    Worried About Running Out of Food in the Last Year: Never true    Ran Out of Food in the Last Year: Never true  Transportation Needs: No Transportation Needs (08/14/2022)   PRAPARE - Administrator, Civil Service (Medical): No    Lack of Transportation (Non-Medical): No  Physical Activity: Inactive (08/14/2022)   Exercise Vital Sign    Days of Exercise per Week: 0 days    Minutes of Exercise per Session: 0 min  Stress: No Stress Concern Present (08/14/2022)   [employer] of Occupational Health - Occupational Stress Questionnaire    Feeling of Stress : Not at all  Social Connections: Moderately Integrated (11/04/2022)   Social Connection and Isolation Panel [NHANES]    Frequency of Communication with Friends and Family: More than three times a week    Frequency of Social Gatherings with Friends and Family: More than three times a week    Attends Religious Services: More than 4 times per year    Active Member of [employer] or Organizations: No    Attends Engineer, structural: 1 to 4 times per year    Marital Status: Patient unable to answer  Intimate Partner Violence: Not on file    Review of Systems  All other systems reviewed and are negative.       Objective    BP 125/77   Pulse 77   Temp 98.1 F (36.7 C) (Oral)    Resp 16   Wt 123 lb 9.6 oz (56.1 kg)   SpO2 96%   BMI 22.61 kg/m   Physical Exam Vitals and nursing note reviewed.  Constitutional:      General: She is not in acute distress. Cardiovascular:     Rate and Rhythm: Normal rate and regular rhythm.  Pulmonary:     Effort: Pulmonary effort is normal.     Breath sounds: Normal breath sounds.  Musculoskeletal:     Right lower leg: No edema.     Left lower leg: No edema.  Neurological:     General: No focal deficit present.     Mental Status: She is alert and oriented to person, place, and time.         Assessment & Plan:   1. HYPERTENSION, BENIGN Appears stable. Continue. Meds refilled.  -  hydrochlorothiazide (HYDRODIURIL) 25 MG tablet; Take 1 tablet (25 mg total) by mouth daily.  Dispense: 90 tablet; Refill: 1  2. Gastroesophageal reflux disease without esophagitis Continue     Return in about 6 months (around 05/06/2023) for follow up.   Tommie Raymond, MD

## 2022-11-10 ENCOUNTER — Other Ambulatory Visit: Payer: Self-pay

## 2022-11-10 ENCOUNTER — Ambulatory Visit
Admission: RE | Admit: 2022-11-10 | Discharge: 2022-11-10 | Disposition: A | Discharge status: Home / Self Care | Payer: Medicare Other | Source: Ambulatory Visit | Attending: Radiation Oncology | Admitting: Radiation Oncology

## 2022-11-10 DIAGNOSIS — Z510 Encounter for antineoplastic radiation therapy: Secondary | ICD-10-CM | POA: Diagnosis not present

## 2022-11-10 LAB — RAD ONC ARIA SESSION SUMMARY
Course Elapsed Days: 39
Plan Fractions Treated to Date: 28
Plan Fractions Treated to Date: 28
Plan Prescribed Dose Per Fraction: 1.8 Gy
Plan Prescribed Dose Per Fraction: 1.8 Gy
Plan Total Fractions Prescribed: 28
Plan Total Fractions Prescribed: 28
Plan Total Prescribed Dose: 50.4 Gy
Plan Total Prescribed Dose: 50.4 Gy
Reference Point Dosage Given to Date: 50.4 Gy
Reference Point Dosage Given to Date: 50.4 Gy
Reference Point Session Dosage Given: 1.8 Gy
Reference Point Session Dosage Given: 1.8 Gy
Session Number: 28

## 2022-11-11 ENCOUNTER — Ambulatory Visit
Admission: RE | Admit: 2022-11-11 | Discharge: 2022-11-11 | Disposition: A | Discharge status: Home / Self Care | Payer: Medicare Other | Source: Ambulatory Visit | Attending: Radiation Oncology | Admitting: Radiation Oncology

## 2022-11-11 ENCOUNTER — Other Ambulatory Visit: Payer: Self-pay

## 2022-11-11 DIAGNOSIS — Z510 Encounter for antineoplastic radiation therapy: Secondary | ICD-10-CM | POA: Diagnosis not present

## 2022-11-11 LAB — RAD ONC ARIA SESSION SUMMARY
Course Elapsed Days: 40
Plan Fractions Treated to Date: 1
Plan Prescribed Dose Per Fraction: 2 Gy
Plan Total Fractions Prescribed: 5
Plan Total Prescribed Dose: 10 Gy
Reference Point Dosage Given to Date: 2 Gy
Reference Point Session Dosage Given: 2 Gy
Session Number: 29

## 2022-11-12 ENCOUNTER — Other Ambulatory Visit: Payer: Self-pay

## 2022-11-12 ENCOUNTER — Ambulatory Visit
Admission: RE | Admit: 2022-11-12 | Discharge: 2022-11-12 | Disposition: A | Discharge status: Home / Self Care | Payer: Medicare Other | Source: Ambulatory Visit | Attending: Radiation Oncology | Admitting: Radiation Oncology

## 2022-11-12 DIAGNOSIS — Z510 Encounter for antineoplastic radiation therapy: Secondary | ICD-10-CM | POA: Diagnosis not present

## 2022-11-12 LAB — RAD ONC ARIA SESSION SUMMARY
Course Elapsed Days: 41
Plan Fractions Treated to Date: 2
Plan Prescribed Dose Per Fraction: 2 Gy
Plan Total Fractions Prescribed: 5
Plan Total Prescribed Dose: 10 Gy
Reference Point Dosage Given to Date: 4 Gy
Reference Point Session Dosage Given: 2 Gy
Session Number: 30

## 2022-11-13 ENCOUNTER — Other Ambulatory Visit: Payer: Self-pay

## 2022-11-13 ENCOUNTER — Ambulatory Visit
Admission: RE | Admit: 2022-11-13 | Discharge: 2022-11-13 | Disposition: A | Discharge status: Home / Self Care | Payer: Medicare Other | Source: Ambulatory Visit | Attending: Radiation Oncology | Admitting: Radiation Oncology

## 2022-11-13 DIAGNOSIS — Z510 Encounter for antineoplastic radiation therapy: Secondary | ICD-10-CM | POA: Diagnosis not present

## 2022-11-13 LAB — RAD ONC ARIA SESSION SUMMARY
Course Elapsed Days: 42
Plan Fractions Treated to Date: 3
Plan Prescribed Dose Per Fraction: 2 Gy
Plan Total Fractions Prescribed: 5
Plan Total Prescribed Dose: 10 Gy
Reference Point Dosage Given to Date: 6 Gy
Reference Point Session Dosage Given: 2 Gy
Session Number: 31

## 2022-11-14 ENCOUNTER — Ambulatory Visit
Admission: RE | Admit: 2022-11-14 | Discharge: 2022-11-14 | Disposition: A | Discharge status: Home / Self Care | Payer: Medicare Other | Source: Ambulatory Visit | Attending: Radiation Oncology | Admitting: Radiation Oncology

## 2022-11-14 ENCOUNTER — Other Ambulatory Visit: Payer: Self-pay

## 2022-11-14 DIAGNOSIS — Z510 Encounter for antineoplastic radiation therapy: Secondary | ICD-10-CM | POA: Diagnosis not present

## 2022-11-14 LAB — RAD ONC ARIA SESSION SUMMARY
Course Elapsed Days: 43
Plan Fractions Treated to Date: 4
Plan Prescribed Dose Per Fraction: 2 Gy
Plan Total Fractions Prescribed: 5
Plan Total Prescribed Dose: 10 Gy
Reference Point Dosage Given to Date: 8 Gy
Reference Point Session Dosage Given: 2 Gy
Session Number: 32

## 2022-11-14 NOTE — Progress Notes (Signed)
Patient Care Team: Georganna Skeans, MD as PCP - General (Family Medicine) Judyann Munson, MD as PCP - Infectious Diseases (Infectious Diseases) Donnelly Angelica, RN as Oncology Nurse Navigator Pershing Proud, RN as Oncology Nurse Navigator Serena Croissant, MD as Consulting Physician (Hematology and Oncology)  DIAGNOSIS: No diagnosis found.  SUMMARY OF ONCOLOGIC HISTORY: Oncology History  Malignant neoplasm of upper-outer quadrant of left breast in female, estrogen receptor positive  02/28/2022 Initial Diagnosis   Screening detected left breast mass at 3 o'clock position measuring 0.9 cm, 4 enlarged left axillary lymph nodes along with asymmetric ducts between mass and the nipple possibly DCIS; lymph node biopsy: Positive, breast biopsy: Grade 2 IDC ER 100%, PR 0%, HER2 negative, Ki-67 10%   03/10/2022 Cancer Staging   Staging form: Breast, AJCC 8th Edition - Clinical stage from 03/10/2022: Stage IIA (cT1b, cN1(f), cM0, G2, ER+, PR-, HER2-) - Signed by Ronny Bacon, PA-C on 03/10/2022 Stage prefix: Initial diagnosis Method of lymph node assessment: Core biopsy Histologic grading system: 3 grade system   03/27/2022 - 08/08/2022 Chemotherapy   Patient is on Treatment Plan : BREAST ADJUVANT DOSE DENSE AC q14d / PACLitaxel q7d        CHIEF COMPLIANT: Follow-up after radiation  INTERVAL HISTORY: Veronica Robles is a  67 year old with above-mentioned history of left breast cancer is currently on adjuvant chemotherapy Taxol. She presents to the clinic for a follow-up.    ALLERGIES:  is allergic to penicillins.  MEDICATIONS:  Current Outpatient Medications  Medication Sig Dispense Refill   Blood Pressure Monitoring (ADULT BLOOD PRESSURE CUFF LG) KIT       emtricitabine-tenofovir AF (DESCOVY) 200-25 MG tablet Take 1 tablet by mouth daily. 30 tablet 5   fostemsavir tromethamine (RUKOBIA) 600 MG TB12 ER tablet Take 1 tablet (600 mg total) by mouth 2 (two) times daily. 60 tablet 5    hydrochlorothiazide (HYDRODIURIL) 25 MG tablet Take 1 tablet (25 mg total) by mouth daily. 90 tablet 1   lidocaine-prilocaine (EMLA) cream Apply to affected area once 30 g 3   ondansetron (ZOFRAN) 8 MG tablet Take 1 tablet (8 mg) by mouth every 8 hours as needed for nausea/vomiting. Start third day after doxorubicin/cyclophosphamide chemotherapy. 30 tablet 1   prochlorperazine (COMPAZINE) 10 MG tablet Take 1 tablet (10 mg total) by mouth every 6 (six) hours as needed for nausea or vomiting. 30 tablet 1   SQ injection lenacapavir (SUNLENCA) 463.5 MG/1.5ML SQ injection Inject 3 mLs (927 mg total) into the skin every 6 (six) months. Administer each injection subcutaneously at separate sites in the abdomen (more or equal to 2 inches from the navel). 3 mL 2   sulfamethoxazole-trimethoprim (BACTRIM) 200-40 MG/5ML suspension Take 20 mLs by mouth daily. 473 mL 5   No current facility-administered medications for this visit.   Facility-Administered Medications Ordered in Other Visits  Medication Dose Route Frequency Provider Last Rate Last Admin   ciprofloxacin (CIPRO) IVPB 400 mg  400 mg Intravenous Q12H Ihor Gully, MD   400 mg at 03/26/22 1610    PHYSICAL EXAMINATION: ECOG PERFORMANCE STATUS: {CHL ONC ECOG PS:[phone]}  There were no vitals filed for this visit. There were no vitals filed for this visit.  BREAST:*** No palpable masses or nodules in either right or left breasts. No palpable axillary supraclavicular or infraclavicular adenopathy no breast tenderness or nipple discharge. (exam performed in the presence of a chaperone)  LABORATORY DATA:  I have reviewed the data as listed  Latest Ref Rng & Units 08/08/2022    8:45 AM 08/01/2022    9:48 AM 07/25/2022    9:28 AM  CMP  Glucose 70 - 99 mg/dL 99  696  295   BUN 8 - 23 mg/dL Creatinine 0.44 - 1.00 mg/dL 2.84  1.32  4.40   Sodium 135 - 145 mmol/L 140  142  140   Potassium 3.5 - 5.1 mmol/L 3.9  3.9  3.7   Chloride  98 - 111 mmol/L 108  107  106   CO2 22 - 32 mmol/L Calcium 8.9 - 10.3 mg/dL 9.1  9.4  9.6   Total Protein 6.5 - 8.1 g/dL 5.9  5.6  6.4   Total Bilirubin 0.3 - 1.2 mg/dL 0.3  0.3  0.3   Alkaline Phos 38 - 126 U/L 58  61  58   AST 15 - 41 U/L ALT 0 - 44 U/L Lab Results  Component Value Date   WBC 2.9 (L) 08/08/2022   HGB 10.1 (L) 08/08/2022   HCT 30.2 (L) 08/08/2022   MCV 94.4 08/08/2022   PLT 156 08/08/2022   NEUTROABS 1.6 (L) 08/08/2022    ASSESSMENT & PLAN:  No problem-specific Assessment & Plan notes found for this encounter.    No orders of the defined types were placed in this encounter.  The patient has a good understanding of the overall plan. she agrees with it. she will call with any problems that may develop before the next visit here. Total time spent: 30 mins including face to face time and time spent for planning, charting and co-ordination of care   Sherlyn Lick, CMA 11/14/22    I Janan Ridge am acting as a Neurosurgeon for [employer]  ***

## 2022-11-17 ENCOUNTER — Other Ambulatory Visit: Payer: Self-pay

## 2022-11-17 ENCOUNTER — Ambulatory Visit: Payer: Medicare Other

## 2022-11-17 ENCOUNTER — Ambulatory Visit
Admission: RE | Admit: 2022-11-17 | Discharge: 2022-11-17 | Disposition: A | Discharge status: Home / Self Care | Payer: Medicare Other | Source: Ambulatory Visit | Attending: Radiation Oncology | Admitting: Radiation Oncology

## 2022-11-17 DIAGNOSIS — Z510 Encounter for antineoplastic radiation therapy: Secondary | ICD-10-CM | POA: Diagnosis not present

## 2022-11-17 LAB — RAD ONC ARIA SESSION SUMMARY
Course Elapsed Days: 46
Plan Fractions Treated to Date: 5
Plan Prescribed Dose Per Fraction: 2 Gy
Plan Total Fractions Prescribed: 5
Plan Total Prescribed Dose: 10 Gy
Reference Point Dosage Given to Date: 10 Gy
Reference Point Session Dosage Given: 2 Gy
Session Number: 33

## 2022-11-18 ENCOUNTER — Other Ambulatory Visit: Payer: Self-pay

## 2022-11-18 ENCOUNTER — Other Ambulatory Visit (HOSPITAL_COMMUNITY): Payer: Self-pay

## 2022-11-18 ENCOUNTER — Ambulatory Visit (INDEPENDENT_AMBULATORY_CARE_PROVIDER_SITE_OTHER): Payer: Medicare HMO | Admitting: Internal Medicine

## 2022-11-18 ENCOUNTER — Encounter: Payer: Self-pay | Admitting: Internal Medicine

## 2022-11-18 VITALS — BP 106/70 | HR 76 | Temp 97.8°F | Wt 123.4 lb

## 2022-11-18 DIAGNOSIS — B20 Human immunodeficiency virus [HIV] disease: Secondary | ICD-10-CM | POA: Diagnosis not present

## 2022-11-18 DIAGNOSIS — C50412 Malignant neoplasm of upper-outer quadrant of left female breast: Secondary | ICD-10-CM | POA: Diagnosis not present

## 2022-11-18 DIAGNOSIS — Z79899 Other long term (current) drug therapy: Secondary | ICD-10-CM | POA: Diagnosis not present

## 2022-11-18 DIAGNOSIS — Z170 Estrogen receptor positive status [ER+]: Secondary | ICD-10-CM

## 2022-11-18 MED ORDER — DESCOVY 200-25 MG PO TABS
1.0000 | ORAL_TABLET | Freq: Every day | ORAL | 5 refills | Status: DC
  Filled 2022-11-18 – 2022-11-19 (×2): qty 30, 30d supply, fill #0
  Filled 2022-12-24: qty 30, 30d supply, fill #1
  Filled 2023-01-28: qty 30, 30d supply, fill #2

## 2022-11-18 MED ORDER — RUKOBIA 600 MG PO TB12
600.0000 mg | ORAL_TABLET | Freq: Two times a day (BID) | ORAL | 5 refills | Status: DC
  Filled 2022-11-18 – 2022-11-19 (×2): qty 60, 30d supply, fill #0
  Filled 2022-12-24: qty 60, 30d supply, fill #1
  Filled 2023-01-28: qty 60, 30d supply, fill #2

## 2022-11-18 NOTE — Progress Notes (Signed)
REF: follow up for hiv disease  Patient ID: Veronica Robles, female   DOB: [birth year], 67 y.o.   MRN: [id number]  HPI Veronica Robles is a 67yo F with hiv disease of descovy/rukobia/sulunca injection.  Has last dose of radiation yesterday. Has follow up with oncology tomorrow to hear rest of the plan for breast cancer management.  Hasn't had chemo since end of January. Had breast resection at end of January as well  Has some tenderness nad hyperpigmentation at radiation site  Outpatient Encounter Medications as of 11/18/2022  Medication Sig   Blood Pressure Monitoring (ADULT BLOOD PRESSURE CUFF LG) KIT     emtricitabine-tenofovir AF (DESCOVY) 200-25 MG tablet Take 1 tablet by mouth daily.   fostemsavir tromethamine (RUKOBIA) 600 MG TB12 ER tablet Take 1 tablet (600 mg total) by mouth 2 (two) times daily.   hydrochlorothiazide (HYDRODIURIL) 25 MG tablet Take 1 tablet (25 mg total) by mouth daily.   lidocaine-prilocaine (EMLA) cream Apply to affected area once   ondansetron (ZOFRAN) 8 MG tablet Take 1 tablet (8 mg) by mouth every 8 hours as needed for nausea/vomiting. Start third day after doxorubicin/cyclophosphamide chemotherapy.   prochlorperazine (COMPAZINE) 10 MG tablet Take 1 tablet (10 mg total) by mouth every 6 (six) hours as needed for nausea or vomiting. (Patient not taking: Reported on 11/18/2022)   SQ injection lenacapavir (SUNLENCA) 463.5 MG/1.5ML SQ injection Inject 3 mLs (927 mg total) into the skin every 6 (six) months. Administer each injection subcutaneously at separate sites in the abdomen (more or equal to 2 inches from the navel). (Patient not taking: Reported on 11/18/2022)   sulfamethoxazole-trimethoprim (BACTRIM) 200-40 MG/5ML suspension Take 20 mLs by mouth daily. (Patient not taking: Reported on 11/18/2022)   Facility-Administered Encounter Medications as of 11/18/2022  Medication   ciprofloxacin (CIPRO) IVPB 400 mg     Patient Active Problem List   Diagnosis Date Noted    Port-A-Cath in place 08/08/2022   Family history of breast cancer 03/20/2022   Family history of pancreatic cancer 03/20/2022   Gastroesophageal reflux disease without esophagitis 03/12/2022   Weight loss 03/12/2022   Malignant neoplasm of upper-outer quadrant of left breast in female, estrogen receptor positive 03/07/2022   Dental caries 12/01/2021   Other viral warts 12/01/2021   Healthcare maintenance 11/09/2018   VISUAL IMPAIRMENT 10/01/2007   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   HYPERTENSION, BENIGN 01/26/2007   SHINGLES, RECURRENT 12/08/2006   HIV disease (HCC) 10/26/2006   HX, PERSONAL, PENICILLIN ALLERGY 10/26/2006   HX, PERSONAL, DRUG ALLERGY NOS 10/26/2006     Health Maintenance Due  Topic Date Due   Pneumonia Vaccine 26+ Years old (4 of 4 - PPSV23 or PCV20) 10/14/2022     Review of Systems 12 point ros is negative except what is mentioned above Physical Exam   BP 106/70   Pulse 76   Temp 97.8 F (36.6 C) (Oral)   Wt 123 lb 6.4 oz (56 kg)   BMI 22.57 kg/m   Gen = a xo by 3 in nad Pulm = CTAB no w/c/r Cors = nl s1,s2, no g/m/r Skin =  Left breast incision and left axilla incision is healing. Some hypopigmentation changes Lab Results  Component Value Date   CD4TCELL 17 (L) 08/12/2022   Lab Results  Component Value Date   CD4TABS 128 (L) 08/12/2022   CD4TABS 302 (L) 06/13/2021   CD4TABS 278 (L) 11/01/2020   Lab Results  Component Value Date   HIV1RNAQUANT <20 (H) 08/12/2022   Lab  Results  Component Value Date   HEPBSAB INDETER (A) 08/08/2009   Lab Results  Component Value Date   LABRPR NON-REACTIVE 05/01/2020    CBC Lab Results  Component Value Date   WBC 2.9 (L) 08/08/2022   RBC 3.20 (L) 08/08/2022   HGB 10.1 (L) 08/08/2022   HCT 30.2 (L) 08/08/2022   PLT 156 08/08/2022   MCV 94.4 08/08/2022   MCH 31.6 08/08/2022   MCHC 33.4 08/08/2022   RDW 12.8 08/08/2022   LYMPHSABS 0.9 08/08/2022   MONOABS 0.4 08/08/2022   EOSABS 0.0  08/08/2022    BMET Lab Results  Component Value Date   NA 140 08/08/2022   K 3.9 08/08/2022   CL 108 08/08/2022   CO2 29 08/08/2022   GLUCOSE 99 08/08/2022   BUN 10 08/08/2022   CREATININE 0.74 08/08/2022   CALCIUM 9.1 08/08/2022   GFRNONAA >60 08/08/2022   GFRAA 73 11/01/2020      Assessment and Plan  Hiv disease = will check cd 4 count to see if still needs bactrim oi proph. Will also check VL to see she is still undetectable. Descovy/rukokia/lenacapavir injection.   Breast cancer = "range bell" at radiation clinic with her family. Sees dr Veronica Robles tomorrow for further plan. Appears to be doing well with adjusting to diagnosis  Long term medication management= will check cr function  Will shift descovy and rukobia to [facility] pharmacy who will then send them here to clinic for patient to pick up

## 2022-11-19 ENCOUNTER — Encounter: Payer: Self-pay | Admitting: *Deleted

## 2022-11-19 ENCOUNTER — Other Ambulatory Visit: Payer: Self-pay

## 2022-11-19 ENCOUNTER — Encounter: Payer: Self-pay | Admitting: Hematology and Oncology

## 2022-11-19 ENCOUNTER — Other Ambulatory Visit (HOSPITAL_COMMUNITY): Payer: Self-pay

## 2022-11-19 ENCOUNTER — Inpatient Hospital Stay: Payer: Medicare Other | Attending: Hematology and Oncology | Admitting: Hematology and Oncology

## 2022-11-19 VITALS — BP 138/66 | HR 82 | Temp 97.2°F | Resp 18 | Ht 62.0 in | Wt 124.9 lb

## 2022-11-19 DIAGNOSIS — C50412 Malignant neoplasm of upper-outer quadrant of left female breast: Secondary | ICD-10-CM | POA: Diagnosis not present

## 2022-11-19 DIAGNOSIS — Z170 Estrogen receptor positive status [ER+]: Secondary | ICD-10-CM

## 2022-11-19 DIAGNOSIS — L598 Other specified disorders of the skin and subcutaneous tissue related to radiation: Secondary | ICD-10-CM | POA: Diagnosis not present

## 2022-11-19 DIAGNOSIS — Z880 Allergy status to penicillin: Secondary | ICD-10-CM | POA: Insufficient documentation

## 2022-11-19 DIAGNOSIS — Z79811 Long term (current) use of aromatase inhibitors: Secondary | ICD-10-CM | POA: Insufficient documentation

## 2022-11-19 DIAGNOSIS — Z780 Asymptomatic menopausal state: Secondary | ICD-10-CM | POA: Diagnosis not present

## 2022-11-19 DIAGNOSIS — Z79899 Other long term (current) drug therapy: Secondary | ICD-10-CM | POA: Diagnosis not present

## 2022-11-19 DIAGNOSIS — C773 Secondary and unspecified malignant neoplasm of axilla and upper limb lymph nodes: Secondary | ICD-10-CM | POA: Insufficient documentation

## 2022-11-19 LAB — T-HELPER CELL (CD4) - (RCID CLINIC ONLY)
CD4 % Helper T Cell: 21 % — ABNORMAL LOW (ref 33–65)
CD4 T Cell Abs: 174 /uL — ABNORMAL LOW (ref 400–1790)

## 2022-11-19 MED ORDER — ANASTROZOLE 1 MG PO TABS: 1.0000 mg | ORAL_TABLET | Freq: Every day | ORAL | 3 refills | Status: DC

## 2022-11-19 NOTE — Radiation Completion Notes (Addendum)
  Radiation Oncology         [phone] ________________________________  Name: Veronica Robles MRN: [id number]  Date of Service: 11/17/2022  DOB: 06-13-1956  End of Treatment Note  Diagnosis: Stage IIA, cT1bN1M0, grade 3, ER positive invasive lobular carcinoma of the left breast with ypT1cN1aM0 residual disease following neoadjuvant chemotherapy.    ==========DELIVERED PLANS==========  First Treatment Date: 2022-10-02 - Last Treatment Date: 2022-11-17   Plan Name: Breast_L_BH Site: Breast, Left Technique: 3D Mode: Photon Dose Per Fraction: 1.8 Gy Prescribed Dose (Delivered / Prescribed): 50.4 Gy / 50.4 Gy Prescribed Fxs (Delivered / Prescribed): 28 / 28   Plan Name: Brst_L_SCV_BH Site: Breast, Left Technique: 3D Mode: Photon Dose Per Fraction: 1.8 Gy Prescribed Dose (Delivered / Prescribed): 50.4 Gy / 50.4 Gy Prescribed Fxs (Delivered / Prescribed): 28 / 28   Plan Name: Brst_L_Bst_BH Site: Breast, Left Technique: 3D Mode: Photon Dose Per Fraction: 2 Gy Prescribed Dose (Delivered / Prescribed): 10 Gy / 10 Gy Prescribed Fxs (Delivered / Prescribed): 5 / 5     ==========ON TREATMENT VISIT DATES========== 2022-10-03, 2022-10-10, 2022-10-17, 2022-10-24, 2022-10-31, 2022-11-07, 2022-11-14   See weekly On Treatment Notes is Epic for details.   The patient tolerated radiation. She developed fatigue and anticipated skin changes in the treatment field.   The patient will receive a call in about one month from the radiation oncology department. She will continue follow up with Dr. Pamelia Hoit as well.      Osker Mason, PAC

## 2022-11-19 NOTE — Assessment & Plan Note (Addendum)
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  Neoadjuvant chemotherapy with dose dense Adriamycin and Cytoxan x4 followed by Taxol weekly x12 completed 08/08/2022 2. breast conserving surgery with targeted node dissection 08/27/2022: Left lumpectomy: Grade 2 IDC 1.9 cm, margins negative, 3/4 lymph nodes positive ER 100%, PR 0%, HER2 negative, Ki-67 10% 3.  Adjuvant radiation completed 11/17/2022 4.  Follow-up antiestrogen therapy (we could also evaluate her for clinical trial Wider instead of Verzinio) --------------------------------------------------------------------------------------------------------------------------------- Treatment plan: Adjuvant antiestrogen therapy with anastrozole 1 mg daily x 7 years  Anastrozole counseling: We discussed the risks and benefits of anti-estrogen therapy with aromatase inhibitors. These include but not limited to insomnia, hot flashes, mood changes, vaginal dryness, bone density loss, and weight gain. We strongly believe that the benefits far outweigh the risks. Patient understands these risks and consented to starting treatment. Planned treatment duration is 5-7 years.   Once we have the wider clinical trial open she could be eligible to participate in it with the addition of Ribociclib.

## 2022-11-20 ENCOUNTER — Encounter: Payer: Self-pay | Admitting: Hematology and Oncology

## 2022-11-20 ENCOUNTER — Other Ambulatory Visit: Payer: Self-pay | Admitting: Pharmacist

## 2022-11-20 DIAGNOSIS — B20 Human immunodeficiency virus [HIV] disease: Secondary | ICD-10-CM

## 2022-11-20 LAB — COMPLETE METABOLIC PANEL WITH GFR
AG Ratio: 1.9 (calc) (ref 1.0–2.5)
ALT: 17 U/L (ref 6–29)
AST: 21 U/L (ref 10–35)
Albumin: 4.2 g/dL (ref 3.6–5.1)
Alkaline phosphatase (APISO): 83 U/L (ref 37–153)
BUN: 11 mg/dL (ref 7–25)
CO2: 31 mmol/L (ref 20–32)
Calcium: 9.5 mg/dL (ref 8.6–10.4)
Chloride: 101 mmol/L (ref 98–110)
Creat: 0.79 mg/dL (ref 0.50–1.05)
Globulin: 2.2 g/dL (calc) (ref 1.9–3.7)
Glucose, Bld: 105 mg/dL — ABNORMAL HIGH (ref 65–99)
Potassium: 3.7 mmol/L (ref 3.5–5.3)
Sodium: 140 mmol/L (ref 135–146)
Total Bilirubin: 0.5 mg/dL (ref 0.2–1.2)
Total Protein: 6.4 g/dL (ref 6.1–8.1)
eGFR: 82 mL/min/{1.73_m2} (ref >=60)

## 2022-11-20 LAB — CBC WITH DIFFERENTIAL/PLATELET
Absolute Monocytes: 504 cells/uL (ref 200–950)
Basophils Absolute: 21 cells/uL (ref 0–200)
Basophils Relative: 0.5 %
Eosinophils Absolute: 82 cells/uL (ref 15–500)
Eosinophils Relative: 2 %
HCT: 37.9 % (ref 35.0–45.0)
Hemoglobin: 12.3 g/dL (ref 11.7–15.5)
Lymphs Abs: 853 cells/uL (ref 850–3900)
MCH: 27.8 pg (ref 27.0–33.0)
MCHC: 32.5 g/dL (ref 32.0–36.0)
MCV: 85.7 fL (ref 80.0–100.0)
MPV: 9.6 fL (ref 7.5–12.5)
Monocytes Relative: 12.3 %
Neutro Abs: 2640 cells/uL (ref 1500–7800)
Neutrophils Relative %: 64.4 %
Platelets: 166 10*3/uL (ref 140–400)
RBC: 4.42 10*6/uL (ref 3.80–5.10)
RDW: 12.8 % (ref 11.0–15.0)
Total Lymphocyte: 20.8 %
WBC: 4.1 10*3/uL (ref 3.8–10.8)

## 2022-11-20 LAB — HIV-1 RNA QUANT-NO REFLEX-BLD
HIV 1 RNA Quant: <20 Copies/mL — ABNORMAL HIGH
HIV-1 RNA Quant, Log: <1.3 Log cps/mL — ABNORMAL HIGH

## 2022-11-20 MED ORDER — DESCOVY 200-25 MG PO TABS: 1.0000 | ORAL_TABLET | Freq: Every day | ORAL | 0 refills | Status: AC

## 2022-11-20 NOTE — Progress Notes (Signed)
Medication Samples have been provided to the patient.  Drug name: Descovy     Strength: 200/25 mg    Qty: 14 tablets (2 blister packs) LOT: [id number]   Exp.Date: 3/25  Dosing instructions: Take one tablet by mouth once daily.   The patient has been instructed regarding the correct time, dose, and frequency of taking this medication, including desired effects and most common side effects.   Margarite Gouge, PharmD, CPP, BCIDP, AAHIVP Clinical Pharmacist Practitioner Infectious Diseases Clinical Pharmacist [facility] for Infectious Disease

## 2022-11-23 ENCOUNTER — Encounter: Payer: Self-pay | Admitting: Hematology and Oncology

## 2022-11-25 ENCOUNTER — Ambulatory Visit (HOSPITAL_BASED_OUTPATIENT_CLINIC_OR_DEPARTMENT_OTHER)
Admission: RE | Admit: 2022-11-25 | Discharge: 2022-11-25 | Disposition: A | Discharge status: Home / Self Care | Payer: Medicare Other | Source: Ambulatory Visit | Attending: Hematology and Oncology | Admitting: Hematology and Oncology

## 2022-11-25 DIAGNOSIS — Z780 Asymptomatic menopausal state: Secondary | ICD-10-CM | POA: Insufficient documentation

## 2022-11-30 ENCOUNTER — Encounter: Payer: Self-pay | Admitting: Hematology and Oncology

## 2022-12-03 ENCOUNTER — Other Ambulatory Visit: Payer: Self-pay

## 2022-12-04 ENCOUNTER — Other Ambulatory Visit: Payer: Self-pay

## 2022-12-04 ENCOUNTER — Telehealth: Payer: Self-pay

## 2022-12-04 NOTE — Telephone Encounter (Signed)
[facility] Patient Advocate Encounter  Patient's medications have been couriered to [facility] from [facility] and will be picked up .  Rukobia [facility] Descovy  Clearance Coots , CPhT Specialty Pharmacy Patient [facility] for Infectious Disease Phone: [phone] Fax:  [phone]

## 2022-12-07 ENCOUNTER — Other Ambulatory Visit: Payer: Self-pay | Admitting: Pharmacist

## 2022-12-07 DIAGNOSIS — B20 Human immunodeficiency virus [HIV] disease: Secondary | ICD-10-CM

## 2022-12-10 ENCOUNTER — Other Ambulatory Visit: Payer: Self-pay | Admitting: Pharmacist

## 2022-12-10 DIAGNOSIS — B20 Human immunodeficiency virus [HIV] disease: Secondary | ICD-10-CM

## 2022-12-15 ENCOUNTER — Ambulatory Visit: Payer: Medicare Other | Attending: Surgery

## 2022-12-15 VITALS — Wt 124.2 lb

## 2022-12-15 DIAGNOSIS — Z483 Aftercare following surgery for neoplasm: Secondary | ICD-10-CM

## 2022-12-15 NOTE — Therapy (Signed)
OUTPATIENT PHYSICAL THERAPY SOZO SCREENING NOTE   Patient Name: Veronica Robles MRN: [id number] DOB:29-Sep-1955, 67 y.o., female Today's Date: 12/15/2022  PCP: Georganna Skeans, MD REFERRING PROVIDER: Abigail Miyamoto, MD   PT End of Session - 12/15/22 1518     Visit Number 7   # unchanged due to screen only   PT Start Time 1515    PT Stop Time 1520    PT Time Calculation (min) 5 min    Activity Tolerance Patient tolerated treatment well    Behavior During Therapy [facility] for tasks assessed/performed             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   Family history of breast cancer 03/20/2022   Family history of pancreatic cancer 03/20/2022   HIV infection (HCC) 1993   Hypertension    Past Surgical History:  Procedure Laterality Date   ABDOMINAL HYSTERECTOMY     APPENDECTOMY     BREAST BIOPSY Right 2015   BREAST BIOPSY Left 08/26/2022   [place] LT RADIOACTIVE SEED LOC 08/26/2022 [facility] MAMMOGRAPHY   BREAST BIOPSY  08/26/2022   MM LT RADIOACTIVE SEED LOC MAMMO GUIDE 08/26/2022 [facility] MAMMOGRAPHY   BREAST LUMPECTOMY WITH RADIOACTIVE SEED LOCALIZATION Left 08/27/2022   Procedure: LEFT BREAST LUMPECTOMY WITH RADIOACTIVE SEED LOCALIZATION;  Surgeon: Abigail Miyamoto, MD;  Location: [facility];  Service: General;  Laterality: Left;   CYSTOSCOPY/RETROGRADE/URETEROSCOPY Right 11/14/2016   Procedure: CYSTOSCOPY/RETROGRADE/URETEROSCOPY/ STONE EXTRACTION/HOLMIUM LASER/STENT PLACEMENT;  Surgeon: Ihor Gully, MD;  Location: [facility];  Service: Urology;  Laterality: Right;   PORT-A-CATH REMOVAL Right 08/27/2022   Procedure: REMOVAL PORT-A-CATH;  Surgeon: Abigail Miyamoto, MD;  Location: [facility];  Service: General;  Laterality: Right;   PORTACATH PLACEMENT Right 03/26/2022   Procedure: INSERTION PORT-A-CATH;  Surgeon: Abigail Miyamoto, MD;  Location: [facility];  Service: General;  Laterality:  Right;   RADIOACTIVE SEED GUIDED AXILLARY SENTINEL LYMPH NODE Left 08/27/2022   Procedure: RADIOACTIVE SEED GUIDED LEFT AXILLARY SENTINEL LYMPH NODE DISSECTION;  Surgeon: Abigail Miyamoto, MD;  Location: [facility];  Service: General;  Laterality: Left;   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  3 of 4 lymph nodes positive. Neoadjuvant chemotherapy completed and will be having radiation. It is ER positive, PR negative, and HER2 negative with a Ki67 of 10%. She has a positive axillary node. She also has HIV disease which she has not shared with her family.   PRECAUTIONS: left UE Lymphedema risk, None  SUBJECTIVE: Pt returns for her first 3 month L-Dex screen.   PAIN:  Are you having pain? No  SOZO SCREENING: Patient was assessed today using the SOZO machine to determine the lymphedema index score. This was compared to her baseline score. It was determined that she is within the recommended range when compared to her baseline and no further action is needed at this time. She will continue SOZO screenings. These are done  every 3 months for 2 years post operatively followed by every 6 months for 2 years, and then annually.   L-DEX FLOWSHEETS - 12/15/22 1500       L-DEX LYMPHEDEMA SCREENING   Measurement Type Unilateral    L-DEX MEASUREMENT EXTREMITY Upper Extremity    POSITION  Standing    DOMINANT SIDE Right    At Risk Side Left    BASELINE SCORE (UNILATERAL) 0.2    L-DEX SCORE (UNILATERAL) 2.5    VALUE CHANGE (UNILAT) 2.3               Hermenia Bers, PTA 12/15/2022, 3:19 PM

## 2022-12-23 ENCOUNTER — Other Ambulatory Visit (HOSPITAL_COMMUNITY): Payer: Self-pay

## 2022-12-24 ENCOUNTER — Other Ambulatory Visit (HOSPITAL_COMMUNITY): Payer: Self-pay

## 2022-12-24 ENCOUNTER — Other Ambulatory Visit: Payer: Self-pay

## 2023-01-01 ENCOUNTER — Other Ambulatory Visit (HOSPITAL_COMMUNITY): Payer: Self-pay

## 2023-01-05 ENCOUNTER — Encounter: Payer: Self-pay | Admitting: Hematology and Oncology

## 2023-01-05 ENCOUNTER — Telehealth: Payer: Self-pay

## 2023-01-05 NOTE — Telephone Encounter (Signed)
[facility] Patient Advocate Encounter  Patient's medications have been couriered to [facility] from [facility] pharmacy and will be picked up 01/05/23.  Rukobia & Descovy  Clearance Coots , CPhT Specialty Pharmacy Patient [facility] for Infectious Disease Phone: [phone] Fax:  [phone]

## 2023-01-07 ENCOUNTER — Other Ambulatory Visit: Payer: Self-pay

## 2023-01-07 DIAGNOSIS — B20 Human immunodeficiency virus [HIV] disease: Secondary | ICD-10-CM

## 2023-01-07 DIAGNOSIS — Z113 Encounter for screening for infections with a predominantly sexual mode of transmission: Secondary | ICD-10-CM

## 2023-01-07 DIAGNOSIS — Z79899 Other long term (current) drug therapy: Secondary | ICD-10-CM

## 2023-01-07 NOTE — Progress Notes (Signed)
ASCVD Risk Score: 12.1% Sex: Female Race: African American  Age: 67 Total Cholesterol (mg/dL) 161 HDL Cholesterol (mg/dL) 51 LDL Cholesterol (mg/dL) 096 Systolic Blood Pressure (mm Hg) 138 Diastolic Blood Pressure (mm Hg) 66 Diabetes: No Smoker: Former Treatment for Hypertension: Yes Aspirin Therapy: No Statin: No  Sandie Ano, RN

## 2023-01-12 ENCOUNTER — Ambulatory Visit
Admission: RE | Admit: 2023-01-12 | Discharge: 2023-01-12 | Disposition: A | Discharge status: Home / Self Care | Payer: Medicare Other | Source: Ambulatory Visit | Attending: Radiation Oncology | Admitting: Radiation Oncology

## 2023-01-12 NOTE — Progress Notes (Signed)
  Radiation Oncology         [phone] ________________________________  Name: Veronica Robles MRN: [id number]  Date of Service: 01/12/2023  DOB: [birth year]  Post Treatment Telephone Note  Diagnosis:  Stage IIA, cT1bN1M0, grade 3, ER positive invasive lobular carcinoma of the left breast with ypT1cN1aM0 residual disease following neoadjuvant chemotherapy. (as documented in provider EOT note)  The patient was not available for call today. Voicemail left.  The patient was encouraged to avoid sun exposure in the area of prior treatment for up to one year following radiation with either sunscreen or by the style of clothing worn in the sun.  The patient has scheduled follow up with her medical oncologist Dr. Pamelia Hoit for ongoing surveillance, and was encouraged to call if she develops concerns or questions regarding radiation.   Ruel Favors, LPN

## 2023-01-14 ENCOUNTER — Encounter: Payer: Self-pay | Admitting: Hematology and Oncology

## 2023-01-26 ENCOUNTER — Encounter: Payer: Self-pay | Admitting: Hematology and Oncology

## 2023-01-28 ENCOUNTER — Other Ambulatory Visit (HOSPITAL_COMMUNITY): Payer: Self-pay

## 2023-01-30 ENCOUNTER — Other Ambulatory Visit (HOSPITAL_COMMUNITY): Payer: Self-pay

## 2023-02-02 ENCOUNTER — Telehealth: Payer: Self-pay

## 2023-02-02 NOTE — Telephone Encounter (Signed)
[facility] Patient Advocate Encounter  Patient's medications have been couriered to [facility] from [facility] Special[facility] pharmacy and will be picked up 02/03/23.  Descovy & [name]  Clearance Coots , CPhT Specialty Pharmacy Patient [facility] for Infectious Disease Phone: [phone] Fax:  [phone]

## 2023-02-03 ENCOUNTER — Other Ambulatory Visit: Payer: Self-pay

## 2023-02-03 ENCOUNTER — Other Ambulatory Visit: Payer: Medicare Other

## 2023-02-03 ENCOUNTER — Ambulatory Visit: Payer: Medicare Other

## 2023-02-03 ENCOUNTER — Other Ambulatory Visit (HOSPITAL_COMMUNITY)
Admission: RE | Admit: 2023-02-03 | Discharge: 2023-02-03 | Disposition: A | Discharge status: Home / Self Care | Payer: Medicare Other | Source: Ambulatory Visit | Attending: Internal Medicine | Admitting: Internal Medicine

## 2023-02-03 DIAGNOSIS — B20 Human immunodeficiency virus [HIV] disease: Secondary | ICD-10-CM

## 2023-02-03 DIAGNOSIS — Z79899 Other long term (current) drug therapy: Secondary | ICD-10-CM

## 2023-02-03 DIAGNOSIS — Z113 Encounter for screening for infections with a predominantly sexual mode of transmission: Secondary | ICD-10-CM | POA: Insufficient documentation

## 2023-02-04 ENCOUNTER — Telehealth: Payer: Self-pay | Admitting: Adult Health

## 2023-02-04 ENCOUNTER — Encounter: Payer: Self-pay | Admitting: Hematology and Oncology

## 2023-02-04 LAB — T-HELPER CELL (CD4) - (RCID CLINIC ONLY)
CD4 % Helper T Cell: 18 % — ABNORMAL LOW (ref 33–65)
CD4 T Cell Abs: 218 /uL — ABNORMAL LOW (ref 400–1790)

## 2023-02-04 LAB — URINE CYTOLOGY ANCILLARY ONLY
Chlamydia: NEGATIVE
Comment: NEGATIVE
Comment: NORMAL
Neisseria Gonorrhea: NEGATIVE

## 2023-02-04 NOTE — Telephone Encounter (Signed)
Rescheduled appointment per room/resource. Left voicemail for patient. 

## 2023-02-06 ENCOUNTER — Encounter: Payer: Self-pay | Admitting: Hematology and Oncology

## 2023-02-06 LAB — CBC WITH DIFFERENTIAL/PLATELET
Absolute Monocytes: 551 cells/uL (ref 200–950)
Basophils Absolute: 11 cells/uL (ref 0–200)
Basophils Relative: 0.2 %
Eosinophils Absolute: 70 cells/uL (ref 15–500)
Eosinophils Relative: 1.3 %
HCT: 37.7 % (ref 35.0–45.0)
Hemoglobin: 12.3 g/dL (ref 11.7–15.5)
Lymphs Abs: 1258 cells/uL (ref 850–3900)
MCH: 28 pg (ref 27.0–33.0)
MCHC: 32.6 g/dL (ref 32.0–36.0)
MCV: 85.9 fL (ref 80.0–100.0)
MPV: 10 fL (ref 7.5–12.5)
Monocytes Relative: 10.2 %
Neutro Abs: 3510 cells/uL (ref 1500–7800)
Neutrophils Relative %: 65 %
Platelets: 145 10*3/uL (ref 140–400)
RBC: 4.39 10*6/uL (ref 3.80–5.10)
RDW: 14.1 % (ref 11.0–15.0)
Total Lymphocyte: 23.3 %
WBC: 5.4 10*3/uL (ref 3.8–10.8)

## 2023-02-06 LAB — COMPLETE METABOLIC PANEL WITH GFR
AG Ratio: 1.8 (calc) (ref 1.0–2.5)
ALT: 15 U/L (ref 6–29)
AST: 20 U/L (ref 10–35)
Albumin: 4.3 g/dL (ref 3.6–5.1)
Alkaline phosphatase (APISO): 77 U/L (ref 37–153)
BUN: 10 mg/dL (ref 7–25)
CO2: 29 mmol/L (ref 20–32)
Calcium: 9.8 mg/dL (ref 8.6–10.4)
Chloride: 101 mmol/L (ref 98–110)
Creat: 0.78 mg/dL (ref 0.50–1.05)
Globulin: 2.4 g/dL (calc) (ref 1.9–3.7)
Glucose, Bld: 93 mg/dL (ref 65–99)
Potassium: 3.9 mmol/L (ref 3.5–5.3)
Sodium: 140 mmol/L (ref 135–146)
Total Bilirubin: 0.4 mg/dL (ref 0.2–1.2)
Total Protein: 6.7 g/dL (ref 6.1–8.1)
eGFR: 83 mL/min/{1.73_m2} (ref >=60)

## 2023-02-06 LAB — HIV-1 RNA QUANT-NO REFLEX-BLD
HIV 1 RNA Quant: NOT DETECTED Copies/mL
HIV-1 RNA Quant, Log: NOT DETECTED Log cps/mL

## 2023-02-06 LAB — LIPID PANEL
Cholesterol: 209 mg/dL — ABNORMAL HIGH (ref <200)
HDL: 48 mg/dL — ABNORMAL LOW (ref >=50)
LDL Cholesterol (Calc): 124 mg/dL (calc) — ABNORMAL HIGH
Non-HDL Cholesterol (Calc): 161 mg/dL (calc) — ABNORMAL HIGH (ref <130)
Total CHOL/HDL Ratio: 4.4 (calc) (ref <5.0)
Triglycerides: 220 mg/dL — ABNORMAL HIGH (ref <150)

## 2023-02-06 LAB — RPR: RPR Ser Ql: NONREACTIVE

## 2023-02-12 ENCOUNTER — Other Ambulatory Visit: Payer: Self-pay

## 2023-02-12 ENCOUNTER — Encounter: Payer: Self-pay | Admitting: Internal Medicine

## 2023-02-12 ENCOUNTER — Ambulatory Visit (INDEPENDENT_AMBULATORY_CARE_PROVIDER_SITE_OTHER): Payer: Medicare Other | Admitting: Internal Medicine

## 2023-02-12 DIAGNOSIS — B20 Human immunodeficiency virus [HIV] disease: Secondary | ICD-10-CM

## 2023-02-12 MED ORDER — RUKOBIA 600 MG PO TB12
600.0000 mg | ORAL_TABLET | Freq: Two times a day (BID) | ORAL | 5 refills | Status: DC
Start: 2023-02-12 — End: 2023-03-02

## 2023-02-12 MED ORDER — DESCOVY 200-25 MG PO TABS
1.0000 | ORAL_TABLET | Freq: Every day | ORAL | 5 refills | Status: DC
Start: 2023-02-12 — End: 2023-03-02

## 2023-02-12 NOTE — Progress Notes (Signed)
RFV: follow up for hiv disease  Patient ID: Veronica Robles, female   DOB: 07-Apr-1956, 67 y.o.   MRN: [id number]  HPI yaritzi craun 75IE F with drug resistant hiv disease, CD 4 count 218/VL<20 (July 2024) take descovy and rukobia twice a day. Only missed 2 doses since the last visit. And sunlenca inj.  She has not disclosed to her family about her status.  Due for next sunlenca until sept 2024.   Also treated for Malignant neoplasm of upper-outer quadrant of left breast in female, estrogen receptor positive (HCC) . She is on arimidex.  Outpatient Encounter Medications as of 02/12/2023  Medication Sig   anastrozole (ARIMIDEX) 1 MG tablet Take 1 tablet (1 mg total) by mouth daily.   Blood Pressure Monitoring (ADULT BLOOD PRESSURE CUFF LG) KIT     emtricitabine-tenofovir AF (DESCOVY) 200-25 MG tablet Take 1 tablet by mouth daily.   fostemsavir tromethamine (RUKOBIA) 600 MG TB12 ER tablet Take 1 tablet (600 mg total) by mouth 2 (two) times daily.   hydrochlorothiazide (HYDRODIURIL) 25 MG tablet Take 1 tablet (25 mg total) by mouth daily.   lidocaine-prilocaine (EMLA) cream Apply to affected area once (Patient not taking: Reported on 02/12/2023)   ondansetron (ZOFRAN) 8 MG tablet Take 1 tablet (8 mg) by mouth every 8 hours as needed for nausea/vomiting. Start third day after doxorubicin/cyclophosphamide chemotherapy. (Patient not taking: Reported on 02/12/2023)   prochlorperazine (COMPAZINE) 10 MG tablet Take 1 tablet (10 mg total) by mouth every 6 (six) hours as needed for nausea or vomiting. (Patient not taking: Reported on 11/18/2022)   SQ injection lenacapavir (SUNLENCA) 463.5 MG/1.5ML SQ injection Inject 3 mLs (927 mg total) into the skin every 6 (six) months. Administer each injection subcutaneously at separate sites in the abdomen (more or equal to 2 inches from the navel). (Patient not taking: Reported on 11/18/2022)   sulfamethoxazole-trimethoprim (BACTRIM) 200-40 MG/5ML suspension Take 20 mLs  by mouth daily. (Patient not taking: Reported on 11/18/2022)   Facility-Administered Encounter Medications as of 02/12/2023  Medication   ciprofloxacin (CIPRO) IVPB 400 mg     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  HENT: Negative for congestion, sore throat, rhinorrhea, sneezing, trouble swallowing and sinus pressure.  Eyes: Negative for photophobia and visual disturbance.  Respiratory: Negative for cough, chest tightness, shortness of breath, wheezing and stridor.  Cardiovascular: Negative for chest pain, palpitations and leg swelling.  Gastrointestinal: Negative for nausea, vomiting, abdominal pain, diarrhea, constipation, blood in stool, abdominal  distention and anal bleeding.  Genitourinary: Negative for dysuria, hematuria, flank pain and difficulty urinating.  Musculoskeletal: Negative for myalgias, back pain, joint swelling, arthralgias and gait problem.   Skin: Negative for color change, pallor, rash and wound.  Neurological: Negative for dizziness, tremors, weakness and light-headedness.  Hematological: Negative for adenopathy. Does not bruise/bleed easily.  Psychiatric/Behavioral: Negative for behavioral problems, confusion, sleep disturbance, dysphoric mood, decreased concentration and agitation.   Physical Exam   BP 114/71   Pulse 79   Temp 98.5 F (36.9 C) (Oral)   Resp 16   Wt 126 lb (57.2 kg)   SpO2 100%   BMI 23.05 kg/m   Physical Exam  Constitutional:  oriented to person, place, and time. appears well-developed and well-nourished. No distress.  HENT: [place]/AT, PERRLA, no scleral icterus Mouth/Throat: Oropharynx is clear and moist. No oropharyngeal exudate.  Cardiovascular: Normal rate, regular rhythm and normal heart sounds. Exam reveals no gallop and no friction rub.  No murmur heard.  Pulmonary/Chest: Effort normal and breath sounds normal. No respiratory distress.  has no wheezes.  Neck = supple, no nuchal rigidity Abdominal: Soft. Bowel sounds are normal.  exhibits no distension. There is no tenderness.  Lymphadenopathy: no cervical adenopathy. No axillary adenopathy Neurological: alert and oriented to person, place, and time.  Skin: Skin is warm and dry. No rash noted. No erythema.  Psychiatric: a normal mood and affect.  behavior is normal.   Lab Results  Component Value Date   CD4TCELL 18 (L) 02/03/2023   Lab Results  Component Value Date   CD4TABS 218 (L) 02/03/2023   CD4TABS 174 (L) 11/18/2022   CD4TABS 128 (L) 08/12/2022   Lab Results  Component Value Date   HIV1RNAQUANT Not Detected 02/03/2023   Lab Results  Component Value Date   HEPBSAB INDETER (A) 08/08/2009   Lab Results  Component Value Date   LABRPR NON-REACTIVE 02/03/2023    CBC Lab Results  Component Value Date   WBC 5.4 02/03/2023   RBC 4.39 02/03/2023   HGB 12.3 02/03/2023   HCT 37.7 02/03/2023   PLT 145 02/03/2023   MCV 85.9  02/03/2023   MCH 28.0 02/03/2023   MCHC 32.6 02/03/2023   RDW 14.1 02/03/2023   LYMPHSABS 1,258 02/03/2023   MONOABS 0.4 08/08/2022   EOSABS 70 02/03/2023    BMET Lab Results  Component Value Date   NA 140 02/03/2023   K 3.9 02/03/2023   CL 101 02/03/2023   CO2 29 02/03/2023   GLUCOSE 93 02/03/2023   BUN 10 02/03/2023   CREATININE 0.78 02/03/2023   CALCIUM 9.8 02/03/2023   GFRNONAA >60 08/08/2022   GFRAA 73 11/01/2020      Assessment and Plan   Health maintenance = flu and covid in the Fall  Hiv disease = well controlled. Continue on current regimen will give refills for rukobia and descovy. She has upcoming sunlenca injection mid september  Long term medication management = cr is stable  Breast ca = continue on current regimen and follows up with dr. Pamelia Hoit to possible change her regimen

## 2023-02-16 ENCOUNTER — Other Ambulatory Visit: Payer: Self-pay

## 2023-02-17 ENCOUNTER — Encounter: Payer: Self-pay | Admitting: Internal Medicine

## 2023-02-18 ENCOUNTER — Other Ambulatory Visit: Payer: Self-pay

## 2023-02-23 ENCOUNTER — Encounter: Payer: Medicare HMO | Admitting: Adult Health

## 2023-02-23 ENCOUNTER — Inpatient Hospital Stay: Payer: Medicare Other | Attending: Adult Health | Admitting: Adult Health

## 2023-02-23 ENCOUNTER — Encounter: Payer: Self-pay | Admitting: Adult Health

## 2023-02-23 ENCOUNTER — Other Ambulatory Visit: Payer: Self-pay

## 2023-02-23 ENCOUNTER — Telehealth: Payer: Self-pay | Admitting: Adult Health

## 2023-02-23 VITALS — BP 142/84 | HR 68 | Temp 98.1°F | Resp 16 | Wt 127.2 lb

## 2023-02-23 DIAGNOSIS — Z9049 Acquired absence of other specified parts of digestive tract: Secondary | ICD-10-CM | POA: Diagnosis not present

## 2023-02-23 DIAGNOSIS — N6489 Other specified disorders of breast: Secondary | ICD-10-CM | POA: Diagnosis not present

## 2023-02-23 DIAGNOSIS — Z800 Family history of malignant neoplasm of digestive organs: Secondary | ICD-10-CM | POA: Diagnosis not present

## 2023-02-23 DIAGNOSIS — Z87891 Personal history of nicotine dependence: Secondary | ICD-10-CM | POA: Diagnosis not present

## 2023-02-23 DIAGNOSIS — Z8249 Family history of ischemic heart disease and other diseases of the circulatory system: Secondary | ICD-10-CM | POA: Diagnosis not present

## 2023-02-23 DIAGNOSIS — Z803 Family history of malignant neoplasm of breast: Secondary | ICD-10-CM | POA: Insufficient documentation

## 2023-02-23 DIAGNOSIS — Z79811 Long term (current) use of aromatase inhibitors: Secondary | ICD-10-CM | POA: Diagnosis not present

## 2023-02-23 DIAGNOSIS — N6325 Unspecified lump in the left breast, overlapping quadrants: Secondary | ICD-10-CM | POA: Insufficient documentation

## 2023-02-23 DIAGNOSIS — Z90710 Acquired absence of both cervix and uterus: Secondary | ICD-10-CM | POA: Diagnosis not present

## 2023-02-23 DIAGNOSIS — M85852 Other specified disorders of bone density and structure, left thigh: Secondary | ICD-10-CM | POA: Diagnosis not present

## 2023-02-23 DIAGNOSIS — Z923 Personal history of irradiation: Secondary | ICD-10-CM | POA: Diagnosis not present

## 2023-02-23 DIAGNOSIS — Z79899 Other long term (current) drug therapy: Secondary | ICD-10-CM | POA: Insufficient documentation

## 2023-02-23 DIAGNOSIS — I10 Essential (primary) hypertension: Secondary | ICD-10-CM | POA: Diagnosis not present

## 2023-02-23 DIAGNOSIS — C50412 Malignant neoplasm of upper-outer quadrant of left female breast: Secondary | ICD-10-CM | POA: Insufficient documentation

## 2023-02-23 DIAGNOSIS — Z79624 Long term (current) use of inhibitors of nucleotide synthesis: Secondary | ICD-10-CM | POA: Diagnosis not present

## 2023-02-23 DIAGNOSIS — Z880 Allergy status to penicillin: Secondary | ICD-10-CM | POA: Insufficient documentation

## 2023-02-23 DIAGNOSIS — Z170 Estrogen receptor positive status [ER+]: Secondary | ICD-10-CM | POA: Insufficient documentation

## 2023-02-23 DIAGNOSIS — Z801 Family history of malignant neoplasm of trachea, bronchus and lung: Secondary | ICD-10-CM | POA: Diagnosis not present

## 2023-02-23 MED ORDER — RADIAPLEXRX EX GEL: 1.0000 | Freq: Once | CUTANEOUS | 0 refills | Status: AC

## 2023-02-23 NOTE — Telephone Encounter (Signed)
Scheduled appointments per 7/29 los. Left voicemail for patient.

## 2023-02-23 NOTE — Progress Notes (Unsigned)
SURVIVORSHIP VISIT:  BRIEF ONCOLOGIC HISTORY:  Oncology History  Malignant neoplasm of upper-outer quadrant of left breast in female, estrogen receptor positive (HCC)  02/28/2022 Initial Diagnosis   Screening detected left breast mass at 3 o'clock position measuring 0.9 cm, 4 enlarged left axillary lymph nodes along with asymmetric ducts between mass and the nipple possibly DCIS; lymph node biopsy: Positive, breast biopsy: Grade 2 IDC ER 100%, PR 0%, HER2 negative, Ki-67 10%   03/10/2022 Cancer Staging   Staging form: Breast, AJCC 8th Edition - Clinical stage from 03/10/2022: Stage IIA (cT1b, cN1(f), cM0, G2, ER+, PR-, HER2-) - Signed by Ronny Bacon, PA-C on 03/10/2022 Stage prefix: Initial diagnosis Method of lymph node assessment: Core biopsy Histologic grading system: 3 grade system   03/27/2022 - 08/08/2022 Chemotherapy   Patient is on Treatment Plan : BREAST ADJUVANT DOSE DENSE AC q14d / PACLitaxel q7d        INTERVAL HISTORY:  Veronica Robles to review her survivorship care plan detailing her treatment course for breast cancer, as well as monitoring long-term side effects of that treatment, education regarding health maintenance, screening, and overall wellness and health promotion.     Overall, Veronica Robles reports feeling quite well.  She continues on Anastrozole daily.  She is tolerating it well and has no issues with taking it.    REVIEW OF SYSTEMS:  Review of Systems  Constitutional:  Negative for appetite change, chills, fatigue, fever and unexpected weight change.  HENT:   Negative for hearing loss, lump/mass and trouble swallowing.   Eyes:  Negative for eye problems and icterus.  Respiratory:  Negative for chest tightness, cough and shortness of breath.   Cardiovascular:  Negative for chest pain, leg swelling and palpitations.  Gastrointestinal:  Negative for abdominal distention, abdominal pain, constipation, diarrhea, nausea and vomiting.  Endocrine: Negative for hot  flashes.  Genitourinary:  Negative for difficulty urinating.   Musculoskeletal:  Negative for arthralgias.  Skin:  Negative for itching and rash.  Neurological:  Negative for dizziness, extremity weakness, headaches and numbness.  Hematological:  Negative for adenopathy. Does not bruise/bleed easily.  Psychiatric/Behavioral:  Negative for depression. The patient is not nervous/anxious.   Breast: Denies any new nodularity, masses, tenderness, nipple changes, or nipple discharge.       PAST MEDICAL/SURGICAL HISTORY:  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   Family history of breast cancer 03/20/2022   Family history of pancreatic cancer 03/20/2022   HIV infection (HCC) 1993   Hypertension    Past Surgical History:  Procedure Laterality Date   ABDOMINAL HYSTERECTOMY     APPENDECTOMY     BREAST BIOPSY Right 2015   BREAST BIOPSY Left 08/26/2022   [place] LT RADIOACTIVE SEED LOC 08/26/2022 [facility]   BREAST BIOPSY  08/26/2022   MM LT RADIOACTIVE SEED LOC MAMMO GUIDE 08/26/2022 [facility]MO[facility]   BREAST LUMPECTOMY WITH RADIOACTIVE SEED LOCALIZATION Left 08/27/2022   Procedure: LEFT BREAST LUMPECTOMY WITH RADIOACTIVE SEED LOCALIZATION;  Surgeon: Abigail Miyamoto, MD;  Location: [facility];  Service: General;  Laterality: Left;   CYSTOSCOPY/RETROGRADE/URETEROSCOPY Right 11/14/2016   Procedure: CYSTOSCOPY/RETROGRADE/URETEROSCOPY/ STONE EXTRACTION/HOLMIUM LASER/STENT PLACEMENT;  Surgeon: Ihor Gully, MD;  Location: [facility];  Service: Urology;  Laterality: Right;   PORT-A-CATH REMOVAL Right 08/27/2022   Procedure: REMOVAL PORT-A-CATH;  Surgeon: Abigail Miyamoto, MD;  Location: [facility];  Service: General;  Laterality: Right;   PORTACATH PLACEMENT Right  03/26/2022   Procedure: INSERTION PORT-A-CATH;  Surgeon: Abigail Miyamoto, MD;  Location: [facility];  Service: General;  Laterality:  Right;   RADIOACTIVE SEED GUIDED AXILLARY SENTINEL LYMPH NODE Left 08/27/2022   Procedure: RADIOACTIVE SEED GUIDED LEFT AXILLARY SENTINEL LYMPH NODE DISSECTION;  Surgeon: Abigail Miyamoto, MD;  Location: [facility];  Service: General;  Laterality: Left;     ALLERGIES:  Allergies  Allergen Reactions   Penicillins Rash    Has patient had a PCN reaction causing immediate rash, facial/tongue/throat swelling, SOB or lightheadedness with hypotension: No Has patient had a PCN reaction causing severe rash involving mucus membranes or skin necrosis: No Has patient had a PCN reaction that required hospitalization No Has patient had a PCN reaction occurring within the last 10 years: No If all of the above answers are "NO", then may proceed with Cephalosporin use.      CURRENT MEDICATIONS:  Outpatient Encounter Medications as of 02/23/2023  Medication Sig   anastrozole (ARIMIDEX) 1 MG tablet Take 1 tablet (1 mg total) by mouth daily.   Blood Pressure Monitoring (ADULT BLOOD PRESSURE CUFF LG) KIT     emtricitabine-tenofovir AF (DESCOVY) 200-25 MG tablet Take 1 tablet by mouth daily.   fostemsavir tromethamine (RUKOBIA) 600 MG TB12 ER tablet Take 1 tablet (600 mg total) by mouth 2 (two) times daily.   hyaluronate sodium (RADIAPLEXRX) GEL Apply 1 Application topically once for 1 dose.   hydrochlorothiazide (HYDRODIURIL) 25 MG tablet Take 1 tablet (25 mg total) by mouth daily.   SQ injection lenacapavir (SUNLENCA) 463.5 MG/1.5ML SQ injection Inject 3 mLs (927 mg total) into the skin every 6 (six) months. Administer each injection subcutaneously at separate sites in the abdomen (more or equal to 2 inches from the navel).   No facility-administered encounter medications on file as of 02/23/2023.     ONCOLOGIC FAMILY HISTORY:  Family History  Problem Relation Age of Onset   Hypertension Mother    Pancreatic cancer Father 78   Breast cancer Sister        dx 27s   Lung cancer Sister         d. 68     SOCIAL HISTORY:  Social History   Socioeconomic History   Marital status: Single    Spouse name: Not on file   Number of children: Not on file   Years of education: Not on file   Highest education level: Not on file  Occupational History   Not on file  Tobacco Use   Smoking status: Former    Current packs/day: 0.10    Types: Cigarettes   Smokeless tobacco: Never  Vaping Use   Vaping status: Never Used  Substance and Sexual Activity   Alcohol use: No    Alcohol/week: 0.0 standard drinks of alcohol   Drug use: No   Sexual activity: Not Currently    Partners: Male    Birth control/protection: Surgical    Comment: declined condoms  Other Topics Concern   Not on file  Social History Narrative   Not on file   Social Determinants of Health   Financial Resource Strain: Low Risk  (08/14/2022)   Overall Financial Resource Strain (CARDIA)    Difficulty of Paying Living Expenses: Not hard at all  Food Insecurity: No Food Insecurity (08/14/2022)   Hunger Vital Sign    Worried About Running Out of Food in the Last Year: Never true    Ran Out of Food in the Last Year:  Never true  Transportation Needs: No Transportation Needs (08/14/2022)   PRAPARE - Administrator, Civil Service (Medical): No    Lack of Transportation (Non-Medical): No  Physical Activity: Inactive (08/14/2022)   Exercise Vital Sign    Days of Exercise per Week: 0 days    Minutes of Exercise per Session: 0 min  Stress: No Stress Concern Present (08/14/2022)   [employer] of Occupational Health - Occupational Stress Questionnaire    Feeling of Stress : Not at all  Social Connections: Unknown (11/04/2022)   Social Connection and Isolation Panel [NHANES]    Frequency of Communication with Friends and Family: More than three times a week    Frequency of Social Gatherings with Friends and Family: More than three times a week    Attends Religious Services: More than 4 times per year     Active Member of [employer] or Organizations: No    Attends Engineer, structural: 1 to 4 times per year    Marital Status: Patient unable to answer  Intimate Partner Violence: Not on file     OBSERVATIONS/OBJECTIVE:  BP (!) 142/84 (BP Location: Left Arm, Patient Position: Sitting)   Pulse 68   Temp 98.1 F (36.7 C) (Temporal)   Resp 16   Wt 127 lb 3.2 oz (57.7 kg)   SpO2 98%   BMI 23.27 kg/m  GENERAL: Patient is a well appearing female in no acute distress HEENT:  Sclerae anicteric.  Oropharynx clear and moist. No ulcerations or evidence of oropharyngeal candidiasis. Neck is supple.  NODES:  No cervical, supraclavicular, or axillary lymphadenopathy palpated.  BREAST EXAM:  left breast s/p lumpectomy and radiation, no sign of recurrence, right breast is benign LUNGS:  Clear to auscultation bilaterally.  No wheezes or rhonchi. HEART:  Regular rate and rhythm. No murmur appreciated. ABDOMEN:  Soft, nontender.  Positive, normoactive bowel sounds. No organomegaly palpated. MSK:  No focal spinal tenderness to palpation. Full range of motion bilaterally in the upper extremities. EXTREMITIES:  No peripheral edema.   SKIN:  Clear with no obvious rashes or skin changes. No nail dyscrasia. NEURO:  Nonfocal. Well oriented.  Appropriate affect.   LABORATORY DATA:  None for this visit.  DIAGNOSTIC IMAGING:  None for this visit.      ASSESSMENT AND PLAN:  Ms.. Robles is a pleasant 67 y.o. female with Stage IIA left breast invasive ductal carcinoma, ER+/PR-/HER2-, diagnosed in 02/2022, treated with neoadjuvant chemotherapy, lumpectomy, adjuvant radiation therapy, and anti-estrogen therapy with Anastrozole beginning in 11/2022.  She presents to the Survivorship Clinic for our initial meeting and routine follow-up post-completion of treatment for breast cancer.    1. Stage IIA left breast cancer:  Veronica Robles is continuing to recover from definitive treatment for breast cancer. She will  follow-up with her medical oncologist, Dr.  Pamelia Hoit in 6 months with history and physical exam per surveillance protocol.  Reached out to him to understand if he wanted to see her sooner to consider adjuvant Verzenio with her anastrozole.  She will continue her anti-estrogen therapy with anastrozole. Thus far, she is tolerating the anastrozole well, with minimal side effects. Her mammogram is due 04/2023; orders placed today.   Today, a comprehensive survivorship care plan and treatment summary was reviewed with the patient today detailing her breast cancer diagnosis, treatment course, potential late/long-term effects of treatment, appropriate follow-up care with recommendations for the future, and patient education resources.  A copy of this summary, along with a letter  will be sent to the patient's primary care provider via mail/fax/In Basket message after today's visit.    2. Bone health:  Given Veronica Robles's age/history of breast cancer and her current treatment regimen including anti-estrogen therapy with anastrozole, she is at risk for bone demineralization.  Her most recent bone density testing occurred in May 2024 and demonstrated osteopenia with a T-score -2.2 in the left femur.  She is recommended to repeat bone density testing in May 2026.  She was given education on specific activities to promote bone health.  3. Cancer screening:  Due to Veronica Robles's history and her age, she should receive screening for skin cancers, colon cancer, and gynecologic cancers.  The information and recommendations are listed on the patient's comprehensive care plan/treatment summary and were reviewed in detail with the patient.    4. Health maintenance and wellness promotion: Veronica Robles was encouraged to consume 5-7 servings of fruits and vegetables per day. We reviewed the "Nutrition Rainbow" handout.  She was also encouraged to engage in moderate to vigorous exercise for 30 minutes per day most days of the week.  She  was instructed to limit her alcohol consumption and continue to abstain from tobacco use.     5. Support services/counseling: It is not uncommon for this period of the patient's cancer care trajectory to be one of many emotions and stressors.   She was given information regarding our available services and encouraged to contact me with any questions or for help enrolling in any of our support group/programs.    Follow up instructions:    -Return to cancer center in 6 months for follow-up with Dr. Pamelia Hoit or sooner based on his review of whether he wants her to start Verzenio -Mammogram due in 04/2023 -Bone density due 11/2024 -She is welcome to return back to the Survivorship Clinic at any time; no additional follow-up needed at this time.  -Consider referral back to survivorship as a long-term survivor for continued surveillance  The patient was provided an opportunity to ask questions and all were answered. The patient agreed with the plan and demonstrated an understanding of the instructions.   Total encounter time:45 minutes*in face-to-face visit time, chart review, lab review, care coordination, order entry, and documentation of the encounter time.    Lillard Anes, NP 02/23/23 3:51 PM Medical Oncology and Hematology [facility] [address] [place], [place] [address] Tel. [phone]    Fax. [phone]  *Total Encounter Time as defined by the Centers for Medicare and Medicaid Services includes, in addition to the face-to-face time of a patient visit (documented in the note above) non-face-to-face time: obtaining and reviewing outside history, ordering and reviewing medications, tests or procedures, care coordination (communications with other health care professionals or caregivers) and documentation in the medical record.

## 2023-02-24 ENCOUNTER — Other Ambulatory Visit: Payer: Self-pay

## 2023-02-25 ENCOUNTER — Encounter: Payer: Self-pay | Admitting: Adult Health

## 2023-02-25 ENCOUNTER — Telehealth: Payer: Self-pay | Admitting: Adult Health

## 2023-02-25 ENCOUNTER — Encounter: Payer: Self-pay | Admitting: Hematology and Oncology

## 2023-02-25 NOTE — Telephone Encounter (Signed)
Per Lillard Anes, DNP, called pt with message below, pt verbalized understanding. Scheduling message sent.

## 2023-02-25 NOTE — Telephone Encounter (Signed)
-----   Message from Nurse Logen Fowle C sent at 02/25/2023  1:41 PM EDT ----- Veronica Robles, pt agreed to see Jonny Ruiz. Can you schedule her with him and call to update her. Thx ----- Message ----- From: Loa Socks, NP Sent: 02/25/2023  12:19 PM EDT To: [facility]  Please call patient, Veronica Robles wants her to RTC in 2 weeks for visit with Marylynn Pearson to discuss adding Verzenio to her anastrozole to keep her risk of breast cancer recurrence as low as possible.    Let her know I tlaked to Veronica Robles about it after our visit this past week.

## 2023-02-26 ENCOUNTER — Encounter: Payer: Self-pay | Admitting: Hematology and Oncology

## 2023-02-27 ENCOUNTER — Telehealth: Payer: Self-pay | Admitting: Adult Health

## 2023-02-27 NOTE — Telephone Encounter (Signed)
Scheduled appointment per staff message. Left voicemail. 

## 2023-03-02 ENCOUNTER — Other Ambulatory Visit: Payer: Self-pay

## 2023-03-02 ENCOUNTER — Other Ambulatory Visit: Payer: Self-pay | Admitting: Internal Medicine

## 2023-03-02 ENCOUNTER — Other Ambulatory Visit (HOSPITAL_COMMUNITY): Payer: Self-pay

## 2023-03-02 ENCOUNTER — Ambulatory Visit: Payer: Medicare Other | Attending: Surgery

## 2023-03-02 VITALS — Wt 126.5 lb

## 2023-03-02 DIAGNOSIS — Z483 Aftercare following surgery for neoplasm: Secondary | ICD-10-CM | POA: Insufficient documentation

## 2023-03-02 DIAGNOSIS — B20 Human immunodeficiency virus [HIV] disease: Secondary | ICD-10-CM

## 2023-03-02 MED ORDER — DESCOVY 200-25 MG PO TABS
1.0000 | ORAL_TABLET | Freq: Every day | ORAL | 5 refills | Status: DC
Start: 2023-03-02 — End: 2023-08-27
  Filled 2023-03-02: qty 30, 30d supply, fill #0
  Filled 2023-03-26: qty 30, 30d supply, fill #1
  Filled 2023-04-29: qty 30, 30d supply, fill #2
  Filled 2023-05-20: qty 30, 30d supply, fill #3
  Filled 2023-06-24: qty 30, 30d supply, fill #4
  Filled 2023-07-23: qty 30, 30d supply, fill #5

## 2023-03-02 MED ORDER — RUKOBIA 600 MG PO TB12
600.0000 mg | ORAL_TABLET | Freq: Two times a day (BID) | ORAL | 5 refills | Status: DC
Start: 2023-03-02 — End: 2023-08-27
  Filled 2023-03-02: qty 60, 30d supply, fill #0
  Filled 2023-03-26: qty 60, 30d supply, fill #1
  Filled 2023-04-29: qty 60, 30d supply, fill #2
  Filled 2023-05-20: qty 60, 30d supply, fill #3
  Filled 2023-06-24: qty 60, 30d supply, fill #4
  Filled 2023-07-23: qty 60, 30d supply, fill #5

## 2023-03-02 NOTE — Therapy (Signed)
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   Family history of breast cancer 03/20/2022   Family history of pancreatic cancer 03/20/2022   HIV infection (HCC) 1993   Hypertension    Past Surgical History:  Procedure Laterality Date   ABDOMINAL HYSTERECTOMY     APPENDECTOMY     BREAST BIOPSY Right 2015   BREAST BIOPSY Left 08/26/2022   [place] LT RADIOACTIVE SEED LOC 08/26/2022 [facility]MO[facility]AP[facility]   BREAST BIOPSY  08/26/2022   MM LT RADIOACTIVE SEED LOC MAMMO GUIDE 08/26/2022 [facility] MAMMOGRAPHY   BREAST LUMPECTOMY WITH RADIOACTIVE SEED LOCALIZATION Left 08/27/2022   Procedure: LEFT BREAST LUMPECTOMY WITH RADIOACTIVE SEED LOCALIZATION;  Surgeon: Abigail Miyamoto, MD;  Location: [facility];  Service: General;  Laterality: Left;   CYSTOSCOPY/RETROGRADE/URETEROSCOPY Right 11/14/2016   Procedure: CYSTOSCOPY/RETROGRADE/URETEROSCOPY/ STONE EXTRACTION/HOLMIUM LASER/STENT PLACEMENT;  Surgeon: Ihor Gully, MD;  Location: [facility];  Service: Urology;  Laterality: Right;   PORT-A-CATH REMOVAL Right 08/27/2022   Procedure: REMOVAL PORT-A-CATH;  Surgeon: Abigail Miyamoto, MD;  Location: [facility];  Service: General;  Laterality: Right;   PORTACATH PLACEMENT Right 03/26/2022   Procedure: INSERTION PORT-A-CATH;  Surgeon: Abigail Miyamoto, MD;  Location: [facility];  Service: General;  Laterality:  Right;   RADIOACTIVE SEED GUIDED AXILLARY SENTINEL LYMPH NODE Left 08/27/2022   Procedure: RADIOACTIVE SEED GUIDED LEFT AXILLARY SENTINEL LYMPH NODE DISSECTION;  Surgeon: Abigail Miyamoto, MD;  Location: [facility];  Service: General;  Laterality: Left;   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  3 of 4 lymph nodes positive. Neoadjuvant chemotherapy completed and will be having radiation. It is ER positive, PR negative, and HER2 negative with a Ki67 of 10%. She has a positive axillary node. She also has HIV disease which she has not shared with her family.   PRECAUTIONS: left UE Lymphedema risk, None  SUBJECTIVE: Pt returns for her 3 month L-Dex screen.   PAIN:  Are you having pain? No  SOZO SCREENING: Patient was assessed today using the SOZO machine to determine the lymphedema index score. This was compared to her baseline score. It was determined that she is within the recommended range when compared to her baseline and no further action is needed at this time. She will continue SOZO screenings. These are done every  3 months for 2 years post operatively followed by every 6 months for 2 years, and then annually.   L-DEX FLOWSHEETS - 03/02/23 1500       L-DEX LYMPHEDEMA SCREENING   Measurement Type Unilateral    L-DEX MEASUREMENT EXTREMITY Upper Extremity    POSITION  Standing    DOMINANT SIDE Right    At Risk Side Left    BASELINE SCORE (UNILATERAL) 0.2    L-DEX SCORE (UNILATERAL) 2.3    VALUE CHANGE (UNILAT) 2.1               Hermenia Bers, PTA 03/02/2023, 3:38 PM

## 2023-03-03 ENCOUNTER — Other Ambulatory Visit: Payer: Self-pay

## 2023-03-06 ENCOUNTER — Other Ambulatory Visit: Payer: Self-pay

## 2023-03-11 ENCOUNTER — Other Ambulatory Visit: Payer: Self-pay

## 2023-03-11 ENCOUNTER — Inpatient Hospital Stay: Payer: Medicare Other | Attending: Adult Health | Admitting: Pharmacist

## 2023-03-11 VITALS — BP 127/63 | HR 85 | Temp 97.2°F | Resp 18 | Ht 62.0 in | Wt 127.1 lb

## 2023-03-11 DIAGNOSIS — Z87891 Personal history of nicotine dependence: Secondary | ICD-10-CM | POA: Insufficient documentation

## 2023-03-11 DIAGNOSIS — Z79811 Long term (current) use of aromatase inhibitors: Secondary | ICD-10-CM | POA: Insufficient documentation

## 2023-03-11 DIAGNOSIS — Z79899 Other long term (current) drug therapy: Secondary | ICD-10-CM | POA: Insufficient documentation

## 2023-03-11 DIAGNOSIS — Z170 Estrogen receptor positive status [ER+]: Secondary | ICD-10-CM | POA: Diagnosis not present

## 2023-03-11 DIAGNOSIS — C50412 Malignant neoplasm of upper-outer quadrant of left female breast: Secondary | ICD-10-CM | POA: Insufficient documentation

## 2023-03-11 NOTE — Progress Notes (Signed)
[facility]       Telephone: [phone]?Fax: [phone]   Oncology Clinical Pharmacist Practitioner Initial Assessment  Veronica Robles is a 67 y.o. female with a diagnosis of breast cancer. They were contacted today via in person visit.  Indication/Regimen Abemaciclib (Verzenio) is being used appropriately for treatment of breast cancer by Dr. Serena Croissant.      Wt Readings from Last 1 Encounters:  03/11/23 127 lb 1.6 oz (57.7 kg)    Estimated body surface area is 1.59 meters squared as calculated from the following:   Height as of this encounter: 5\' 2"  (1.575 m).   Weight as of this encounter: 127 lb 1.6 oz (57.7 kg).  The dosing regimen is 150 mg by mouth every 12 hours on days 1 to 28 of a 28-day cycle. This would be given in combination with anastrozole. It is planned to continue until two years in the adjuvant setting per the monarchE trial data.  [name] was seen today by clinical pharmacy to discuss potentially starting abemaciclib in the adjuvant setting after being referred by Dr. Pamelia Hoit.  We discussed why she would potentially be on this medication per Dr. Earmon Phoenix recommendations.  We also discussed side effects of abemaciclib which include but are not limited to nausea, vomiting, diarrhea, interstitial lung disease/pneumonitis, blood clots, liver toxicity, renal toxicity, taste alterations, lack of appetite, and fatigue.  Ms. Lande asked great questions and all questions were answered to her satisfaction. She would like to take the information we discussed and the written information given to her today and think about it. She will contact Dr. Earmon Phoenix clinic if she is interested in starting. No follow up labs or visits entered today. She next sees Dr. Pamelia Hoit in January of next year unless she starts abemaciclib.    Allergies Allergies  Allergen Reactions   Penicillins Rash    Has patient had a PCN reaction causing immediate rash, facial/tongue/throat  swelling, SOB or lightheadedness with hypotension: No Has patient had a PCN reaction causing severe rash involving mucus membranes or skin necrosis: No Has patient had a PCN reaction that required hospitalization No Has patient had a PCN reaction occurring within the last 10 years: No If all of the above answers are "NO", then may proceed with Cephalosporin use.     Vitals    03/11/2023    2:59 PM 03/02/2023    3:36 PM 02/23/2023    3:31 PM  Oncology Vitals  Height 158 cm    Weight 57.652 kg 57.38 kg 57.698 kg  Weight (lbs) 127 lbs 2 oz 126 lbs 8 oz 127 lbs 3 oz  BMI 23.25 kg/m2 23.14 kg/m2 23.27 kg/m2  Temp 97.2 F (36.2 C)  98.1 F (36.7 C)  Pulse Rate 85  68  BP 127/63  142/84  Resp 18  16  SpO2 99 %  98 %  BSA (m2) 1.59 m2 1.58 m2 1.59 m2     Laboratory Data: no labs were done today as this was information session only on abemaciclib    Latest Ref Rng & Units 02/03/2023    1:40 AM 11/18/2022    2:11 AM 08/08/2022    8:45 AM  CBC EXTENDED  WBC 3.8 - 10.8 Thousand/uL 5.4  4.1  2.9   RBC 3.80 - 5.10 Million/uL 4.39  4.42  3.20   Hemoglobin 11.7 - 15.5 g/dL 52.8  41.3  24.4   HCT 35.0 - 45.0 % 37.7  37.9  30.2  Platelets 140 - 400 Thousand/uL 145  166  156   NEUT# 1,500 - 7,800 cells/uL 3,510  2,640  1.6   Lymph# 850 - 3,900 cells/uL 1,258  853  0.9        Latest Ref Rng & Units 02/03/2023    1:40 AM 11/18/2022    2:11 AM 08/08/2022    8:45 AM  CMP  Glucose 65 - 99 mg/dL 93  841  99   BUN 7 - 25 mg/dL 10  11  10    Creatinine 0.50 - 1.05 mg/dL 3.24  4.01  0.27   Sodium 135 - 146 mmol/L 140  140  140   Potassium 3.5 - 5.3 mmol/L 3.9  3.7  3.9   Chloride 98 - 110 mmol/L 101  101  108   CO2 20 - 32 mmol/L 29  31  29    Calcium 8.6 - 10.4 mg/dL 9.8  9.5  9.1   Total Protein 6.1 - 8.1 g/dL 6.7  6.4  5.9   Total Bilirubin 0.2 - 1.2 mg/dL 0.4  0.5  0.3   Alkaline Phos 38 - 126 U/L   58   AST 10 - 35 U/L 20  21  21    ALT 6 - 29 U/L 15  17  25      Contraindications Contraindications were reviewed? Yes Contraindications to therapy were identified? No   Safety Precautions The following safety precautions for the use of abemaciclib were reviewed:  Changes in kidney function: importance of drinking plenty of fluids and monitoring urine output Diarrhea: we reviewed that diarrhea is common with abemaciclib and confirmed that she does have loperamide (Imodium) at home.  We reviewed how to take this medication PRN and gave her information on abemaciclib Decreased white blood cells (WBCs) and increased risk for infection: we discussed the importance of having a thermometer and what the Centers for Disease Control and Prevention (CDC) considers a fever which is 100.68F (38C) or higher.  Gave patient 24/7 triage line to call if any fevers or symptoms Decreased hemoglobin, part of red blood cells that carry iron and oxygen Fatigue Nausea and Vomiting Hepatotoxicity: reviewed to contact clinic for RUQ pain that will not subside, yellowing of eyes/skin Decreased appetite or weight loss Abdominal pain Decreased platelet count and increased risk for bleeding Venous thromboembolism (VTE): reviewed signs of deep vein thrombosis (DVT) such as leg swelling, redness, pain, or tenderness and signs of pulmonary embolism (PE) such as shortness of breath, rapid or irregular heartbeat, cough, chest pain, or lightheadedness ILD/Pneumonitis: we reviewed potential symptoms including cough, shortness, and fatigue. Handling body fluids and waste Pregnancy, sexual activity, and contraception Reviewed to take the medication every 12 hours (with food sometimes can be easier on the stomach) and to take it at the same time every day. Discussed proper storage and handling of abemaciclib  Medication Reconciliation Current Outpatient Medications  Medication Sig Dispense Refill   anastrozole (ARIMIDEX) 1 MG tablet Take 1 tablet (1 mg total) by mouth daily. 90 tablet 3    Blood Pressure Monitoring (ADULT BLOOD PRESSURE CUFF LG) KIT       emtricitabine-tenofovir AF (DESCOVY) 200-25 MG tablet Take 1 tablet by mouth daily. 30 tablet 5   fostemsavir tromethamine (RUKOBIA) 600 MG TB12 ER tablet Take 1 tablet (600 mg total) by mouth 2 (two) times daily. 60 tablet 5   hydrochlorothiazide (HYDRODIURIL) 25 MG tablet Take 1 tablet (25 mg total) by mouth daily. 90 tablet 1   SQ injection  lenacapavir (SUNLENCA) 463.5 MG/1.5ML SQ injection Inject 3 mLs (927 mg total) into the skin every 6 (six) months. Administer each injection subcutaneously at separate sites in the abdomen (more or equal to 2 inches from the navel). 3 mL 2   No current facility-administered medications for this visit.   Medication reconciliation is based on the patient's most recent medication list in the electronic medical record (EMR) including herbal products and OTC medications.   The patient's medication list was reviewed today with the patient? Yes   Drug-drug interactions (DDIs) DDIs were evaluated? Yes Significant DDIs identified? No   Drug-Food Interactions Drug-food interactions were evaluated? Yes Drug-food interactions identified?  Avoid grapefruit products  Follow-up Plan  She will continue to follow with Dr. Pamelia Hoit as scheduled. She will review the material that was given to her today on abemaciclib and what we discussed.  She will let Dr. Earmon Phoenix clinic know if she is interested in starting abemaciclib.  At that point we would schedule labs and visits per manufacturing guideline recommendations. At this time, she can follow-up with clinical pharmacy on an as-needed basis  RHAE AUSLANDER participated in the discussion, expressed understanding, and voiced agreement with the above plan. All questions were answered to her satisfaction. The patient was advised to contact the clinic at [phone] with any questions or concerns prior to her return visit.   I spent 60 minutes  assessing the patient.   A. Odetta Pink, PharmD, BCOP, CPP  Anselm Lis, RPH-CPP, 03/11/2023 3:34 PM  **Disclaimer: This note was dictated with voice recognition software. Similar sounding words can inadvertently be transcribed and this note may contain transcription errors which may not have been corrected upon publication of note.**

## 2023-03-17 ENCOUNTER — Other Ambulatory Visit (HOSPITAL_COMMUNITY): Payer: Self-pay

## 2023-03-19 ENCOUNTER — Other Ambulatory Visit (HOSPITAL_COMMUNITY): Payer: Self-pay

## 2023-03-26 ENCOUNTER — Other Ambulatory Visit (HOSPITAL_COMMUNITY): Payer: Self-pay

## 2023-03-31 ENCOUNTER — Encounter: Payer: Self-pay | Admitting: Hematology and Oncology

## 2023-04-07 ENCOUNTER — Ambulatory Visit: Payer: Medicare Other

## 2023-04-13 ENCOUNTER — Other Ambulatory Visit (HOSPITAL_COMMUNITY): Payer: Self-pay

## 2023-04-14 ENCOUNTER — Other Ambulatory Visit: Payer: Self-pay | Admitting: Pharmacist

## 2023-04-14 ENCOUNTER — Other Ambulatory Visit (HOSPITAL_COMMUNITY): Payer: Self-pay

## 2023-04-14 ENCOUNTER — Other Ambulatory Visit: Payer: Self-pay

## 2023-04-14 DIAGNOSIS — B20 Human immunodeficiency virus [HIV] disease: Secondary | ICD-10-CM

## 2023-04-14 MED ORDER — SUNLENCA 463.5 MG/1.5ML ~~LOC~~ SOLN
927.0000 mg | SUBCUTANEOUS | 2 refills | Status: AC
Start: 2023-04-14 — End: ?
  Filled 2023-04-14: qty 3, 180d supply, fill #0
  Filled 2023-10-16: qty 3, 180d supply, fill #1
  Filled 2024-03-24: qty 3, 180d supply, fill #2

## 2023-04-15 ENCOUNTER — Telehealth: Payer: Self-pay

## 2023-04-15 ENCOUNTER — Ambulatory Visit: Payer: Medicare Other | Admitting: Pharmacist

## 2023-04-15 NOTE — Telephone Encounter (Signed)
[facility] Patient Advocate Encounter  Patient's medication [name]) have been couriered to [facility] from [facility] and will be administered on the patient next office visit on 04/20/23.  Clearance Coots , CPhT Specialty Pharmacy Patient [facility] for Infectious Disease Phone: [phone] Fax:  [phone]

## 2023-04-17 ENCOUNTER — Encounter: Payer: Self-pay | Admitting: Hematology and Oncology

## 2023-04-20 ENCOUNTER — Ambulatory Visit (INDEPENDENT_AMBULATORY_CARE_PROVIDER_SITE_OTHER): Payer: Medicare Other | Admitting: Pharmacist

## 2023-04-20 ENCOUNTER — Other Ambulatory Visit: Payer: Self-pay

## 2023-04-20 DIAGNOSIS — B20 Human immunodeficiency virus [HIV] disease: Secondary | ICD-10-CM | POA: Diagnosis not present

## 2023-04-20 MED ORDER — LENACAPAVIR SODIUM 463.5 MG/1.5ML ~~LOC~~ SOLN
927.0000 mg | Freq: Once | SUBCUTANEOUS | Status: AC
Start: 2023-04-20 — End: 2023-04-20
  Administered 2023-04-20: 927 mg via SUBCUTANEOUS

## 2023-04-20 NOTE — Progress Notes (Signed)
HPI: Veronica Robles is a 67 y.o. female who presents to the [facility] pharmacy clinic for [facility] administration.  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   BLOOD PRESSURE MONITORING (ADULT BLOOD PRESSURE CUFF LG) KIT        EMTRICITABINE-TENOFOVIR AF (DESCOVY) 200-25 MG TABLET    Take 1 tablet by mouth daily.   FOSTEMSAVIR TROMETHAMINE (RUKOBIA) 600 MG TB12 ER TABLET    Take 1 tablet (600 mg total) by mouth 2 (two) times daily.   HYDROCHLOROTHIAZIDE (HYDRODIURIL) 25 MG TABLET    Take 1 tablet (25 mg total) by mouth daily.   SQ INJECTION LENACAPAVIR (SUNLENCA) 463.5 MG/1.5ML SQ INJECTION    Inject 3 mLs (927 mg total) into the skin every 6 (six) months. Administer each injection subcutaneously at separate sites in the abdomen (more or equal to 2 inches from the navel).  Modified Medications   No medications on file  Discontinued Medications   No medications on file    Labs: Lab Results  Component Value Date   HIV1RNAQUANT Not Detected 02/03/2023   HIV1RNAQUANT <20 (H) 11/18/2022   HIV1RNAQUANT <20 (H) 08/12/2022    CD4TABS 218 (L) 02/03/2023   CD4TABS 174 (L) 11/18/2022   CD4TABS 128 (L) 08/12/2022    RPR and STI Lab Results  Component Value Date   LABRPR NON-REACTIVE 02/03/2023   LABRPR NON-REACTIVE 05/01/2020   LABRPR NON-REACTIVE 05/10/2018   LABRPR NON REAC 10/30/2015   LABRPR NON REAC 05/24/2015    STI Results GC CT  02/03/2023  1:54 PM Negative  Negative   05/01/2020  2:25 PM Negative  Negative   03/04/2016 12:00 AM Negative  Negative   12/05/2013 12:00 AM  CT: Negative     Hepatitis B Lab Results  Component Value Date   HEPBSAB INDETER (A) 08/08/2009   HEPBSAG NEG 08/08/2009   HEPBCAB POS (A) 08/08/2009   Hepatitis C No results found for: "HEPCAB", "HCVRNAPCRQN" Hepatitis A Lab Results  Component Value Date   HAV POS (A) 08/08/2009   Lipids: Lab Results  Component Value Date   CHOL 209 (H) 02/03/2023   TRIG 220 (H) 02/03/2023   HDL 48 (L) 02/03/2023   CHOLHDL 4.4 02/03/2023   VLDL 37 (H) 10/30/2015   LDLCALC 124 (H) 02/03/2023    Current HIV Regimen: Sunlenca + Descovy + Rukobia  TARGET DATE: The 19th  Assessment: Sirenia presents today for Sunlenca injection follow up. She is doing quite well with her HIV regimen of Descovy, Rukobia, and [place]. She has been undetectable for several months. She is doing better with swallowing the [place] and is very thankful to finally have  a regimen to keep her HIV under control. Congratulated her on this.   Administered lenacapavir 463.5/1.68mL subcutaneously in upper right abdomen. Administered lenacapavir 463.5/1.28mL subcutaneously in lower right abdomen more than two inches away from first injection site. Monitored patient for 10 minutes after injection. Injections were tolerated well without issue. Will follow up with Dr. Drue Second in January. Will need a follow up appointment for injections in 6 months (~March 19th 2025).   She requests to get her annual flu and COVID vaccines in October. Will make an appointment for her for  next week.    Plan: - Continue Descovy and Rukobia - Sunlenca injections administered - Schedule Sunlenca injections in 6 months (will do this next week at vaccine appointment) - Follow up with me on 04/29/23 for vaccine appointment - Follow up with Dr. Drue Second on 08/17/22 - Contact for any issues or questions.   Ravan Schlemmer L. Hendrick Pavich, PharmD, BCIDP, AAHIVP, CPP Clinical Pharmacist Practitioner Infectious Diseases Clinical Pharmacist [facility] 04/20/2023, 2:02 PM

## 2023-04-27 NOTE — Progress Notes (Unsigned)
   [facility] Vaccination Visit  HPI: Veronica Robles is a 67 y.o. female who presents to the [facility] pharmacy clinic for vaccine administration.  Hepatitis B Lab Results  Component Value Date   HEPBSAB INDETER (A) 08/08/2009   Lab Results  Component Value Date   HEPBSAG NEG 08/08/2009    Hepatitis C Lab Results  Component Value Date   HCVAB NEGATIVE 05/02/2013    Hepatitis A Lab Results  Component Value Date   HAV POS (A) 08/08/2009    Assessment & Plan: - Administered annual influenza and 2024-2025 COVID vaccines today - She tolerated well  Keyaira Clapham L. Jannette Fogo, PharmD, BCIDP, AAHIVP, CPP Clinical Pharmacist Practitioner Infectious Diseases Clinical Pharmacist [facility] 04/27/2023, 11:05 AM

## 2023-04-28 ENCOUNTER — Encounter: Payer: Self-pay | Admitting: Hematology and Oncology

## 2023-04-29 ENCOUNTER — Other Ambulatory Visit: Payer: Self-pay

## 2023-04-29 ENCOUNTER — Other Ambulatory Visit (HOSPITAL_COMMUNITY): Payer: Self-pay

## 2023-04-29 ENCOUNTER — Ambulatory Visit (INDEPENDENT_AMBULATORY_CARE_PROVIDER_SITE_OTHER): Payer: Medicare Other | Admitting: Pharmacist

## 2023-04-29 ENCOUNTER — Other Ambulatory Visit: Payer: Self-pay | Admitting: Pharmacist

## 2023-04-29 DIAGNOSIS — Z23 Encounter for immunization: Secondary | ICD-10-CM | POA: Diagnosis not present

## 2023-04-29 NOTE — Progress Notes (Signed)
Specialty Pharmacy Ongoing Clinical Assessment Note  Veronica Robles is a 67 y.o. female who is being followed by the specialty pharmacy service for RxSp HIV   Patient's specialty medication(s) reviewed today: Emtricitabine-Tenofovir Af; Fostemsavir Tromethamine   Missed doses in the last 4 weeks: 0   Patient did not have any additional questions or concerns.   Therapeutic benefit summary: Patient is achieving benefit   Adverse events/side effects summary: No adverse events/side effects   Patient's therapy is appropriate to: Continue    Goals Addressed             This Visit's Progress    Achieve Undetectable HIV Viral Load < 20       Patient is on track. Patient will maintain adherence.      Improve or maintain quality of life       Patient is on track. Patient will be monitored by provider to determine if a change in treatment plan is warranted.      Minimize and address adverse drug events/drug interactions       Patient is on track. Patient will be monitored by provider to determine if a change in treatment plan is warranted         Follow up:  6 months  Veronica Robles L. Rosmarie Esquibel, PharmD, BCIDP, AAHIVP, CPP Clinical Pharmacist Practitioner Infectious Diseases Clinical Pharmacist [facility] 04/29/2023, 2:36 PM

## 2023-04-29 NOTE — Progress Notes (Signed)
Specialty Pharmacy Refill Coordination Note  Veronica Robles is a 67 y.o. female contacted today regarding refills of specialty medication(s) Emtricitabine-Tenofovir Af; Fostemsavir Tromethamine   Patient requested Courier to Provider Office   Delivery date: 05/06/23   Verified address: [facility] [address] [place] [address]   Medication will be filled on 10/08.

## 2023-05-04 ENCOUNTER — Other Ambulatory Visit: Payer: Self-pay | Admitting: Family Medicine

## 2023-05-04 DIAGNOSIS — I10 Essential (primary) hypertension: Secondary | ICD-10-CM

## 2023-05-06 ENCOUNTER — Ambulatory Visit (INDEPENDENT_AMBULATORY_CARE_PROVIDER_SITE_OTHER): Payer: Medicare Other | Admitting: Family Medicine

## 2023-05-06 ENCOUNTER — Encounter: Payer: Self-pay | Admitting: Family Medicine

## 2023-05-06 ENCOUNTER — Telehealth: Payer: Self-pay

## 2023-05-06 VITALS — BP 126/77 | HR 84 | Temp 98.8°F | Resp 16 | Ht 62.0 in | Wt 128.4 lb

## 2023-05-06 DIAGNOSIS — B20 Human immunodeficiency virus [HIV] disease: Secondary | ICD-10-CM | POA: Diagnosis not present

## 2023-05-06 DIAGNOSIS — C50412 Malignant neoplasm of upper-outer quadrant of left female breast: Secondary | ICD-10-CM

## 2023-05-06 DIAGNOSIS — I10 Essential (primary) hypertension: Secondary | ICD-10-CM

## 2023-05-06 DIAGNOSIS — Z170 Estrogen receptor positive status [ER+]: Secondary | ICD-10-CM

## 2023-05-06 DIAGNOSIS — Z1211 Encounter for screening for malignant neoplasm of colon: Secondary | ICD-10-CM

## 2023-05-06 MED ORDER — POLYETHYLENE GLYCOL 3350 17 GM/SCOOP PO POWD: 17.0000 g | Freq: Every day | ORAL | 1 refills | Status: AC

## 2023-05-06 NOTE — Telephone Encounter (Signed)
error 

## 2023-05-06 NOTE — Telephone Encounter (Addendum)
[facility] Patient Advocate Encounter  Patient's medications (DESCOVY & RUKOBIA) have been couriered to [facility] from [facility] and will be picked up 05/06/23.    Medication was picked up at [facility] on  05/06/23  1:37pm     Kae Heller , CPhT Specialty Pharmacy Patient [facility] for Infectious Disease Phone: [phone] Fax:  [phone]

## 2023-05-08 ENCOUNTER — Encounter: Payer: Self-pay | Admitting: Family Medicine

## 2023-05-08 NOTE — Progress Notes (Addendum)
Established Patient Office Visit  Subjective    Patient ID: Veronica Robles, female    DOB: Oct 10, 1955  Age: 67 y.o. MRN: [id number]  CC:  Chief Complaint  Patient presents with   Follow-up    6 month    HPI Veronica Robles presents for follow up of chronic med issues.  Outpatient Encounter Medications as of 05/06/2023  Medication Sig   anastrozole (ARIMIDEX) 1 MG tablet Take 1 tablet (1 mg total) by mouth daily.   Blood Pressure Monitoring (ADULT BLOOD PRESSURE CUFF LG) KIT     emtricitabine-tenofovir AF (DESCOVY) 200-25 MG tablet Take 1 tablet by mouth daily.   fostemsavir tromethamine (RUKOBIA) 600 MG TB12 ER tablet Take 1 tablet (600 mg total) by mouth 2 (two) times daily.   hydrochlorothiazide (HYDRODIURIL) 25 MG tablet TAKE 1 TABLET(25 MG) BY MOUTH DAILY   polyethylene glycol powder (MIRALAX) 17 GM/SCOOP powder Take 17 g by mouth daily.   SQ injection lenacapavir (SUNLENCA) 463.5 MG/1.5ML SQ injection Inject 3 mLs (927 mg total) into the skin every 6 (six) months. Administer each injection subcutaneously at separate sites in the abdomen (more or equal to 2 inches from the navel).   No facility-administered encounter medications on file as of 05/06/2023.    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   Family history of breast cancer 03/20/2022   Family history of pancreatic cancer 03/20/2022   HIV infection (HCC) 1993   Hypertension     Past Surgical History:  Procedure Laterality Date   ABDOMINAL HYSTERECTOMY     APPENDECTOMY     BREAST BIOPSY Right 2015   BREAST BIOPSY Left 08/26/2022   [place] LT RADIOACTIVE SEED LOC 08/26/2022 [facility]   BREAST BIOPSY  08/26/2022   MM LT RADIOACTIVE SEED LOC MAMMO GUIDE 08/26/2022 [facility]   BREAST LUMPECTOMY WITH RADIOACTIVE SEED LOCALIZATION Left 08/27/2022   Procedure: LEFT BREAST LUMPECTOMY WITH RADIOACTIVE SEED LOCALIZATION;  Surgeon: Abigail Miyamoto, MD;   Location: [facility];  Service: General;  Laterality: Left;   CYSTOSCOPY/RETROGRADE/URETEROSCOPY Right 11/14/2016   Procedure: CYSTOSCOPY/RETROGRADE/URETEROSCOPY/ STONE EXTRACTION/HOLMIUM LASER/STENT PLACEMENT;  Surgeon: Ihor Gully, MD;  Location: [facility];  Service: Urology;  Laterality: Right;   PORT-A-CATH REMOVAL Right 08/27/2022   Procedure: REMOVAL PORT-A-CATH;  Surgeon: Abigail Miyamoto, MD;  Location: [facility];  Service: General;  Laterality: Right;   PORTACATH PLACEMENT Right 03/26/2022   Procedure: INSERTION PORT-A-CATH;  Surgeon: Abigail Miyamoto, MD;  Location: [facility];  Service: General;  Laterality: Right;   RADIOACTIVE SEED GUIDED AXILLARY SENTINEL LYMPH NODE Left 08/27/2022   Procedure: RADIOACTIVE SEED GUIDED LEFT AXILLARY SENTINEL LYMPH NODE DISSECTION;  Surgeon: Abigail Miyamoto, MD;  Location: [facility];  Service: General;  Laterality: Left;    Family History  Problem Relation Age of Onset   Hypertension Mother    Pancreatic cancer Father 3   Breast cancer Sister        dx 58s   Lung cancer Sister        d. 86    Social History   Socioeconomic History   Marital status: Single    Spouse name: Not on file   Number of children: Not on file   Years of education: Not on file   Highest education level: Not on file  Occupational History   Not on file  Tobacco Use   Smoking status: Former  Current packs/day: 0.10    Types: Cigarettes   Smokeless tobacco: Never  Vaping Use   Vaping status: Never Used  Substance and Sexual Activity   Alcohol use: No    Alcohol/week: 0.0 standard drinks of alcohol   Drug use: No   Sexual activity: Not Currently    Partners: Male    Birth control/protection: Surgical    Comment: declined condoms  Other Topics Concern   Not on file  Social History Narrative   Not on file   Social Determinants of Health   Financial Resource Strain: Low Risk  (08/14/2022)    Overall Financial Resource Strain (CARDIA)    Difficulty of Paying Living Expenses: Not hard at all  Food Insecurity: No Food Insecurity (08/14/2022)   Hunger Vital Sign    Worried About Running Out of Food in the Last Year: Never true    Ran Out of Food in the Last Year: Never true  Transportation Needs: No Transportation Needs (08/14/2022)   PRAPARE - Administrator, Civil Service (Medical): No    Lack of Transportation (Non-Medical): No  Physical Activity: Inactive (08/14/2022)   Exercise Vital Sign    Days of Exercise per Week: 0 days    Minutes of Exercise per Session: 0 min  Stress: No Stress Concern Present (05/06/2023)   [employer] of Occupational Health - Occupational Stress Questionnaire    Feeling of Stress : Only a little  Social Connections: Unknown (11/04/2022)   Social Connection and Isolation Panel [NHANES]    Frequency of Communication with Friends and Family: More than three times a week    Frequency of Social Gatherings with Friends and Family: More than three times a week    Attends Religious Services: More than 4 times per year    Active Member of [employer] or Organizations: No    Attends Engineer, structural: 1 to 4 times per year    Marital Status: Patient unable to answer  Intimate Partner Violence: Not on file    Review of Systems  All other systems reviewed and are negative.       Objective    BP 126/77 (BP Location: Right Arm, Patient Position: Sitting, Cuff Size: Normal)   Pulse 84   Temp 98.8 F (37.1 C) (Oral)   Resp 16   Ht 5\' 2"  (1.575 m)   Wt 128 lb 6.4 oz (58.2 kg)   SpO2 98%   BMI 23.48 kg/m   Physical Exam Vitals and nursing note reviewed.  Constitutional:      General: She is not in acute distress. Cardiovascular:     Rate and Rhythm: Normal rate and regular rhythm.  Pulmonary:     Effort: Pulmonary effort is normal.     Breath sounds: Normal breath sounds.  Musculoskeletal:     Right lower leg: No edema.      Left lower leg: No edema.  Neurological:     General: No focal deficit present.     Mental Status: She is alert and oriented to person, place, and time.         Assessment & Plan:  1. Essential hypertension Appears stable. Continue   2. HIV disease (HCC) Management as per consultant  3. Screening for colon cancer  - Cologuard  4. Malignant neoplasm of upper-outer quadrant of left breast in female, estrogen receptor positive (HCC) Management as per consultant.      No follow-ups on file.   Tommie Raymond, MD

## 2023-05-20 ENCOUNTER — Other Ambulatory Visit: Payer: Self-pay

## 2023-05-20 NOTE — Progress Notes (Signed)
Specialty Pharmacy Refill Coordination Note  Veronica Robles is a 67 y.o. female contacted today regarding refills of specialty medication(s) Emtricitabine-Tenofovir Af; Fostemsavir Tromethamine   Patient requested [name] at [facility] at [place] date: 06/01/23   Medication will be filled on 06/01/23.

## 2023-05-21 LAB — COLOGUARD: COLOGUARD: NEGATIVE

## 2023-05-31 ENCOUNTER — Encounter: Payer: Self-pay | Admitting: Hematology and Oncology

## 2023-06-02 ENCOUNTER — Other Ambulatory Visit (HOSPITAL_COMMUNITY): Payer: Self-pay

## 2023-06-08 ENCOUNTER — Ambulatory Visit: Payer: Medicare Other | Attending: Surgery

## 2023-06-08 VITALS — Wt 129.5 lb

## 2023-06-08 DIAGNOSIS — Z483 Aftercare following surgery for neoplasm: Secondary | ICD-10-CM | POA: Insufficient documentation

## 2023-06-08 NOTE — Therapy (Signed)
OUTPATIENT PHYSICAL THERAPY SOZO SCREENING NOTE   Patient Name: Veronica Robles MRN: [id number] DOB:February 14, 1956, 67 y.o., female Today's Date: 06/08/2023  PCP: Georganna Skeans, MD REFERRING PROVIDER: Abigail Miyamoto, MD   PT End of Session - 06/08/23 1537     Visit Number 7   # unchanged due to screen only   PT Start Time 1535    PT Stop Time 1539    PT Time Calculation (min) 4 min    Activity Tolerance Patient tolerated treatment well    Behavior During Therapy 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   Family history of breast cancer 03/20/2022   Family history of pancreatic cancer 03/20/2022   HIV infection (HCC) 1993   Hypertension    Past Surgical History:  Procedure Laterality Date   ABDOMINAL HYSTERECTOMY     APPENDECTOMY     BREAST BIOPSY Right 2015   BREAST BIOPSY Left 08/26/2022   [place] LT RADIOACTIVE SEED LOC 08/26/2022 [facility]   BREAST BIOPSY  08/26/2022   MM LT RADIOACTIVE SEED LOC MAMMO GUIDE 08/26/2022 [facility]   BREAST LUMPECTOMY WITH RADIOACTIVE SEED LOCALIZATION Left 08/27/2022   Procedure: LEFT BREAST LUMPECTOMY WITH RADIOACTIVE SEED LOCALIZATION;  Surgeon: Abigail Miyamoto, MD;  Location: [facility];  Service: General;  Laterality: Left;   CYSTOSCOPY/RETROGRADE/URETEROSCOPY Right 11/14/2016   Procedure: CYSTOSCOPY/RETROGRADE/URETEROSCOPY/ STONE EXTRACTION/HOLMIUM LASER/STENT PLACEMENT;  Surgeon: Ihor Gully, MD;  Location: [facility];  Service: Urology;  Laterality: Right;   PORT-A-CATH REMOVAL Right 08/27/2022   Procedure: REMOVAL PORT-A-CATH;  Surgeon: Abigail Miyamoto, MD;  Location: [facility];  Service: General;  Laterality: Right;   PORTACATH PLACEMENT Right 03/26/2022   Procedure: INSERTION PORT-A-CATH;  Surgeon: Abigail Miyamoto, MD;  Location: [facility];  Service: General;  Laterality:  Right;   RADIOACTIVE SEED GUIDED AXILLARY SENTINEL LYMPH NODE Left 08/27/2022   Procedure: RADIOACTIVE SEED GUIDED LEFT AXILLARY SENTINEL LYMPH NODE DISSECTION;  Surgeon: Abigail Miyamoto, MD;  [facility];  Service: General;  Laterality: Left;   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  3 of 4 lymph nodes positive. Neoadjuvant chemotherapy completed and will be having radiation. It is ER positive, PR negative, and HER2 negative with a Ki67 of 10%. She has a positive axillary node. She also has HIV disease which she has not shared with her family.   PRECAUTIONS: left UE Lymphedema risk, None  SUBJECTIVE: Pt returns for her 3 month L-Dex screen.   PAIN:  Are you having pain? No  SOZO SCREENING: Patient was assessed today using the SOZO machine to determine the lymphedema index score. This was compared to her baseline score. It was determined that she is within the recommended range when compared to her baseline and no further action is needed at this time. She will continue SOZO screenings. These are done every  3 months for 2 years post operatively followed by every 6 months for 2 years, and then annually.   L-DEX FLOWSHEETS - 06/08/23 1500       L-DEX LYMPHEDEMA SCREENING   Measurement Type Unilateral    L-DEX MEASUREMENT EXTREMITY Upper Extremity    POSITION  Standing    DOMINANT SIDE Right    At Risk Side Left    BASELINE SCORE (UNILATERAL) 0.2    L-DEX SCORE (UNILATERAL) 0.4    VALUE CHANGE (UNILAT) 0.2               Hermenia Bers, PTA 06/08/2023, 3:40 PM

## 2023-06-24 ENCOUNTER — Other Ambulatory Visit (HOSPITAL_COMMUNITY): Payer: Self-pay

## 2023-06-24 ENCOUNTER — Other Ambulatory Visit: Payer: Self-pay

## 2023-06-24 NOTE — Progress Notes (Signed)
Specialty Pharmacy Refill Coordination Note  Veronica Robles is a 67 y.o. female contacted today regarding refills of specialty medication(s) Emtricitabine-Tenofovir Af; Fostemsavir Tromethamine   Patient requested [name] at [facility] [place] date: 07/06/23   Medication will be filled on 07/03/23.

## 2023-07-06 ENCOUNTER — Other Ambulatory Visit (HOSPITAL_COMMUNITY): Payer: Self-pay

## 2023-07-10 ENCOUNTER — Other Ambulatory Visit (HOSPITAL_COMMUNITY): Payer: Self-pay

## 2023-07-23 ENCOUNTER — Other Ambulatory Visit: Payer: Self-pay

## 2023-07-23 NOTE — Progress Notes (Signed)
Specialty Pharmacy Refill Coordination Note  Veronica Robles is a 67 y.o. female contacted today regarding refills of specialty medication(s) Emtricitabine-Tenofovir AF (Descovy); Fostemsavir Tromethamine [name])   Patient requested [name] at [facility] at [place] date: 08/03/23   Medication will be filled on 07/31/23.

## 2023-07-27 ENCOUNTER — Telehealth: Payer: Self-pay | Admitting: Hematology and Oncology

## 2023-07-27 NOTE — Telephone Encounter (Signed)
Left patient a message in regards to rescheduled appointment times/dates due to provider being in Breast Clinic

## 2023-07-31 ENCOUNTER — Other Ambulatory Visit: Payer: Self-pay

## 2023-07-31 ENCOUNTER — Other Ambulatory Visit (HOSPITAL_COMMUNITY): Payer: Self-pay

## 2023-08-06 ENCOUNTER — Telehealth: Payer: Self-pay

## 2023-08-06 ENCOUNTER — Encounter: Payer: Self-pay | Admitting: Hematology and Oncology

## 2023-08-06 ENCOUNTER — Other Ambulatory Visit (HOSPITAL_COMMUNITY): Payer: Self-pay

## 2023-08-06 NOTE — Telephone Encounter (Signed)
 [facility] Patient Advocate Encounter   I was successful in securing patient a $5,000.00 grant from [employer] ([employer]) to provide copayment coverage for Sunlenca , Descovy , Rukobia .  This will make the out of pocket cost $0.00.     I have spoken with the patient.    The billing information is as follows and has been shared with [facility].          Patient knows to call the office with questions or concerns.  Arland Hutchinson, CPhT Specialty Pharmacy Patient [facility] Phone: [phone] Fax:  [phone]

## 2023-08-13 ENCOUNTER — Encounter: Payer: Self-pay | Admitting: Hematology and Oncology

## 2023-08-18 ENCOUNTER — Ambulatory Visit: Payer: Medicare Other | Admitting: Internal Medicine

## 2023-08-20 ENCOUNTER — Ambulatory Visit: Payer: Medicare Other

## 2023-08-20 ENCOUNTER — Ambulatory Visit: Payer: Medicare Other | Admitting: Internal Medicine

## 2023-08-20 DIAGNOSIS — Z0000 Encounter for general adult medical examination without abnormal findings: Secondary | ICD-10-CM

## 2023-08-20 NOTE — Progress Notes (Signed)
Subjective:   Veronica Robles is a 68 y.o. female who presents for Medicare Annual (Subsequent) preventive examination.  Visit Complete: Virtual I connected with  Veronica Robles on 08/20/23 by a audio enabled telemedicine application and verified that I am speaking with the correct person using two identifiers.  Interactive audio and video telecommunications were attempted between this provider and patient, however failed, due to patient having technical difficulties OR patient did not have access to video capability.  We continued and completed visit with audio only.  Patient Location: Home  Provider Location: Home Office  I discussed the limitations of evaluation and management by telemedicine. The patient expressed understanding and agreed to proceed.  Vital Signs: Because this visit was a virtual/telehealth visit, some criteria may be missing or patient reported. Any vitals not documented were not able to be obtained and vitals that have been documented are patient reported.    Cardiac Risk Factors include: advanced age (>55men, >57 women);hypertension     Objective:    Today's Vitals   There is no height or weight on file to calculate BMI.     08/20/2023    4:24 PM 02/23/2023    3:35 PM 09/24/2022    8:21 AM 09/23/2022    1:42 PM 09/10/2022   10:33 AM 08/27/2022    7:53 AM 08/20/2022    2:05 PM  Advanced Directives  Does Patient Have a Medical Advance Directive? No No No No No No No  Copy of Healthcare Power of Attorney in Chart?     No - copy requested No - copy requested No - copy requested  Would patient like information on creating a medical advance directive?  No - Patient declined   No - Patient declined No - Patient declined No - Patient declined    Current Medications (verified) Outpatient Encounter Medications as of 08/20/2023  Medication Sig   anastrozole (ARIMIDEX) 1 MG tablet Take 1 tablet (1 mg total) by mouth daily.   Blood Pressure Monitoring (ADULT  BLOOD PRESSURE CUFF LG) KIT     emtricitabine-tenofovir AF (DESCOVY) 200-25 MG tablet Take 1 tablet by mouth daily.   fostemsavir tromethamine (RUKOBIA) 600 MG TB12 ER tablet Take 1 tablet (600 mg total) by mouth 2 (two) times daily.   hydrochlorothiazide (HYDRODIURIL) 25 MG tablet TAKE 1 TABLET(25 MG) BY MOUTH DAILY   polyethylene glycol powder (MIRALAX) 17 GM/SCOOP powder Take 17 g by mouth daily.   SQ injection lenacapavir (SUNLENCA) 463.5 MG/1.5ML SQ injection Inject 3 mLs (927 mg total) into the skin every 6 (six) months. Administer each injection subcutaneously at separate sites in the abdomen (more or equal to 2 inches from the navel).   No facility-administered encounter medications on file as of 08/20/2023.    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   Family history of breast cancer 03/20/2022   Family history of pancreatic cancer 03/20/2022   HIV infection (HCC) 1993   Hypertension    Past Surgical History:  Procedure Laterality Date   ABDOMINAL HYSTERECTOMY     APPENDECTOMY     BREAST BIOPSY Right 2015   BREAST BIOPSY Left 08/26/2022   [place] LT RADIOACTIVE SEED LOC 08/26/2022 [facility]   BREAST BIOPSY  08/26/2022   MM LT RADIOACTIVE SEED LOC MAMMO GUIDE 08/26/2022 [facility]   BREAST LUMPECTOMY WITH RADIOACTIVE SEED LOCALIZATION Left 08/27/2022   Procedure: LEFT  BREAST LUMPECTOMY WITH RADIOACTIVE SEED LOCALIZATION;  Surgeon: Abigail Miyamoto, MD;  Location: [facility];  Service: General;  Laterality: Left;   CYSTOSCOPY/RETROGRADE/URETEROSCOPY Right 11/14/2016   Procedure: CYSTOSCOPY/RETROGRADE/URETEROSCOPY/ STONE EXTRACTION/HOLMIUM LASER/STENT PLACEMENT;  Surgeon: Ihor Gully, MD;  Location: [facility];  Service: Urology;  Laterality: Right;   PORT-A-CATH REMOVAL Right 08/27/2022   Procedure: REMOVAL PORT-A-CATH;  Surgeon: Abigail Miyamoto, MD;   Location: [facility];  Service: General;  Laterality: Right;   PORTACATH PLACEMENT Right 03/26/2022   Procedure: INSERTION PORT-A-CATH;  Surgeon: Abigail Miyamoto, MD;  Location: [facility];  Service: General;  Laterality: Right;   RADIOACTIVE SEED GUIDED AXILLARY SENTINEL LYMPH NODE Left 08/27/2022   Procedure: RADIOACTIVE SEED GUIDED LEFT AXILLARY SENTINEL LYMPH NODE DISSECTION;  Surgeon: Abigail Miyamoto, MD;  Location: [facility];  Service: General;  Laterality: Left;   Family History  Problem Relation Age of Onset   Hypertension Mother    Pancreatic cancer Father 104   Breast cancer Sister        dx 54s   Lung cancer Sister        d. 28   Social History   Socioeconomic History   Marital status: Single    Spouse name: Not on file   Number of children: Not on file   Years of education: Not on file   Highest education level: Not on file  Occupational History   Not on file  Tobacco Use   Smoking status: Former    Current packs/day: 0.10    Types: Cigarettes   Smokeless tobacco: Never  Vaping Use   Vaping status: Never Used  Substance and Sexual Activity   Alcohol use: No    Alcohol/week: 0.0 standard drinks of alcohol   Drug use: No   Sexual activity: Not Currently    Partners: Male    Birth control/protection: Surgical    Comment: declined condoms  Other Topics Concern   Not on file  Social History Narrative   Not on file   Social Drivers of Health   Financial Resource Strain: Low Risk  (08/20/2023)   Overall Financial Resource Strain (CARDIA)    Difficulty of Paying Living Expenses: Not hard at all  Food Insecurity: No Food Insecurity (08/20/2023)   Hunger Vital Sign    Worried About Running Out of Food in the Last Year: Never true    Ran Out of Food in the Last Year: Never true  Transportation Needs: No Transportation Needs (08/20/2023)   PRAPARE - Administrator, Civil Service (Medical): No    Lack of  Transportation (Non-Medical): No  Physical Activity: Insufficiently Active (08/20/2023)   Exercise Vital Sign    Days of Exercise per Week: 7 days    Minutes of Exercise per Session: 10 min  Stress: No Stress Concern Present (08/20/2023)   [employer] of Occupational Health - Occupational Stress Questionnaire    Feeling of Stress : Not at all  Social Connections: Moderately Isolated (08/20/2023)   Social Connection and Isolation Panel [NHANES]    Frequency of Communication with Friends and Family: More than three times a week    Frequency of Social Gatherings with Friends and Family: More than three times a week    Attends Religious Services: More than 4 times per year    Active Member of [employer] or Organizations: No    Attends Banker Meetings: Never    Marital Status: Never married    Tobacco Counseling  Counseling given: Not Answered   Clinical Intake:  Pre-visit preparation completed: Yes  Pain : No/denies pain     Nutritional Risks: None Diabetes: No  How often do you need to have someone help you when you read instructions, pamphlets, or other written materials from your doctor or pharmacy?: 1 - Never  Interpreter Needed?: No  Information entered by :: NAllen LPN   Activities of Daily Living    08/20/2023    4:17 PM 08/27/2022    7:57 AM  In your present state of health, do you have any difficulty performing the following activities:  Hearing? 0 0  Vision? 0 0  Difficulty concentrating or making decisions? 0 0  Walking or climbing stairs? 0 0  Dressing or bathing? 0 0  Doing errands, shopping? 0   Preparing Food and eating ? N   Using the Toilet? N   In the past six months, have you accidently leaked urine? N   Do you have problems with loss of bowel control? N   Managing your Medications? N   Managing your Finances? N   Housekeeping or managing your Housekeeping? N     Patient Care Team: Georganna Skeans, MD as PCP - General (Family  Medicine) Judyann Munson, MD as PCP - Infectious Diseases (Infectious Diseases) Serena Croissant, MD as Consulting Physician (Hematology and Oncology) Dorothy Puffer, MD as Consulting Physician (Radiation Oncology) Abigail Miyamoto, MD as Consulting Physician (General Surgery)  Indicate any recent Medical Services you may have received from other than [facility] providers in the past year (date may be approximate).     Assessment:   This is a routine wellness examination for [place].  Hearing/Vision screen Hearing Screening - Comments:: Denies hearing issues Vision Screening - Comments:: Ne regular eye exams,   Goals Addressed             This Visit's Progress    Patient Stated       08/20/2023, get eyes checked and go to the dentist       Depression Screen    08/20/2023    4:25 PM 05/06/2023    2:47 PM 02/12/2023    2:10 PM 11/04/2022    2:38 PM 08/14/2022   10:31 AM 08/12/2022    3:02 PM 06/09/2022    3:03 PM  PHQ 2/9 Scores  PHQ - 2 Score 0 0 0 0 0 0 0  PHQ- 9 Score    0       Fall Risk    08/20/2023    4:25 PM 02/12/2023    2:10 PM 08/14/2022   10:31 AM 08/12/2022    3:02 PM 06/09/2022    2:49 PM  Fall Risk   Falls in the past year? 0 0 0 0 0  Number falls in past yr: 0 0 0 0   Injury with Fall? 0 0 0 0   Risk for fall due to : No Fall Risks;Medication side effect No Fall Risks Medication side effect  No Fall Risks  Follow up Falls prevention discussed;Falls evaluation completed Falls evaluation completed Falls prevention discussed;Education provided;Falls evaluation completed  Falls evaluation completed    MEDICARE RISK AT HOME: Medicare Risk at Home Any stairs in or around the home?: No If so, are there any without handrails?: No Home free of loose throw rugs in walkways, pet beds, electrical cords, etc?: Yes Adequate lighting in your home to reduce risk of falls?: Yes Life alert?: No Use of a cane, walker or w/c?: No  Grab bars in the bathroom?: No Shower chair or  bench in shower?: No Elevated toilet seat or a handicapped toilet?: No  TIMED UP AND GO:  Was the test performed?  No    Cognitive Function:        08/20/2023    4:26 PM 08/14/2022   10:32 AM  6CIT Screen  What Year? 0 points 0 points  What month? 0 points 0 points  What time? 0 points 0 points  Count back from 20 0 points 0 points  Months in reverse 2 points 0 points  Repeat phrase 2 points 0 points  Total Score 4 points 0 points    Immunizations Immunization History  Administered Date(s) Administered   Fluad Quad(high Dose 65+) 04/29/2022   Fluad Trivalent(High Dose 65+) 04/29/2023   Hepatitis B 02/02/2013   Hepatitis B, ADULT 03/04/2013, 09/06/2013   Influenza Split 05/12/2011, 05/21/2012   Influenza Whole 05/09/2008, 05/15/2009, 04/25/2010   Influenza,inj,Quad PF,6+ Mos 06/02/2013, 06/13/2014, 05/24/2015, 03/27/2016, 05/11/2017, 05/10/2018, 05/09/2019   PFIZER Comirnaty(Gray Top)Covid-19 Tri-Sucrose Vaccine 02/19/2021   PFIZER(Purple Top)SARS-COV-2 Vaccination 09/29/2019, 10/29/2019, 05/28/2020   Pfizer(Comirnaty)Fall Seasonal Vaccine 12 years and older 05/13/2022, 04/29/2023   Pneumococcal Conjugate-13 05/10/2018   Pneumococcal Polysaccharide-23 05/02/2005, 09/12/2010, 10/13/2017   Td 04/27/2002   Tdap 12/02/2016    TDAP status: Up to date  Flu Vaccine status: Up to date  Pneumococcal vaccine status: Due, Education has been provided regarding the importance of this vaccine. Advised may receive this vaccine at local pharmacy or Health Dept. Aware to provide a copy of the vaccination record if obtained from local pharmacy or Health Dept. Verbalized acceptance and understanding.  Covid-19 vaccine status: Completed vaccines  Qualifies for Shingles Vaccine? Yes   Zostavax completed No   Shingrix Completed?: No.    Education has been provided regarding the importance of this vaccine. Patient has been advised to call insurance company to determine out of pocket  expense if they have not yet received this vaccine. Advised may also receive vaccine at local pharmacy or Health Dept. Verbalized acceptance and understanding.  Screening Tests Health Maintenance  Topic Date Due   Colonoscopy  Never done   COVID-19 Vaccine (7 - 2024-25 season) 06/24/2023   Pneumonia Vaccine 41+ Years old (4 of 4 - PPSV23 or PCV20) 05/05/2024 (Originally 10/14/2022)   Medicare Annual Wellness (AWV)  08/19/2024   MAMMOGRAM  04/13/2025   DTaP/Tdap/Td (3 - Td or Tdap) 12/03/2026   INFLUENZA VACCINE  Completed   DEXA SCAN  Completed   Hepatitis C Screening  Completed   HPV VACCINES  Aged Out   Zoster Vaccines- Shingrix  Discontinued    Health Maintenance  Health Maintenance Due  Topic Date Due   Colonoscopy  Never done   COVID-19 Vaccine (7 - 2024-25 season) 06/24/2023    Colorectal cancer screening: Type of screening: Cologuard. Completed 05/14/2023. Repeat every 3 years  Mammogram status: Completed 04/14/2023. Repeat every year  Bone Density status: Completed 11/25/2022.   Lung Cancer Screening: (Low Dose CT Chest recommended if Age 36-80 years, 20 pack-year currently smoking OR have quit w/in 15years.) does not qualify.   Lung Cancer Screening Referral: no  Additional Screening:  Hepatitis C Screening: does qualify; Completed 05/02/2013  Vision Screening: Recommended annual ophthalmology exams for early detection of glaucoma and other disorders of the eye. Is the patient up to date with their annual eye exam?  No  Who is the provider or what is the name of the office in which the  patient attends annual eye exams? none If pt is not established with a provider, would they like to be referred to a provider to establish care? No .   Dental Screening: Recommended annual dental exams for proper oral hygiene  Diabetic Foot Exam: n/a  Community Resource Referral / Chronic Care Management: CRR required this visit?  No   CCM required this visit?  No     Plan:      I have personally reviewed and noted the following in the patient's chart:   Medical and social history Use of alcohol, tobacco or illicit drugs  Current medications and supplements including opioid prescriptions. Patient is not currently taking opioid prescriptions. Functional ability and status Nutritional status Physical activity Advanced directives List of other physicians Hospitalizations, surgeries, and ER visits in previous 12 months Vitals Screenings to include cognitive, depression, and falls Referrals and appointments  In addition, I have reviewed and discussed with patient certain preventive protocols, quality metrics, and best practice recommendations. A written personalized care plan for preventive services as well as general preventive health recommendations were provided to patient.     Barb Merino, LPN   1/61/0960   After Visit Summary: (Pick Up) Due to this being a telephonic visit, with patients personalized plan was offered to patient and patient has requested to Pick up at office.  Nurse Notes: none

## 2023-08-20 NOTE — Patient Instructions (Signed)
Ms. Veronica Robles , Thank you for taking time to come for your Medicare Wellness Visit. I appreciate your ongoing commitment to your health goals. Please review the following plan we discussed and let me know if I can assist you in the future.   Referrals/Orders/Follow-Ups/Clinician Recommendations: none  This is a list of the screening recommended for you and due dates:  Health Maintenance  Topic Date Due   Colon Cancer Screening  Never done   COVID-19 Vaccine (7 - 2024-25 season) 06/24/2023   Pneumonia Vaccine (4 of 4 - PPSV23 or PCV20) 05/05/2024*   Medicare Annual Wellness Visit  08/19/2024   Mammogram  04/13/2025   DTaP/Tdap/Td vaccine (3 - Td or Tdap) 12/03/2026   Flu Shot  Completed   DEXA scan (bone density measurement)  Completed   Hepatitis C Screening  Completed   HPV Vaccine  Aged Out   Zoster (Shingles) Vaccine  Discontinued  *Topic was postponed. The date shown is not the original due date.    Advanced directives: (ACP Link)Information on Advanced Care Planning can be found at [facility] [place] Advance Health Care Directives Advance Health Care Directives ([email or web address]   Next Medicare Annual Wellness Visit scheduled for next year: Yes  insert Preventive Care attachment Insert FALL PREVENTION attachment if needed

## 2023-08-21 ENCOUNTER — Other Ambulatory Visit: Payer: Self-pay

## 2023-08-21 ENCOUNTER — Other Ambulatory Visit (HOSPITAL_COMMUNITY): Payer: Self-pay

## 2023-08-26 ENCOUNTER — Inpatient Hospital Stay: Payer: Medicare Other | Attending: Hematology and Oncology | Admitting: Hematology and Oncology

## 2023-08-26 ENCOUNTER — Ambulatory Visit: Payer: Medicare Other | Admitting: Hematology and Oncology

## 2023-08-26 VITALS — BP 140/71 | HR 77 | Temp 97.7°F | Resp 18 | Ht 62.0 in | Wt 133.3 lb

## 2023-08-26 DIAGNOSIS — Z79811 Long term (current) use of aromatase inhibitors: Secondary | ICD-10-CM | POA: Insufficient documentation

## 2023-08-26 DIAGNOSIS — N6489 Other specified disorders of breast: Secondary | ICD-10-CM | POA: Diagnosis not present

## 2023-08-26 DIAGNOSIS — Z1722 Progesterone receptor negative status: Secondary | ICD-10-CM | POA: Insufficient documentation

## 2023-08-26 DIAGNOSIS — N6325 Unspecified lump in the left breast, overlapping quadrants: Secondary | ICD-10-CM | POA: Diagnosis not present

## 2023-08-26 DIAGNOSIS — C50412 Malignant neoplasm of upper-outer quadrant of left female breast: Secondary | ICD-10-CM | POA: Insufficient documentation

## 2023-08-26 DIAGNOSIS — C773 Secondary and unspecified malignant neoplasm of axilla and upper limb lymph nodes: Secondary | ICD-10-CM | POA: Insufficient documentation

## 2023-08-26 DIAGNOSIS — Z170 Estrogen receptor positive status [ER+]: Secondary | ICD-10-CM | POA: Diagnosis not present

## 2023-08-26 DIAGNOSIS — Z79624 Long term (current) use of inhibitors of nucleotide synthesis: Secondary | ICD-10-CM | POA: Diagnosis not present

## 2023-08-26 DIAGNOSIS — Z880 Allergy status to penicillin: Secondary | ICD-10-CM | POA: Diagnosis not present

## 2023-08-26 DIAGNOSIS — Z79899 Other long term (current) drug therapy: Secondary | ICD-10-CM | POA: Insufficient documentation

## 2023-08-26 NOTE — Progress Notes (Signed)
Patient Care Team: Georganna Skeans, MD as PCP - General (Family Medicine) Judyann Munson, MD as PCP - Infectious Diseases (Infectious Diseases) Serena Croissant, MD as Consulting Physician (Hematology and Oncology) Dorothy Puffer, MD as Consulting Physician (Radiation Oncology) Abigail Miyamoto, MD as Consulting Physician (General Surgery)  DIAGNOSIS:  Encounter Diagnosis  Name Primary?   Malignant neoplasm of upper-outer quadrant of left breast in female, estrogen receptor positive (HCC) Yes    SUMMARY OF ONCOLOGIC HISTORY: Oncology History  Malignant neoplasm of upper-outer quadrant of left breast in female, estrogen receptor positive (HCC)  02/28/2022 Initial Diagnosis   Screening detected left breast mass at 3 o'clock position measuring 0.9 cm, 4 enlarged left axillary lymph nodes along with asymmetric ducts between mass and the nipple possibly DCIS; lymph node biopsy: Positive, breast biopsy: Grade 2 IDC ER 100%, PR 0%, HER2 negative, Ki-67 10%   03/10/2022 Cancer Staging   Staging form: Breast, AJCC 8th Edition - Clinical stage from 03/10/2022: Stage IIA (cT1b, cN1(f), cM0, G2, ER+, PR-, HER2-) - Signed by Ronny Bacon, PA-C on 03/10/2022 Stage prefix: Initial diagnosis Method of lymph node assessment: Core biopsy Histologic grading system: 3 grade system   03/27/2022 - 08/08/2022 Chemotherapy   Patient is on Treatment Plan : BREAST ADJUVANT DOSE DENSE AC q14d / PACLitaxel q7d      08/27/2022 Surgery   Left lumpectomy: Grade 2 IDC 1.9 cm, margins negative, 3/4 lymph nodes positive ER 100%, PR 0%, HER2 negative, Ki-67 10%    10/02/2022 - 11/17/2022 Radiation Therapy   Plan Name: Breast_L_BH Site: Breast, Left Technique: 3D Mode: Photon Dose Per Fraction: 1.8 Gy Prescribed Dose (Delivered / Prescribed): 50.4 Gy / 50.4 Gy Prescribed Fxs (Delivered / Prescribed): 28 / 28   Plan Name: Brst_L_SCV_BH Site: Breast, Left Technique: 3D Mode: Photon Dose Per Fraction: 1.8  Gy Prescribed Dose (Delivered / Prescribed): 50.4 Gy / 50.4 Gy Prescribed Fxs (Delivered / Prescribed): 28 / 28   Plan Name: Brst_L_Bst_BH Site: Breast, Left Technique: 3D Mode: Photon Dose Per Fraction: 2 Gy Prescribed Dose (Delivered / Prescribed): 10 Gy / 10 Gy Prescribed Fxs (Delivered / Prescribed): 5 / 5   11/2022 -  Anti-estrogen oral therapy   Anastrozole daily     CHIEF COMPLIANT: Follow-up on anastrozole therapy  HISTORY OF PRESENT ILLNESS:  History of Present Illness   The patient, with breast cancer, presents for a routine follow-up regarding her treatment.  The patient is currently undergoing treatment for breast cancer and is taking anastrozole daily without experiencing any side effects. She is also on Verzenio (abemaciclib) and has tolerated it well, with no significant side effects reported.  Because of concerns for diarrhea she did not take Verzinio.  She mentioned a recent incident involving her spouse, who slipped on a wet floor while cleaning up after their dog, resulting in a sore knee for her spouse. No further details about her own health impact from this incident were provided.         ALLERGIES:  is allergic to penicillins.  MEDICATIONS:  Current Outpatient Medications  Medication Sig Dispense Refill   anastrozole (ARIMIDEX) 1 MG tablet Take 1 tablet (1 mg total) by mouth daily. 90 tablet 3   Blood Pressure Monitoring (ADULT BLOOD PRESSURE CUFF LG) KIT       emtricitabine-tenofovir AF (DESCOVY) 200-25 MG tablet Take 1 tablet by mouth daily. 30 tablet 5   fostemsavir tromethamine (RUKOBIA) 600 MG TB12 ER tablet Take 1 tablet (600 mg total) by mouth  2 (two) times daily. 60 tablet 5   hydrochlorothiazide (HYDRODIURIL) 25 MG tablet TAKE 1 TABLET(25 MG) BY MOUTH DAILY 90 tablet 1   polyethylene glycol powder (MIRALAX) 17 GM/SCOOP powder Take 17 g by mouth daily. 850 g 1   SQ injection lenacapavir (SUNLENCA) 463.5 MG/1.5ML SQ injection Inject 3 mLs (927  mg total) into the skin every 6 (six) months. Administer each injection subcutaneously at separate sites in the abdomen (more or equal to 2 inches from the navel). 3 mL 2   No current facility-administered medications for this visit.    PHYSICAL EXAMINATION: ECOG PERFORMANCE STATUS: 1 - Symptomatic but completely ambulatory  Vitals:   08/26/23 1118  BP: (!) 140/71  Pulse: 77  Resp: 18  Temp: 97.7 F (36.5 C)  SpO2: 100%   Filed Weights   08/26/23 1118  Weight: 133 lb 4.8 oz (60.5 kg)    Physical Exam   BREAST: No abnormalities identified, presence of scar tissue noted. SKIN: Pigmentation observed, assessed as normal.      (exam performed in the presence of a chaperone)  LABORATORY DATA:  I have reviewed the data as listed    Latest Ref Rng & Units 02/03/2023    1:40 AM 11/18/2022    2:11 AM 08/08/2022    8:45 AM  CMP  Glucose 65 - 99 mg/dL 93  409  99   BUN 7 - 25 mg/dL 10  11  10    Creatinine 0.50 - 1.05 mg/dL 8.11  9.14  7.82   Sodium 135 - 146 mmol/L 140  140  140   Potassium 3.5 - 5.3 mmol/L 3.9  3.7  3.9   Chloride 98 - 110 mmol/L 101  101  108   CO2 20 - 32 mmol/L 29  31  29    Calcium 8.6 - 10.4 mg/dL 9.8  9.5  9.1   Total Protein 6.1 - 8.1 g/dL 6.7  6.4  5.9   Total Bilirubin 0.2 - 1.2 mg/dL 0.4  0.5  0.3   Alkaline Phos 38 - 126 U/L   58   AST 10 - 35 U/L 20  21  21    ALT 6 - 29 U/L 15  17  25      Lab Results  Component Value Date   WBC 5.4 02/03/2023   HGB 12.3 02/03/2023   HCT 37.7 02/03/2023   MCV 85.9 02/03/2023   PLT 145 02/03/2023   NEUTROABS 3,510 02/03/2023    ASSESSMENT & PLAN:  Malignant neoplasm of upper-outer quadrant of left breast in female, estrogen receptor positive (HCC) 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; lymph node biopsy: Positive, breast biopsy: Grade 2 IDC ER 100%, PR 0%,  HER2 negative, Ki-67 10%   Treatment plan: 1.  Neoadjuvant chemotherapy with dose dense Adriamycin and Cytoxan x4 followed by Taxol weekly x12 completed 08/08/2022 2. breast conserving surgery with targeted node dissection 08/27/2022: Left lumpectomy: Grade 2 IDC 1.9 cm, margins negative, 3/4 lymph nodes positive ER 100%, PR 0%, HER2 negative, Ki-67 10% 3.  Adjuvant radiation completed 11/17/2022 4.  Follow-up antiestrogen therapy with anastrozole (started May 2024) and Verzenio (patient refused) --------------------------------------------------------------------------------------------------------------------------------- Treatment plan: Adjuvant antiestrogen therapy with anastrozole 1 mg daily x 7 years   Anastrozole toxicities: Tolerating it extremely well without any problems or concerns  Assessment and Plan    Breast Cancer Follow-Up Tolerating  Verzenio well with no reported side effects. Has not started the other medication due to concerns about diarrhea, a known side effect. Anastrozole is being taken daily without issues. Physical exam of the breast shows normal findings with some pigmentation noted, which is not concerning. Discussed the importance of continuing current medications and the need for annual mammograms. - Continue Verzenio - Continue anastrozole daily - Perform annual mammograms in September - Schedule next follow-up visit in one year  General Health Maintenance Generally doing well and advised to keep up with routine health maintenance. - Cancel July appointment - Scan [employer] card at the front desk.     Return to clinic in 1 year for follow-up     No orders of the defined types were placed in this encounter.  The patient has a good understanding of the overall plan. she agrees with it. she will call with any problems that may develop before the next visit here. Total time spent: 30 mins including face to face time and time spent for planning, charting and  co-ordination of care   Tamsen Meek, MD 08/26/23

## 2023-08-26 NOTE — Assessment & Plan Note (Signed)
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  Neoadjuvant chemotherapy with dose dense Adriamycin and Cytoxan x4 followed by Taxol weekly x12 completed 08/08/2022 2. breast conserving surgery with targeted node dissection 08/27/2022: Left lumpectomy: Grade 2 IDC 1.9 cm, margins negative, 3/4 lymph nodes positive ER 100%, PR 0%, HER2 negative, Ki-67 10% 3.  Adjuvant radiation completed 11/17/2022 4.  Follow-up antiestrogen therapy with anastrozole (started May 2024) and Verzenio (started August 2024) --------------------------------------------------------------------------------------------------------------------------------- Treatment plan: Adjuvant antiestrogen therapy with anastrozole 1 mg daily x 7 years   Verzenio toxicities:  Return to clinic in 3 months with labs and follow-up with Jonny Ruiz.  I will see the patient back in 6 months with labs and follow-up

## 2023-08-27 ENCOUNTER — Other Ambulatory Visit: Payer: Self-pay

## 2023-08-27 ENCOUNTER — Encounter: Payer: Self-pay | Admitting: Internal Medicine

## 2023-08-27 ENCOUNTER — Other Ambulatory Visit (HOSPITAL_COMMUNITY): Payer: Self-pay

## 2023-08-27 ENCOUNTER — Ambulatory Visit (INDEPENDENT_AMBULATORY_CARE_PROVIDER_SITE_OTHER): Payer: Medicare Other | Admitting: Internal Medicine

## 2023-08-27 VITALS — BP 130/84 | HR 92 | Resp 16 | Ht 62.0 in | Wt 134.0 lb

## 2023-08-27 DIAGNOSIS — Z87891 Personal history of nicotine dependence: Secondary | ICD-10-CM | POA: Diagnosis not present

## 2023-08-27 DIAGNOSIS — B20 Human immunodeficiency virus [HIV] disease: Secondary | ICD-10-CM | POA: Diagnosis not present

## 2023-08-27 DIAGNOSIS — Z79899 Other long term (current) drug therapy: Secondary | ICD-10-CM

## 2023-08-27 DIAGNOSIS — I10 Essential (primary) hypertension: Secondary | ICD-10-CM

## 2023-08-27 MED ORDER — RUKOBIA 600 MG PO TB12
600.0000 mg | ORAL_TABLET | Freq: Two times a day (BID) | ORAL | 5 refills | Status: DC
  Filled 2023-08-27 (×2): qty 60, 30d supply, fill #0
  Filled 2023-09-25: qty 60, 30d supply, fill #1
  Filled 2023-10-22: qty 60, 30d supply, fill #2
  Filled 2023-11-23: qty 60, 30d supply, fill #3
  Filled 2023-12-18: qty 60, 30d supply, fill #4
  Filled 2024-01-21: qty 60, 30d supply, fill #5

## 2023-08-27 MED ORDER — DESCOVY 200-25 MG PO TABS
1.0000 | ORAL_TABLET | Freq: Every day | ORAL | 5 refills | Status: DC
Start: 2023-08-27 — End: 2024-02-19
  Filled 2023-08-27 (×2): qty 30, 30d supply, fill #0
  Filled 2023-09-25 – 2023-09-28 (×2): qty 30, 30d supply, fill #1
  Filled 2023-10-22: qty 30, 30d supply, fill #2
  Filled 2023-11-23: qty 30, 30d supply, fill #3
  Filled 2023-12-18: qty 30, 30d supply, fill #4
  Filled 2024-01-21: qty 30, 30d supply, fill #5

## 2023-08-27 NOTE — Progress Notes (Signed)
 Patient ID: Veronica Robles, female   DOB: [birth year], 67 y.o.   MRN: [id number]  HPI 68yo F with history of hiv disease, intermittent adherence, currently on descovy,rukobia and lenacapavir injection Q 3mo. Also had dx with breast ca in jan 2024 s/p  lump resection, chemo. Now on Adjuvant antiestrogen therapy with anastrozole 1 mg daily x 7 years. Saw dr Pamelia Hoit yesterday and felt she was doing well. Next visit is next year. Appetite is improved.  Has only missed 1 dose since she was out in new years.. but otherwise not missing a dose.  Will do 6 months  Outpatient Encounter Medications as of 08/27/2023  Medication Sig   anastrozole (ARIMIDEX) 1 MG tablet Take 1 tablet (1 mg total) by mouth daily.   Blood Pressure Monitoring (ADULT BLOOD PRESSURE CUFF LG) KIT     emtricitabine-tenofovir AF (DESCOVY) 200-25 MG tablet Take 1 tablet by mouth daily.   fostemsavir tromethamine (RUKOBIA) 600 MG TB12 ER tablet Take 1 tablet (600 mg total) by mouth 2 (two) times daily.   hydrochlorothiazide (HYDRODIURIL) 25 MG tablet TAKE 1 TABLET(25 MG) BY MOUTH DAILY   polyethylene glycol powder (MIRALAX) 17 GM/SCOOP powder Take 17 g by mouth daily.   SQ injection lenacapavir (SUNLENCA) 463.5 MG/1.5ML SQ injection Inject 3 mLs (927 mg total) into the skin every 6 (six) months. Administer each injection subcutaneously at separate sites in the abdomen (more or equal to 2 inches from the navel).   No facility-administered encounter medications on file as of 08/27/2023.     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 appears well-developed and well-nourished. No distress.  HENT: [place]/AT, PERRLA, no scleral icterus Mouth/Throat: Oropharynx is clear and moist. No oropharyngeal exudate.  Cardiovascular: Normal rate, regular rhythm and normal heart sounds. Exam reveals no gallop and no friction rub.  No murmur heard.  Pulmonary/Chest: Effort normal and breath sounds normal. No respiratory distress.  has no wheezes.  Neck = supple, no nuchal rigidity Abdominal: Soft. Bowel sounds are normal.  exhibits no distension. There is no tenderness.  Lymphadenopathy: no cervical adenopathy. No axillary adenopathy Neurological: alert and oriented to person, place, and time.  Skin: Skin is warm and dry. No rash noted. No erythema.  Psychiatric: a normal mood and affect.  behavior is normal.   Lab Results  Component  Value Date   CD4TCELL 18 (L) 02/03/2023   Lab Results  Component Value Date   CD4TABS 218 (L) 02/03/2023   CD4TABS 174 (L) 11/18/2022   CD4TABS 128 (L) 08/12/2022   Lab Results  Component Value Date   HIV1RNAQUANT Not Detected 02/03/2023   Lab Results  Component Value Date   HEPBSAB INDETER (A) 08/08/2009   Lab Results  Component Value Date   LABRPR NON-REACTIVE 02/03/2023    CBC Lab Results  Component Value Date   WBC  5.4 02/03/2023   RBC 4.39 02/03/2023   HGB 12.3 02/03/2023   HCT 37.7 02/03/2023   PLT 145 02/03/2023   MCV 85.9 02/03/2023   MCH 28.0 02/03/2023   MCHC 32.6 02/03/2023   RDW 14.1 02/03/2023   LYMPHSABS 1,258 02/03/2023   MONOABS 0.4 08/08/2022   EOSABS 70 02/03/2023    BMET Lab Results  Component Value Date   NA 140 02/03/2023   K 3.9 02/03/2023   CL 101 02/03/2023   CO2 29 02/03/2023   GLUCOSE 93 02/03/2023   BUN 10 02/03/2023   CREATININE 0.78 02/03/2023   CALCIUM 9.8 02/03/2023   GFRNONAA >60 08/08/2022   GFRAA 73 11/01/2020      Assessment and Plan  Hiv disease = will check labs. Gave refills. Lenicapavir injection in march  Long term medication management = will check cr  Hypertension = at goal, no need to change meds  See back in 6 mo

## 2023-08-27 NOTE — Progress Notes (Signed)
Specialty Pharmacy Refill Coordination Note  Veronica Robles is a 68 y.o. female contacted today regarding refills of specialty medication(s) Emtricitabine-Tenofovir AF (Descovy); Fostemsavir Tromethamine [name])   Patient requested [name] at [facility] at [place] date: 08/31/23   Medication will be filled on 08/28/23.

## 2023-08-28 ENCOUNTER — Encounter: Payer: Self-pay | Admitting: Hematology and Oncology

## 2023-08-28 LAB — T-HELPER CELL (CD4) - (RCID CLINIC ONLY)
CD4 % Helper T Cell: 19 % — ABNORMAL LOW (ref 33–65)
CD4 T Cell Abs: 277 /uL — ABNORMAL LOW (ref 400–1790)

## 2023-08-29 ENCOUNTER — Encounter: Payer: Self-pay | Admitting: Hematology and Oncology

## 2023-08-29 LAB — CBC WITH DIFFERENTIAL/PLATELET
Absolute Lymphocytes: 1585 {cells}/uL (ref 850–3900)
Absolute Monocytes: 605 {cells}/uL (ref 200–950)
Basophils Absolute: 22 {cells}/uL (ref 0–200)
Basophils Relative: 0.4 %
Eosinophils Absolute: 50 {cells}/uL (ref 15–500)
Eosinophils Relative: 0.9 %
HCT: 39.1 % (ref 35.0–45.0)
Hemoglobin: 12.7 g/dL (ref 11.7–15.5)
MCH: 28.3 pg (ref 27.0–33.0)
MCHC: 32.5 g/dL (ref 32.0–36.0)
MCV: 87.1 fL (ref 80.0–100.0)
MPV: 9.4 fL (ref 7.5–12.5)
Monocytes Relative: 10.8 %
Neutro Abs: 3338 {cells}/uL (ref 1500–7800)
Neutrophils Relative %: 59.6 %
Platelets: 165 10*3/uL (ref 140–400)
RBC: 4.49 10*6/uL (ref 3.80–5.10)
RDW: 12.9 % (ref 11.0–15.0)
Total Lymphocyte: 28.3 %
WBC: 5.6 10*3/uL (ref 3.8–10.8)

## 2023-08-29 LAB — COMPLETE METABOLIC PANEL WITH GFR
AG Ratio: 1.8 (calc) (ref 1.0–2.5)
ALT: 20 U/L (ref 6–29)
AST: 23 U/L (ref 10–35)
Albumin: 4.2 g/dL (ref 3.6–5.1)
Alkaline phosphatase (APISO): 94 U/L (ref 37–153)
BUN: 13 mg/dL (ref 7–25)
CO2: 32 mmol/L (ref 20–32)
Calcium: 9.5 mg/dL (ref 8.6–10.4)
Chloride: 101 mmol/L (ref 98–110)
Creat: 0.88 mg/dL (ref 0.50–1.05)
Globulin: 2.4 g/dL (ref 1.9–3.7)
Glucose, Bld: 90 mg/dL (ref 65–99)
Potassium: 3.4 mmol/L — ABNORMAL LOW (ref 3.5–5.3)
Sodium: 141 mmol/L (ref 135–146)
Total Bilirubin: 0.4 mg/dL (ref 0.2–1.2)
Total Protein: 6.6 g/dL (ref 6.1–8.1)
eGFR: 72 mL/min/{1.73_m2} (ref >=60)

## 2023-08-29 LAB — RPR: RPR Ser Ql: NONREACTIVE

## 2023-08-29 LAB — HIV-1 RNA QUANT-NO REFLEX-BLD
HIV 1 RNA Quant: 28 {copies}/mL — ABNORMAL HIGH
HIV-1 RNA Quant, Log: 1.44 {Log} — ABNORMAL HIGH

## 2023-08-31 ENCOUNTER — Other Ambulatory Visit (HOSPITAL_COMMUNITY): Payer: Self-pay

## 2023-09-07 ENCOUNTER — Ambulatory Visit: Payer: Medicare Other | Attending: Surgery

## 2023-09-07 VITALS — Wt 133.5 lb

## 2023-09-07 DIAGNOSIS — Z483 Aftercare following surgery for neoplasm: Secondary | ICD-10-CM | POA: Insufficient documentation

## 2023-09-07 NOTE — Therapy (Signed)
 OUTPATIENT PHYSICAL THERAPY SOZO SCREENING NOTE   Patient Name: Veronica Robles MRN: [id number] DOB:[birth year], 68 y.o., female Today's Date: 09/07/2023  PCP: Abraham Abo, MD REFERRING PROVIDER: Oza Blumenthal, MD   PT End of Session - 09/07/23 1543     Visit Number 7   # unchanged due to screen only   PT Start Time 1540    PT Stop Time 1545    PT Time Calculation (min) 5 min    Activity Tolerance Patient tolerated treatment well    Behavior During Therapy [facility] for tasks assessed/performed             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   Family history of breast cancer 03/20/2022   Family history of pancreatic cancer 03/20/2022   HIV infection (HCC) 1993   Hypertension    Past Surgical History:  Procedure Laterality Date   ABDOMINAL HYSTERECTOMY     APPENDECTOMY     BREAST BIOPSY Right 2015   BREAST BIOPSY Left 08/26/2022   US  LT RADIOACTIVE SEED LOC 08/26/2022 [facility]AMMOGRAP[facility]   BREAST BIOPSY  08/26/2022   MM LT RADIOACTIVE SEED LOC MAMMO GUIDE 08/26/2022 [facility] MAMMOGRAPHY   BREAST LUMPECTOMY WITH RADIOACTIVE SEED LOCALIZATION Left 08/27/2022   Procedure: LEFT BREAST LUMPECTOMY WITH RADIOACTIVE SEED LOCALIZATION;  Surgeon: Oza Blumenthal, MD;  Location: [facility];  Service: General;  Laterality: Left;   CYSTOSCOPY/RETROGRADE/URETEROSCOPY Right 11/14/2016   Procedure: CYSTOSCOPY/RETROGRADE/URETEROSCOPY/ STONE EXTRACTION/HOLMIUM LASER/STENT PLACEMENT;  Surgeon: Mark Ottelin, MD;  Location: [facility];  Service: Urology;  Laterality: Right;   PORT-A-CATH REMOVAL Right 08/27/2022   Procedure: REMOVAL PORT-A-CATH;  Surgeon: Oza Blumenthal, MD;  Location: [facility];  Service: General;  Laterality: Right;   PORTACATH PLACEMENT Right 03/26/2022   Procedure: INSERTION PORT-A-CATH;  Surgeon: Oza Blumenthal, MD;  Location: [facility];  Service: General;  Laterality:  Right;   RADIOACTIVE SEED GUIDED AXILLARY SENTINEL LYMPH NODE Left 08/27/2022   Procedure: RADIOACTIVE SEED GUIDED LEFT AXILLARY SENTINEL LYMPH NODE DISSECTION;  Surgeon: Oza Blumenthal, MD;  Location: [facility];  Service: General;  Laterality: Left;   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  3 of 4 lymph nodes positive. Neoadjuvant chemotherapy completed and will be having radiation. It is ER positive, PR negative, and HER2 negative with a Ki67 of 10%. She has a positive axillary node. She also has HIV disease which she has not shared with her family.   PRECAUTIONS: left UE Lymphedema risk, None  SUBJECTIVE: Pt returns for her 3 month L-Dex screen.   PAIN:  Are you having pain? No  SOZO SCREENING: Patient was assessed today using the SOZO machine to determine the lymphedema index score. This was compared to her baseline score. It was determined that she is within the recommended range when compared to her baseline and no further action is needed at this time. She will continue SOZO screenings. These are done every  3 months for 2 years post operatively followed by every 6 months for 2 years, and then annually.   L-DEX FLOWSHEETS - 09/07/23 1500       L-DEX LYMPHEDEMA SCREENING   Measurement Type Unilateral    L-DEX MEASUREMENT EXTREMITY Upper Extremity    POSITION  Standing    DOMINANT SIDE Right    At Risk Side Left    BASELINE SCORE (UNILATERAL) 0.2    L-DEX SCORE (UNILATERAL) -0.2    VALUE CHANGE (UNILAT) -0.4               Denyce Flank, PTA 09/07/2023, 3:45 PM

## 2023-09-25 ENCOUNTER — Other Ambulatory Visit: Payer: Self-pay

## 2023-09-25 NOTE — Progress Notes (Signed)
 Specialty Pharmacy Refill Coordination Note  Veronica Robles is a 68 y.o. female contacted today regarding refills of specialty medication(s) Emtricitabine-Tenofovir AF (Descovy); Fostemsavir Tromethamine [name])   Patient requested Delivery   Pickup date: 09/30/23   Medication will be filled on 09/30/23.

## 2023-09-28 ENCOUNTER — Other Ambulatory Visit (HOSPITAL_COMMUNITY): Payer: Self-pay

## 2023-09-29 ENCOUNTER — Other Ambulatory Visit: Payer: Self-pay

## 2023-09-30 ENCOUNTER — Other Ambulatory Visit: Payer: Self-pay

## 2023-10-16 ENCOUNTER — Other Ambulatory Visit (HOSPITAL_COMMUNITY): Payer: Self-pay

## 2023-10-16 ENCOUNTER — Other Ambulatory Visit: Payer: Self-pay

## 2023-10-16 NOTE — Progress Notes (Signed)
 Specialty Pharmacy Refill Coordination Note  Veronica Robles is a 68 y.o. female assessed today regarding refills of clinic administered specialty medication(s) -- [name])   Clinic requested Courier to Provider Office   Delivery date: 10/20/23   Verified address: [address] [place] [address]   Medication will be filled on 10/19/23.

## 2023-10-19 ENCOUNTER — Other Ambulatory Visit: Payer: Self-pay

## 2023-10-20 ENCOUNTER — Telehealth: Payer: Self-pay

## 2023-10-20 NOTE — Telephone Encounter (Signed)
 [facility] Patient Advocate Encounter  Patient's medications SUNLENCA have been couriered to [facility] from [facility] and will be administered at the patients appointment on 10/28/23.  Kae Heller, CPhT Specialty Pharmacy Patient [facility] Phone: [phone] Fax:  [phone]

## 2023-10-22 ENCOUNTER — Other Ambulatory Visit (HOSPITAL_COMMUNITY): Payer: Self-pay

## 2023-10-22 NOTE — Progress Notes (Signed)
 Specialty Pharmacy Ongoing Clinical Assessment Note  Veronica Robles is a 68 y.o. female who is being followed by the specialty pharmacy service for RxSp HIV   Patient's specialty medication(s) reviewed today: Emtricitabine-Tenofovir AF (Descovy); Fostemsavir Tromethamine [name])   Missed doses in the last 4 weeks: 0   Patient/Caregiver did not have any additional questions or concerns.   Therapeutic benefit summary: Patient is achieving benefit   Adverse events/side effects summary: No adverse events/side effects   Patient's therapy is appropriate to: Continue    Goals Addressed             This Visit's Progress    Achieve Undetectable HIV Viral Load < 20   Worsening    Patient is not on track and no change. Patient will maintain adherence.  Patient's last viral load was 28 copies/mL on 08/27/23, was previously undetectable.         Follow up:  6 months  Servando Snare Specialty Pharmacist

## 2023-10-22 NOTE — Progress Notes (Signed)
 Specialty Pharmacy Refill Coordination Note  Veronica Robles is a 68 y.o. female contacted today regarding refills of specialty medication(s) Emtricitabine-Tenofovir AF (Descovy); Fostemsavir Tromethamine [name])   Patient requested [name] at [facility] at [place] date: 10/30/23   Medication will be filled on 10/29/23.

## 2023-10-26 NOTE — Progress Notes (Unsigned)
 HPI: Veronica Robles is a 68 y.o. female who presents to the [facility] pharmacy clinic for [facility] administration.  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   BLOOD PRESSURE MONITORING (ADULT BLOOD PRESSURE CUFF LG) KIT        EMTRICITABINE-TENOFOVIR AF (DESCOVY) 200-25 MG TABLET    Take 1 tablet by mouth daily.   FOSTEMSAVIR TROMETHAMINE (RUKOBIA) 600 MG TB12 ER TABLET    Take 1 tablet (600 mg total) by mouth 2 (two) times daily.   HYDROCHLOROTHIAZIDE (HYDRODIURIL) 25 MG TABLET    TAKE 1 TABLET(25 MG) BY MOUTH DAILY   POLYETHYLENE GLYCOL POWDER (MIRALAX) 17 GM/SCOOP POWDER    Take 17 g by mouth daily.   SQ INJECTION LENACAPAVIR (SUNLENCA) 463.5 MG/1.5ML SQ INJECTION    Inject 3 mLs (927 mg total) into the skin every 6 (six) months. Administer each injection subcutaneously at separate sites in the abdomen (more or equal to 2 inches from the navel).  Modified Medications   No medications on file  Discontinued Medications   No medications on file    Allergies: Allergies  Allergen Reactions   Penicillins Rash    Has patient  had a PCN reaction causing immediate rash, facial/tongue/throat swelling, SOB or lightheadedness with hypotension: No Has patient had a PCN reaction causing severe rash involving mucus membranes or skin necrosis: No Has patient had a PCN reaction that required hospitalization No Has patient had a PCN reaction occurring within the last 10 years: No If all of the above answers are "NO", then may proceed with Cephalosporin use.     Past Medical 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   Family history of breast cancer 03/20/2022   Family history of pancreatic cancer 03/20/2022   HIV infection (HCC) 1993   Hypertension     Social History: Social History   Socioeconomic History   Marital status: Single    Spouse name: Not on file   Number of children: Not on file   Years of education: Not on file   Highest education level: Not on file  Occupational History   Not on file  Tobacco Use   Smoking status: Former    Current packs/day: 0.10    Types: Cigarettes   Smokeless tobacco: Never  Vaping Use   Vaping status: Never Used  Substance and Sexual Activity   Alcohol use: No    Alcohol/week: 0.0 standard drinks of alcohol   Drug use: No   Sexual activity: Not Currently    Partners: Male    Birth control/protection: Surgical  Comment: declined condoms  Other Topics Concern   Not on file  Social History Narrative   Not on file   Social Drivers of Health   Financial Resource Strain: Low Risk  (08/20/2023)   Overall Financial Resource Strain (CARDIA)    Difficulty of Paying Living Expenses: Not hard at all  Food Insecurity: No Food Insecurity (08/20/2023)   Hunger Vital Sign    Worried About Running Out of Food in the Last Year: Never true    Ran Out of Food in the Last Year: Never true  Transportation Needs: No Transportation Needs (08/20/2023)   PRAPARE - Administrator, Civil Service (Medical): No     Lack of Transportation (Non-Medical): No  Physical Activity: Insufficiently Active (08/20/2023)   Exercise Vital Sign    Days of Exercise per Week: 7 days    Minutes of Exercise per Session: 10 min  Stress: No Stress Concern Present (08/20/2023)   [employer] of Occupational Health - Occupational Stress Questionnaire    Feeling of Stress : Not at all  Social Connections: Moderately Isolated (08/20/2023)   Social Connection and Isolation Panel [NHANES]    Frequency of Communication with Friends and Family: More than three times a week    Frequency of Social Gatherings with Friends and Family: More than three times a week    Attends Religious Services: More than 4 times per year    Active Member of Clubs or Organizations: No    Attends Banker Meetings: Never    Marital Status: Never married    Labs: Lab Results  Component Value Date   HIV1RNAQUANT 28 (H) 08/27/2023   HIV1RNAQUANT Not Detected 02/03/2023   HIV1RNAQUANT <20 (H) 11/18/2022   CD4TABS 277 (L) 08/27/2023   CD4TABS 218 (L) 02/03/2023   CD4TABS 174 (L) 11/18/2022    RPR and STI Lab Results  Component Value Date   LABRPR NON-REACTIVE 08/27/2023   LABRPR NON-REACTIVE 02/03/2023   LABRPR NON-REACTIVE 05/01/2020   LABRPR NON-REACTIVE 05/10/2018   LABRPR NON REAC 10/30/2015    STI Results GC CT  02/03/2023  1:54 PM Negative  Negative   05/01/2020  2:25 PM Negative  Negative   03/04/2016 12:00 AM Negative  Negative   12/05/2013 12:00 AM  CT: Negative     Hepatitis B Lab Results  Component Value Date   HEPBSAB INDETER (A) 08/08/2009   HEPBSAG NEG 08/08/2009   HEPBCAB POS (A) 08/08/2009   Hepatitis C No results found for: "HEPCAB", "HCVRNAPCRQN" Hepatitis A Lab Results  Component Value Date   HAV POS (A) 08/08/2009   Lipids: Lab Results  Component Value Date   CHOL 209 (H) 02/03/2023   TRIG 220 (H) 02/03/2023   HDL 48 (L) 02/03/2023   CHOLHDL 4.4 02/03/2023   VLDL 37 (H) 10/30/2015    LDLCALC 124 (H) 02/03/2023    Current HIV Regimen: Rukobia + Descovy + Sunlenca  TARGET DATE: The 19th of the month  Assessment: Chaeli presents today for their maintenance Sunlenca injections; today is the last day within her target window. Administered lenacapavir 463.5/1.22mL subcutaneously in upper left abdomen. Administered lenacapavir 463.5/1.43mL subcutaneously in lower left abdomen more than two inches away from first injection site. Monitored patient for 10 minutes after injection. Injections were tolerated well without issue. Will need a follow up appointment for injections in 6 months (~04/15/2024).  She has been taking [place] and Descovy appropriately and filling through [facility] now. HIV RNA remains undetectable; last checked in January.  Will follow up with Dr. Drue Second in July and will defer labs until that visit.    Plan: - Sunlenca maintenance injections administered - Continue Rukobia and Descovy - Follow up with Dr. Drue Second on 02/25/24 - Schedule Sunlenca injections in 6 months (04/04/24 with Cassie) - Contact for any issues or questions  Margarite Gouge, PharmD, CPP, BCIDP, AAHIVP Clinical Pharmacist Practitioner Infectious Diseases Clinical Pharmacist [facility]

## 2023-10-28 ENCOUNTER — Ambulatory Visit (INDEPENDENT_AMBULATORY_CARE_PROVIDER_SITE_OTHER): Admitting: Pharmacist

## 2023-10-28 ENCOUNTER — Other Ambulatory Visit: Payer: Self-pay

## 2023-10-28 DIAGNOSIS — B20 Human immunodeficiency virus [HIV] disease: Secondary | ICD-10-CM

## 2023-10-28 MED ORDER — LENACAPAVIR SODIUM 463.5 MG/1.5ML ~~LOC~~ SOLN
927.0000 mg | Freq: Once | SUBCUTANEOUS | Status: AC
  Administered 2023-10-28: 927 mg via SUBCUTANEOUS

## 2023-10-29 ENCOUNTER — Other Ambulatory Visit: Payer: Self-pay | Admitting: Family Medicine

## 2023-10-29 DIAGNOSIS — I10 Essential (primary) hypertension: Secondary | ICD-10-CM

## 2023-11-04 ENCOUNTER — Encounter: Payer: Self-pay | Admitting: Family Medicine

## 2023-11-04 ENCOUNTER — Encounter: Payer: Self-pay | Admitting: Hematology and Oncology

## 2023-11-04 ENCOUNTER — Ambulatory Visit (INDEPENDENT_AMBULATORY_CARE_PROVIDER_SITE_OTHER): Payer: Medicare Other | Admitting: Family Medicine

## 2023-11-04 VITALS — BP 125/82 | HR 78 | Wt 134.2 lb

## 2023-11-04 DIAGNOSIS — Z853 Personal history of malignant neoplasm of breast: Secondary | ICD-10-CM | POA: Diagnosis not present

## 2023-11-04 DIAGNOSIS — I10 Essential (primary) hypertension: Secondary | ICD-10-CM | POA: Diagnosis not present

## 2023-11-04 DIAGNOSIS — B20 Human immunodeficiency virus [HIV] disease: Secondary | ICD-10-CM | POA: Diagnosis not present

## 2023-11-04 NOTE — Progress Notes (Unsigned)
 Established Patient Office Visit  Subjective    Patient ID: Veronica Robles, female    DOB: 1955-08-01  Age: 68 y.o. MRN: [id number]  CC:  Chief Complaint  Patient presents with   Medical Management of Chronic Issues    Patient states that the side she had breast cancer it feels a little funny     HPI Veronica Robles presents for follow up of hypertension. Patient reports that she has had breast surgery 2/2 cancer and is recovering well.   Outpatient Encounter Medications as of 11/04/2023  Medication Sig   anastrozole (ARIMIDEX) 1 MG tablet Take 1 tablet (1 mg total) by mouth daily.   Blood Pressure Monitoring (ADULT BLOOD PRESSURE CUFF LG) KIT     emtricitabine-tenofovir AF (DESCOVY) 200-25 MG tablet Take 1 tablet by mouth daily.   fostemsavir tromethamine (RUKOBIA) 600 MG TB12 ER tablet Take 1 tablet (600 mg total) by mouth 2 (two) times daily.   hydrochlorothiazide (HYDRODIURIL) 25 MG tablet TAKE 1 TABLET(25 MG) BY MOUTH DAILY   polyethylene glycol powder (MIRALAX) 17 GM/SCOOP powder Take 17 g by mouth daily.   SQ injection lenacapavir (SUNLENCA) 463.5 MG/1.5ML SQ injection Inject 3 mLs (927 mg total) into the skin every 6 (six) months. Administer each injection subcutaneously at separate sites in the abdomen (more or equal to 2 inches from the navel).   No facility-administered encounter medications on file as of 11/04/2023.    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   Family history of breast cancer 03/20/2022   Family history of pancreatic cancer 03/20/2022   HIV infection (HCC) 1993   Hypertension     Past Surgical History:  Procedure Laterality Date   ABDOMINAL HYSTERECTOMY     APPENDECTOMY     BREAST BIOPSY Right 2015   BREAST BIOPSY Left 08/26/2022   [place] LT RADIOACTIVE SEED LOC 08/26/2022 [facility]   BREAST BIOPSY  08/26/2022   MM LT RADIOACTIVE SEED LOC MAMMO GUIDE 08/26/2022 [facility]    BREAST LUMPECTOMY WITH RADIOACTIVE SEED LOCALIZATION Left 08/27/2022   Procedure: LEFT BREAST LUMPECTOMY WITH RADIOACTIVE SEED LOCALIZATION;  Surgeon: Abigail Miyamoto, MD;  Location: [facility];  Service: General;  Laterality: Left;   CYSTOSCOPY/RETROGRADE/URETEROSCOPY Right 11/14/2016   Procedure: CYSTOSCOPY/RETROGRADE/URETEROSCOPY/ STONE EXTRACTION/HOLMIUM LASER/STENT PLACEMENT;  Surgeon: Ihor Gully, MD;  Location: [facility];  Service: Urology;  Laterality: Right;   PORT-A-CATH REMOVAL Right 08/27/2022   Procedure: REMOVAL PORT-A-CATH;  Surgeon: Abigail Miyamoto, MD;  Location: [facility];  Service: General;  Laterality: Right;   PORTACATH PLACEMENT Right 03/26/2022   Procedure: INSERTION PORT-A-CATH;  Surgeon: Abigail Miyamoto, MD;  Location: [facility];  Service: General;  Laterality: Right;   RADIOACTIVE SEED GUIDED AXILLARY SENTINEL LYMPH NODE Left 08/27/2022   Procedure: RADIOACTIVE SEED GUIDED LEFT AXILLARY SENTINEL LYMPH NODE DISSECTION;  Surgeon: Abigail Miyamoto, MD;  Location: [facility];  Service: General;  Laterality: Left;    Family History  Problem Relation Age of Onset   Hypertension Mother    Pancreatic cancer Father 81   Breast cancer Sister        dx 94s   Lung cancer Sister        d. 73    Social History   Socioeconomic History   Marital status: Single    Spouse name: Not on file   Number of children: Not on file   Years  of education: Not on file   Highest education level: Not on file  Occupational History   Not on file  Tobacco Use   Smoking status: Former    Current packs/day: 0.10    Types: Cigarettes   Smokeless tobacco: Never  Vaping Use   Vaping status: Never Used  Substance and Sexual Activity   Alcohol use: No    Alcohol/week: 0.0 standard drinks of alcohol   Drug use: No   Sexual activity: Not Currently    Partners: Male    Birth control/protection: Surgical    Comment: declined  condoms  Other Topics Concern   Not on file  Social History Narrative   Not on file   Social Drivers of Health   Financial Resource Strain: Low Risk  (08/20/2023)   Overall Financial Resource Strain (CARDIA)    Difficulty of Paying Living Expenses: Not hard at all  Food Insecurity: No Food Insecurity (08/20/2023)   Hunger Vital Sign    Worried About Running Out of Food in the Last Year: Never true    Ran Out of Food in the Last Year: Never true  Transportation Needs: No Transportation Needs (08/20/2023)   PRAPARE - Administrator, Civil Service (Medical): No    Lack of Transportation (Non-Medical): No  Physical Activity: Insufficiently Active (08/20/2023)   Exercise Vital Sign    Days of Exercise per Week: 7 days    Minutes of Exercise per Session: 10 min  Stress: No Stress Concern Present (08/20/2023)   [employer] of Occupational Health - Occupational Stress Questionnaire    Feeling of Stress : Not at all  Social Connections: Moderately Isolated (08/20/2023)   Social Connection and Isolation Panel [NHANES]    Frequency of Communication with Friends and Family: More than three times a week    Frequency of Social Gatherings with Friends and Family: More than three times a week    Attends Religious Services: More than 4 times per year    Active Member of [employer] or Organizations: No    Attends Banker Meetings: Never    Marital Status: Never married  Intimate Partner Violence: Not At Risk (08/20/2023)   Humiliation, Afraid, Rape, and Kick questionnaire    Fear of Current or Ex-Partner: No    Emotionally Abused: No    Physically Abused: No    Sexually Abused: No    Review of Systems  All other systems reviewed and are negative.       Objective    BP 125/82 (BP Location: Right Arm, Patient Position: Sitting, Cuff Size: Normal)   Pulse 78   Wt 134 lb 3.2 oz (60.9 kg)   SpO2 98%   BMI 24.55 kg/m   Physical Exam Vitals and nursing note  reviewed.  Constitutional:      General: She is not in acute distress. Cardiovascular:     Rate and Rhythm: Normal rate and regular rhythm.  Pulmonary:     Effort: Pulmonary effort is normal.     Breath sounds: Normal breath sounds.  Musculoskeletal:     Right lower leg: No edema.     Left lower leg: No edema.  Neurological:     General: No focal deficit present.     Mental Status: She is alert and oriented to person, place, and time.        Assessment & Plan:  1. Essential hypertension (Primary) Appears stable.   2. HIV disease (HCC) Management as per consultant  3.  History of breast cancer Management per consultant     Return in about 6 months (around 05/05/2024) for follow up, chronic med issues.   Tommie Raymond, MD

## 2023-11-06 ENCOUNTER — Encounter: Payer: Self-pay | Admitting: Family Medicine

## 2023-11-09 ENCOUNTER — Other Ambulatory Visit: Payer: Self-pay | Admitting: Hematology and Oncology

## 2023-11-10 ENCOUNTER — Other Ambulatory Visit: Payer: Self-pay

## 2023-11-19 ENCOUNTER — Other Ambulatory Visit: Payer: Self-pay

## 2023-11-23 ENCOUNTER — Other Ambulatory Visit: Payer: Self-pay

## 2023-11-23 ENCOUNTER — Other Ambulatory Visit: Payer: Self-pay | Admitting: Pharmacy Technician

## 2023-11-23 NOTE — Progress Notes (Signed)
 Specialty Pharmacy Refill Coordination Note  Veronica Robles is a 68 y.o. female contacted today regarding refills of specialty medication(s) Fostemsavir Tromethamine  (Rukobia ); Emtricitabine -Tenofovir  AF (Descovy )   Patient requested Pickup at [facility] at [place] date: 11/27/23   Medication will be filled on 11/26/23.

## 2023-11-26 ENCOUNTER — Other Ambulatory Visit: Payer: Self-pay

## 2023-12-07 ENCOUNTER — Ambulatory Visit: Payer: Medicare Other | Attending: Surgery

## 2023-12-08 ENCOUNTER — Other Ambulatory Visit (HOSPITAL_COMMUNITY): Payer: Self-pay

## 2023-12-09 NOTE — Progress Notes (Signed)
 The 10-year ASCVD risk score (Arnett DK, et al., 2019) is: 10.7%   Values used to calculate the score:     Age: 68 years     Sex: Female     Is Non-Hispanic African American: Yes     Diabetic: No     Tobacco smoker: No     Systolic Blood Pressure: 125 mmHg     Is BP treated: Yes     HDL Cholesterol: 48 mg/dL     Total Cholesterol: 209 mg/dL  Arlon Bergamo, BSN, RN

## 2023-12-16 ENCOUNTER — Other Ambulatory Visit (HOSPITAL_COMMUNITY): Payer: Self-pay

## 2023-12-18 ENCOUNTER — Other Ambulatory Visit: Payer: Self-pay

## 2023-12-18 NOTE — Progress Notes (Signed)
 Specialty Pharmacy Refill Coordination Note  Veronica Robles is a 68 y.o. female contacted today regarding refills of specialty medication(s) Emtricitabine -Tenofovir  AF (Descovy ); Fostemsavir Tromethamine  (Rukobia )   Patient requested Pickup at [facility] at [place] date: 12/28/23   Medication will be filled on 05.30.25.

## 2023-12-25 ENCOUNTER — Other Ambulatory Visit: Payer: Self-pay

## 2024-01-18 ENCOUNTER — Ambulatory Visit: Attending: Surgery

## 2024-01-18 VITALS — Wt 137.1 lb

## 2024-01-18 DIAGNOSIS — Z483 Aftercare following surgery for neoplasm: Secondary | ICD-10-CM | POA: Insufficient documentation

## 2024-01-18 NOTE — Therapy (Signed)
 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   Family history of breast cancer 03/20/2022   Family history of pancreatic cancer 03/20/2022   HIV infection (HCC) 1993   Hypertension    Past Surgical History:  Procedure Laterality Date   ABDOMINAL HYSTERECTOMY     APPENDECTOMY     BREAST BIOPSY Right 2015   BREAST BIOPSY Left 08/26/2022   US  LT RADIOACTIVE SEED LOC 08/26/2022 [facility]   BREAST BIOPSY  08/26/2022   MM LT RADIOACTIVE SEED LOC MAMMO GUIDE 08/26/2022 [facility]   BREAST LUMPECTOMY WITH RADIOACTIVE SEED LOCALIZATION Left 08/27/2022   Procedure: LEFT BREAST LUMPECTOMY WITH RADIOACTIVE SEED LOCALIZATION;  Surgeon: Vernetta Berg, MD;  Location: [facility];  Service: General;  Laterality: Left;   CYSTOSCOPY/RETROGRADE/URETEROSCOPY Right 11/14/2016   Procedure: CYSTOSCOPY/RETROGRADE/URETEROSCOPY/ STONE EXTRACTION/HOLMIUM LASER/STENT PLACEMENT;  Surgeon: Mark Ottelin, MD;  [facility];  Service: Urology;  Laterality: Right;   PORT-A-CATH REMOVAL Right 08/27/2022   Procedure: REMOVAL PORT-A-CATH;  Surgeon: Vernetta Berg, MD;  Location: [facility];  Service: General;  Laterality: Right;   PORTACATH PLACEMENT Right 03/26/2022   Procedure: INSERTION PORT-A-CATH;  Surgeon: Vernetta Berg, MD;  Location: [facility];  Service: General;  Laterality:  Right;   RADIOACTIVE SEED GUIDED AXILLARY SENTINEL LYMPH NODE Left 08/27/2022   Procedure: RADIOACTIVE SEED GUIDED LEFT AXILLARY SENTINEL LYMPH NODE DISSECTION;  Surgeon: Vernetta Berg, MD;  Location: [facility];  Service: General;  Laterality: Left;   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  3 of 4 lymph nodes positive. Neoadjuvant chemotherapy completed and will be having radiation. It is ER positive, PR negative, and HER2 negative with a Ki67 of 10%. She has a positive axillary node. She also has HIV disease which she has not shared with her family.   PRECAUTIONS: left UE Lymphedema risk, None  SUBJECTIVE: Pt returns for her 3 month L-Dex screen.   PAIN:  Are you having pain? No  SOZO SCREENING: Patient  was assessed today using the SOZO machine to determine the lymphedema index score. This was compared to her baseline score. It was determined that she is within the recommended range when compared to her baseline and no further action is needed at this time. She will continue SOZO screenings. These are done every  3 months for 2 years post operatively followed by every 6 months for 2 years, and then annually.   L-DEX FLOWSHEETS - 01/18/24 1600       L-DEX LYMPHEDEMA SCREENING   Measurement Type Unilateral    L-DEX MEASUREMENT EXTREMITY Upper Extremity    POSITION  Standing    DOMINANT SIDE Right    At Risk Side Left    BASELINE SCORE (UNILATERAL) 0.2    L-DEX SCORE (UNILATERAL) 1    VALUE CHANGE (UNILAT) 0.8            Aden Berwyn Caldron, PTA 01/18/2024, 4:39 PM

## 2024-01-21 ENCOUNTER — Other Ambulatory Visit: Payer: Self-pay | Admitting: Pharmacy Technician

## 2024-01-21 ENCOUNTER — Other Ambulatory Visit: Payer: Self-pay

## 2024-01-21 ENCOUNTER — Other Ambulatory Visit (HOSPITAL_COMMUNITY): Payer: Self-pay

## 2024-01-21 NOTE — Progress Notes (Signed)
 Specialty Pharmacy Refill Coordination Note  Veronica Robles is a 68 y.o. female contacted today regarding refills of specialty medication(s) Emtricitabine -Tenofovir  AF (Descovy ); Fostemsavir Tromethamine  (Rukobia )   Patient requested Pickup at [facility] at [place] date: 01/27/24   Medication will be filled on 01/26/24.

## 2024-02-02 ENCOUNTER — Encounter: Payer: Self-pay | Admitting: Hematology and Oncology

## 2024-02-05 ENCOUNTER — Other Ambulatory Visit: Payer: Self-pay

## 2024-02-05 DIAGNOSIS — B20 Human immunodeficiency virus [HIV] disease: Secondary | ICD-10-CM

## 2024-02-05 DIAGNOSIS — Z79899 Other long term (current) drug therapy: Secondary | ICD-10-CM

## 2024-02-11 ENCOUNTER — Other Ambulatory Visit: Payer: Medicare Other

## 2024-02-11 ENCOUNTER — Other Ambulatory Visit: Payer: Self-pay

## 2024-02-11 DIAGNOSIS — Z79899 Other long term (current) drug therapy: Secondary | ICD-10-CM

## 2024-02-11 DIAGNOSIS — B20 Human immunodeficiency virus [HIV] disease: Secondary | ICD-10-CM

## 2024-02-12 LAB — T-HELPER CELL (CD4) - (RCID CLINIC ONLY)
CD4 % Helper T Cell: 18 % — ABNORMAL LOW (ref 33–65)
CD4 T Cell Abs: 299 /uL — ABNORMAL LOW (ref 400–1790)

## 2024-02-14 ENCOUNTER — Encounter: Payer: Self-pay | Admitting: Hematology and Oncology

## 2024-02-14 LAB — COMPLETE METABOLIC PANEL WITHOUT GFR
AG Ratio: 1.8 (calc) (ref 1.0–2.5)
ALT: 15 U/L (ref 6–29)
AST: 21 U/L (ref 10–35)
Albumin: 4.2 g/dL (ref 3.6–5.1)
Alkaline phosphatase (APISO): 88 U/L (ref 37–153)
BUN: 10 mg/dL (ref 7–25)
CO2: 28 mmol/L (ref 20–32)
Calcium: 9.4 mg/dL (ref 8.6–10.4)
Chloride: 102 mmol/L (ref 98–110)
Creat: 0.91 mg/dL (ref 0.50–1.05)
Globulin: 2.3 g/dL (ref 1.9–3.7)
Glucose, Bld: 124 mg/dL — ABNORMAL HIGH (ref 65–99)
Potassium: 3.7 mmol/L (ref 3.5–5.3)
Sodium: 140 mmol/L (ref 135–146)
Total Bilirubin: 0.3 mg/dL (ref 0.2–1.2)
Total Protein: 6.5 g/dL (ref 6.1–8.1)

## 2024-02-14 LAB — CBC WITH DIFFERENTIAL/PLATELET
Absolute Lymphocytes: 1814 {cells}/uL (ref 850–3900)
Absolute Monocytes: 491 {cells}/uL (ref 200–950)
Basophils Absolute: 11 {cells}/uL (ref 0–200)
Basophils Relative: 0.2 %
Eosinophils Absolute: 59 {cells}/uL (ref 15–500)
Eosinophils Relative: 1.1 %
HCT: 38.1 % (ref 35.0–45.0)
Hemoglobin: 12.1 g/dL (ref 11.7–15.5)
MCH: 27.9 pg (ref 27.0–33.0)
MCHC: 31.8 g/dL — ABNORMAL LOW (ref 32.0–36.0)
MCV: 87.8 fL (ref 80.0–100.0)
MPV: 9.9 fL (ref 7.5–12.5)
Monocytes Relative: 9.1 %
Neutro Abs: 3024 {cells}/uL (ref 1500–7800)
Neutrophils Relative %: 56 %
Platelets: 163 Thousand/uL (ref 140–400)
RBC: 4.34 Million/uL (ref 3.80–5.10)
RDW: 13.4 % (ref 11.0–15.0)
Total Lymphocyte: 33.6 %
WBC: 5.4 Thousand/uL (ref 3.8–10.8)

## 2024-02-14 LAB — LIPID PANEL
Cholesterol: 188 mg/dL (ref <200)
HDL: 42 mg/dL — ABNORMAL LOW (ref >=50)
LDL Cholesterol (Calc): 118 mg/dL — ABNORMAL HIGH
Non-HDL Cholesterol (Calc): 146 mg/dL — ABNORMAL HIGH (ref <130)
Total CHOL/HDL Ratio: 4.5 (calc) (ref <5.0)
Triglycerides: 165 mg/dL — ABNORMAL HIGH (ref <150)

## 2024-02-14 LAB — HIV-1 RNA QUANT-NO REFLEX-BLD
HIV 1 RNA Quant: NOT DETECTED {copies}/mL
HIV-1 RNA Quant, Log: NOT DETECTED {Log_copies}/mL

## 2024-02-19 ENCOUNTER — Other Ambulatory Visit: Payer: Self-pay

## 2024-02-19 ENCOUNTER — Other Ambulatory Visit: Payer: Self-pay | Admitting: Internal Medicine

## 2024-02-19 ENCOUNTER — Other Ambulatory Visit: Payer: Self-pay | Admitting: Pharmacy Technician

## 2024-02-19 DIAGNOSIS — B20 Human immunodeficiency virus [HIV] disease: Secondary | ICD-10-CM

## 2024-02-19 NOTE — Progress Notes (Signed)
 Specialty Pharmacy Refill Coordination Note  Veronica Robles is a 68 y.o. female contacted today regarding refills of specialty medication(s) Fostemsavir Tromethamine  (Rukobia ); Emtricitabine -Tenofovir  AF (Descovy )   Patient requested Pickup at [facility] at [place] date: 03/01/24   Medication will be filled on 03/01/24.   PPU 8/5, RR, pt already had appt for lab on 7/17

## 2024-02-22 ENCOUNTER — Other Ambulatory Visit (HOSPITAL_COMMUNITY): Payer: Self-pay

## 2024-02-22 ENCOUNTER — Other Ambulatory Visit: Payer: Self-pay

## 2024-02-22 MED ORDER — RUKOBIA 600 MG PO TB12
600.0000 mg | ORAL_TABLET | Freq: Two times a day (BID) | ORAL | 5 refills | Status: DC
  Filled 2024-02-22: qty 60, 30d supply, fill #0

## 2024-02-22 MED ORDER — DESCOVY 200-25 MG PO TABS
1.0000 | ORAL_TABLET | Freq: Every day | ORAL | 5 refills | Status: DC
  Filled 2024-02-22: qty 30, 30d supply, fill #0

## 2024-02-23 ENCOUNTER — Ambulatory Visit: Payer: Medicare Other | Admitting: Adult Health

## 2024-02-24 ENCOUNTER — Other Ambulatory Visit: Payer: Self-pay

## 2024-02-25 ENCOUNTER — Other Ambulatory Visit: Payer: Self-pay

## 2024-02-25 ENCOUNTER — Encounter: Payer: Self-pay | Admitting: Internal Medicine

## 2024-02-25 ENCOUNTER — Ambulatory Visit: Payer: Self-pay | Admitting: Internal Medicine

## 2024-02-25 ENCOUNTER — Ambulatory Visit

## 2024-02-25 VITALS — BP 133/78 | HR 79 | Temp 98.7°F | Ht 62.0 in | Wt 135.0 lb

## 2024-02-25 DIAGNOSIS — B20 Human immunodeficiency virus [HIV] disease: Secondary | ICD-10-CM | POA: Diagnosis not present

## 2024-02-25 DIAGNOSIS — Z79899 Other long term (current) drug therapy: Secondary | ICD-10-CM | POA: Diagnosis not present

## 2024-02-25 DIAGNOSIS — Z789 Other specified health status: Secondary | ICD-10-CM

## 2024-02-25 MED ORDER — RUKOBIA 600 MG PO TB12
600.0000 mg | ORAL_TABLET | Freq: Two times a day (BID) | ORAL | 5 refills | Status: DC
  Filled 2024-02-25: qty 60, 30d supply, fill #0
  Filled 2024-03-23: qty 60, 30d supply, fill #1
  Filled 2024-04-20: qty 60, 30d supply, fill #2
  Filled 2024-05-13 – 2024-05-23 (×2): qty 60, 30d supply, fill #3

## 2024-02-25 MED ORDER — DESCOVY 200-25 MG PO TABS
1.0000 | ORAL_TABLET | Freq: Every day | ORAL | 5 refills | Status: DC
  Filled 2024-02-25: qty 30, 30d supply, fill #0
  Filled 2024-03-23: qty 30, 30d supply, fill #1
  Filled 2024-04-20: qty 30, 30d supply, fill #2
  Filled 2024-05-13 – 2024-05-23 (×2): qty 30, 30d supply, fill #3

## 2024-02-25 NOTE — Progress Notes (Signed)
 RFV: follow up for hiv disease  Patient ID: Veronica Robles, female   DOB: 07-09-1956, 68 y.o.   MRN: [id number]  HPI Veronica Robles is a 68yo F with HIV disease, CD 4 count 288/VL <20 on descovy , rukobia  BID, and sunlenca . She reports that she is still recovering from personal losses with  Sister died -- and her son had CABG.   She has noticed left 2nd digit swelling. Without trauma. 3 weeks ago. No change of decreasing back to normal. Doesn't recall any injury. Soc hx : grandson 32 yo and female 16yo.    Outpatient Encounter Medications as of 02/25/2024  Medication Sig   anastrozole  (ARIMIDEX ) 1 MG tablet TAKE 1 TABLET(1 MG) BY MOUTH DAILY   Blood Pressure Monitoring (ADULT BLOOD PRESSURE CUFF LG) KIT     emtricitabine -tenofovir  AF (DESCOVY ) 200-25 MG tablet Take 1 tablet by mouth daily.   fostemsavir tromethamine  (RUKOBIA ) 600 MG TB12 ER tablet Take 1 tablet (600 mg total) by mouth 2 (two) times daily.   hydrochlorothiazide  (HYDRODIURIL ) 25 MG tablet TAKE 1 TABLET(25 MG) BY MOUTH DAILY   polyethylene glycol powder (MIRALAX ) 17 GM/SCOOP powder Take 17 g by mouth daily.   SQ injection lenacapavir (SUNLENCA ) 463.5 MG/1.5ML SQ injection Inject 3 mLs (927 mg total) into the skin every 6 (six) months. Administer each injection subcutaneously at separate sites in the abdomen (more or equal to 2 inches from the navel).   No facility-administered encounter medications on file as of 02/25/2024.     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 Except for hotfalshes -- she thinks for medications HENT: Negative for congestion, sore throat, rhinorrhea, sneezing, trouble swallowing and sinus pressure.  Eyes: Negative for photophobia and visual disturbance.  Respiratory: Negative for cough, chest tightness, shortness of breath, wheezing and stridor.  Cardiovascular: Negative for chest pain, palpitations and leg swelling.  Gastrointestinal: Negative for nausea, vomiting, abdominal pain, diarrhea, constipation, blood in stool, abdominal distention and anal bleeding.  Genitourinary: Negative for dysuria, hematuria, flank pain and difficulty urinating.  Musculoskeletal: Negative for myalgias, back pain, joint swelling, arthralgias and gait problem.  Skin: Negative for color change, pallor, rash and wound.  Neurological: Negative for dizziness, tremors, weakness and light-headedness.  Hematological: Negative for adenopathy. Does not bruise/bleed easily.  Psychiatric/Behavioral: Negative for behavioral problems, confusion, sleep disturbance, dysphoric mood, decreased concentration and agitation.   Physical Exam   Ht 5' 2 (1.575 m)   Wt 135 lb (61.2 kg)   BMI 24.69 kg/m   Physical Exam  Constitutional:  oriented  to person, place, and time. appears well-developed and well-nourished. No distress.  HENT: [place]/AT, PERRLA, no scleral icterus Mouth/Throat: Oropharynx is clear and moist. No oropharyngeal  exudate.  Cardiovascular: Normal rate, regular rhythm and normal heart sounds. Exam reveals no gallop and no friction rub.  No murmur heard.  Pulmonary/Chest: Effort normal and breath sounds normal. No respiratory distress.  has no wheezes.  Neck = supple, no nuchal rigidity Abdominal: Soft. Bowel sounds are normal.  exhibits no distension. There is no tenderness.  Lymphadenopathy: no cervical adenopathy. No axillary adenopathy Neurological: alert and oriented to person, place, and time.  Skin: Skin is warm and dry. No rash noted. No erythema.  Psychiatric: a normal mood and affect.  behavior is normal.   Lab Results  Component Value Date   CD4TCELL 18 (L) 02/11/2024   Lab Results  Component Value Date   CD4TABS 299 (L) 02/11/2024   CD4TABS 277 (L) 08/27/2023   CD4TABS 218 (L) 02/03/2023   Lab Results  Component Value Date   HIV1RNAQUANT NOT DETECTED 02/11/2024   Lab Results  Component Value Date   HEPBSAB INDETER (A) 08/08/2009   Lab Results  Component Value Date   LABRPR NON-REACTIVE 08/27/2023    CBC Lab Results  Component Value Date   WBC 5.4 02/11/2024   RBC 4.34 02/11/2024   HGB 12.1 02/11/2024   HCT 38.1 02/11/2024   PLT 163 02/11/2024   MCV 87.8 02/11/2024   MCH 27.9 02/11/2024   MCHC 31.8 (L) 02/11/2024   RDW 13.4 02/11/2024   LYMPHSABS 1,258 02/03/2023   MONOABS 0.4 08/08/2022   EOSABS 59 02/11/2024    BMET Lab Results  Component Value Date   NA 140 02/11/2024   K 3.7 02/11/2024   CL 102 02/11/2024   CO2 28 02/11/2024   GLUCOSE 124 (H) 02/11/2024   BUN 10 02/11/2024   CREATININE 0.91 02/11/2024   CALCIUM 9.4 02/11/2024   GFRNONAA >60 08/08/2022   GFRAA 73 11/01/2020      Assessment and Plan  Hiv disease= continue with the regimen. Determined next sunlenca  injection; continue with descovy  and rukobia ; will give Refills; reviewed lab results with the patient --encouraged her great adherence  Long term medication management = cr is  stable  Medication adherence counseling = spent 10 minutes discussing importance of med adherence

## 2024-02-26 ENCOUNTER — Other Ambulatory Visit: Payer: Self-pay

## 2024-03-23 ENCOUNTER — Other Ambulatory Visit (HOSPITAL_COMMUNITY): Payer: Self-pay

## 2024-03-23 ENCOUNTER — Other Ambulatory Visit: Payer: Self-pay

## 2024-03-23 NOTE — Progress Notes (Signed)
 Specialty Pharmacy Refill Coordination Note  Spoke with Veronica Robles is a 68 y.o. female contacted today regarding refills of specialty medication(s) Emtricitabine -Tenofovir  AF (Descovy ); Fostemsavir Tromethamine  (Rukobia )  Doses on hand: 11 days   Patient requested: Pickup at [facility] at [place] date: 03/25/24  Medication will be filled on 03/24/24.

## 2024-03-24 ENCOUNTER — Other Ambulatory Visit: Payer: Self-pay

## 2024-03-24 ENCOUNTER — Other Ambulatory Visit (HOSPITAL_COMMUNITY): Payer: Self-pay

## 2024-03-24 NOTE — Progress Notes (Signed)
 Specialty Pharmacy Refill Coordination Note  Veronica Robles is a 68 y.o. female assessed today regarding refills of clinic administered specialty medication(s) Lenacapavir Sodium  (Sunlenca )   Clinic requested Courier to Provider Office   Delivery date: 03/29/24   Verified address: [address] [place] [address]   Medication will be filled on 03/25/24.

## 2024-03-25 ENCOUNTER — Other Ambulatory Visit (HOSPITAL_COMMUNITY): Payer: Self-pay

## 2024-03-25 ENCOUNTER — Other Ambulatory Visit: Payer: Self-pay

## 2024-03-29 ENCOUNTER — Telehealth: Payer: Self-pay

## 2024-03-29 NOTE — Telephone Encounter (Signed)
 [facility] Patient Advocate Encounter  Patient's medications SUNLENCA  have been couriered to [facility] from [facility] and will be administered at the patients appointment on 04/05/24.  Charmaine Sharps, CPhT Specialty Pharmacy Patient [facility] for Infectious Disease Phone: [phone] Fax:  [phone]

## 2024-04-04 ENCOUNTER — Ambulatory Visit: Admitting: Pharmacist

## 2024-04-04 NOTE — Progress Notes (Incomplete)
 HPI: Veronica Robles is a 68 y.o. female who presents to the [facility] pharmacy clinic for Sunlenca  administration.  Patient Active Problem List   Diagnosis Date Noted  . Port-A-Cath in place 08/08/2022  . Family history of breast cancer 03/20/2022  . Family history of pancreatic cancer 03/20/2022  . Gastroesophageal reflux disease without esophagitis 03/12/2022  . Weight loss 03/12/2022  . Malignant neoplasm of upper-outer quadrant of left breast in female, estrogen receptor positive (HCC) 03/07/2022  . Dental caries 12/01/2021  . Other viral warts 12/01/2021  . Healthcare maintenance 11/09/2018  . 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 HYPERTENSION, BENIGN 01/26/2007  . 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 HIV disease (HCC) 10/26/2006  . HX, PERSONAL, PENICILLIN ALLERGY 10/26/2006  . HX, PERSONAL, DRUG ALLERGY NOS 10/26/2006    Patient's Medications  New Prescriptions   No medications on file  Previous Medications   ANASTROZOLE  (ARIMIDEX ) 1 MG TABLET    TAKE 1 TABLET(1 MG) BY MOUTH DAILY   BLOOD PRESSURE MONITORING (ADULT BLOOD PRESSURE CUFF LG) KIT        EMTRICITABINE -TENOFOVIR  AF (DESCOVY ) 200-25 MG TABLET    Take 1 tablet by mouth daily.   FOSTEMSAVIR TROMETHAMINE  (RUKOBIA ) 600 MG TB12 ER TABLET    Take 1 tablet (600 mg total) by mouth 2 (two) times daily.   HYDROCHLOROTHIAZIDE  (HYDRODIURIL ) 25 MG TABLET    TAKE 1 TABLET(25 MG) BY MOUTH DAILY   POLYETHYLENE GLYCOL POWDER (MIRALAX ) 17 GM/SCOOP POWDER    Take 17 g by mouth daily.   SQ INJECTION LENACAPAVIR (SUNLENCA ) 463.5 MG/1.5ML SQ INJECTION    Inject 3 mLs (927 mg total) into the skin every 6 (six) months. Administer each injection subcutaneously at separate sites in the abdomen (more or equal to 2 inches from the navel).  Modified Medications   No medications on file  Discontinued Medications   No medications on file    Labs: Lab Results  Component Value Date   HIV1RNAQUANT NOT DETECTED  02/11/2024   HIV1RNAQUANT 28 (H) 08/27/2023   HIV1RNAQUANT Not Detected 02/03/2023   CD4TABS 299 (L) 02/11/2024   CD4TABS 277 (L) 08/27/2023   CD4TABS 218 (L) 02/03/2023    RPR and STI Lab Results  Component Value Date   LABRPR NON-REACTIVE 08/27/2023   LABRPR NON-REACTIVE 02/03/2023   LABRPR NON-REACTIVE 05/01/2020   LABRPR NON-REACTIVE 05/10/2018   LABRPR NON REAC 10/30/2015    STI Results GC CT  02/03/2023  1:54 PM Negative  Negative   05/01/2020  2:25 PM Negative  Negative   03/04/2016 12:00 AM Negative  Negative   12/05/2013 12:00 AM  CT: Negative     Hepatitis B Lab Results  Component Value Date   HEPBSAB INDETER (A) 08/08/2009   HEPBSAG NEG 08/08/2009   HEPBCAB POS (A) 08/08/2009   Hepatitis C No results found for: HEPCAB, HCVRNAPCRQN Hepatitis A Lab Results  Component Value Date   HAV POS (A) 08/08/2009   Lipids: Lab Results  Component Value Date   CHOL 188 02/11/2024   TRIG 165 (H) 02/11/2024   HDL 42 (L) 02/11/2024   CHOLHDL 4.5 02/11/2024   VLDL 37 (H) 10/30/2015   LDLCALC 118 (H) 02/11/2024    Current HIV Regimen: Sunlenca  + Rukobia  + Descovy    TARGET DATE: The 19th of the month   Assessment: Veronica Robles presents today for her maintenance Sunlenca  injections. Administered lenacapavir Administered lenacapavir 463.5/1.83mL subcutaneously in upper left abdomen. Administered lenacapavir 463.5/1.39mL subcutaneously in lower left abdomen more  than two inches away from first injection site.   She has been taking Rukobia  and Descovy  appropriately. Last HIV RNA was undetectable (02/11/24).   Plan: ***   Cassie L. Kuppelweiser, PharmD, BCIDP, AAHIVP, CPP Clinical Pharmacist Practitioner - Infectious Diseases Clinical Pharmacist Lead - Specialty Pharmacy [facility] for Infectious Disease

## 2024-04-04 NOTE — Progress Notes (Signed)
 HPI: Veronica Robles is a 68 y.o. female who presents to the [facility] pharmacy clinic for Sunlenca  administration.  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   FOSTEMSAVIR TROMETHAMINE  (RUKOBIA ) 600 MG TB12 ER TABLET    Take 1 tablet (600 mg total) by mouth 2 (two) times daily.   HYDROCHLOROTHIAZIDE  (HYDRODIURIL ) 25 MG TABLET    TAKE 1 TABLET(25 MG) BY MOUTH DAILY   POLYETHYLENE GLYCOL POWDER (MIRALAX ) 17 GM/SCOOP POWDER    Take 17 g by mouth daily.   SQ INJECTION LENACAPAVIR (SUNLENCA ) 463.5 MG/1.5ML SQ INJECTION    Inject 3 mLs (927 mg total) into the skin every 6 (six) months. Administer each injection subcutaneously at separate sites in the abdomen (more or equal to 2 inches from the navel).  Modified Medications   No medications on file  Discontinued Medications   No medications on file    Labs: Lab Results  Component Value Date   HIV1RNAQUANT NOT DETECTED 02/11/2024    HIV1RNAQUANT 28 (H) 08/27/2023   HIV1RNAQUANT Not Detected 02/03/2023   CD4TABS 299 (L) 02/11/2024   CD4TABS 277 (L) 08/27/2023   CD4TABS 218 (L) 02/03/2023    RPR and STI Lab Results  Component Value Date   LABRPR NON-REACTIVE 08/27/2023   LABRPR NON-REACTIVE 02/03/2023   LABRPR NON-REACTIVE 05/01/2020   LABRPR NON-REACTIVE 05/10/2018   LABRPR NON REAC 10/30/2015    STI Results GC CT  02/03/2023  1:54 PM Negative  Negative   05/01/2020  2:25 PM Negative  Negative   03/04/2016 12:00 AM Negative  Negative   12/05/2013 12:00 AM  CT: Negative     Hepatitis B Lab Results  Component Value Date   HEPBSAB INDETER (A) 08/08/2009   HEPBSAG NEG 08/08/2009   HEPBCAB POS (A) 08/08/2009   Hepatitis C No results found for: HEPCAB, HCVRNAPCRQN Hepatitis A Lab Results  Component Value Date   HAV POS (A) 08/08/2009   Lipids: Lab Results  Component Value Date   CHOL 188 02/11/2024   TRIG 165 (H) 02/11/2024   HDL 42 (L) 02/11/2024   CHOLHDL 4.5 02/11/2024   VLDL 37 (H) 10/30/2015   LDLCALC 118 (H) 02/11/2024    Current HIV Regimen: Sunlenca  + Rukobia  + Descovy    TARGET DATE: The 19th of the month   Assessment: Laylynn presents today for her maintenance Sunlenca  injections. Administered lenacapavir 463.5/1.21mL subcutaneously in upper right abdomen. Administered lenacapavir 463.5/1.70mL subcutaneously in lower right abdomen more than two  inches away from first injection site. She reports no significant issues with previous injections, but does note a small nodule that is still present.   She has been taking Rukobia  and Descovy  appropriately and reports no issues with obtaining medications from [facility]. Last HIV RNA was undetectable (02/11/24).   Plan: - Sunlenca  maintenance injections administered  - Continue Rukobia  and Descovy   - Next provider visit with Dr. Luiz on 05/26/24 - No labs needed today, will defer next labs to Dr. Luiz - Scheduled Sunlenca   injections appointment in 6 months on 10/04/24 with Cassie  Feliciano Close, PharmD PGY2 Infectious Diseases Pharmacy Resident

## 2024-04-05 ENCOUNTER — Other Ambulatory Visit: Payer: Self-pay

## 2024-04-05 ENCOUNTER — Other Ambulatory Visit: Payer: Self-pay | Admitting: Pharmacist

## 2024-04-05 ENCOUNTER — Ambulatory Visit (INDEPENDENT_AMBULATORY_CARE_PROVIDER_SITE_OTHER): Admitting: Pharmacist

## 2024-04-05 DIAGNOSIS — B20 Human immunodeficiency virus [HIV] disease: Secondary | ICD-10-CM

## 2024-04-05 MED ORDER — LENACAPAVIR SODIUM 463.5 MG/1.5ML ~~LOC~~ SOLN
927.0000 mg | Freq: Once | SUBCUTANEOUS | Status: AC
  Administered 2024-04-05: 927 mg via SUBCUTANEOUS

## 2024-04-05 NOTE — Progress Notes (Signed)
 Specialty Pharmacy Ongoing Clinical Assessment Note  KURSTYN LARIOS is a 68 y.o. female who is being followed by the specialty pharmacy service for RxSp HIV   Patient's specialty medication(s) reviewed today: Emtricitabine -Tenofovir  AF (Descovy ); Fostemsavir Tromethamine  (Rukobia ); Lenacapavir Sodium  (Sunlenca )   Missed doses in the last 4 weeks: 0   Patient/Caregiver did not have any additional questions or concerns.   Therapeutic benefit summary: Patient is achieving benefit   Adverse events/side effects summary: No adverse events/side effects   Patient's therapy is appropriate to: Continue    Goals Addressed             This Visit's Progress    Achieve Undetectable HIV Viral Load < 20   Improving    Patient is not on track and no change. Patient will maintain adherence.  Patient's last viral load was 28 copies/mL on 08/27/23, was previously undetectable.      Improve or maintain quality of life   Improving    Patient is on track. Patient will be monitored by provider to determine if a change in treatment plan is warranted.      Minimize and address adverse drug events/drug interactions   Improving    Patient is on track. Patient will be monitored by provider to determine if a change in treatment plan is warranted         Follow up: 1 year  Zamara is doing so well on her treatment regiment. She is undetectable and has no issues or concerns today.  Brylei Pedley L. Genaro Bekker, PharmD, BCIDP, AAHIVP, CPP Infectious Diseases Clinical Pharmacist Practitioner Clinical Pharmacist Lead, Specialty Pharmacy [facility]

## 2024-04-06 ENCOUNTER — Other Ambulatory Visit: Payer: Self-pay

## 2024-04-20 ENCOUNTER — Other Ambulatory Visit: Payer: Self-pay | Admitting: Pharmacy Technician

## 2024-04-20 ENCOUNTER — Other Ambulatory Visit: Payer: Self-pay

## 2024-04-20 NOTE — Progress Notes (Signed)
 Specialty Pharmacy Refill Coordination Note  Veronica Robles is a 68 y.o. female contacted today regarding refills of specialty medication(s) Emtricitabine -Tenofovir  AF (Descovy ); Fostemsavir Tromethamine  (Rukobia )   Patient requested Pickup at [facility] at [place] date: 04/22/24   Medication will be filled on 04/22/24.

## 2024-04-22 ENCOUNTER — Encounter: Payer: Self-pay | Admitting: Hematology and Oncology

## 2024-04-22 ENCOUNTER — Other Ambulatory Visit: Payer: Self-pay

## 2024-04-28 ENCOUNTER — Other Ambulatory Visit: Payer: Self-pay | Admitting: Family Medicine

## 2024-04-28 DIAGNOSIS — I10 Essential (primary) hypertension: Secondary | ICD-10-CM

## 2024-05-05 ENCOUNTER — Encounter: Payer: Self-pay | Admitting: Family Medicine

## 2024-05-05 ENCOUNTER — Ambulatory Visit (INDEPENDENT_AMBULATORY_CARE_PROVIDER_SITE_OTHER): Admitting: Family Medicine

## 2024-05-05 VITALS — BP 134/76 | HR 66 | Ht 62.0 in | Wt 136.8 lb

## 2024-05-05 DIAGNOSIS — I10 Essential (primary) hypertension: Secondary | ICD-10-CM | POA: Diagnosis not present

## 2024-05-05 MED ORDER — HYDROCHLOROTHIAZIDE 25 MG PO TABS: 25.0000 mg | ORAL_TABLET | Freq: Every day | ORAL | 1 refills | Status: AC

## 2024-05-05 NOTE — Progress Notes (Signed)
 Established Patient Office Visit  Subjective    Patient ID: Veronica Robles, female    DOB: Dec 23, 1955  Age: 68 y.o. MRN: [id number]  CC:  Chief Complaint  Patient presents with   Medical Management of Chronic Issues    6 month follow up.  Pt would like cologaurd  Pt also reports swollen finger she is unsure the cause     HPI [name] presents for follow up of hypertension. She reports med compliance and denies acute complaints.   Outpatient Encounter Medications as of 05/05/2024  Medication Sig   anastrozole  (ARIMIDEX ) 1 MG tablet TAKE 1 TABLET(1 MG) BY MOUTH DAILY   Blood Pressure Monitoring (ADULT BLOOD PRESSURE CUFF LG) KIT     emtricitabine -tenofovir  AF (DESCOVY ) 200-25 MG tablet Take 1 tablet by mouth daily.   fostemsavir tromethamine  (RUKOBIA ) 600 MG TB12 ER tablet Take 1 tablet (600 mg total) by mouth 2 (two) times daily.   polyethylene glycol powder (MIRALAX ) 17 GM/SCOOP powder Take 17 g by mouth daily.   SQ injection lenacapavir (SUNLENCA ) 463.5 MG/1.5ML SQ injection Inject 3 mLs (927 mg total) into the skin every 6 (six) months. Administer each injection subcutaneously at separate sites in the abdomen (more or equal to 2 inches from the navel).   [DISCONTINUED] hydrochlorothiazide  (HYDRODIURIL ) 25 MG tablet TAKE 1 TABLET(25 MG) BY MOUTH DAILY   hydrochlorothiazide  (HYDRODIURIL ) 25 MG tablet Take 1 tablet (25 mg total) by mouth daily.   No facility-administered encounter medications on file as of 05/05/2024.    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   Family history of breast cancer 03/20/2022   Family history of pancreatic cancer 03/20/2022   HIV infection (HCC) 1993   Hypertension     Past Surgical History:  Procedure Laterality Date   ABDOMINAL HYSTERECTOMY     APPENDECTOMY     BREAST BIOPSY Right 2015   BREAST BIOPSY Left 08/26/2022   US  LT RADIOACTIVE SEED LOC 08/26/2022 [facility]    BREAST BIOPSY  08/26/2022   MM LT RADIOACTIVE SEED LOC MAMMO GUIDE 08/26/2022 [facility]   BREAST LUMPECTOMY WITH RADIOACTIVE SEED LOCALIZATION Left 08/27/2022   Procedure: LEFT BREAST LUMPECTOMY WITH RADIOACTIVE SEED LOCALIZATION;  Surgeon: Vernetta Berg, MD;  Location: [facility];  Service: General;  Laterality: Left;   CYSTOSCOPY/RETROGRADE/URETEROSCOPY Right 11/14/2016   Procedure: CYSTOSCOPY/RETROGRADE/URETEROSCOPY/ STONE EXTRACTION/HOLMIUM LASER/STENT PLACEMENT;  Surgeon: Mark Ottelin, MD;  Location: [facility];  Service: Urology;  Laterality: Right;   PORT-A-CATH REMOVAL Right 08/27/2022   Procedure: REMOVAL PORT-A-CATH;  Surgeon: Vernetta Berg, MD;  Location: [facility];  Service: General;  Laterality: Right;   PORTACATH PLACEMENT Right 03/26/2022   Procedure: INSERTION PORT-A-CATH;  Surgeon: Vernetta Berg, MD;  Location: [facility];  Service: General;  Laterality: Right;   RADIOACTIVE SEED GUIDED AXILLARY SENTINEL LYMPH NODE Left 08/27/2022   Procedure: RADIOACTIVE SEED GUIDED LEFT AXILLARY SENTINEL LYMPH NODE DISSECTION;  Surgeon: Vernetta Berg, MD;  Location:  SURGERY C[facility];  Service: General;  Laterality: Left;    Family History  Problem Relation Age of Onset   Hypertension Mother    Pancreatic cancer Father 55   Breast cancer Sister        dx 6s   Lung cancer Sister        d. 46    Social History   Socioeconomic History   Marital status: Single    Spouse  name: Not on file   Number of children: Not on file   Years of education: Not on file   Highest education level: Not on file  Occupational History   Not on file  Tobacco Use   Smoking status: Former    Current packs/day: 0.10    Types: Cigarettes   Smokeless tobacco: Never  Vaping Use   Vaping status: Never Used  Substance and Sexual Activity   Alcohol use: No    Alcohol/week: 0.0 standard drinks of alcohol   Drug use: No   Sexual activity:  Not Currently    Partners: Male    Birth control/protection: Surgical    Comment: declined condoms  Other Topics Concern   Not on file  Social History Narrative   Not on file   Social Drivers of Health   Financial Resource Strain: Low Risk  (08/20/2023)   Overall Financial Resource Strain (CARDIA)    Difficulty of Paying Living Expenses: Not hard at all  Food Insecurity: No Food Insecurity (08/20/2023)   Hunger Vital Sign    Worried About Running Out of Food in the Last Year: Never true    Ran Out of Food in the Last Year: Never true  Transportation Needs: No Transportation Needs (08/20/2023)   PRAPARE - Administrator, Civil Service (Medical): No    Lack of Transportation (Non-Medical): No  Physical Activity: Insufficiently Active (08/20/2023)   Exercise Vital Sign    Days of Exercise per Week: 7 days    Minutes of Exercise per Session: 10 min  Stress: No Stress Concern Present (08/20/2023)   [employer] of Occupational Health - Occupational Stress Questionnaire    Feeling of Stress : Not at all  Social Connections: Moderately Isolated (08/20/2023)   Social Connection and Isolation Panel    Frequency of Communication with Friends and Family: More than three times a week    Frequency of Social Gatherings with Friends and Family: More than three times a week    Attends Religious Services: More than 4 times per year    Active Member of [employer] or Organizations: No    Attends Banker Meetings: Never    Marital Status: Never married  Intimate Partner Violence: Not At Risk (08/20/2023)   Humiliation, Afraid, Rape, and Kick questionnaire    Fear of Current or Ex-Partner: No    Emotionally Abused: No    Physically Abused: No    Sexually Abused: No    Review of Systems  All other systems reviewed and are negative.       Objective    BP 134/76   Pulse 66   Ht 5' 2 (1.575 m)   Wt 136 lb 12.8 oz (62.1 kg)   SpO2 97%   BMI 25.02 kg/m   Physical  Exam Vitals and nursing note reviewed.  Constitutional:      General: She is not in acute distress. Cardiovascular:     Rate and Rhythm: Normal rate and regular rhythm.  Pulmonary:     Effort: Pulmonary effort is normal.     Breath sounds: Normal breath sounds.  Musculoskeletal:     Right lower leg: No edema.     Left lower leg: No edema.  Neurological:     General: No focal deficit present.     Mental Status: She is alert and oriented to person, place, and time.         Assessment & Plan:   HYPERTENSION, BENIGN -  hydroCHLOROthiazide ; Take 1 tablet (25 mg total) by mouth daily.  Dispense: 90 tablet; Refill: 1     Return in about 6 months (around 11/03/2024).   Tanda Raguel SQUIBB, MD

## 2024-05-06 ENCOUNTER — Other Ambulatory Visit: Payer: Self-pay

## 2024-05-09 ENCOUNTER — Ambulatory Visit: Attending: Surgery

## 2024-05-09 VITALS — Wt 137.4 lb

## 2024-05-09 DIAGNOSIS — Z483 Aftercare following surgery for neoplasm: Secondary | ICD-10-CM | POA: Insufficient documentation

## 2024-05-09 NOTE — Therapy (Signed)
 OUTPATIENT PHYSICAL THERAPY SOZO SCREENING NOTE   Patient Name: Veronica Robles MRN: [id number] DOB:Nov 09, 1955, 68 y.o., female Today's Date: 05/09/2024  PCP: Blush Bleacher, MD REFERRING PROVIDER: Vernetta Berg, MD   PT End of Session - 05/09/24 1533     Visit Number 7   # unchanged due to screen only   PT Start Time 1532    PT Stop Time 1536    PT Time Calculation (min) 4 min    Activity Tolerance Patient tolerated treatment well    Behavior During Therapy [facility] for tasks assessed/performed          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   Family history of breast cancer 03/20/2022   Family history of pancreatic cancer 03/20/2022   HIV infection (HCC) 1993   Hypertension    Past Surgical History:  Procedure Laterality Date   ABDOMINAL HYSTERECTOMY     APPENDECTOMY     BREAST BIOPSY Right 2015   BREAST BIOPSY Left 08/26/2022   US  LT RADIOACTIVE SEED LOC 08/26/2022 [facility]   BREAST BIOPSY  08/26/2022   MM LT RADIOACTIVE SEED LOC MAMMO GUIDE 08/26/2022 [facility]   BREAST LUMPECTOMY WITH RADIOACTIVE SEED LOCALIZATION Left 08/27/2022   Procedure: LEFT BREAST LUMPECTOMY WITH RADIOACTIVE SEED LOCALIZATION;  Surgeon: Vernetta Berg, MD;  Location: [facility];  Service: General;  Laterality: Left;   CYSTOSCOPY/RETROGRADE/URETEROSCOPY Right 11/14/2016   Procedure: CYSTOSCOPY/RETROGRADE/URETEROSCOPY/ STONE EXTRACTION/HOLMIUM LASER/STENT PLACEMENT;  Surgeon: Mark Ottelin, MD;  Location: [facility];  Service: Urology;  Laterality: Right;   PORT-A-CATH REMOVAL Right 08/27/2022   Procedure: REMOVAL PORT-A-CATH;  Surgeon: Vernetta Berg, MD;  Location: [facility];  Service: General;  Laterality: Right;   PORTACATH PLACEMENT Right 03/26/2022   Procedure: INSERTION PORT-A-CATH;  Surgeon: Vernetta Berg, MD;  Location: [facility];  Service: General;  Laterality:  Right;   RADIOACTIVE SEED GUIDED AXILLARY SENTINEL LYMPH NODE Left 08/27/2022   Procedure: RADIOACTIVE SEED GUIDED LEFT AXILLARY SENTINEL LYMPH NODE DISSECTION;  Surgeon: Vernetta Berg, MD;  Location: [facility];  Service: General;  Laterality: Left;   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  3 of 4 lymph nodes positive. Neoadjuvant chemotherapy completed and will be having radiation. It is ER positive, PR negative, and HER2 negative with a Ki67 of 10%. She has a positive axillary node. She also has HIV disease which she has not shared with her family.   PRECAUTIONS: left UE Lymphedema risk, None  SUBJECTIVE: Pt returns for her 3 month L-Dex screen.   PAIN:  Are you having pain? No  SOZO SCREENING: Patient was  assessed today using the SOZO machine to determine the lymphedema index score. This was compared to her baseline score. It was determined that she is within the recommended range when compared to her baseline and no further action is needed at this time. She will continue SOZO screenings. These are done every 3  months for 2 years post operatively followed by every 6 months for 2 years, and then annually.   L-DEX FLOWSHEETS - 05/09/24 1500       L-DEX LYMPHEDEMA SCREENING   Measurement Type Unilateral    L-DEX MEASUREMENT EXTREMITY Upper Extremity    POSITION  Standing    DOMINANT SIDE Right    At Risk Side Left    BASELINE SCORE (UNILATERAL) 0.2    L-DEX SCORE (UNILATERAL) 1    VALUE CHANGE (UNILAT) 0.8            Aden Berwyn Caldron, PTA 05/09/2024, 3:35 PM

## 2024-05-13 ENCOUNTER — Other Ambulatory Visit: Payer: Self-pay

## 2024-05-16 ENCOUNTER — Other Ambulatory Visit (HOSPITAL_COMMUNITY): Payer: Self-pay

## 2024-05-23 ENCOUNTER — Other Ambulatory Visit: Payer: Self-pay

## 2024-05-23 ENCOUNTER — Other Ambulatory Visit (HOSPITAL_COMMUNITY): Payer: Self-pay

## 2024-05-23 NOTE — Progress Notes (Signed)
 Specialty Pharmacy Refill Coordination Note  Veronica Robles is a 68 y.o. female contacted today regarding refills of specialty medication(s) Emtricitabine -Tenofovir  AF (Descovy ); Fostemsavir Tromethamine  (Rukobia )   Patient requested Pickup at [facility] at [place] date: 05/26/24   Medication will be filled on: 05/25/24

## 2024-05-26 ENCOUNTER — Ambulatory Visit (INDEPENDENT_AMBULATORY_CARE_PROVIDER_SITE_OTHER): Admitting: Internal Medicine

## 2024-05-26 ENCOUNTER — Other Ambulatory Visit (HOSPITAL_COMMUNITY): Payer: Self-pay

## 2024-05-26 ENCOUNTER — Other Ambulatory Visit: Payer: Self-pay

## 2024-05-26 ENCOUNTER — Encounter: Payer: Self-pay | Admitting: Internal Medicine

## 2024-05-26 VITALS — BP 116/76 | HR 82 | Temp 99.2°F | Ht 62.0 in | Wt 136.0 lb

## 2024-05-26 DIAGNOSIS — Z23 Encounter for immunization: Secondary | ICD-10-CM | POA: Diagnosis not present

## 2024-05-26 DIAGNOSIS — Z113 Encounter for screening for infections with a predominantly sexual mode of transmission: Secondary | ICD-10-CM

## 2024-05-26 DIAGNOSIS — I10 Essential (primary) hypertension: Secondary | ICD-10-CM | POA: Diagnosis not present

## 2024-05-26 DIAGNOSIS — B20 Human immunodeficiency virus [HIV] disease: Secondary | ICD-10-CM

## 2024-05-26 DIAGNOSIS — Z79899 Other long term (current) drug therapy: Secondary | ICD-10-CM | POA: Diagnosis not present

## 2024-05-26 MED ORDER — DESCOVY 200-25 MG PO TABS
1.0000 | ORAL_TABLET | Freq: Every day | ORAL | 5 refills | Status: DC
  Filled 2024-06-22: qty 30, 30d supply, fill #0
  Filled 2024-07-20: qty 30, 30d supply, fill #1
  Filled 2024-08-18: qty 30, 30d supply, fill #2

## 2024-05-26 MED ORDER — RUKOBIA 600 MG PO TB12
600.0000 mg | ORAL_TABLET | Freq: Two times a day (BID) | ORAL | 5 refills | Status: DC
  Filled 2024-06-22: qty 60, 30d supply, fill #0
  Filled 2024-07-20: qty 60, 30d supply, fill #1
  Filled 2024-08-18: qty 60, 30d supply, fill #2

## 2024-05-26 NOTE — Progress Notes (Signed)
 Patient ID: Veronica Robles, female   DOB: [birth year], 68 y.o.   MRN: [id number]  HPI Veronica Robles is a 68yo F with HIV disease, CD 4 count of 299/VL <20 in July 2025. She has been living with hiv disease, 32 yrs.  Continues to do well with adherence. Sunlenca  injection on sept 9th and continues to take Rukobia  and Descovy  daily. Overall she reports doing well.  Outpatient Encounter Medications as of 05/26/2024  Medication Sig   anastrozole  (ARIMIDEX ) 1 MG tablet TAKE 1 TABLET(1 MG) BY MOUTH DAILY   Blood Pressure Monitoring (ADULT BLOOD PRESSURE CUFF LG) KIT     emtricitabine -tenofovir  AF (DESCOVY ) 200-25 MG tablet Take 1 tablet by mouth daily.   fostemsavir tromethamine  (RUKOBIA ) 600 MG TB12 ER tablet Take 1 tablet (600 mg total) by mouth 2 (two) times daily.   hydrochlorothiazide  (HYDRODIURIL ) 25 MG tablet Take 1 tablet (25 mg total) by mouth daily.   polyethylene glycol powder (MIRALAX ) 17 GM/SCOOP powder Take 17 g by mouth daily.   SQ injection lenacapavir (SUNLENCA ) 463.5 MG/1.5ML SQ injection Inject 3 mLs (927 mg total) into the skin every 6 (six) months. Administer each injection subcutaneously at separate sites in the abdomen (more or equal to 2 inches from the navel).   No facility-administered encounter medications on file as of 05/26/2024.     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   HYPERTENSION, BENIGN 01/26/2007   SHINGLES, RECURRENT 12/08/2006   HIV disease (HCC)  10/26/2006   HX, PERSONAL, PENICILLIN ALLERGY 10/26/2006   HX, PERSONAL, DRUG ALLERGY NOS 10/26/2006     Health Maintenance Due  Topic Date Due   Colonoscopy  Never done   Pneumococcal Vaccine: 50+ Years (4 of 4 - PCV20 or PCV21) 05/11/2023   Influenza Vaccine  02/26/2024   COVID-19 Vaccine (8 - 2025-26 season) 03/28/2024     Review of Systems 12 pont ros is otherwise negative Physical Exam   BP 116/76   Pulse 82   Temp 99.2 F (37.3 C) (Oral)   Ht 5' 2 (1.575 m)   Wt 136 lb (61.7 kg)   SpO2 98%   BMI 24.87 kg/m   Physical Exam  Constitutional:  oriented to person, place, and time. appears well-developed and well-nourished. No distress.  HENT: [place]/AT, PERRLA, no scleral icterus Mouth/Throat: Oropharynx is clear and moist. No oropharyngeal exudate.  Cardiovascular: Normal rate, regular rhythm and normal heart sounds. Exam reveals no gallop and no friction rub.  No murmur heard.  Pulmonary/Chest: Effort normal and breath sounds normal. No respiratory distress.  has no wheezes.  Lymphadenopathy: no cervical adenopathy. No axillary adenopathy Neurological: alert and oriented to person, place, and time.  Skin: Skin is warm and dry. No rash noted. No erythema.  Psychiatric: a normal mood and affect.  behavior is normal.   Lab Results  Component Value Date   CD4TCELL 18 (L) 02/11/2024   Lab Results  Component Value Date   CD4TABS 299 (L) 02/11/2024   CD4TABS 277 (L) 08/27/2023   CD4TABS  218 (L) 02/03/2023   Lab Results  Component Value Date   HIV1RNAQUANT NOT DETECTED 02/11/2024   Lab Results  Component Value Date   HEPBSAB INDETER (A) 08/08/2009   Lab Results  Component Value Date   LABRPR NON-REACTIVE 08/27/2023    CBC Lab Results  Component Value Date   WBC 5.4 02/11/2024   RBC 4.34 02/11/2024   HGB 12.1 02/11/2024   HCT 38.1 02/11/2024   PLT 163 02/11/2024   MCV 87.8 02/11/2024   MCH 27.9 02/11/2024   MCHC 31.8 (L) 02/11/2024   RDW 13.4 02/11/2024    LYMPHSABS 1,258 02/03/2023   MONOABS 0.4 08/08/2022   EOSABS 59 02/11/2024    BMET Lab Results  Component Value Date   NA 140 02/11/2024   K 3.7 02/11/2024   CL 102 02/11/2024   CO2 28 02/11/2024   GLUCOSE 124 (H) 02/11/2024   BUN 10 02/11/2024   CREATININE 0.91 02/11/2024   CALCIUM 9.4 02/11/2024   GFRNONAA >60 08/08/2022   GFRAA 73 11/01/2020      Assessment and Plan  Hiv disease= continue current regimen. Getting labs to see that she continues to be undetectable  Long term medications = will check cr  Htn =  well controlled. No change of regimen  Hx of breast cancer = continues on amiredeix  Health maintenance = will need flu and covid vaccine, to get it today.

## 2024-05-27 ENCOUNTER — Encounter: Payer: Self-pay | Admitting: Hematology and Oncology

## 2024-05-28 ENCOUNTER — Encounter: Payer: Self-pay | Admitting: Hematology and Oncology

## 2024-05-28 LAB — CBC WITH DIFFERENTIAL/PLATELET
Absolute Lymphocytes: 2275 {cells}/uL (ref 850–3900)
Absolute Monocytes: 506 {cells}/uL (ref 200–950)
Basophils Absolute: 18 {cells}/uL (ref 0–200)
Basophils Relative: 0.3 %
Eosinophils Absolute: 49 {cells}/uL (ref 15–500)
Eosinophils Relative: 0.8 %
HCT: 38 % (ref 35.0–45.0)
Hemoglobin: 12.5 g/dL (ref 11.7–15.5)
MCH: 28.6 pg (ref 27.0–33.0)
MCHC: 32.9 g/dL (ref 32.0–36.0)
MCV: 87 fL (ref 80.0–100.0)
MPV: 9.9 fL (ref 7.5–12.5)
Monocytes Relative: 8.3 %
Neutro Abs: 3251 {cells}/uL (ref 1500–7800)
Neutrophils Relative %: 53.3 %
Platelets: 185 Thousand/uL (ref 140–400)
RBC: 4.37 Million/uL (ref 3.80–5.10)
RDW: 13.1 % (ref 11.0–15.0)
Total Lymphocyte: 37.3 %
WBC: 6.1 Thousand/uL (ref 3.8–10.8)

## 2024-05-28 LAB — COMPLETE METABOLIC PANEL WITHOUT GFR
AG Ratio: 1.7 (calc) (ref 1.0–2.5)
ALT: 20 U/L (ref 6–29)
AST: 21 U/L (ref 10–35)
Albumin: 4.3 g/dL (ref 3.6–5.1)
Alkaline phosphatase (APISO): 94 U/L (ref 37–153)
BUN: 13 mg/dL (ref 7–25)
CO2: 26 mmol/L (ref 20–32)
Calcium: 9.6 mg/dL (ref 8.6–10.4)
Chloride: 102 mmol/L (ref 98–110)
Creat: 0.91 mg/dL (ref 0.50–1.05)
Globulin: 2.6 g/dL (ref 1.9–3.7)
Glucose, Bld: 116 mg/dL — ABNORMAL HIGH (ref 65–99)
Potassium: 3.6 mmol/L (ref 3.5–5.3)
Sodium: 139 mmol/L (ref 135–146)
Total Bilirubin: 0.5 mg/dL (ref 0.2–1.2)
Total Protein: 6.9 g/dL (ref 6.1–8.1)

## 2024-05-28 LAB — HIV-1 RNA QUANT-NO REFLEX-BLD
HIV 1 RNA Quant: <20 {copies}/mL — AB
HIV-1 RNA Quant, Log: <1.3 {Log_copies}/mL — AB

## 2024-05-28 LAB — RPR: RPR Ser Ql: NONREACTIVE

## 2024-05-30 LAB — T-HELPER CELL (CD4) - (RCID CLINIC ONLY)
CD4 % Helper T Cell: 19 % — ABNORMAL LOW (ref 33–65)
CD4 T Cell Abs: 406 /uL (ref 400–1790)

## 2024-06-16 ENCOUNTER — Other Ambulatory Visit (HOSPITAL_COMMUNITY): Payer: Self-pay

## 2024-06-21 ENCOUNTER — Other Ambulatory Visit: Payer: Self-pay | Admitting: *Deleted

## 2024-06-21 ENCOUNTER — Telehealth: Payer: Self-pay | Admitting: *Deleted

## 2024-06-21 DIAGNOSIS — C50412 Malignant neoplasm of upper-outer quadrant of left female breast: Secondary | ICD-10-CM

## 2024-06-21 NOTE — Telephone Encounter (Signed)
 Error

## 2024-06-21 NOTE — Progress Notes (Signed)
 Received call from pt with complaint of left shoulder pain and left hand intermittent tingling. Pt denies redness or warmth to extremity.  Pre MD pt needing to be seen by PT for lymphedema eval and tx.  Orders placed, pt notified and verbalized understanding.

## 2024-06-22 ENCOUNTER — Other Ambulatory Visit: Payer: Self-pay

## 2024-06-22 NOTE — Progress Notes (Signed)
 Specialty Pharmacy Refill Coordination Note  Veronica Robles is a 68 y.o. female contacted today regarding refills of specialty medication(s) Emtricitabine -Tenofovir  AF (Descovy ); Fostemsavir Tromethamine  (Rukobia )   Patient requested Pickup at [facility] at [place] date: 06/27/24   Medication will be filled on: 06/24/24

## 2024-07-04 NOTE — Therapy (Signed)
 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   Family history of breast cancer 03/20/2022   Family history of pancreatic cancer 03/20/2022   HIV infection (HCC) 1993   Hypertension    Past Surgical History:  Procedure Laterality Date   ABDOMINAL HYSTERECTOMY     APPENDECTOMY     BREAST BIOPSY Right 2015   BREAST BIOPSY Left 08/26/2022   US  LT RADIOACTIVE SEED LOC 08/26/2022 [facility]   BREAST BIOPSY  08/26/2022   MM LT RADIOACTIVE SEED LOC MAMMO GUIDE 08/26/2022 [facility]   BREAST LUMPECTOMY WITH RADIOACTIVE SEED LOCALIZATION Left 08/27/2022   Procedure: LEFT BREAST LUMPECTOMY WITH RADIOACTIVE SEED LOCALIZATION;  Surgeon: Vernetta Berg, MD;  Location: [facility];  Service: General;  Laterality: Left;   CYSTOSCOPY/RETROGRADE/URETEROSCOPY Right 11/14/2016   Procedure: CYSTOSCOPY/RETROGRADE/URETEROSCOPY/ STONE EXTRACTION/HOLMIUM LASER/STENT PLACEMENT;  Surgeon: Mark Ottelin, MD;  Location: [facility];  Service: Urology;  Laterality: Right;   PORT-A-CATH REMOVAL Right 08/27/2022   Procedure: REMOVAL PORT-A-CATH;  Surgeon: Vernetta Berg, MD;  Location: [facility];  Service: General;  Laterality: Right;   PORTACATH PLACEMENT Right 03/26/2022   Procedure: INSERTION PORT-A-CATH;  Surgeon: Vernetta Berg, MD;  Location: [facility];  Service: General;  Laterality: Right;    RADIOACTIVE SEED GUIDED AXILLARY SENTINEL LYMPH NODE Left 08/27/2022   Procedure: RADIOACTIVE SEED GUIDED LEFT AXILLARY SENTINEL LYMPH NODE DISSECTION;  Surgeon: Vernetta Berg, MD;  Location: [facility];  Service: General;  Laterality: Left;   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   HYPERTENSION, BENIGN 01/26/2007   SHINGLES, RECURRENT 12/08/2006   HIV disease (HCC) 10/26/2006   HX, PERSONAL, PENICILLIN ALLERGY 10/26/2006   HX, PERSONAL, DRUG ALLERGY NOS 10/26/2006    PCP: Raguel Blush, MD  REFERRING PROVIDER: Odean Potts, MD  REFERRING DIAG:  C50.412,Z17.0 (ICD-10-CM) - Malignant neoplasm of upper-outer quadrant of left breast in female, estrogen receptor positive (HCC)  THERAPY DIAG:  Pain in left arm  Stiffness of left shoulder, not elsewhere classified  Abnormal posture  Malignant neoplasm of upper-outer quadrant of left breast in female, estrogen receptor positive (HCC)  ONSET DATE: 06/14/24  Rationale for Evaluation and Treatment: Rehabilitation  SUBJECTIVE:  SUBJECTIVE STATEMENT: I have a numbness and tingling in my L arm. When I lay down it does not feel as bad. My finger and wrist are a little swollen but my ldex score was normal.   PERTINENT HISTORY: Post left  lumpectomy 08/27/22.  3 of 4 lymph nodes positive. Neoadjuvant chemotherapy completed and radiation. It is ER positive, PR negative, and HER2 negative with a Ki67 of 10%. She has a positive axillary node. HIV  PAIN:  Are you having pain? Yes NPRS scale: 8-10/10 Pain location: LUE and hand Pain orientation: Left  PAIN TYPE: throbbing, numbness and tingling Pain description: intermittent  Aggravating factors: nothing Relieving factors: lying down  PRECAUTIONS: Other: at risk of lymphedema  RED FLAGS: None   WEIGHT BEARING RESTRICTIONS: No  FALLS:  Has patient fallen in last 6 months? No  LIVING ENVIRONMENT: Lives with: brother Lives in: House/apartment Stairs: No;  Has following equipment at home: None  OCCUPATION: retired  LEISURE: active with her grandkids, does arm exercises, walks, leg exercises  HAND DOMINANCE: right   PRIOR LEVEL OF FUNCTION: Independent  PATIENT GOALS: to decrease the pain   OBJECTIVE: Note: Objective measures were completed at Evaluation unless otherwise noted.  COGNITION: Overall cognitive status: Within functional limits for tasks assessed   PALPATION: Increased muscle tightness noted in L upper traps, scalenes, lateral and posterior cervical muscles  OBSERVATIONS / OTHER ASSESSMENTS: very mild edema present at posterior wrist and 2nd digit on L  POSTURE: forward head, rounded shoulders  UPPER EXTREMITY AROM/PROM:  A/PROM RIGHT   eval   Shoulder extension 69  Shoulder flexion 154  Shoulder abduction 172  Shoulder internal rotation 51  Shoulder external rotation 97    (Blank rows = not tested)  A/PROM LEFT   eval  Shoulder extension 61  Shoulder flexion 141  Shoulder abduction 154  Shoulder internal rotation 64  Shoulder external rotation 85    (Blank rows = not tested)  CERVICAL AROM: All within normal limits:    Percent limited  Flexion WFL  Extension WFL  Right lateral flexion WFL  Left lateral flexion WFL   Right rotation WFL  Left rotation WFL    UPPER EXTREMITY STRENGTH: Shoulders 4/5  LYMPHEDEMA ASSESSMENTS:   SURGERY TYPE/DATE: L lumpectomy 08/27/22  NUMBER OF LYMPH NODES REMOVED: 3 of 4 nodes positive  CHEMOTHERAPY: completed  RADIATION:completed  HORMONE TREATMENT: yes anastrozole   INFECTIONS: none   LYMPHEDEMA ASSESSMENTS:   LANDMARK RIGHT  eval  At axilla  31  15 cm proximal to the proximal aspect of the olecranon process 27.5  10 cm proximal to the proximal aspect of the olecranon process 25  Olecranon process 23  15 cm proximal to the proximal aspect of the ulnar styloid process 22  10 cm proximal to the proximal aspect of the ulnar styloid process 19.5  Just distal to the ulnar styloid process 15  Across hand at thumb web space 18  At base of 2nd digit 6.3  (Blank rows = not tested)  LANDMARK LEFT  eval  At axilla  30.5  15 cm proximal to the proximal aspect of the olecranon process 26.9  10 cm proximal to the proximal aspect of the olecranon process 25.5  Olecranon process 22.5  15 cm proximal to the proximal aspect of the ulnar styloid process 21.2  10 cm proximal to  the proximal aspect of the ulnar styloid process 18.5  Just distal to the ulnar styloid process 15.6  Across hand at thumb  web space 18  At base of 2nd digit 6.3  (Blank rows = not tested)  Chest circumference just inferior to the axillae:  Chest circumference at the largest point:      L-DEX FLOWSHEETS - 07/05/24 1200       L-DEX LYMPHEDEMA SCREENING   Measurement Type Unilateral    L-DEX MEASUREMENT EXTREMITY Upper Extremity    POSITION  Standing    DOMINANT SIDE Right    At Risk Side Left    BASELINE SCORE (UNILATERAL) 0.2    L-DEX SCORE (UNILATERAL) -1.3    VALUE CHANGE (UNILAT) -1.5           L-DEX LYMPHEDEMA SCREENING: The patient was assessed using the L-Dex machine today to produce a lymphedema index baseline score. The patient will be reassessed on a regular  basis (typically every 3 months) to obtain new L-Dex scores. If the score is > 6.5 points away from his/her baseline score indicating onset of subclinical lymphedema, it will be recommended to wear a compression garment for 4 weeks, 12 hours per day and then be reassessed. If the score continues to be > 6.5 points from baseline at reassessment, we will initiate lymphedema treatment. Assessing in this manner has a 95% rate of preventing clinically significant lymphedema.  QUICK DASH SURVEY:   Junie Palin - 07/05/24 0001     Open a tight or new jar Moderate difficulty    Do heavy household chores (wash walls, wash floors) Moderate difficulty    Carry a shopping bag or briefcase Moderate difficulty    Wash your back Moderate difficulty    Use a knife to cut food Moderate difficulty    Recreational activities in which you take some force or impact through your arm, shoulder, or hand (golf, hammering, tennis) Moderate difficulty    During the past week, to what extent has your arm, shoulder or hand problem interfered with your normal social activities with family, friends, neighbors, or groups? Quite a bit    During the past week, to what extent has your arm, shoulder or hand problem limited your work or other regular daily activities Modererately    Arm, shoulder, or hand pain. Severe    Tingling (pins and needles) in your arm, shoulder, or hand Extreme    Difficulty Sleeping Moderate difficulty    DASH Score 59.09 %                                                                                                                                     TREATMENT DATE:  07/05/24: Educated pt on how posture and muscle tension can effect nerves in neck causing increased pain and tingling in LUE, educated pt in importance of posture (decreasing forward head and rounded shoulders) Neck retraction x 7 reps with 5 sec holds with v/c to keep neck neurtral during retraction, then with purple ball x 3  reps Scapular retraction with  v/c and t/c to squeeze back and down x 10 with the last few paired with neck retraction (issued both as part of HEP)    PATIENT EDUCATION:  Education details: importance of posture for decreasing muscle tension and pain in LUE Person educated: Patient Education method: Explanation Education comprehension: verbalized understanding  HOME EXERCISE PROGRAM: Access Code: [email or web address] URL: [email or web address] Date: 07/05/2024 Prepared by: Florina Lanis Carbon  Exercises - Seated Cervical Retraction  - 1 x daily - 7 x weekly - 1 sets - 10 reps - 3-5 sec hold - Seated Scapular Retraction  - 1 x daily - 7 x weekly - 1 sets - 10 reps - 3-5 sec hold  ASSESSMENT:  CLINICAL IMPRESSION: Patient is a 68 y.o. female who was seen today for physical therapy evaluation and treatment for L UE pain. She reports her pain began 3 weeks ago. She reports she has a lot of muscle tension due to recent life events. There is palpable tightness in L upper traps, levator, posterior and lateral cervical muscles. She has pronounced forward head, rounded shoulders. Her L shoulder ROM is limited compared to R. She has some very mild edema at posterior wrist and 2nd digit on L but ldex score was normal indicating this is not lymphedema. Pt would benefit from skilled PT services to improve posture, decrease muscle tightness, improve L shoulder ROM and decrease L UE pain to allow improved function.     OBJECTIVE IMPAIRMENTS: decreased knowledge of condition, decreased strength, increased fascial restrictions, increased muscle spasms, impaired UE functional use, postural dysfunction, and pain.   ACTIVITY LIMITATIONS: carrying, lifting, and reach over head  PARTICIPATION LIMITATIONS: cleaning and laundry  PERSONAL FACTORS: Past/current experiences hx of radiation are also affecting patient's functional outcome.   REHAB POTENTIAL: Good  CLINICAL DECISION MAKING:  Stable/uncomplicated  EVALUATION COMPLEXITY: Low  GOALS: Goals reviewed with patient? Yes   LONG TERM GOALS=SHORT TERM GOALS Target date: 08/02/24  Pt will report a 75% improvement in pain in LUE to allow pt improved comfort and function. Baseline:  Goal status: INITIAL  2.  Pt will demonstrate 150 degrees of L shoulder flexion to allow her to reach up overhead. Baseline:  Goal status: INITIAL  3.  Pt will demonstrate 160 degrees of L shoulder abduction to allow her to reach to the side. Baseline:  Goal status: INITIAL  4.  Pt will be independent in a home exercise program to continue to improve posture and postural awareness. Baseline:  Goal status: INITIAL  PLAN:  PT FREQUENCY: 2x/week  PT DURATION: 4 weeks  PLANNED INTERVENTIONS: [id number]- PT Re-evaluation, 97110-Therapeutic exercises, 97530- Therapeutic activity, 97112- Neuromuscular re-education, 97535- Self Care, [id number]- Manual therapy, [phone]- Orthotic Initial, [phone]- Orthotic/Prosthetic subsequent, Patient/Family education, Balance training, Joint mobilization, Therapeutic exercises, Therapeutic activity, Neuromuscular re-education, Gait training, and Self Care  PLAN FOR NEXT SESSION: STM to L neck and shoulders especially upper traps, levator, posture exercises, L shoulder ROM  [employer], PT 07/05/2024, 1:01 PM

## 2024-07-05 ENCOUNTER — Ambulatory Visit: Admitting: Physical Therapy

## 2024-07-05 ENCOUNTER — Other Ambulatory Visit: Payer: Self-pay

## 2024-07-05 ENCOUNTER — Encounter: Payer: Self-pay | Admitting: Physical Therapy

## 2024-07-05 DIAGNOSIS — Z170 Estrogen receptor positive status [ER+]: Secondary | ICD-10-CM | POA: Diagnosis present

## 2024-07-05 DIAGNOSIS — M25612 Stiffness of left shoulder, not elsewhere classified: Secondary | ICD-10-CM | POA: Insufficient documentation

## 2024-07-05 DIAGNOSIS — M79602 Pain in left arm: Secondary | ICD-10-CM | POA: Insufficient documentation

## 2024-07-05 DIAGNOSIS — C50412 Malignant neoplasm of upper-outer quadrant of left female breast: Secondary | ICD-10-CM | POA: Diagnosis present

## 2024-07-05 DIAGNOSIS — R293 Abnormal posture: Secondary | ICD-10-CM | POA: Insufficient documentation

## 2024-07-06 ENCOUNTER — Ambulatory Visit

## 2024-07-06 DIAGNOSIS — M79602 Pain in left arm: Secondary | ICD-10-CM

## 2024-07-06 DIAGNOSIS — Z170 Estrogen receptor positive status [ER+]: Secondary | ICD-10-CM

## 2024-07-06 DIAGNOSIS — R293 Abnormal posture: Secondary | ICD-10-CM

## 2024-07-06 DIAGNOSIS — M25612 Stiffness of left shoulder, not elsewhere classified: Secondary | ICD-10-CM

## 2024-07-06 NOTE — Therapy (Signed)
 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   Family history of breast cancer 03/20/2022   Family history of pancreatic cancer 03/20/2022   HIV infection (HCC) 1993   Hypertension    Past Surgical History:  Procedure Laterality Date   ABDOMINAL HYSTERECTOMY     APPENDECTOMY     BREAST BIOPSY Right 2015   BREAST BIOPSY Left 08/26/2022   US  LT RADIOACTIVE SEED LOC 08/26/2022 [facility]   BREAST BIOPSY  08/26/2022   MM LT RADIOACTIVE SEED LOC MAMMO GUIDE 08/26/2022 [facility]   BREAST LUMPECTOMY WITH RADIOACTIVE SEED LOCALIZATION Left 08/27/2022   Procedure: LEFT BREAST LUMPECTOMY WITH RADIOACTIVE SEED LOCALIZATION;  Surgeon: Vernetta Berg, MD;  Location: [facility];  Service: General;  Laterality: Left;   CYSTOSCOPY/RETROGRADE/URETEROSCOPY Right 11/14/2016   Procedure: CYSTOSCOPY/RETROGRADE/URETEROSCOPY/ STONE EXTRACTION/HOLMIUM LASER/STENT PLACEMENT;  Surgeon: Mark Ottelin, MD;  Location: [facility];  Service: Urology;  Laterality: Right;   PORT-A-CATH REMOVAL Right 08/27/2022   Procedure: REMOVAL PORT-A-CATH;  Surgeon: Vernetta Berg, MD;  Location: [facility];  Service: General;  Laterality: Right;   PORTACATH PLACEMENT Right 03/26/2022   Procedure: INSERTION PORT-A-CATH;  Surgeon: Vernetta Berg, MD;  Location: [facility];  Service: General;  Laterality: Right;    RADIOACTIVE SEED GUIDED AXILLARY SENTINEL LYMPH NODE Left 08/27/2022   Procedure: RADIOACTIVE SEED GUIDED LEFT AXILLARY SENTINEL LYMPH NODE DISSECTION;  Surgeon: Vernetta Berg, MD;  Location:  [facility];  Service: General;  Laterality: Left;   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   HYPERTENSION, BENIGN 01/26/2007   SHINGLES, RECURRENT 12/08/2006   HIV disease (HCC) 10/26/2006   HX, PERSONAL, PENICILLIN ALLERGY 10/26/2006   HX, PERSONAL, DRUG ALLERGY NOS 10/26/2006    PCP: Raguel Blush, MD  REFERRING PROVIDER: Odean Potts, MD  REFERRING DIAG:  C50.412,Z17.0 (ICD-10-CM) - Malignant neoplasm of upper-outer quadrant of left breast in female, estrogen receptor positive (HCC)  THERAPY DIAG:  Pain in left arm  Stiffness of left shoulder, not elsewhere classified  Abnormal posture  Malignant neoplasm of upper-outer quadrant of left breast in female, estrogen receptor positive (HCC)  ONSET DATE: 06/14/24  Rationale for Evaluation and Treatment: Rehabilitation  SUBJECTIVE:  SUBJECTIVE STATEMENT: I took one of the 2 pillows away that I normally sleep on like Blaire suggested and that was fine.   PERTINENT HISTORY: Post left lumpectomy 08/27/22.  3 of 4 lymph nodes positive.  Neoadjuvant chemotherapy completed and radiation. It is ER positive, PR negative, and HER2 negative with a Ki67 of 10%. She has a positive axillary node. HIV  PAIN:  Are you having pain? Yes NPRS scale: 5/10 Pain location: LUE and hand Pain orientation: Left  PAIN TYPE: throbbing, numbness and tingling Pain description: intermittent  Aggravating factors: nothing Relieving factors: lying down  PRECAUTIONS: Other: at risk of lymphedema  RED FLAGS: None   WEIGHT BEARING RESTRICTIONS: No  FALLS:  Has patient fallen in last 6 months? No  LIVING ENVIRONMENT: Lives with: brother Lives in: House/apartment Stairs: No;  Has following equipment at home: None  OCCUPATION: retired  LEISURE: active with her grandkids, does arm exercises, walks, leg exercises  HAND DOMINANCE: right   PRIOR LEVEL OF FUNCTION: Independent  PATIENT GOALS: to decrease the pain   OBJECTIVE: Note: Objective measures were completed at Evaluation unless otherwise noted.  COGNITION: Overall cognitive status: Within functional limits for tasks assessed   PALPATION: Increased muscle tightness noted in L upper traps, scalenes, lateral and posterior cervical muscles  OBSERVATIONS / OTHER ASSESSMENTS: very mild edema present at posterior wrist and 2nd digit on L  POSTURE: forward head, rounded shoulders  UPPER EXTREMITY AROM/PROM:  A/PROM RIGHT   eval   Shoulder extension 69  Shoulder flexion 154  Shoulder abduction 172  Shoulder internal rotation 51  Shoulder external rotation 97    (Blank rows = not tested)  A/PROM LEFT   eval  Shoulder extension 61  Shoulder flexion 141  Shoulder abduction 154  Shoulder internal rotation 64  Shoulder external rotation 85    (Blank rows = not tested)  CERVICAL AROM: All within normal limits:    Percent limited  Flexion WFL  Extension WFL  Right lateral flexion WFL  Left lateral flexion WFL  Right rotation WFL  Left rotation WFL    UPPER  EXTREMITY STRENGTH: Shoulders 4/5  LYMPHEDEMA ASSESSMENTS:   SURGERY TYPE/DATE: L lumpectomy 08/27/22  NUMBER OF LYMPH NODES REMOVED: 3 of 4 nodes positive  CHEMOTHERAPY: completed  RADIATION:completed  HORMONE TREATMENT: yes anastrozole   INFECTIONS: none   LYMPHEDEMA ASSESSMENTS:   LANDMARK RIGHT  eval  At axilla  31  15 cm proximal to the proximal aspect of the olecranon process 27.5  10 cm proximal to the proximal aspect of the olecranon process 25  Olecranon process 23  15 cm proximal to the proximal aspect of the ulnar styloid process 22  10 cm proximal to the proximal aspect of the ulnar styloid process 19.5  Just distal to the ulnar styloid process 15  Across hand at thumb web space 18  At base of 2nd digit 6.3  (Blank rows = not tested)  LANDMARK LEFT  eval  At axilla  30.5  15 cm proximal to the proximal aspect of the olecranon process 26.9  10 cm proximal to the proximal aspect of the olecranon process 25.5  Olecranon process 22.5  15 cm proximal to the proximal aspect of the ulnar styloid process 21.2  10 cm proximal to  the proximal aspect of the ulnar styloid process 18.5  Just distal to the ulnar styloid process 15.6  Across hand at thumb web space 18  At base of 2nd digit 6.3  (Blank rows =  not tested)  Chest circumference just inferior to the axillae:  Chest circumference at the largest point:         L-DEX LYMPHEDEMA SCREENING: The patient was assessed using the L-Dex machine today to produce a lymphedema index baseline score. The patient will be reassessed on a regular basis (typically every 3 months) to obtain new L-Dex scores. If the score is > 6.5 points away from his/her baseline score indicating onset of subclinical lymphedema, it will be recommended to wear a compression garment for 4 weeks, 12 hours per day and then be reassessed. If the score continues to be > 6.5 points from baseline at reassessment, we will initiate lymphedema  treatment. Assessing in this manner has a 95% rate of preventing clinically significant lymphedema.  QUICK DASH SURVEY:                                                                                                                                TREATMENT DATE:  07/06/24: Self Care Discussed importance of proper posture with ADLs, especially when she does her puzzle book at home as she reports she normally sits for about 2 hours when she does this. Also discussed how switching positions and where she sits to do her puzzles can be beneficial and to take timed rest/stretch breaks to move around. Pt able to verbalize good understanding.  Therapeutic Exercises Pulleys into flex and then abd x 2 min each returning therapist demo and VC's to relax shoulder during  Roll yellow ball up wall into flex  x4, then x 2 and pt needed to stop due to hand tingling, then Lt UE abd x 2 but conts with increased hand tingling and pain Therapeutic Activities  Supine over half foam roll with pillow under head due to forward head: Bil UE horz abd x 3 but pt was having increased tingling and pain in Lt UE so stopped, bil UE scaption in a V in a limited ROM and this was tingling but more tolerable, then bil UE abd in a snow angel x 5 with 5 sec holds then wanted to stop. Pt reports throbbing marginally improved after these exercises but roll was very uncomfortable so will try this again next session but without foam roll.  Seated in chair with head against purple ball on wall for following: Cervical retraction x 10 reps with 5 sec holds returning therapist demo and pt reports decreasing UE symptoms during reps, then cervical retraction with: Bil UE er x 10, and then alt UE flex Meeks Decompression Exercises in supine: Head press, shoulder press, leg press, and leg lengthener x 5 reps with 5 sec holds each Manual Therapy P/ROM into cervical traction and then traction with bil side bend and rotation, with and without  over pressure to ipsilateral clavicle for increased stretch STM briefly to bil cervical paraspinals and suboccipital release during P/ROM; pt reports decrease of Lt UE tingling symptoms during and after MT  07/05/24: Educated pt on how posture and muscle tension can effect nerves in neck causing increased pain and tingling in LUE, educated pt in importance of posture (decreasing forward head and rounded shoulders) Neck retraction x 7 reps with 5 sec holds with v/c to keep neck neurtral during retraction, then with purple ball x 3 reps Scapular retraction with v/c and t/c to squeeze back and down x 10 with the last few paired with neck retraction (issued both as part of HEP)    PATIENT EDUCATION:  Education details: Continued discussion of posture and added Meeks Decompression Exs  Person educated: Patient Education method: Explanation, demonstration, VC's and handout issued Education comprehension: verbalized understanding, returned demo and will benefit from further review  HOME EXERCISE PROGRAM: Access Code: [email or web address] URL: [email or web address] Date: 07/05/2024 Prepared by: Florina Lanis Carbon  Exercises - Seated Cervical Retraction  - 1 x daily - 7 x weekly - 1 sets - 10 reps - 3-5 sec hold - Seated Scapular Retraction  - 1 x daily - 7 x weekly - 1 sets - 10 reps - 3-5 sec hold  07/06/24 - Meeks Decompression Exs  ASSESSMENT:  CLINICAL IMPRESSION: Today began postural exercises working on trying to find positions that didn't flare up pts UE tingling symptoms. Supine over half foam roll and ball rolls up wall were worse. She tolerated seated head press against ball on wall very well being able report decreased UE symptoms with those along with Meeks Decompression Exs. Also after maual therapy she could tell an improvement in her neck tightness and UE symptoms.    OBJECTIVE IMPAIRMENTS: decreased knowledge of condition, decreased strength, increased fascial  restrictions, increased muscle spasms, impaired UE functional use, postural dysfunction, and pain.   ACTIVITY LIMITATIONS: carrying, lifting, and reach over head  PARTICIPATION LIMITATIONS: cleaning and laundry  PERSONAL FACTORS: Past/current experiences hx of radiation are also affecting patient's functional outcome.   REHAB POTENTIAL: Good  CLINICAL DECISION MAKING: Stable/uncomplicated  EVALUATION COMPLEXITY: Low  GOALS: Goals reviewed with patient? Yes   LONG TERM GOALS=SHORT TERM GOALS Target date: 08/02/24  Pt will report a 75% improvement in pain in LUE to allow pt improved comfort and function. Baseline:  Goal status: INITIAL  2.  Pt will demonstrate 150 degrees of L shoulder flexion to allow her to reach up overhead. Baseline:  Goal status: INITIAL  3.  Pt will demonstrate 160 degrees of L shoulder abduction to allow her to reach to the side. Baseline:  Goal status: INITIAL  4.  Pt will be independent in a home exercise program to continue to improve posture and postural awareness. Baseline:  Goal status: INITIAL  PLAN:  PT FREQUENCY: 2x/week  PT DURATION: 4 weeks  PLANNED INTERVENTIONS: 97164- PT Re-evaluation, 97110-Therapeutic exercises, 97530- Therapeutic activity, 97112- Neuromuscular re-education, 97535- Self Care, [id number]- Manual therapy, [phone]- Orthotic Initial, [phone]- Orthotic/Prosthetic subsequent, Patient/Family education, Balance training, Joint mobilization, Therapeutic exercises, Therapeutic activity, Neuromuscular re-education, Gait training, and Self Care  PLAN FOR NEXT SESSION: Cont postural stability finding exercises that decrease Lt UE symptoms; P/ROM and STM to L neck and shoulders especially upper traps, levator, posture exercises, L shoulder ROM  Aden Berwyn Caldron, PTA 07/06/2024, 1:12 PM   1. Decompression Exercise     Cancer Rehab [phone]    Lie on back on firm surface, knees bent, feet flat, arms turned up, out to sides,  backs of hands down. Time _5-15__ minutes. Surface: floor   2. Shoulder Press    Start in Decompression  Exercise position. Press shoulders downward towards supporting surface. Hold __2-3__ seconds while counting out loud. Repeat _3-5___ times. Do _1-2___ times per day.   3. Head Press    Bring cervical spine (neck) into neutral position (by either tucking the chin towards the chest or tilting the chin upward). Feel weight on back of head. Press head downward into supporting surface.    Hold _2-3__ seconds. Repeat _3-5__ times. Do _1-2__ times per day.   4. Leg Lengthener    Straighten one leg. Pull toes AND forefoot toward knee, extend heel. Lengthen leg by pulling pelvis away from ribs. Hold _2-3__ seconds. Relax. Repeat __4-6__ times. Do other leg.  Surface: floor   5. Leg Press    Straighten one leg down to floor keeping leg aligned with hip. Pull toes AND forefoot toward knee; extend heel.  Press entire leg downward (as if pressing leg into sandy beach). DO NOT BEND KNEE. Hold _2-3__ seconds. Do __4-6__ times. Repeat with other leg.

## 2024-07-06 NOTE — Patient Instructions (Addendum)
 SABRA

## 2024-07-12 ENCOUNTER — Encounter: Payer: Self-pay | Admitting: Physical Therapy

## 2024-07-12 ENCOUNTER — Ambulatory Visit: Admitting: Physical Therapy

## 2024-07-12 DIAGNOSIS — R293 Abnormal posture: Secondary | ICD-10-CM

## 2024-07-12 DIAGNOSIS — C50412 Malignant neoplasm of upper-outer quadrant of left female breast: Secondary | ICD-10-CM

## 2024-07-12 DIAGNOSIS — M79602 Pain in left arm: Secondary | ICD-10-CM | POA: Diagnosis not present

## 2024-07-12 DIAGNOSIS — M25612 Stiffness of left shoulder, not elsewhere classified: Secondary | ICD-10-CM
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
## 2024-07-14 ENCOUNTER — Ambulatory Visit: Admitting: Physical Therapy

## 2024-07-14 DIAGNOSIS — C50412 Malignant neoplasm of upper-outer quadrant of left female breast: Secondary | ICD-10-CM

## 2024-07-14 DIAGNOSIS — R293 Abnormal posture: Secondary | ICD-10-CM

## 2024-07-14 DIAGNOSIS — M25612 Stiffness of left shoulder, not elsewhere classified: Secondary | ICD-10-CM

## 2024-07-14 DIAGNOSIS — M79602 Pain in left arm: Secondary | ICD-10-CM

## 2024-07-14 NOTE — Therapy (Signed)
 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   Family history of breast cancer 03/20/2022   Family history of pancreatic cancer 03/20/2022   HIV infection (HCC) 1993   Hypertension    Past Surgical History:  Procedure Laterality Date   ABDOMINAL HYSTERECTOMY     APPENDECTOMY     BREAST BIOPSY Right 2015   BREAST BIOPSY Left 08/26/2022   US  LT RADIOACTIVE SEED LOC 08/26/2022 [facility]   BREAST BIOPSY  08/26/2022   MM LT RADIOACTIVE SEED LOC MAMMO GUIDE 08/26/2022 [facility]   BREAST LUMPECTOMY WITH RADIOACTIVE SEED LOCALIZATION Left 08/27/2022   Procedure: LEFT BREAST LUMPECTOMY WITH RADIOACTIVE SEED LOCALIZATION;  Surgeon: Vernetta Berg, MD;  Location: [facility];  Service: General;  Laterality: Left;   CYSTOSCOPY/RETROGRADE/URETEROSCOPY Right 11/14/2016   Procedure: CYSTOSCOPY/RETROGRADE/URETEROSCOPY/ STONE EXTRACTION/HOLMIUM LASER/STENT PLACEMENT;  Surgeon: Mark Ottelin, MD;  Location: [facility];  Service: Urology;  Laterality: Right;   PORT-A-CATH REMOVAL Right 08/27/2022   Procedure: REMOVAL PORT-A-CATH;  Surgeon: Vernetta Berg, MD;  Location: [facility];  Service: General;  Laterality: Right;   PORTACATH PLACEMENT Right 03/26/2022   Procedure: INSERTION PORT-A-CATH;  Surgeon: Vernetta Berg, MD;  Location: [facility];  Service: General;  Laterality: Right;    RADIOACTIVE SEED GUIDED AXILLARY SENTINEL LYMPH NODE Left 08/27/2022   Procedure: RADIOACTIVE SEED GUIDED LEFT AXILLARY SENTINEL LYMPH NODE DISSECTION;  Surgeon: Vernetta Berg, MD;  Location: [facility];  Service: General;  Laterality: Left;   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   HYPERTENSION, BENIGN 01/26/2007   SHINGLES, RECURRENT 12/08/2006   HIV disease (HCC) 10/26/2006   HX, PERSONAL, PENICILLIN ALLERGY 10/26/2006   HX, PERSONAL, DRUG ALLERGY NOS 10/26/2006    PCP: Raguel Blush, MD  REFERRING PROVIDER: Odean Potts, MD  REFERRING DIAG:  C50.412,Z17.0 (ICD-10-CM) - Malignant neoplasm of upper-outer quadrant of left breast in female, estrogen receptor positive (HCC)  THERAPY DIAG:  Pain in left arm  Stiffness of left shoulder, not elsewhere classified  Abnormal posture  Malignant neoplasm of upper-outer quadrant of left breast in female, estrogen receptor positive (HCC)  ONSET DATE: 06/14/24  Rationale for Evaluation and Treatment: Rehabilitation  SUBJECTIVE:  SUBJECTIVE STATEMENT:It is still there but it is some better.   PERTINENT HISTORY: Post left lumpectomy 08/27/22.  3 of 4 lymph nodes positive. Neoadjuvant chemotherapy completed and radiation. It is ER  positive, PR negative, and HER2 negative with a Ki67 of 10%. She has a positive axillary node. HIV  PAIN:  Are you having pain? Yes NPRS scale: 7/10 Pain location: LUE and hand Pain orientation: Left  PAIN TYPE: throbbing, numbness and tingling Pain description: intermittent  Aggravating factors: nothing Relieving factors: lying down  PRECAUTIONS: Other: at risk of lymphedema  RED FLAGS: None   WEIGHT BEARING RESTRICTIONS: No  FALLS:  Has patient fallen in last 6 months? No  LIVING ENVIRONMENT: Lives with: brother Lives in: House/apartment Stairs: No;  Has following equipment at home: None  OCCUPATION: retired  LEISURE: active with her grandkids, does arm exercises, walks, leg exercises  HAND DOMINANCE: right   PRIOR LEVEL OF FUNCTION: Independent  PATIENT GOALS: to decrease the pain   OBJECTIVE: Note: Objective measures were completed at Evaluation unless otherwise noted.  COGNITION: Overall cognitive status: Within functional limits for tasks assessed   PALPATION: Increased muscle tightness noted in L upper traps, scalenes, lateral and posterior cervical muscles  OBSERVATIONS / OTHER ASSESSMENTS: very mild edema present at posterior wrist and 2nd digit on L  POSTURE: forward head, rounded shoulders  UPPER EXTREMITY AROM/PROM:  A/PROM RIGHT   eval   Shoulder extension 69  Shoulder flexion 154  Shoulder abduction 172  Shoulder internal rotation 51  Shoulder external rotation 97    (Blank rows = not tested)  A/PROM LEFT   eval  Shoulder extension 61  Shoulder flexion 141  Shoulder abduction 154  Shoulder internal rotation 64  Shoulder external rotation 85    (Blank rows = not tested)  CERVICAL AROM: All within normal limits:    Percent limited  Flexion WFL  Extension WFL  Right lateral flexion WFL  Left lateral flexion WFL  Right rotation WFL  Left rotation WFL    UPPER EXTREMITY STRENGTH: Shoulders 4/5  LYMPHEDEMA ASSESSMENTS:    SURGERY TYPE/DATE: L lumpectomy 08/27/22  NUMBER OF LYMPH NODES REMOVED: 3 of 4 nodes positive  CHEMOTHERAPY: completed  RADIATION:completed  HORMONE TREATMENT: yes anastrozole   INFECTIONS: none   LYMPHEDEMA ASSESSMENTS:   LANDMARK RIGHT  eval  At axilla  31  15 cm proximal to the proximal aspect of the olecranon process 27.5  10 cm proximal to the proximal aspect of the olecranon process 25  Olecranon process 23  15 cm proximal to the proximal aspect of the ulnar styloid process 22  10 cm proximal to the proximal aspect of the ulnar styloid process 19.5  Just distal to the ulnar styloid process 15  Across hand at thumb web space 18  At base of 2nd digit 6.3  (Blank rows = not tested)  LANDMARK LEFT  eval  At axilla  30.5  15 cm proximal to the proximal aspect of the olecranon process 26.9  10 cm proximal to the proximal aspect of the olecranon process 25.5  Olecranon process 22.5  15 cm proximal to the proximal aspect of the ulnar styloid process 21.2  10 cm proximal to  the proximal aspect of the ulnar styloid process 18.5  Just distal to the ulnar styloid process 15.6  Across hand at thumb web space 18  At base of 2nd digit 6.3  (Blank rows = not tested)  Chest circumference just inferior to the axillae:  Chest  circumference at the largest point:         L-DEX LYMPHEDEMA SCREENING: The patient was assessed using the L-Dex machine today to produce a lymphedema index baseline score. The patient will be reassessed on a regular basis (typically every 3 months) to obtain new L-Dex scores. If the score is > 6.5 points away from his/her baseline score indicating onset of subclinical lymphedema, it will be recommended to wear a compression garment for 4 weeks, 12 hours per day and then be reassessed. If the score continues to be > 6.5 points from baseline at reassessment, we will initiate lymphedema treatment. Assessing in this manner has a 95% rate of preventing  clinically significant lymphedema.  QUICK DASH SURVEY:                                                                                                                                TREATMENT DATE:  07/14/24: Therapeutic Exercises Pulleys into flex and then abd x 2 min each returning therapist demo and VC's to relax shoulder during  Roll yellow ball up wall into flex x 5 with feeling of stretching Therapeutic Activities  Supine on large mat table:  Meeks decompression exercises with 5 sec holds x 5 each - able to do without increased tingling in arm with only 1 pillow under head, started having tingling while showing pt supine scap so therapist added 2nd pillow which alleviated tingling, overhead narrow and wide grip flexion x 7 reps each, supine shoulder ER with yellow band x 7,  horizontal abduction x 7, diagonals x 7 -with pt returning therapist demo, issued as part of HEP Seated in chair with head against purple ball on wall for following: Cervical retraction x 10 reps with 5 sec holds returning therapist demo, then cervical retraction with: alternating flexion x 10, bilateral scaption x 10  Manual Therapy Gentle cervical traction with suboccipital release STM in supine to L SCM, upper traps, levator, paraspinals   07/12/24: Therapeutic Exercises Pulleys into flex and then abd x 2 min each returning therapist demo and VC's to relax shoulder during  Roll yellow ball up wall into flex x 5 with feeling of stretching Therapeutic Activities  Supine on large mat table:  Meeks decompression exercises with 5 sec holds x 5 each - had to add pillow under head due to increased arm tingling and pain and this helped some - therapist also performed gentle STM to L shoulder/neck to help decrease discomfort, supine shoulder ER with yellow band x 7, supine alternating flexion x 7, supine scaption x 10  Seated in chair with head against purple ball on wall for following: Cervical retraction x 10 reps  with 5 sec holds returning therapist demo, then cervical retraction with: alternating flexion x 10, bilateral scaption x 10  Manual Therapy Gentle cervical traction with suboccipital release STM in supine to L SCM, upper traps, levator, paraspinals and then to R side lying to the  same and also rhomboids using cocoa butter and wave tool- pt reported relief after this  07/06/24: Self Care Discussed importance of proper posture with ADLs, especially when she does her puzzle book at home as she reports she normally sits for about 2 hours when she does this. Also discussed how switching positions and where she sits to do her puzzles can be beneficial and to take timed rest/stretch breaks to move around. Pt able to verbalize good understanding.  Therapeutic Exercises Pulleys into flex and then abd x 2 min each returning therapist demo and VC's to relax shoulder during  Roll yellow ball up wall into flex  x4, then x 2 and pt needed to stop due to hand tingling, then Lt UE abd x 2 but conts with increased hand tingling and pain Therapeutic Activities  Supine over half foam roll with pillow under head due to forward head: Bil UE horz abd x 3 but pt was having increased tingling and pain in Lt UE so stopped, bil UE scaption in a V in a limited ROM and this was tingling but more tolerable, then bil UE abd in a snow angel x 5 with 5 sec holds then wanted to stop. Pt reports throbbing marginally improved after these exercises but roll was very uncomfortable so will try this again next session but without foam roll.  Seated in chair with head against purple ball on wall for following: Cervical retraction x 10 reps with 5 sec holds returning therapist demo and pt reports decreasing UE symptoms during reps, then cervical retraction with: Bil UE er x 10, and then alt UE flex Meeks Decompression Exercises in supine: Head press, shoulder press, leg press, and leg lengthener x 5 reps with 5 sec holds each Manual  Therapy P/ROM into cervical traction and then traction with bil side bend and rotation, with and without over pressure to ipsilateral clavicle for increased stretch STM briefly to bil cervical paraspinals and suboccipital release during P/ROM; pt reports decrease of Lt UE tingling symptoms during and after MT  07/05/24: Educated pt on how posture and muscle tension can effect nerves in neck causing increased pain and tingling in LUE, educated pt in importance of posture (decreasing forward head and rounded shoulders) Neck retraction x 7 reps with 5 sec holds with v/c to keep neck neurtral during retraction, then with purple ball x 3 reps Scapular retraction with v/c and t/c to squeeze back and down x 10 with the last few paired with neck retraction (issued both as part of HEP)    PATIENT EDUCATION:  Education details: Continued discussion of posture and added Meeks Decompression Exs  Person educated: Patient Education method: Explanation, demonstration, VC's and handout issued Education comprehension: verbalized understanding, returned demo and will benefit from further review  HOME EXERCISE PROGRAM: Access Code: [email or web address] URL: [email or web address] Date: 07/05/2024 Prepared by: Florina Lanis Carbon  Exercises - Seated Cervical Retraction  - 1 x daily - 7 x weekly - 1 sets - 10 reps - 3-5 sec hold - Seated Scapular Retraction  - 1 x daily - 7 x weekly - 1 sets - 10 reps - 3-5 sec hold  07/06/24 - Meeks Decompression Exs  07/14/24- supine scapular series  ASSESSMENT:  CLINICAL IMPRESSION: Pt reports the STM last session really helped with her muscle tension. Added supine scapular series today and issued these with her HEP. Educated pt on importance of engaging scapular muscles throughout. Initially pt was able to lie in supine without increased tingling  in her L arm but eventually did need a second pillow which alleviated this. Discussed importance of good posture when  doing crosswords since pt spends a lot of time on this.     OBJECTIVE IMPAIRMENTS: decreased knowledge of condition, decreased strength, increased fascial restrictions, increased muscle spasms, impaired UE functional use, postural dysfunction, and pain.   ACTIVITY LIMITATIONS: carrying, lifting, and reach over head  PARTICIPATION LIMITATIONS: cleaning and laundry  PERSONAL FACTORS: Past/current experiences hx of radiation are also affecting patient's functional outcome.   REHAB POTENTIAL: Good  CLINICAL DECISION MAKING: Stable/uncomplicated  EVALUATION COMPLEXITY: Low  GOALS: Goals reviewed with patient? Yes   LONG TERM GOALS=SHORT TERM GOALS Target date: 08/02/24  Pt will report a 75% improvement in pain in LUE to allow pt improved comfort and function. Baseline:  Goal status: INITIAL  2.  Pt will demonstrate 150 degrees of L shoulder flexion to allow her to reach up overhead. Baseline:  Goal status: INITIAL  3.  Pt will demonstrate 160 degrees of L shoulder abduction to allow her to reach to the side. Baseline:  Goal status: INITIAL  4.  Pt will be independent in a home exercise program to continue to improve posture and postural awareness. Baseline:  Goal status: INITIAL  PLAN:  PT FREQUENCY: 2x/week  PT DURATION: 4 weeks  PLANNED INTERVENTIONS: 97164- PT Re-evaluation, 97110-Therapeutic exercises, 97530- Therapeutic activity, 97112- Neuromuscular re-education, 97535- Self Care, [id number]- Manual therapy, [phone]- Orthotic Initial, [phone]- Orthotic/Prosthetic subsequent, Patient/Family education, Balance training, Joint mobilization, Therapeutic exercises, Therapeutic activity, Neuromuscular re-education, Gait training, and Self Care  PLAN FOR NEXT SESSION: Cont postural stability finding exercises that decrease Lt UE symptoms; P/ROM and STM to L neck and shoulders especially upper traps, levator, posture exercises, L shoulder ROM  [employer], PT 07/14/2024,  5:05 PM   1. Decompression Exercise     Cancer Rehab [phone]    Lie on back on firm surface, knees bent, feet flat, arms turned up, out to sides, backs of hands down. Time _5-15__ minutes. Surface: floor   2. Shoulder Press    Start in Decompression Exercise position. Press shoulders downward towards supporting surface. Hold __2-3__ seconds while counting out loud. Repeat _3-5___ times. Do _1-2___ times per day.   3. Head Press    Bring cervical spine (neck) into neutral position (by either tucking the chin towards the chest or tilting the chin upward). Feel weight on back of head. Press head downward into supporting surface.    Hold _2-3__ seconds. Repeat _3-5__ times. Do _1-2__ times per day.   4. Leg Lengthener    Straighten one leg. Pull toes AND forefoot toward knee, extend heel. Lengthen leg by pulling pelvis away from ribs. Hold _2-3__ seconds. Relax. Repeat __4-6__ times. Do other leg.  Surface: floor   5. Leg Press    Straighten one leg down to floor keeping leg aligned with hip. Pull toes AND forefoot toward knee; extend heel.  Press entire leg downward (as if pressing leg into sandy beach). DO NOT BEND KNEE. Hold _2-3__ seconds. Do __4-6__ times. Repeat with other leg.

## 2024-07-14 NOTE — Patient Instructions (Signed)
 Over Head Pull: Narrow and Wide Grip   [facility] Rehab [phone]   On back, knees bent, feet flat, band across thighs, elbows straight but relaxed. Pull hands apart (start). Keeping elbows straight, bring arms up and over head, hands toward floor. Keep pull steady on band. Hold momentarily. Return slowly, keeping pull steady, back to start. Then do same with a wider grip on the band (past shoulder width) Repeat _5-10__ times. Band color __yellow____   Side Pull: Double Arm   On back, knees bent, feet flat. Arms perpendicular to body, shoulder level, elbows straight but relaxed. Pull arms out to sides, elbows straight. Resistance band comes across collarbones, hands toward floor. Hold momentarily. Slowly return to starting position. Repeat _5-10__ times. Band color _yellow____   Sword   On back, knees bent, feet flat, left hand on left hip, right hand above left. Pull right arm DIAGONALLY (hip to shoulder) across chest. Bring right arm along head toward floor. Hold momentarily. Thumb pointed down when by hip and rotates upwards when by head. Slowly return to starting position. Repeat _5-10__ times. Do with left arm. Band color _yellow_____   Shoulder Rotation: Double Arm   On back, knees bent, feet flat, elbows tucked at sides, bent 90, hands palms up. Pull hands apart and down toward floor, keeping elbows near sides. Hold momentarily. Slowly return to starting position. Repeat _5-10__ times. Band color __yellow____

## 2024-07-20 ENCOUNTER — Other Ambulatory Visit: Payer: Self-pay

## 2024-07-20 ENCOUNTER — Ambulatory Visit

## 2024-07-20 DIAGNOSIS — C50412 Malignant neoplasm of upper-outer quadrant of left female breast: Secondary | ICD-10-CM

## 2024-07-20 DIAGNOSIS — R293 Abnormal posture: Secondary | ICD-10-CM

## 2024-07-20 DIAGNOSIS — M79602 Pain in left arm: Secondary | ICD-10-CM

## 2024-07-20 DIAGNOSIS — M25612 Stiffness of left shoulder, not elsewhere classified: Secondary | ICD-10-CM

## 2024-07-20 NOTE — Therapy (Signed)
 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   Family history of breast cancer 03/20/2022   Family history of pancreatic cancer 03/20/2022   HIV infection (HCC) 1993   Hypertension    Past Surgical History:  Procedure Laterality Date   ABDOMINAL HYSTERECTOMY     APPENDECTOMY     BREAST BIOPSY Right 2015   BREAST BIOPSY Left 08/26/2022   US  LT RADIOACTIVE SEED LOC 08/26/2022 [facility]   BREAST BIOPSY  08/26/2022   MM LT RADIOACTIVE SEED LOC MAMMO GUIDE 08/26/2022 [facility]   BREAST LUMPECTOMY WITH RADIOACTIVE SEED LOCALIZATION Left 08/27/2022   Procedure: LEFT BREAST LUMPECTOMY WITH RADIOACTIVE SEED LOCALIZATION;  Surgeon: Vernetta Berg, MD;  Location: [facility];  Service: General;  Laterality: Left;   CYSTOSCOPY/RETROGRADE/URETEROSCOPY Right 11/14/2016   Procedure: CYSTOSCOPY/RETROGRADE/URETEROSCOPY/ STONE EXTRACTION/HOLMIUM LASER/STENT PLACEMENT;  Surgeon: Mark Ottelin, MD;  Location: [facility];  Service: Urology;  Laterality: Right;   PORT-A-CATH REMOVAL Right 08/27/2022   Procedure: REMOVAL PORT-A-CATH;  Surgeon: Vernetta Berg, MD;  Location: [facility];  Service: General;  Laterality: Right;   PORTACATH PLACEMENT Right 03/26/2022   Procedure: INSERTION PORT-A-CATH;  Surgeon: Vernetta Berg, MD;  Location: [facility];  Service: General;  Laterality: Right;    RADIOACTIVE SEED GUIDED AXILLARY SENTINEL LYMPH NODE Left 08/27/2022   Procedure: RADIOACTIVE SEED GUIDED LEFT AXILLARY SENTINEL LYMPH NODE DISSECTION;  Surgeon: Vernetta Berg, MD;  Location:  S[facility]ERY [facility];  Service: General;  Laterality: Left;   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   HYPERTENSION, BENIGN 01/26/2007   SHINGLES, RECURRENT 12/08/2006   HIV disease (HCC) 10/26/2006   HX, PERSONAL, PENICILLIN ALLERGY 10/26/2006   HX, PERSONAL, DRUG ALLERGY NOS 10/26/2006    PCP: Raguel Blush, MD  REFERRING PROVIDER: Odean Potts, MD  REFERRING DIAG:  C50.412,Z17.0 (ICD-10-CM) - Malignant neoplasm of upper-outer quadrant of left breast in female, estrogen receptor positive (HCC)  THERAPY DIAG:  Pain in left arm  Stiffness of left shoulder, not elsewhere classified  Abnormal posture  Malignant neoplasm of upper-outer quadrant of left breast in female, estrogen receptor positive (HCC)  ONSET DATE: 06/14/24  Rationale for Evaluation and Treatment: Rehabilitation  SUBJECTIVE:  SUBJECTIVE STATEMENT: The tingling is getting better, it doesn't occur as often. The new theraband exs Blaire gave me last time are going well. I've been doing those.   PERTINENT HISTORY: Post left  lumpectomy 08/27/22.  3 of 4 lymph nodes positive. Neoadjuvant chemotherapy completed and radiation. It is ER positive, PR negative, and HER2 negative with a Ki67 of 10%. She has a positive axillary node. HIV  PAIN:  Are you having pain? No, not currently  PRECAUTIONS: Other: at risk of lymphedema  RED FLAGS: None   WEIGHT BEARING RESTRICTIONS: No  FALLS:  Has patient fallen in last 6 months? No  LIVING ENVIRONMENT: Lives with: brother Lives in: House/apartment Stairs: No;  Has following equipment at home: None  OCCUPATION: retired  LEISURE: active with her grandkids, does arm exercises, walks, leg exercises  HAND DOMINANCE: right   PRIOR LEVEL OF FUNCTION: Independent  PATIENT GOALS: to decrease the pain   OBJECTIVE: Note: Objective measures were completed at Evaluation unless otherwise noted.  COGNITION: Overall cognitive status: Within functional limits for tasks assessed   PALPATION: Increased muscle tightness noted in L upper traps, scalenes, lateral and posterior cervical muscles  OBSERVATIONS / OTHER ASSESSMENTS: very mild edema present at posterior wrist and 2nd digit on L  POSTURE: forward head, rounded shoulders  UPPER EXTREMITY AROM/PROM:  A/PROM RIGHT   eval   Shoulder extension 69  Shoulder flexion 154  Shoulder abduction 172  Shoulder internal rotation 51  Shoulder external rotation 97    (Blank rows = not tested)  A/PROM LEFT   eval  Shoulder extension 61  Shoulder flexion 141  Shoulder abduction 154  Shoulder internal rotation 64  Shoulder external rotation 85    (Blank rows = not tested)  CERVICAL AROM: All within normal limits:    Percent limited  Flexion WFL  Extension WFL  Right lateral flexion WFL  Left lateral flexion WFL  Right rotation WFL  Left rotation WFL    UPPER EXTREMITY STRENGTH: Shoulders 4/5  LYMPHEDEMA ASSESSMENTS:   SURGERY TYPE/DATE: L lumpectomy 08/27/22  NUMBER OF LYMPH NODES REMOVED: 3 of 4 nodes  positive  CHEMOTHERAPY: completed  RADIATION:completed  HORMONE TREATMENT: yes anastrozole   INFECTIONS: none   LYMPHEDEMA ASSESSMENTS:   LANDMARK RIGHT  eval  At axilla  31  15 cm proximal to the proximal aspect of the olecranon process 27.5  10 cm proximal to the proximal aspect of the olecranon process 25  Olecranon process 23  15 cm proximal to the proximal aspect of the ulnar styloid process 22  10 cm proximal to the proximal aspect of the ulnar styloid process 19.5  Just distal to the ulnar styloid process 15  Across hand at thumb web space 18  At base of 2nd digit 6.3  (Blank rows = not tested)  LANDMARK LEFT  eval  At axilla  30.5  15 cm proximal to the proximal aspect of the olecranon process 26.9  10 cm proximal to the proximal aspect of the olecranon process 25.5  Olecranon process 22.5  15 cm proximal to the proximal aspect of the ulnar styloid process 21.2  10 cm proximal to  the proximal aspect of the ulnar styloid process 18.5  Just distal to the ulnar styloid process 15.6  Across hand at thumb web space 18  At base of 2nd digit 6.3  (Blank rows = not tested)  Chest circumference just inferior to the axillae:  Chest circumference at the largest point:  L-DEX LYMPHEDEMA SCREENING: The patient was assessed using the L-Dex machine today to produce a lymphedema index baseline score. The patient will be reassessed on a regular basis (typically every 3 months) to obtain new L-Dex scores. If the score is > 6.5 points away from his/her baseline score indicating onset of subclinical lymphedema, it will be recommended to wear a compression garment for 4 weeks, 12 hours per day and then be reassessed. If the score continues to be > 6.5 points from baseline at reassessment, we will initiate lymphedema treatment. Assessing in this manner has a 95% rate of preventing clinically significant lymphedema.  QUICK DASH SURVEY:                                                                                                                                 TREATMENT DATE:  07/20/24: Therapeutic Exercises Pulleys into flex and then abd x 2 min each, pt reports  Roll yellow ball up wall into flex x 10, then Lt UE abd x 6 returning therapist demo Therapeutic Activities  Supine over half foam roll with 1 pillow as pt wanted to try again but did decrease reps: Bil horz abd x 5, bil UE scaption into a V x 5, then bil UE abd in a snow angel x 5; pt reports no pain today, just discomfort from new position of stretches on foam Supine on large mat table: Meeks Decompression x 5 reps, 5 sec holds. Pt able to lay with 1 pillow but did have tingling in the arm start with these, but probable due to new exs on foam roll.  Seated in chair with head against purple ball on wall for following: Cervical retraction x 10 reps with 5 sec holds reviewing proper technique as pt was performing thoracic ext with cervical retraction, then cervical retraction with holding 1# dumb bell: alternating flexion x 10, bilateral scaption x 10  Manual Therapy Gentle cervical traction with suboccipital release STM in supine to L SCM, upper traps, levator, paraspinals with cocoa butter P/ROM into cervical bil side bending and rotation with and without overpressure to clavicle for increased stretch  07/14/24: Therapeutic Exercises Pulleys into flex and then abd x 2 min each returning therapist demo and VC's to relax shoulder during  Roll yellow ball up wall into flex x 5 with feeling of stretching Therapeutic Activities  Supine on large mat table:  Meeks decompression exercises with 5 sec holds x 5 each - able to do without increased tingling in arm with only 1 pillow under head, started having tingling while showing pt supine scap so therapist added 2nd pillow which alleviated tingling, overhead narrow and wide grip flexion x 7 reps each, supine shoulder ER with yellow band x 7,  horizontal  abduction x 7, diagonals x 7 -with pt returning therapist demo, issued as part of HEP Seated in chair with head against purple ball on wall for following: Cervical retraction x 10  reps with 5 sec holds returning therapist demo, then cervical retraction with: alternating flexion x 10, bilateral scaption x 10  Manual Therapy Gentle cervical traction with suboccipital release STM in supine to L SCM, upper traps, levator, paraspinals   07/12/24: Therapeutic Exercises Pulleys into flex and then abd x 2 min each returning therapist demo and VC's to relax shoulder during  Roll yellow ball up wall into flex x 5 with feeling of stretching Therapeutic Activities  Supine on large mat table:  Meeks decompression exercises with 5 sec holds x 5 each - had to add pillow under head due to increased arm tingling and pain and this helped some - therapist also performed gentle STM to L shoulder/neck to help decrease discomfort, supine shoulder ER with yellow band x 7, supine alternating flexion x 7, supine scaption x 10  Seated in chair with head against purple ball on wall for following: Cervical retraction x 10 reps with 5 sec holds returning therapist demo, then cervical retraction with: alternating flexion x 10, bilateral scaption x 10  Manual Therapy Gentle cervical traction with suboccipital release STM in supine to L SCM, upper traps, levator, paraspinals and then to R side lying to the same and also rhomboids using cocoa butter and wave tool- pt reported relief after this      PATIENT EDUCATION:  Education details: Continued discussion of posture and added Meeks Decompression Exs  Person educated: Patient Education method: Explanation, demonstration, VC's and handout issued Education comprehension: verbalized understanding, returned demo and will benefit from further review  HOME EXERCISE PROGRAM: Access Code: [email or web address] URL: [email or web address] Date: 07/05/2024 Prepared by: Florina Lanis Carbon  Exercises - Seated Cervical Retraction  - 1 x daily - 7 x weekly - 1 sets - 10 reps - 3-5 sec hold - Seated Scapular Retraction  - 1 x daily - 7 x weekly - 1 sets - 10 reps - 3-5 sec hold  07/06/24 - Meeks Decompression Exs  07/14/24- supine scapular series  ASSESSMENT:  CLINICAL IMPRESSION: Pt has made adjustments with her ADLs to work on improving her posturing. She has also lessened the amount of time she spends doing her crossword puzzles to allow for stretch breaks. Resumed trial of half foam roll as pt wanted to try this since she has been feeling better. She reports started having tingling with last few reps of last ex on roll which caused this to continue with Meeks but she was also able to tolerate 1 pillow today which is progress. Overall pt is pleased with her progress thus far and reports her tingling resolved by end of session.   OBJECTIVE IMPAIRMENTS: decreased knowledge of condition, decreased strength, increased fascial restrictions, increased muscle spasms, impaired UE functional use, postural dysfunction, and pain.   ACTIVITY LIMITATIONS: carrying, lifting, and reach over head  PARTICIPATION LIMITATIONS: cleaning and laundry  PERSONAL FACTORS: Past/current experiences hx of radiation are also affecting patient's functional outcome.   REHAB POTENTIAL: Good  CLINICAL DECISION MAKING: Stable/uncomplicated  EVALUATION COMPLEXITY: Low  GOALS: Goals reviewed with patient? Yes   LONG TERM GOALS=SHORT TERM GOALS Target date: 08/02/24  Pt will report a 75% improvement in pain in LUE to allow pt improved comfort and function. Baseline:  Goal status: INITIAL  2.  Pt will demonstrate 150 degrees of L shoulder flexion to allow her to reach up overhead. Baseline:  Goal status: INITIAL  3.  Pt will demonstrate 160 degrees of L shoulder abduction to allow her to reach to the  side. Baseline:  Goal status: INITIAL  4.  Pt will be independent in a home  exercise program to continue to improve posture and postural awareness. Baseline:  Goal status: INITIAL  PLAN:  PT FREQUENCY: 2x/week  PT DURATION: 4 weeks  PLANNED INTERVENTIONS: 97164- PT Re-evaluation, 97110-Therapeutic exercises, 97530- Therapeutic activity, 97112- Neuromuscular re-education, 97535- Self Care, [id number]- Manual therapy, [phone]- Orthotic Initial, [phone]- Orthotic/Prosthetic subsequent, Patient/Family education, Balance training, Joint mobilization, Therapeutic exercises, Therapeutic activity, Neuromuscular re-education, Gait training, and Self Care  PLAN FOR NEXT SESSION: Cont postural stability finding exercises that decrease Lt UE symptoms; P/ROM and STM to L neck and shoulders especially upper traps, levator, posture exercises, L shoulder ROM  Aden Berwyn Caldron, PTA 07/20/2024, 1:03 PM   1. Decompression Exercise     Cancer Rehab [phone]    Lie on back on firm surface, knees bent, feet flat, arms turned up, out to sides, backs of hands down. Time _5-15__ minutes. Surface: floor   2. Shoulder Press    Start in Decompression Exercise position. Press shoulders downward towards supporting surface. Hold __2-3__ seconds while counting out loud. Repeat _3-5___ times. Do _1-2___ times per day.   3. Head Press    Bring cervical spine (neck) into neutral position (by either tucking the chin towards the chest or tilting the chin upward). Feel weight on back of head. Press head downward into supporting surface.    Hold _2-3__ seconds. Repeat _3-5__ times. Do _1-2__ times per day.   4. Leg Lengthener    Straighten one leg. Pull toes AND forefoot toward knee, extend heel. Lengthen leg by pulling pelvis away from ribs. Hold _2-3__ seconds. Relax. Repeat __4-6__ times. Do other leg.  Surface: floor   5. Leg Press    Straighten one leg down to floor keeping leg aligned with hip. Pull toes AND forefoot toward knee; extend heel.  Press entire leg downward (as  if pressing leg into sandy beach). DO NOT BEND KNEE. Hold _2-3__ seconds. Do __4-6__ times. Repeat with other leg.  "

## 2024-07-22 ENCOUNTER — Other Ambulatory Visit (HOSPITAL_COMMUNITY): Payer: Self-pay

## 2024-07-25 ENCOUNTER — Other Ambulatory Visit (HOSPITAL_COMMUNITY): Payer: Self-pay

## 2024-07-25 ENCOUNTER — Other Ambulatory Visit: Payer: Self-pay

## 2024-07-25 NOTE — Progress Notes (Signed)
 Specialty Pharmacy Refill Coordination Note  Veronica Robles is a 68 y.o. female contacted today regarding refills of specialty medication(s) Emtricitabine -Tenofovir  AF (Descovy ); Fostemsavir Tromethamine  (Rukobia )   Patient requested Pickup at [facility] [place] date: 07/27/24   Medication will be filled on: 07/26/24

## 2024-07-26 ENCOUNTER — Other Ambulatory Visit: Payer: Self-pay

## 2024-07-26 ENCOUNTER — Encounter: Payer: Self-pay | Admitting: Physical Therapy

## 2024-07-26 ENCOUNTER — Ambulatory Visit: Admitting: Physical Therapy

## 2024-07-26 DIAGNOSIS — M25612 Stiffness of left shoulder, not elsewhere classified: Secondary | ICD-10-CM

## 2024-07-26 DIAGNOSIS — Z170 Estrogen receptor positive status [ER+]: Secondary | ICD-10-CM

## 2024-07-26 DIAGNOSIS — M79602 Pain in left arm: Secondary | ICD-10-CM | POA: Diagnosis not present

## 2024-07-26 DIAGNOSIS — R293 Abnormal posture: Secondary | ICD-10-CM

## 2024-07-26 NOTE — Therapy (Signed)
 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   Family history of breast cancer 03/20/2022   Family history of pancreatic cancer 03/20/2022   HIV infection (HCC) 1993   Hypertension    Past Surgical History:  Procedure Laterality Date   ABDOMINAL HYSTERECTOMY     APPENDECTOMY     BREAST BIOPSY Right 2015   BREAST BIOPSY Left 08/26/2022   US  LT RADIOACTIVE SEED LOC 08/26/2022 [facility]   BREAST BIOPSY  08/26/2022   MM LT RADIOACTIVE SEED LOC MAMMO GUIDE 08/26/2022 [facility]   BREAST LUMPECTOMY WITH RADIOACTIVE SEED LOCALIZATION Left 08/27/2022   Procedure: LEFT BREAST LUMPECTOMY WITH RADIOACTIVE SEED LOCALIZATION;  Surgeon: Vernetta Berg, MD;  Location: [facility];  Service: General;  Laterality: Left;   CYSTOSCOPY/RETROGRADE/URETEROSCOPY Right 11/14/2016   Procedure: CYSTOSCOPY/RETROGRADE/URETEROSCOPY/ STONE EXTRACTION/HOLMIUM LASER/STENT PLACEMENT;  Surgeon: Mark Ottelin, MD;  Location: [facility];  Service: Urology;  Laterality: Right;   PORT-A-CATH REMOVAL Right 08/27/2022   Procedure: REMOVAL PORT-A-CATH;  Surgeon: Vernetta Berg, MD;  Location: [facility];  Service: General;  Laterality: Right;   PORTACATH PLACEMENT Right 03/26/2022   Procedure: INSERTION PORT-A-CATH;  Surgeon: Vernetta Berg, MD;  Location: [facility];  Service: General;  Laterality: Right;    RADIOACTIVE SEED GUIDED AXILLARY SENTINEL LYMPH NODE Left 08/27/2022   Procedure: RADIOACTIVE SEED GUIDED LEFT AXILLARY SENTINEL LYMPH NODE DISSECTION;  Surgeon: Vernetta Berg, MD;  Location: [facility];  Service: General;  Laterality: Left;   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   HYPERTENSION, BENIGN 01/26/2007   SHINGLES, RECURRENT 12/08/2006   HIV disease (HCC) 10/26/2006   HX, PERSONAL, PENICILLIN ALLERGY 10/26/2006   HX, PERSONAL, DRUG ALLERGY NOS 10/26/2006    PCP: Raguel Blush, MD  REFERRING PROVIDER: Odean Potts, MD  REFERRING DIAG:  C50.412,Z17.0 (ICD-10-CM) - Malignant neoplasm of upper-outer quadrant of left breast in female, estrogen receptor positive (HCC)  THERAPY DIAG:  Pain in left arm  Stiffness of left shoulder, not elsewhere classified  Abnormal posture  Malignant neoplasm of upper-outer quadrant of left breast in female, estrogen receptor positive (HCC)  ONSET DATE: 06/14/24  Rationale for Evaluation and Treatment: Rehabilitation  SUBJECTIVE:  SUBJECTIVE STATEMENT: I am doing some better.   PERTINENT HISTORY: Post left lumpectomy 08/27/22.  3 of 4 lymph nodes positive. Neoadjuvant chemotherapy completed and radiation. It is ER positive, PR  negative, and HER2 negative with a Ki67 of 10%. She has a positive axillary node. HIV  PAIN:  Are you having pain? No, not currently  PRECAUTIONS: Other: at risk of lymphedema  RED FLAGS: None   WEIGHT BEARING RESTRICTIONS: No  FALLS:  Has patient fallen in last 6 months? No  LIVING ENVIRONMENT: Lives with: brother Lives in: House/apartment Stairs: No;  Has following equipment at home: None  OCCUPATION: retired  LEISURE: active with her grandkids, does arm exercises, walks, leg exercises  HAND DOMINANCE: right   PRIOR LEVEL OF FUNCTION: Independent  PATIENT GOALS: to decrease the pain   OBJECTIVE: Note: Objective measures were completed at Evaluation unless otherwise noted.  COGNITION: Overall cognitive status: Within functional limits for tasks assessed   PALPATION: Increased muscle tightness noted in L upper traps, scalenes, lateral and posterior cervical muscles  OBSERVATIONS / OTHER ASSESSMENTS: very mild edema present at posterior wrist and 2nd digit on L  POSTURE: forward head, rounded shoulders  UPPER EXTREMITY AROM/PROM:  A/PROM RIGHT   eval   Shoulder extension 69  Shoulder flexion 154  Shoulder abduction 172  Shoulder internal rotation 51  Shoulder external rotation 97    (Blank rows = not tested)  A/PROM LEFT   eval  Shoulder extension 61  Shoulder flexion 141  Shoulder abduction 154  Shoulder internal rotation 64  Shoulder external rotation 85    (Blank rows = not tested)  CERVICAL AROM: All within normal limits:    Percent limited  Flexion WFL  Extension WFL  Right lateral flexion WFL  Left lateral flexion WFL  Right rotation WFL  Left rotation WFL    UPPER EXTREMITY STRENGTH: Shoulders 4/5  LYMPHEDEMA ASSESSMENTS:   SURGERY TYPE/DATE: L lumpectomy 08/27/22  NUMBER OF LYMPH NODES REMOVED: 3 of 4 nodes positive  CHEMOTHERAPY: completed  RADIATION:completed  HORMONE TREATMENT: yes anastrozole   INFECTIONS:  none   LYMPHEDEMA ASSESSMENTS:   LANDMARK RIGHT  eval  At axilla  31  15 cm proximal to the proximal aspect of the olecranon process 27.5  10 cm proximal to the proximal aspect of the olecranon process 25  Olecranon process 23  15 cm proximal to the proximal aspect of the ulnar styloid process 22  10 cm proximal to the proximal aspect of the ulnar styloid process 19.5  Just distal to the ulnar styloid process 15  Across hand at thumb web space 18  At base of 2nd digit 6.3  (Blank rows = not tested)  LANDMARK LEFT  eval  At axilla  30.5  15 cm proximal to the proximal aspect of the olecranon process 26.9  10 cm proximal to the proximal aspect of the olecranon process 25.5  Olecranon process 22.5  15 cm proximal to the proximal aspect of the ulnar styloid process 21.2  10 cm proximal to  the proximal aspect of the ulnar styloid process 18.5  Just distal to the ulnar styloid process 15.6  Across hand at thumb web space 18  At base of 2nd digit 6.3  (Blank rows = not tested)  Chest circumference just inferior to the axillae:  Chest circumference at the largest point:         L-DEX LYMPHEDEMA SCREENING: The patient was assessed using the L-Dex machine today to produce a lymphedema index  baseline score. The patient will be reassessed on a regular basis (typically every 3 months) to obtain new L-Dex scores. If the score is > 6.5 points away from his/her baseline score indicating onset of subclinical lymphedema, it will be recommended to wear a compression garment for 4 weeks, 12 hours per day and then be reassessed. If the score continues to be > 6.5 points from baseline at reassessment, we will initiate lymphedema treatment. Assessing in this manner has a 95% rate of preventing clinically significant lymphedema.  QUICK DASH SURVEY:                                                                                                                                TREATMENT DATE:   07/26/24: Therapeutic Exercises Pulleys into flex and then abd x 2 min each, pt reports  Roll yellow ball up wall into flex x 10, then Lt UE abd x 6 returning therapist demo Therapeutic Activities  Supine over half foam roll with 1 pillow as pt wanted to try again but did decrease reps: Bil horz abd x 5, bil UE scaption into a V x 5, then bil UE abd in a snow angel x 5; pt reports no pain today Supine on large mat table: Meeks Decompression x 5 reps, 5 sec holds. Pt able to lay with 1 pillow but did have some tingling in the arm  Seated in chair with head against purple ball on wall for following: Cervical retraction x 10 reps with 5 sec holds reviewing proper technique again as pt was performing thoracic ext with cervical retraction, then cervical retraction with holding 1# dumb bell: alternating flexion x 10, bilateral scaption x 10  Manual Therapy Gentle cervical traction with suboccipital release STM in supine to L SCM, upper traps, levator, paraspinals and edge of lat where there is a larger trigger point   07/20/24: Therapeutic Exercises Pulleys into flex and then abd x 2 min each, pt reports  Roll yellow ball up wall into flex x 10, then Lt UE abd x 6 returning therapist demo Therapeutic Activities  Supine over half foam roll with 1 pillow as pt wanted to try again but did decrease reps: Bil horz abd x 5, bil UE scaption into a V x 5, then bil UE abd in a snow angel x 5; pt reports no pain today, just discomfort from new position of stretches on foam Supine on large mat table: Meeks Decompression x 5 reps, 5 sec holds. Pt able to lay with 1 pillow but did have tingling in the arm start with these, but probable due to new exs on foam roll.  Seated in chair with head against purple ball on wall for following: Cervical retraction x 10 reps with 5 sec holds reviewing proper technique as pt was performing thoracic ext with cervical retraction, then cervical retraction with holding 1#  dumb bell: alternating flexion x 10, bilateral scaption x 10  Manual Therapy Gentle cervical traction with suboccipital release STM in supine to L SCM, upper traps, levator, paraspinals with cocoa butter P/ROM into cervical bil side bending and rotation with and without overpressure to clavicle for increased stretch  07/14/24: Therapeutic Exercises Pulleys into flex and then abd x 2 min each returning therapist demo and VC's to relax shoulder during  Roll yellow ball up wall into flex x 5 with feeling of stretching Therapeutic Activities  Supine on large mat table:  Meeks decompression exercises with 5 sec holds x 5 each - able to do without increased tingling in arm with only 1 pillow under head, started having tingling while showing pt supine scap so therapist added 2nd pillow which alleviated tingling, overhead narrow and wide grip flexion x 7 reps each, supine shoulder ER with yellow band x 7,  horizontal abduction x 7, diagonals x 7 -with pt returning therapist demo, issued as part of HEP Seated in chair with head against purple ball on wall for following: Cervical retraction x 10 reps with 5 sec holds returning therapist demo, then cervical retraction with: alternating flexion x 10, bilateral scaption x 10  Manual Therapy Gentle cervical traction with suboccipital release STM in supine to L SCM, upper traps, levator, paraspinals   07/12/24: Therapeutic Exercises Pulleys into flex and then abd x 2 min each returning therapist demo and VC's to relax shoulder during  Roll yellow ball up wall into flex x 5 with feeling of stretching Therapeutic Activities  Supine on large mat table:  Meeks decompression exercises with 5 sec holds x 5 each - had to add pillow under head due to increased arm tingling and pain and this helped some - therapist also performed gentle STM to L shoulder/neck to help decrease discomfort, supine shoulder ER with yellow band x 7, supine alternating flexion x 7, supine  scaption x 10  Seated in chair with head against purple ball on wall for following: Cervical retraction x 10 reps with 5 sec holds returning therapist demo, then cervical retraction with: alternating flexion x 10, bilateral scaption x 10  Manual Therapy Gentle cervical traction with suboccipital release STM in supine to L SCM, upper traps, levator, paraspinals and then to R side lying to the same and also rhomboids using cocoa butter and wave tool- pt reported relief after this      PATIENT EDUCATION:  Education details: Continued discussion of posture and added Meeks Decompression Exs  Person educated: Patient Education method: Explanation, demonstration, VC's and handout issued Education comprehension: verbalized understanding, returned demo and will benefit from further review  HOME EXERCISE PROGRAM: Access Code: [email or web address] URL: [email or web address] Date: 07/05/2024 Prepared by: Florina Lanis Carbon  Exercises - Seated Cervical Retraction  - 1 x daily - 7 x weekly - 1 sets - 10 reps - 3-5 sec hold - Seated Scapular Retraction  - 1 x daily - 7 x weekly - 1 sets - 10 reps - 3-5 sec hold  07/06/24 - Meeks Decompression Exs  07/14/24- supine scapular series  ASSESSMENT:  CLINICAL IMPRESSION: Pt able to tolerate half foam roll again but does feel a strong stretch with this due to her rounded posture. She was able to lie down with only 1 pillow and did have some tingling but not enough to require another pillow. She has a large trigger point on edge of lats on the L side. Will continue to work on this next session.    OBJECTIVE IMPAIRMENTS: decreased knowledge of condition, decreased strength,  increased fascial restrictions, increased muscle spasms, impaired UE functional use, postural dysfunction, and pain.   ACTIVITY LIMITATIONS: carrying, lifting, and reach over head  PARTICIPATION LIMITATIONS: cleaning and laundry  PERSONAL FACTORS: Past/current experiences hx  of radiation are also affecting patient's functional outcome.   REHAB POTENTIAL: Good  CLINICAL DECISION MAKING: Stable/uncomplicated  EVALUATION COMPLEXITY: Low  GOALS: Goals reviewed with patient? Yes   LONG TERM GOALS=SHORT TERM GOALS Target date: 08/02/24  Pt will report a 75% improvement in pain in LUE to allow pt improved comfort and function. Baseline:  Goal status: INITIAL  2.  Pt will demonstrate 150 degrees of L shoulder flexion to allow her to reach up overhead. Baseline:  Goal status: INITIAL  3.  Pt will demonstrate 160 degrees of L shoulder abduction to allow her to reach to the side. Baseline:  Goal status: INITIAL  4.  Pt will be independent in a home exercise program to continue to improve posture and postural awareness. Baseline:  Goal status: INITIAL  PLAN:  PT FREQUENCY: 2x/week  PT DURATION: 4 weeks  PLANNED INTERVENTIONS: [id number]- PT Re-evaluation, 97110-Therapeutic exercises, 97530- Therapeutic activity, 97112- Neuromuscular re-education, 97535- Self Care, [id number]- Manual therapy, [phone]- Orthotic Initial, [phone]- Orthotic/Prosthetic subsequent, Patient/Family education, Balance training, Joint mobilization, Therapeutic exercises, Therapeutic activity, Neuromuscular re-education, Gait training, and Self Care  PLAN FOR NEXT SESSION: MFR/STM to lateral edge of lat, Cont postural stability finding exercises that decrease Lt UE symptoms; P/ROM and STM to L neck and shoulders especially upper traps, levator, posture exercises, L shoulder ROM  Sameerah Nachtigal Breedlove Blue, PT 07/26/2024, 4:00 PM   1. Decompression Exercise     Cancer Rehab [phone]    Lie on back on firm surface, knees bent, feet flat, arms turned up, out to sides, backs of hands down. Time _5-15__ minutes. Surface: floor   2. Shoulder Press    Start in Decompression Exercise position. Press shoulders downward towards supporting surface. Hold __2-3__ seconds while counting out loud. Repeat  _3-5___ times. Do _1-2___ times per day.   3. Head Press    Bring cervical spine (neck) into neutral position (by either tucking the chin towards the chest or tilting the chin upward). Feel weight on back of head. Press head downward into supporting surface.    Hold _2-3__ seconds. Repeat _3-5__ times. Do _1-2__ times per day.   4. Leg Lengthener    Straighten one leg. Pull toes AND forefoot toward knee, extend heel. Lengthen leg by pulling pelvis away from ribs. Hold _2-3__ seconds. Relax. Repeat __4-6__ times. Do other leg.  Surface: floor   5. Leg Press    Straighten one leg down to floor keeping leg aligned with hip. Pull toes AND forefoot toward knee; extend heel.  Press entire leg downward (as if pressing leg into sandy beach). DO NOT BEND KNEE. Hold _2-3__ seconds. Do __4-6__ times. Repeat with other leg.  "

## 2024-07-29 ENCOUNTER — Encounter: Payer: Self-pay | Admitting: Physical Therapy

## 2024-07-29 ENCOUNTER — Ambulatory Visit: Attending: Surgery | Admitting: Physical Therapy

## 2024-07-29 DIAGNOSIS — R293 Abnormal posture: Secondary | ICD-10-CM | POA: Insufficient documentation

## 2024-07-29 DIAGNOSIS — C50412 Malignant neoplasm of upper-outer quadrant of left female breast: Secondary | ICD-10-CM | POA: Diagnosis present

## 2024-07-29 DIAGNOSIS — M79602 Pain in left arm: Secondary | ICD-10-CM | POA: Insufficient documentation

## 2024-07-29 DIAGNOSIS — M25612 Stiffness of left shoulder, not elsewhere classified: Secondary | ICD-10-CM | POA: Insufficient documentation

## 2024-07-29 DIAGNOSIS — Z170 Estrogen receptor positive status [ER+]: Secondary | ICD-10-CM | POA: Insufficient documentation

## 2024-07-29 DIAGNOSIS — Z483 Aftercare following surgery for neoplasm: Secondary | ICD-10-CM | POA: Diagnosis present

## 2024-07-29 NOTE — Therapy (Signed)
 " OUTPATIENT PHYSICAL THERAPY  UPPER EXTREMITY ONCOLOGY TREATMENT  Patient Name: Veronica Robles MRN: [id number] DOB:03/31/1956, 69 y.o., female Today's Date: 07/29/2024  END OF SESSION:  PT End of Session - 07/29/24 1155     Visit Number 7    Number of Visits 9    Date for Recertification  08/02/24    Authorization Type None needed    PT Start Time 1102    PT Stop Time 1155    PT Time Calculation (min) 53 min    Activity Tolerance Patient tolerated treatment well    Behavior During Therapy 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   Family history of breast cancer 03/20/2022   Family history of pancreatic cancer 03/20/2022   HIV infection (HCC) 1993   Hypertension    Past Surgical History:  Procedure Laterality Date   ABDOMINAL HYSTERECTOMY     APPENDECTOMY     BREAST BIOPSY Right 2015   BREAST BIOPSY Left 08/26/2022   US  LT RADIOACTIVE SEED LOC 08/26/2022 [facility]   BREAST BIOPSY  08/26/2022   MM LT RADIOACTIVE SEED LOC MAMMO GUIDE 08/26/2022 [facility]   BREAST LUMPECTOMY WITH RADIOACTIVE SEED LOCALIZATION Left 08/27/2022   Procedure: LEFT BREAST LUMPECTOMY WITH RADIOACTIVE SEED LOCALIZATION;  Surgeon: Vernetta Berg, MD;  Location: [facility];  Service: General;  Laterality: Left;   CYSTOSCOPY/RETROGRADE/URETEROSCOPY Right 11/14/2016   Procedure: CYSTOSCOPY/RETROGRADE/URETEROSCOPY/ STONE EXTRACTION/HOLMIUM LASER/STENT PLACEMENT;  Surgeon: Mark Ottelin, MD;  Location: [facility];  Service: Urology;  Laterality: Right;   PORT-A-CATH REMOVAL Right 08/27/2022   Procedure: REMOVAL PORT-A-CATH;  Surgeon: Vernetta Berg, MD;  Location: [facility];  Service: General;  Laterality: Right;   PORTACATH PLACEMENT Right 03/26/2022   Procedure: INSERTION PORT-A-CATH;  Surgeon: Vernetta Berg, MD;  Location: [facility];   Service: General;  Laterality: Right;   RADIOACTIVE SEED GUIDED AXILLARY SENTINEL LYMPH NODE Left 08/27/2022   Procedure: RADIOACTIVE SEED GUIDED LEFT AXILLARY SENTINEL LYMPH NODE DISSECTION;  Surgeon: Vernetta Berg, MD;  Location: [facility];  Service: General;  Laterality: Left;   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   HYPERTENSION, BENIGN 01/26/2007   SHINGLES, RECURRENT 12/08/2006   HIV disease (HCC) 10/26/2006   HX, PERSONAL, PENICILLIN ALLERGY 10/26/2006   HX, PERSONAL, DRUG ALLERGY NOS 10/26/2006    PCP: Raguel Blush, MD  REFERRING PROVIDER: Odean Potts, MD  REFERRING DIAG:  C50.412,Z17.0 (ICD-10-CM) - Malignant neoplasm of upper-outer quadrant of left breast in female, estrogen receptor positive (HCC)  THERAPY DIAG:  Pain in left arm  Stiffness of left shoulder, not elsewhere classified  Abnormal posture  Malignant neoplasm of upper-outer quadrant of left breast in female, estrogen receptor positive (HCC)  ONSET DATE: 06/14/24  Rationale for Evaluation and Treatment: Rehabilitation  SUBJECTIVE:  SUBJECTIVE STATEMENT: The tingling is improving but the pain down my arms still comes and goes.   PERTINENT HISTORY: Post left lumpectomy 08/27/22.  3 of 4 lymph nodes  positive. Neoadjuvant chemotherapy completed and radiation. It is ER positive, PR negative, and HER2 negative with a Ki67 of 10%. She has a positive axillary node. HIV  PAIN:  Are you having pain? 6/10 down L arm, intermittent, certain positions make it worse  PRECAUTIONS: Other: at risk of lymphedema  RED FLAGS: None   WEIGHT BEARING RESTRICTIONS: No  FALLS:  Has patient fallen in last 6 months? No  LIVING ENVIRONMENT: Lives with: brother Lives in: House/apartment Stairs: No;  Has following equipment at home: None  OCCUPATION: retired  LEISURE: active with her grandkids, does arm exercises, walks, leg exercises  HAND DOMINANCE: right   PRIOR LEVEL OF FUNCTION: Independent  PATIENT GOALS: to decrease the pain   OBJECTIVE: Note: Objective measures were completed at Evaluation unless otherwise noted.  COGNITION: Overall cognitive status: Within functional limits for tasks assessed   PALPATION: Increased muscle tightness noted in L upper traps, scalenes, lateral and posterior cervical muscles  OBSERVATIONS / OTHER ASSESSMENTS: very mild edema present at posterior wrist and 2nd digit on L  POSTURE: forward head, rounded shoulders  UPPER EXTREMITY AROM/PROM:  A/PROM RIGHT   eval   Shoulder extension 69  Shoulder flexion 154  Shoulder abduction 172  Shoulder internal rotation 51  Shoulder external rotation 97    (Blank rows = not tested)  A/PROM LEFT   eval  Shoulder extension 61  Shoulder flexion 141  Shoulder abduction 154  Shoulder internal rotation 64  Shoulder external rotation 85    (Blank rows = not tested)  CERVICAL AROM: All within normal limits:    Percent limited  Flexion WFL  Extension WFL  Right lateral flexion WFL  Left lateral flexion WFL  Right rotation WFL  Left rotation WFL    UPPER EXTREMITY STRENGTH: Shoulders 4/5  LYMPHEDEMA ASSESSMENTS:   SURGERY TYPE/DATE: L lumpectomy 08/27/22  NUMBER OF LYMPH NODES REMOVED: 3 of 4  nodes positive  CHEMOTHERAPY: completed  RADIATION:completed  HORMONE TREATMENT: yes anastrozole   INFECTIONS: none   LYMPHEDEMA ASSESSMENTS:   LANDMARK RIGHT  eval  At axilla  31  15 cm proximal to the proximal aspect of the olecranon process 27.5  10 cm proximal to the proximal aspect of the olecranon process 25  Olecranon process 23  15 cm proximal to the proximal aspect of the ulnar styloid process 22  10 cm proximal to the proximal aspect of the ulnar styloid process 19.5  Just distal to the ulnar styloid process 15  Across hand at thumb web space 18  At base of 2nd digit 6.3  (Blank rows = not tested)  LANDMARK LEFT  eval  At axilla  30.5  15 cm proximal to the proximal aspect of the olecranon process 26.9  10 cm proximal to the proximal aspect of the olecranon process 25.5  Olecranon process 22.5  15 cm proximal to the proximal aspect of the ulnar styloid process 21.2  10 cm proximal to  the proximal aspect of the ulnar styloid process 18.5  Just distal to the ulnar styloid process 15.6  Across hand at thumb web space 18  At base of 2nd digit 6.3  (Blank rows = not tested)  Chest circumference just inferior to the axillae:  Chest circumference at the largest point:         L-DEX  LYMPHEDEMA SCREENING: The patient was assessed using the L-Dex machine today to produce a lymphedema index baseline score. The patient will be reassessed on a regular basis (typically every 3 months) to obtain new L-Dex scores. If the score is > 6.5 points away from his/her baseline score indicating onset of subclinical lymphedema, it will be recommended to wear a compression garment for 4 weeks, 12 hours per day and then be reassessed. If the score continues to be > 6.5 points from baseline at reassessment, we will initiate lymphedema treatment. Assessing in this manner has a 95% rate of preventing clinically significant lymphedema.  QUICK DASH SURVEY:                                                                                                                                 TREATMENT DATE:  07/29/24: Therapeutic Exercises Pulleys into flex and then abd x 2 min each, pt reports  Roll yellow ball up wall into flex x 10, then Lt UE abd x 6 returning therapist demo Therapeutic Activities  Supine over half foam roll with 1 pillow but removed 1/2 way throughout due to discomfort in back: supine scapular series with red band x 10 reps as follows with pt returning therapist demo: narrow and wide grip flexion, horizontal abduction, diagonals, and ER Supine on large mat table: Meeks Decompression x 5 reps, 5 sec holds. Pt able to lay with 1 pillow but did have some tingling in the arm  Manual Therapy In R sidelying: using cocoa butter STM to L serratus and edge of lats where pt has trigger points with decreased muscle tension noted by end of session  07/26/24: Therapeutic Exercises Pulleys into flex and then abd x 2 min each, pt reports  Roll yellow ball up wall into flex x 10, then Lt UE abd x 6 returning therapist demo Therapeutic Activities  Supine over half foam roll with 1 pillow as pt wanted to try again but did decrease reps: Bil horz abd x 5, bil UE scaption into a V x 5, then bil UE abd in a snow angel x 5; pt reports no pain today Supine on large mat table: Meeks Decompression x 5 reps, 5 sec holds. Pt able to lay with 1 pillow but did have some tingling in the arm  Seated in chair with head against purple ball on wall for following: Cervical retraction x 10 reps with 5 sec holds reviewing proper technique again as pt was performing thoracic ext with cervical retraction, then cervical retraction with holding 1# dumb bell: alternating flexion x 10, bilateral scaption x 10  Manual Therapy Gentle cervical traction with suboccipital release STM in supine to L SCM, upper traps, levator, paraspinals and edge of lat where there is a larger trigger  point   07/20/24: Therapeutic Exercises Pulleys into flex and then abd x 2 min each, pt reports  Roll yellow ball up wall into flex x 10,  then Lt UE abd x 6 returning therapist demo Therapeutic Activities  Supine over half foam roll with 1 pillow as pt wanted to try again but did decrease reps: Bil horz abd x 5, bil UE scaption into a V x 5, then bil UE abd in a snow angel x 5; pt reports no pain today, just discomfort from new position of stretches on foam Supine on large mat table: Meeks Decompression x 5 reps, 5 sec holds. Pt able to lay with 1 pillow but did have tingling in the arm start with these, but probable due to new exs on foam roll.  Seated in chair with head against purple ball on wall for following: Cervical retraction x 10 reps with 5 sec holds reviewing proper technique as pt was performing thoracic ext with cervical retraction, then cervical retraction with holding 1# dumb bell: alternating flexion x 10, bilateral scaption x 10  Manual Therapy Gentle cervical traction with suboccipital release STM in supine to L SCM, upper traps, levator, paraspinals with cocoa butter P/ROM into cervical bil side bending and rotation with and without overpressure to clavicle for increased stretch  07/14/24: Therapeutic Exercises Pulleys into flex and then abd x 2 min each returning therapist demo and VC's to relax shoulder during  Roll yellow ball up wall into flex x 5 with feeling of stretching Therapeutic Activities  Supine on large mat table:  Meeks decompression exercises with 5 sec holds x 5 each - able to do without increased tingling in arm with only 1 pillow under head, started having tingling while showing pt supine scap so therapist added 2nd pillow which alleviated tingling, overhead narrow and wide grip flexion x 7 reps each, supine shoulder ER with yellow band x 7,  horizontal abduction x 7, diagonals x 7 -with pt returning therapist demo, issued as part of HEP Seated in chair  with head against purple ball on wall for following: Cervical retraction x 10 reps with 5 sec holds returning therapist demo, then cervical retraction with: alternating flexion x 10, bilateral scaption x 10  Manual Therapy Gentle cervical traction with suboccipital release STM in supine to L SCM, upper traps, levator, paraspinals   07/12/24: Therapeutic Exercises Pulleys into flex and then abd x 2 min each returning therapist demo and VC's to relax shoulder during  Roll yellow ball up wall into flex x 5 with feeling of stretching Therapeutic Activities  Supine on large mat table:  Meeks decompression exercises with 5 sec holds x 5 each - had to add pillow under head due to increased arm tingling and pain and this helped some - therapist also performed gentle STM to L shoulder/neck to help decrease discomfort, supine shoulder ER with yellow band x 7, supine alternating flexion x 7, supine scaption x 10  Seated in chair with head against purple ball on wall for following: Cervical retraction x 10 reps with 5 sec holds returning therapist demo, then cervical retraction with: alternating flexion x 10, bilateral scaption x 10  Manual Therapy Gentle cervical traction with suboccipital release STM in supine to L SCM, upper traps, levator, paraspinals and then to R side lying to the same and also rhomboids using cocoa butter and wave tool- pt reported relief after this      PATIENT EDUCATION:  Education details: Continued discussion of posture and added Meeks Decompression Exs  Person educated: Patient Education method: Explanation, demonstration, VC's and handout issued Education comprehension: verbalized understanding, returned demo and will benefit from further  review  HOME EXERCISE PROGRAM: Access Code: [email or web address] URL: [email or web address] Date: 07/05/2024 Prepared by: Florina Lanis Carbon  Exercises - Seated Cervical Retraction  - 1 x daily - 7 x weekly - 1 sets - 10 reps  - 3-5 sec hold - Seated Scapular Retraction  - 1 x daily - 7 x weekly - 1 sets - 10 reps - 3-5 sec hold  07/06/24 - Meeks Decompression Exs  07/14/24- supine scapular series  ASSESSMENT:  CLINICAL IMPRESSION: Added supine scapular series with red band on 1/2 foam roll but had to remove roll due to back discomfort. Pt returned therapist demo for each. Focused on manual therapy to trigger points at serratus and lateral edge of lats with decreased muscle tension noted by end of session.    OBJECTIVE IMPAIRMENTS: decreased knowledge of condition, decreased strength, increased fascial restrictions, increased muscle spasms, impaired UE functional use, postural dysfunction, and pain.   ACTIVITY LIMITATIONS: carrying, lifting, and reach over head  PARTICIPATION LIMITATIONS: cleaning and laundry  PERSONAL FACTORS: Past/current experiences hx of radiation are also affecting patient's functional outcome.   REHAB POTENTIAL: Good  CLINICAL DECISION MAKING: Stable/uncomplicated  EVALUATION COMPLEXITY: Low  GOALS: Goals reviewed with patient? Yes   LONG TERM GOALS=SHORT TERM GOALS Target date: 08/02/24  Pt will report a 75% improvement in pain in LUE to allow pt improved comfort and function. Baseline:  Goal status: INITIAL  2.  Pt will demonstrate 150 degrees of L shoulder flexion to allow her to reach up overhead. Baseline:  Goal status: INITIAL  3.  Pt will demonstrate 160 degrees of L shoulder abduction to allow her to reach to the side. Baseline:  Goal status: INITIAL  4.  Pt will be independent in a home exercise program to continue to improve posture and postural awareness. Baseline:  Goal status: INITIAL  PLAN:  PT FREQUENCY: 2x/week  PT DURATION: 4 weeks  PLANNED INTERVENTIONS: [id number]- PT Re-evaluation, 97110-Therapeutic exercises, 97530- Therapeutic activity, 97112- Neuromuscular re-education, 97535- Self Care, [id number]- Manual therapy, [phone]- Orthotic Initial, [phone]-  Orthotic/Prosthetic subsequent, Patient/Family education, Balance training, Joint mobilization, Therapeutic exercises, Therapeutic activity, Neuromuscular re-education, Gait training, and Self Care  PLAN FOR NEXT SESSION: MFR/STM to lateral edge of lat, Cont postural stability finding exercises that decrease Lt UE symptoms; P/ROM and STM to L neck and shoulders especially upper traps, levator, posture exercises, L shoulder ROM  [employer], PT 07/29/2024, 12:03 PM   1. Decompression Exercise     Cancer Rehab [phone]    Lie on back on firm surface, knees bent, feet flat, arms turned up, out to sides, backs of hands down. Time _5-15__ minutes. Surface: floor   2. Shoulder Press    Start in Decompression Exercise position. Press shoulders downward towards supporting surface. Hold __2-3__ seconds while counting out loud. Repeat _3-5___ times. Do _1-2___ times per day.   3. Head Press    Bring cervical spine (neck) into neutral position (by either tucking the chin towards the chest or tilting the chin upward). Feel weight on back of head. Press head downward into supporting surface.    Hold _2-3__ seconds. Repeat _3-5__ times. Do _1-2__ times per day.   4. Leg Lengthener    Straighten one leg. Pull toes AND forefoot toward knee, extend heel. Lengthen leg by pulling pelvis away from ribs. Hold _2-3__ seconds. Relax. Repeat __4-6__ times. Do other leg.  Surface: floor   5. Leg Press    Straighten one leg down to floor keeping leg aligned  with hip. Pull toes AND forefoot toward knee; extend heel.  Press entire leg downward (as if pressing leg into sandy beach). DO NOT BEND KNEE. Hold _2-3__ seconds. Do __4-6__ times. Repeat with other leg.  "

## 2024-08-02 ENCOUNTER — Encounter: Payer: Self-pay | Admitting: Physical Therapy

## 2024-08-02 ENCOUNTER — Ambulatory Visit: Admitting: Physical Therapy

## 2024-08-02 DIAGNOSIS — M79602 Pain in left arm: Secondary | ICD-10-CM | POA: Diagnosis not present

## 2024-08-02 DIAGNOSIS — Z170 Estrogen receptor positive status [ER+]: Secondary | ICD-10-CM

## 2024-08-02 DIAGNOSIS — M25612 Stiffness of left shoulder, not elsewhere classified: Secondary | ICD-10-CM

## 2024-08-02 DIAGNOSIS — R293 Abnormal posture: Secondary | ICD-10-CM

## 2024-08-02 NOTE — Therapy (Signed)
 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   Family history of breast cancer 03/20/2022   Family history of pancreatic cancer 03/20/2022   HIV infection (HCC) 1993   Hypertension    Past Surgical History:  Procedure Laterality Date   ABDOMINAL HYSTERECTOMY     APPENDECTOMY     BREAST BIOPSY Right 2015   BREAST BIOPSY Left 08/26/2022   US  LT RADIOACTIVE SEED LOC 08/26/2022 [facility]   BREAST BIOPSY  08/26/2022   MM LT RADIOACTIVE SEED LOC MAMMO GUIDE 08/26/2022 [facility]   BREAST LUMPECTOMY WITH RADIOACTIVE SEED LOCALIZATION Left 08/27/2022   Procedure: LEFT BREAST LUMPECTOMY WITH RADIOACTIVE SEED LOCALIZATION;  Surgeon: Vernetta Berg, MD;  Location: [facility];  Service: General;  Laterality: Left;   CYSTOSCOPY/RETROGRADE/URETEROSCOPY Right 11/14/2016   Procedure: CYSTOSCOPY/RETROGRADE/URETEROSCOPY/ STONE EXTRACTION/HOLMIUM LASER/STENT PLACEMENT;  Surgeon: Mark Ottelin, MD;  Location: [facility];  Service: Urology;  Laterality: Right;   PORT-A-CATH REMOVAL Right 08/27/2022   Procedure: REMOVAL PORT-A-CATH;  Surgeon: Vernetta Berg, MD;  Location: [facility];  Service: General;  Laterality: Right;   PORTACATH PLACEMENT Right 03/26/2022   Procedure: INSERTION PORT-A-CATH;  Surgeon: Vernetta Berg, MD;  Location: [facility];  Service: General;  Laterality:  Right;   RADIOACTIVE SEED GUIDED AXILLARY SENTINEL LYMPH NODE Left 08/27/2022   Procedure: RADIOACTIVE SEED GUIDED LEFT AXILLARY SENTINEL LYMPH NODE DISSECTION;  Surgeon: Vernetta Berg, MD;  Location: [facility];  Service: General;  Laterality: Left;   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   HYPERTENSION, BENIGN 01/26/2007   SHINGLES, RECURRENT 12/08/2006   HIV disease (HCC) 10/26/2006   HX, PERSONAL, PENICILLIN ALLERGY 10/26/2006   HX, PERSONAL, DRUG ALLERGY NOS 10/26/2006    PCP: Raguel Blush, MD  REFERRING PROVIDER: Odean Potts, MD  REFERRING DIAG:  C50.412,Z17.0 (ICD-10-CM) - Malignant neoplasm of upper-outer quadrant of left breast in female, estrogen receptor positive (HCC)  THERAPY DIAG:  Pain in left arm - Plan: PT plan of care cert/re-cert  Stiffness of left shoulder, not elsewhere classified - Plan: PT plan of care cert/re-cert  Abnormal posture - Plan: PT plan of care cert/re-cert  Malignant neoplasm of upper-outer quadrant of left breast in female, estrogen receptor positive (HCC) - Plan: PT plan of care cert/re-cert  ONSET DATE: 06/14/24  Rationale for Evaluation and Treatment: Rehabilitation  SUBJECTIVE:                                                                                                                                                                                           SUBJECTIVE STATEMENT: The pain is doing better. It  is not happening as often.   PERTINENT HISTORY: Post left lumpectomy 08/27/22.  3 of 4 lymph nodes positive. Neoadjuvant chemotherapy completed and radiation. It is ER positive, PR negative, and HER2 negative with a Ki67 of 10%. She has a positive axillary node. HIV  PAIN:  Are you having pain? None today  PRECAUTIONS: Other: at risk of lymphedema  RED FLAGS: None   WEIGHT BEARING RESTRICTIONS: No  FALLS:  Has patient fallen in last 6 months? No  LIVING ENVIRONMENT: Lives with: brother Lives in: House/apartment Stairs: No;  Has following equipment at home: None  OCCUPATION: retired  LEISURE: active with her grandkids, does arm exercises, walks, leg exercises  HAND DOMINANCE: right   PRIOR LEVEL OF FUNCTION: Independent  PATIENT GOALS: to decrease the pain   OBJECTIVE: Note: Objective measures were completed at Evaluation unless otherwise noted.  COGNITION: Overall cognitive status: Within functional limits for tasks assessed   PALPATION: Increased muscle tightness noted in L upper traps, scalenes, lateral and posterior cervical muscles  OBSERVATIONS / OTHER ASSESSMENTS: very mild edema present at posterior wrist and 2nd digit on L  POSTURE: forward head, rounded shoulders  UPPER EXTREMITY AROM/PROM:  A/PROM RIGHT   eval   Shoulder extension 69  Shoulder flexion 154  Shoulder abduction 172  Shoulder internal rotation 51  Shoulder external rotation 97    (Blank rows = not tested)  A/PROM LEFT   eval LEFT 08/03/23  Shoulder extension 61   Shoulder flexion 141 159  Shoulder abduction 154 170  Shoulder internal rotation 64   Shoulder external rotation 85     (Blank rows = not tested)  CERVICAL AROM: All within normal limits:    Percent limited  Flexion WFL  Extension WFL  Right lateral flexion WFL  Left lateral flexion WFL  Right rotation WFL  Left rotation WFL    UPPER EXTREMITY STRENGTH: Shoulders 4/5  LYMPHEDEMA ASSESSMENTS:   SURGERY  TYPE/DATE: L lumpectomy 08/27/22  NUMBER OF LYMPH NODES REMOVED: 3 of 4 nodes positive  CHEMOTHERAPY: completed  RADIATION:completed  HORMONE TREATMENT: yes anastrozole   INFECTIONS: none   LYMPHEDEMA ASSESSMENTS:   LANDMARK RIGHT  eval  At axilla  31  15 cm proximal to the proximal aspect of the olecranon process 27.5  10 cm proximal to the proximal aspect  of the olecranon process 25  Olecranon process 23  15 cm proximal to the proximal aspect of the ulnar styloid process 22  10 cm proximal to the proximal aspect of the ulnar styloid process 19.5  Just distal to the ulnar styloid process 15  Across hand at thumb web space 18  At base of 2nd digit 6.3  (Blank rows = not tested)  LANDMARK LEFT  eval  At axilla  30.5  15 cm proximal to the proximal aspect of the olecranon process 26.9  10 cm proximal to the proximal aspect of the olecranon process 25.5  Olecranon process 22.5  15 cm proximal to the proximal aspect of the ulnar styloid process 21.2  10 cm proximal to  the proximal aspect of the ulnar styloid process 18.5  Just distal to the ulnar styloid process 15.6  Across hand at thumb web space 18  At base of 2nd digit 6.3  (Blank rows = not tested)  Chest circumference just inferior to the axillae:  Chest circumference at the largest point:         L-DEX LYMPHEDEMA SCREENING: The patient was assessed using the L-Dex machine today to produce a lymphedema index baseline score. The patient will be reassessed on a regular basis (typically every 3 months) to obtain new L-Dex scores. If the score is > 6.5 points away from his/her baseline score indicating onset of subclinical lymphedema, it will be recommended to wear a compression garment for 4 weeks, 12 hours per day and then be reassessed. If the score continues to be > 6.5 points from baseline at reassessment, we will initiate lymphedema treatment. Assessing in this manner has a 95% rate of preventing clinically  significant lymphedema.  QUICK DASH SURVEY:                                                                                                                                TREATMENT DATE:  08/02/24: Assessed progress towards goals in therapy Therapeutic Exercises Pulleys into flex and then abd x 2 min each, pt reports  Roll yellow ball up wall into flex x 10, then Lt UE abd x 10 returning therapist demo Therapeutic Activities  Supine on large mat table on 1 pillow: supine scapular series with red band x 10 reps as follows with pt returning therapist demo: narrow and wide grip flexion, horizontal abduction, diagonals, and ER Supine on large mat table: Meeks Decompression x 5 reps, 5 sec holds. Pt able to lay with 1 pillow but did have some tingling in the arm   07/29/24: Therapeutic Exercises Pulleys into flex and then abd x 2 min each, pt reports  Roll yellow ball up wall into flex x 10, then Lt UE abd x 6 returning therapist demo Therapeutic Activities  Supine over half foam roll with 1 pillow but removed 1/2 way throughout due to discomfort in back: supine scapular series with red band x 10 reps  as follows with pt returning therapist demo: narrow and wide grip flexion, horizontal abduction, diagonals, and ER Supine on large mat table: Meeks Decompression x 5 reps, 5 sec holds. Pt able to lay with 1 pillow but did have some tingling in the arm  Manual Therapy In R sidelying: using cocoa butter STM to L serratus and edge of lats where pt has trigger points with decreased muscle tension noted by end of session  07/26/24: Therapeutic Exercises Pulleys into flex and then abd x 2 min each, pt reports  Roll yellow ball up wall into flex x 10, then Lt UE abd x 6 returning therapist demo Therapeutic Activities  Supine over half foam roll with 1 pillow as pt wanted to try again but did decrease reps: Bil horz abd x 5, bil UE scaption into a V x 5, then bil UE abd in a snow angel x 5; pt  reports no pain today Supine on large mat table: Meeks Decompression x 5 reps, 5 sec holds. Pt able to lay with 1 pillow but did have some tingling in the arm  Seated in chair with head against purple ball on wall for following: Cervical retraction x 10 reps with 5 sec holds reviewing proper technique again as pt was performing thoracic ext with cervical retraction, then cervical retraction with holding 1# dumb bell: alternating flexion x 10, bilateral scaption x 10  Manual Therapy Gentle cervical traction with suboccipital release STM in supine to L SCM, upper traps, levator, paraspinals and edge of lat where there is a larger trigger point   07/20/24: Therapeutic Exercises Pulleys into flex and then abd x 2 min each, pt reports  Roll yellow ball up wall into flex x 10, then Lt UE abd x 6 returning therapist demo Therapeutic Activities  Supine over half foam roll with 1 pillow as pt wanted to try again but did decrease reps: Bil horz abd x 5, bil UE scaption into a V x 5, then bil UE abd in a snow angel x 5; pt reports no pain today, just discomfort from new position of stretches on foam Supine on large mat table: Meeks Decompression x 5 reps, 5 sec holds. Pt able to lay with 1 pillow but did have tingling in the arm start with these, but probable due to new exs on foam roll.  Seated in chair with head against purple ball on wall for following: Cervical retraction x 10 reps with 5 sec holds reviewing proper technique as pt was performing thoracic ext with cervical retraction, then cervical retraction with holding 1# dumb bell: alternating flexion x 10, bilateral scaption x 10  Manual Therapy Gentle cervical traction with suboccipital release STM in supine to L SCM, upper traps, levator, paraspinals with cocoa butter P/ROM into cervical bil side bending and rotation with and without overpressure to clavicle for increased stretch  07/14/24: Therapeutic Exercises Pulleys into flex and then  abd x 2 min each returning therapist demo and VC's to relax shoulder during  Roll yellow ball up wall into flex x 5 with feeling of stretching Therapeutic Activities  Supine on large mat table:  Meeks decompression exercises with 5 sec holds x 5 each - able to do without increased tingling in arm with only 1 pillow under head, started having tingling while showing pt supine scap so therapist added 2nd pillow which alleviated tingling, overhead narrow and wide grip flexion x 7 reps each, supine shoulder ER with yellow band x 7,  horizontal  abduction x 7, diagonals x 7 -with pt returning therapist demo, issued as part of HEP Seated in chair with head against purple ball on wall for following: Cervical retraction x 10 reps with 5 sec holds returning therapist demo, then cervical retraction with: alternating flexion x 10, bilateral scaption x 10  Manual Therapy Gentle cervical traction with suboccipital release STM in supine to L SCM, upper traps, levator, paraspinals   07/12/24: Therapeutic Exercises Pulleys into flex and then abd x 2 min each returning therapist demo and VC's to relax shoulder during  Roll yellow ball up wall into flex x 5 with feeling of stretching Therapeutic Activities  Supine on large mat table:  Meeks decompression exercises with 5 sec holds x 5 each - had to add pillow under head due to increased arm tingling and pain and this helped some - therapist also performed gentle STM to L shoulder/neck to help decrease discomfort, supine shoulder ER with yellow band x 7, supine alternating flexion x 7, supine scaption x 10  Seated in chair with head against purple ball on wall for following: Cervical retraction x 10 reps with 5 sec holds returning therapist demo, then cervical retraction with: alternating flexion x 10, bilateral scaption x 10  Manual Therapy Gentle cervical traction with suboccipital release STM in supine to L SCM, upper traps, levator, paraspinals and then to R side  lying to the same and also rhomboids using cocoa butter and wave tool- pt reported relief after this      PATIENT EDUCATION:  Education details: Continued discussion of posture and added Meeks Decompression Exs  Person educated: Patient Education method: Explanation, demonstration, VC's and handout issued Education comprehension: verbalized understanding, returned demo and will benefit from further review  HOME EXERCISE PROGRAM: Access Code: [email or web address] URL: [email or web address] Date: 07/05/2024 Prepared by: Florina Lanis Carbon  Exercises - Seated Cervical Retraction  - 1 x daily - 7 x weekly - 1 sets - 10 reps - 3-5 sec hold - Seated Scapular Retraction  - 1 x daily - 7 x weekly - 1 sets - 10 reps - 3-5 sec hold  07/06/24 - Meeks Decompression Exs  07/14/24- supine scapular series  ASSESSMENT:  CLINICAL IMPRESSION: Assessed pt's progress towards goals in therapy. She has met her ROM goals for her shoulder. She is still having pain and tingling in her L but it is much less than at eval. She would benefit from continued skilled PT services to continue to progress towards pain goal for LUE and also to continue to progress HEP for pt to be independent with after she is discharged.    OBJECTIVE IMPAIRMENTS: decreased knowledge of condition, decreased strength, increased fascial restrictions, increased muscle spasms, impaired UE functional use, postural dysfunction, and pain.   ACTIVITY LIMITATIONS: carrying, lifting, and reach over head  PARTICIPATION LIMITATIONS: cleaning and laundry  PERSONAL FACTORS: Past/current experiences hx of radiation are also affecting patient's functional outcome.   REHAB POTENTIAL: Good  CLINICAL DECISION MAKING: Stable/uncomplicated  EVALUATION COMPLEXITY: Low  GOALS: Goals reviewed with patient? Yes   LONG TERM GOALS=SHORT TERM GOALS Target date: 08/02/24  Pt will report a 75% improvement in pain in LUE to allow pt improved  comfort and function. Baseline:  Goal status: 60% improvement  2.  Pt will demonstrate 150 degrees of L shoulder flexion to allow her to reach up overhead. Baseline:  Goal status: MET 08/02/24- 159 degrees  3.  Pt will demonstrate 160 degrees of L shoulder abduction to allow her  to reach to the side. Baseline:  Goal status: MET 08/02/24 - 170 degrees  4.  Pt will be independent in a home exercise program to continue to improve posture and postural awareness. Baseline:  Goal status: ONGOING  PLAN:  PT FREQUENCY: 2x/week  PT DURATION: 4 weeks  PLANNED INTERVENTIONS: 97164- PT Re-evaluation, 97110-Therapeutic exercises, 97530- Therapeutic activity, 97112- Neuromuscular re-education, 97535- Self Care, [id number]- Manual therapy, [phone]- Orthotic Initial, [phone]- Orthotic/Prosthetic subsequent, Patient/Family education, Balance training, Joint mobilization, Therapeutic exercises, Therapeutic activity, Neuromuscular re-education, Gait training, and Self Care  PLAN FOR NEXT SESSION: MFR/STM to lateral edge of lat, Cont postural stability finding exercises that decrease Lt UE symptoms; P/ROM and STM to L neck and shoulders especially upper traps, levator, posture exercises, L shoulder ROM  [employer], PT 08/02/2024, 4:00 PM   1. Decompression Exercise     Cancer Rehab [phone]    Lie on back on firm surface, knees bent, feet flat, arms turned up, out to sides, backs of hands down. Time _5-15__ minutes. Surface: floor   2. Shoulder Press    Start in Decompression Exercise position. Press shoulders downward towards supporting surface. Hold __2-3__ seconds while counting out loud. Repeat _3-5___ times. Do _1-2___ times per day.   3. Head Press    Bring cervical spine (neck) into neutral position (by either tucking the chin towards the chest or tilting the chin upward). Feel weight on back of head. Press head downward into supporting surface.    Hold _2-3__ seconds. Repeat _3-5__  times. Do _1-2__ times per day.   4. Leg Lengthener    Straighten one leg. Pull toes AND forefoot toward knee, extend heel. Lengthen leg by pulling pelvis away from ribs. Hold _2-3__ seconds. Relax. Repeat __4-6__ times. Do other leg.  Surface: floor   5. Leg Press    Straighten one leg down to floor keeping leg aligned with hip. Pull toes AND forefoot toward knee; extend heel.  Press entire leg downward (as if pressing leg into sandy beach). DO NOT BEND KNEE. Hold _2-3__ seconds. Do __4-6__ times. Repeat with other leg.  "

## 2024-08-08 ENCOUNTER — Ambulatory Visit

## 2024-08-09 ENCOUNTER — Ambulatory Visit

## 2024-08-09 DIAGNOSIS — C50412 Malignant neoplasm of upper-outer quadrant of left female breast: Secondary | ICD-10-CM

## 2024-08-09 DIAGNOSIS — M79602 Pain in left arm: Secondary | ICD-10-CM

## 2024-08-09 DIAGNOSIS — R293 Abnormal posture: Secondary | ICD-10-CM

## 2024-08-09 DIAGNOSIS — M25612 Stiffness of left shoulder, not elsewhere classified: Secondary | ICD-10-CM

## 2024-08-09 NOTE — Therapy (Signed)
 " OUTPATIENT PHYSICAL THERAPY  UPPER EXTREMITY ONCOLOGY TREATMENT  Patient Name: Veronica Robles MRN: [id number] DOB:1956/07/12, 69 y.o., female Today's Date: 08/09/2024  END OF SESSION:  PT End of Session - 08/09/24 1456     Visit Number 9    Number of Visits 16    Date for Recertification  08/30/24    Authorization Type None needed    PT Start Time 1458    PT Stop Time 1549    PT Time Calculation (min) 51 min    Activity Tolerance Patient tolerated treatment well    Behavior During Therapy [facility] for tasks assessed/performed              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   Family history of breast cancer 03/20/2022   Family history of pancreatic cancer 03/20/2022   HIV infection (HCC) 1993   Hypertension    Past Surgical History:  Procedure Laterality Date   ABDOMINAL HYSTERECTOMY     APPENDECTOMY     BREAST BIOPSY Right 2015   BREAST BIOPSY Left 08/26/2022   US  LT RADIOACTIVE SEED LOC 08/26/2022 [facility]   BREAST BIOPSY  08/26/2022   MM LT RADIOACTIVE SEED LOC MAMMO GUIDE 08/26/2022 [facility]   BREAST LUMPECTOMY WITH RADIOACTIVE SEED LOCALIZATION Left 08/27/2022   Procedure: LEFT BREAST LUMPECTOMY WITH RADIOACTIVE SEED LOCALIZATION;  Surgeon: Vernetta Berg, MD;  Location: [facility];  Service: General;  Laterality: Left;   CYSTOSCOPY/RETROGRADE/URETEROSCOPY Right 11/14/2016   Procedure: CYSTOSCOPY/RETROGRADE/URETEROSCOPY/ STONE EXTRACTION/HOLMIUM LASER/STENT PLACEMENT;  Surgeon: Mark Ottelin, MD;  Location: [facility];  Service: Urology;  Laterality: Right;   PORT-A-CATH REMOVAL Right 08/27/2022   Procedure: REMOVAL PORT-A-CATH;  Surgeon: Vernetta Berg, MD;  Location: [facility];  Service: General;  Laterality: Right;   PORTACATH PLACEMENT Right 03/26/2022   Procedure: INSERTION PORT-A-CATH;  Surgeon: Vernetta Berg, MD;  Location: [facility];   Service: General;  Laterality: Right;   RADIOACTIVE SEED GUIDED AXILLARY SENTINEL LYMPH NODE Left 08/27/2022   Procedure: RADIOACTIVE SEED GUIDED LEFT AXILLARY SENTINEL LYMPH NODE DISSECTION;  Surgeon: Vernetta Berg, MD;  Location: [facility];  Service: General;  Laterality: Left;   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   HYPERTENSION, BENIGN 01/26/2007   SHINGLES, RECURRENT 12/08/2006   HIV disease (HCC) 10/26/2006   HX, PERSONAL, PENICILLIN ALLERGY 10/26/2006   HX, PERSONAL, DRUG ALLERGY NOS 10/26/2006    PCP: Raguel Blush, MD  REFERRING PROVIDER: Odean Potts, MD  REFERRING DIAG:  C50.412,Z17.0 (ICD-10-CM) - Malignant neoplasm of upper-outer quadrant of left breast in female, estrogen receptor positive (HCC)  THERAPY DIAG:  Pain in left arm  Stiffness of left shoulder, not elsewhere classified  Abnormal posture  Malignant neoplasm of upper-outer quadrant of left breast in female, estrogen receptor positive (HCC)  ONSET DATE: 06/14/24  Rationale for Evaluation and Treatment: Rehabilitation  SUBJECTIVE:  SUBJECTIVE STATEMENT:  I didn't like the foam roll. My shoulder is better. I still feel the tingling, but not as bad.  PERTINENT HISTORY: Post left lumpectomy  08/27/22.  3 of 4 lymph nodes positive. Neoadjuvant chemotherapy completed and radiation. It is ER positive, PR negative, and HER2 negative with a Ki67 of 10%. She has a positive axillary node. HIV  PAIN:  Are you having pain?  PAIN:  Are you having pain? Yes NPRS scale: 2/10 Pain location: left hand Pain orientation: Left  PAIN TYPE: throbbing and tingling Pain description: intermittent  Aggravating factors: random times Relieving factors: tylenol   PRECAUTIONS: Other: at risk of lymphedema  RED FLAGS: None   WEIGHT BEARING RESTRICTIONS: No  FALLS:  Has patient fallen in last 6 months? No  LIVING ENVIRONMENT: Lives with: brother Lives in: House/apartment Stairs: No;  Has following equipment at home: None  OCCUPATION: retired  LEISURE: active with her grandkids, does arm exercises, walks, leg exercises  HAND DOMINANCE: right   PRIOR LEVEL OF FUNCTION: Independent  PATIENT GOALS: to decrease the pain   OBJECTIVE: Note: Objective measures were completed at Evaluation unless otherwise noted.  COGNITION: Overall cognitive status: Within functional limits for tasks assessed   PALPATION: Increased muscle tightness noted in L upper traps, scalenes, lateral and posterior cervical muscles  OBSERVATIONS / OTHER ASSESSMENTS: very mild edema present at posterior wrist and 2nd digit on L  POSTURE: forward head, rounded shoulders  UPPER EXTREMITY AROM/PROM:  A/PROM RIGHT   eval   Shoulder extension 69  Shoulder flexion 154  Shoulder abduction 172  Shoulder internal rotation 51  Shoulder external rotation 97    (Blank rows = not tested)  A/PROM LEFT   eval LEFT 08/03/23  Shoulder extension 61   Shoulder flexion 141 159  Shoulder abduction 154 170  Shoulder internal rotation 64   Shoulder external rotation 85     (Blank rows = not tested)  CERVICAL AROM: All within normal limits:    Percent limited  Flexion WFL  Extension WFL  Right lateral flexion WFL   Left lateral flexion WFL  Right rotation WFL  Left rotation WFL    UPPER EXTREMITY STRENGTH: Shoulders 4/5  LYMPHEDEMA ASSESSMENTS:   SURGERY TYPE/DATE: L lumpectomy 08/27/22  NUMBER OF LYMPH NODES REMOVED: 3 of 4 nodes positive  CHEMOTHERAPY: completed  RADIATION:completed  HORMONE TREATMENT: yes anastrozole   INFECTIONS: none   LYMPHEDEMA ASSESSMENTS:   LANDMARK RIGHT  eval  At axilla  31  15 cm proximal to the proximal aspect of the olecranon process 27.5  10 cm proximal to the proximal aspect of the olecranon process 25  Olecranon process 23  15 cm proximal to the proximal aspect of the ulnar styloid process 22  10 cm proximal to the proximal aspect of the ulnar styloid process 19.5  Just distal to the ulnar styloid process 15  Across hand at thumb web space 18  At base of 2nd digit 6.3  (Blank rows = not tested)  LANDMARK LEFT  eval  At axilla  30.5  15 cm proximal to the proximal aspect of the olecranon process 26.9  10 cm proximal to the proximal aspect of the olecranon process 25.5  Olecranon process 22.5  15 cm proximal to the proximal aspect of the ulnar styloid process 21.2  10 cm proximal to  the proximal aspect of the ulnar styloid process 18.5  Just distal to the ulnar styloid process 15.6  Across hand at thumb web space 18  At base of 2nd digit 6.3  (Blank rows = not tested)  Chest circumference just inferior to the axillae:  Chest circumference at the largest point:         L-DEX LYMPHEDEMA SCREENING: The patient was assessed using the L-Dex machine today to produce a lymphedema index baseline score. The patient will be reassessed on a regular basis (typically every 3 months) to obtain new L-Dex scores. If the score is > 6.5 points away from his/her baseline score indicating onset of subclinical lymphedema, it will be recommended to wear a compression garment for 4 weeks, 12 hours per day and then be reassessed. If the score continues to be >  6.5 points from baseline at reassessment, we will initiate lymphedema treatment. Assessing in this manner has a 95% rate of preventing clinically significant lymphedema.  QUICK DASH SURVEY:                                                                                                                                TREATMENT DATE:   08/09/2024 Discussed progress  Therapeutic Exercises Pulleys into flex x 3 min and then abd x 2 min  Roll yellow ball up wall into flex x 10, then Lt UE abd x 10 returning therapist demo Therapeutic Activities  Supine on large mat table on 1 pillow: supine scapular series with red band x 10 reps as follows with pt returning therapist demo: narrow and wide grip flexion, horizontal abduction, diagonals, and ER Supine on large mat table: Meeks Decompression x 5 reps, 5 sec holds.  MANUAL STM with cocoa butter to left UT/levator and lateral trunk/scapular areas of tenderness in supine   08/02/24: Assessed progress towards goals in therapy Therapeutic Exercises Pulleys into flex and then abd x 2 min each, pt reports  Roll yellow ball up wall into flex x 10, then Lt UE abd x 10 returning therapist demo Therapeutic Activities  Supine on large mat table on 1 pillow: supine scapular series with red band x 10 reps as follows with pt returning therapist demo: narrow and wide grip flexion, horizontal abduction, diagonals, and ER Supine on large mat table: Meeks Decompression x 5 reps, 5 sec holds. Pt able to lay with 1 pillow but did have some tingling in the arm   07/29/24: Therapeutic Exercises Pulleys into flex and then abd x 2 min each, pt reports  Roll yellow ball up wall into flex x 10, then Lt UE abd x 6 returning therapist demo Therapeutic Activities  Supine over half foam roll with 1 pillow but removed 1/2 way throughout due to discomfort in back: supine scapular series with red band x 10 reps as follows with pt returning therapist demo: narrow and wide grip  flexion, horizontal abduction, diagonals, and ER Supine on large mat table: Meeks Decompression x 5 reps, 5 sec holds. Pt able to lay with 1 pillow but did have some tingling in the arm  Manual Therapy In R sidelying: using cocoa butter STM to L serratus and edge of lats where pt has trigger points with decreased muscle tension noted by end of session  07/26/24: Therapeutic Exercises Pulleys into flex and then abd x 2 min each, pt reports  Roll yellow ball up wall into flex x 10, then Lt UE abd x 6 returning therapist demo Therapeutic Activities  Supine over half foam roll with 1 pillow as pt wanted to try again but did decrease reps: Bil horz abd x 5, bil UE scaption into a V x 5, then bil UE abd in a snow angel x 5; pt reports no pain today Supine on large mat table: Meeks Decompression x 5 reps, 5 sec holds. Pt able to lay with 1 pillow but did have some tingling in the arm  Seated in chair with head against purple ball on wall for following: Cervical retraction x 10 reps with 5 sec holds reviewing proper technique again as pt was performing thoracic ext with cervical retraction, then cervical retraction with holding 1# dumb bell: alternating flexion x 10, bilateral scaption x 10  Manual Therapy Gentle cervical traction with suboccipital release STM in supine to L SCM, upper traps, levator, paraspinals and edge of lat where there is a larger trigger point   07/20/24: Therapeutic Exercises Pulleys into flex and then abd x 2 min each, pt reports  Roll yellow ball up wall into flex x 10, then Lt UE abd x 6 returning therapist demo Therapeutic Activities  Supine over half foam roll with 1 pillow as pt wanted to try again but did decrease reps: Bil horz abd x 5, bil UE scaption into a V x 5, then bil UE abd in a snow angel x 5; pt reports no pain today, just discomfort from new position of stretches on foam Supine on large mat table: Meeks Decompression x 5 reps, 5 sec holds. Pt able to  lay with 1 pillow but did have tingling in the arm start with these, but probable due to new exs on foam roll.  Seated in chair with head against purple ball on wall for following: Cervical retraction x 10 reps with 5 sec holds reviewing proper technique as pt was performing thoracic ext with cervical retraction, then cervical retraction with holding 1# dumb bell: alternating flexion x 10, bilateral scaption x 10  Manual Therapy Gentle cervical traction with suboccipital release STM in supine to L SCM, upper traps, levator, paraspinals with cocoa butter P/ROM into cervical bil side bending and rotation with and without overpressure to clavicle for increased stretch  07/14/24: Therapeutic Exercises Pulleys into flex and then abd x 2 min each returning therapist demo and VC's to relax shoulder during  Roll yellow ball up wall into flex x 5 with feeling of stretching Therapeutic Activities  Supine on large mat table:  Meeks decompression exercises with 5 sec holds x 5 each - able to do without increased tingling in arm with only 1 pillow under head, started having tingling while showing pt supine scap so therapist added 2nd pillow which alleviated tingling, overhead narrow and wide grip flexion x 7 reps each, supine shoulder ER with yellow band x 7,  horizontal abduction x 7, diagonals x 7 -with pt returning therapist demo, issued as part of HEP Seated in chair with head against purple ball on wall for following: Cervical retraction x 10 reps with 5 sec holds returning therapist demo, then cervical retraction with: alternating flexion x  10, bilateral scaption x 10  Manual Therapy Gentle cervical traction with suboccipital release STM in supine to L SCM, upper traps, levator, paraspinals   07/12/24: Therapeutic Exercises Pulleys into flex and then abd x 2 min each returning therapist demo and VC's to relax shoulder during  Roll yellow ball up wall into flex x 5 with feeling of  stretching Therapeutic Activities  Supine on large mat table:  Meeks decompression exercises with 5 sec holds x 5 each - had to add pillow under head due to increased arm tingling and pain and this helped some - therapist also performed gentle STM to L shoulder/neck to help decrease discomfort, supine shoulder ER with yellow band x 7, supine alternating flexion x 7, supine scaption x 10  Seated in chair with head against purple ball on wall for following: Cervical retraction x 10 reps with 5 sec holds returning therapist demo, then cervical retraction with: alternating flexion x 10, bilateral scaption x 10  Manual Therapy Gentle cervical traction with suboccipital release STM in supine to L SCM, upper traps, levator, paraspinals and then to R side lying to the same and also rhomboids using cocoa butter and wave tool- pt reported relief after this      PATIENT EDUCATION:  Education details: Continued discussion of posture and added Meeks Decompression Exs  Person educated: Patient Education method: Explanation, demonstration, VC's and handout issued Education comprehension: verbalized understanding, returned demo and will benefit from further review  HOME EXERCISE PROGRAM: Access Code: [email or web address] URL: [email or web address] Date: 07/05/2024 Prepared by: Florina Lanis Carbon  Exercises - Seated Cervical Retraction  - 1 x daily - 7 x weekly - 1 sets - 10 reps - 3-5 sec hold - Seated Scapular Retraction  - 1 x daily - 7 x weekly - 1 sets - 10 reps - 3-5 sec hold  07/06/24 - Meeks Decompression Exs  07/14/24- supine scapular series  ASSESSMENT:  CLINICAL IMPRESSION: Pt did well with exercises with only occasional VC's especially for proper breathing and no increased pain. She was tight and tender during manual therapy at lateral trunk and left UT/levator. She will benefit from continued skilled therapy to address remaining deficits.   OBJECTIVE IMPAIRMENTS: decreased  knowledge of condition, decreased strength, increased fascial restrictions, increased muscle spasms, impaired UE functional use, postural dysfunction, and pain.   ACTIVITY LIMITATIONS: carrying, lifting, and reach over head  PARTICIPATION LIMITATIONS: cleaning and laundry  PERSONAL FACTORS: Past/current experiences hx of radiation are also affecting patient's functional outcome.   REHAB POTENTIAL: Good  CLINICAL DECISION MAKING: Stable/uncomplicated  EVALUATION COMPLEXITY: Low  GOALS: Goals reviewed with patient? Yes   LONG TERM GOALS=SHORT TERM GOALS Target date: 08/02/24  Pt will report a 75% improvement in pain in LUE to allow pt improved comfort and function. Baseline:  Goal status: 60% improvement  2.  Pt will demonstrate 150 degrees of L shoulder flexion to allow her to reach up overhead. Baseline:  Goal status: MET 08/02/24- 159 degrees  3.  Pt will demonstrate 160 degrees of L shoulder abduction to allow her to reach to the side. Baseline:  Goal status: MET 08/02/24 - 170 degrees  4.  Pt will be independent in a home exercise program to continue to improve posture and postural awareness. Baseline:  Goal status: ONGOING  PLAN:  PT FREQUENCY: 2x/week  PT DURATION: 4 weeks  PLANNED INTERVENTIONS: [id number]- PT Re-evaluation, 97110-Therapeutic exercises, 97530- Therapeutic activity, [id number]- Neuromuscular re-education, 97535- Self Care, [id number]- Manual therapy, [id number]- Orthotic Initial, [id number]-  Orthotic/Prosthetic subsequent, Patient/Family education, Balance training, Joint mobilization, Therapeutic exercises, Therapeutic activity, Neuromuscular re-education, Gait training, and Self Care  PLAN FOR NEXT SESSION: MFR/STM to lateral edge of lat, Cont postural stability finding exercises that decrease Lt UE symptoms; P/ROM and STM to L neck and shoulders especially upper traps, levator, posture exercises, L shoulder ROM  Grayce JINNY Sheldon, PT 08/09/2024, 3:56 PM   1. Decompression  Exercise     Cancer Rehab [id number]    Lie on back on firm surface, knees bent, feet flat, arms turned up, out to sides, backs of hands down. Time _5-15__ minutes. Surface: floor   2. Shoulder Press    Start in Decompression Exercise position. Press shoulders downward towards supporting surface. Hold __2-3__ seconds while counting out loud. Repeat _3-5___ times. Do _1-2___ times per day.   3. Head Press    Bring cervical spine (neck) into neutral position (by either tucking the chin towards the chest or tilting the chin upward). Feel weight on back of head. Press head downward into supporting surface.    Hold _2-3__ seconds. Repeat _3-5__ times. Do _1-2__ times per day.   4. Leg Lengthener    Straighten one leg. Pull toes AND forefoot toward knee, extend heel. Lengthen leg by pulling pelvis away from ribs. Hold _2-3__ seconds. Relax. Repeat __4-6__ times. Do other leg.  Surface: floor   5. Leg Press    Straighten one leg down to floor keeping leg aligned with hip. Pull toes AND forefoot toward knee; extend heel.  Press entire leg downward (as if pressing leg into sandy beach). DO NOT BEND KNEE. Hold _2-3__ seconds. Do __4-6__ times. Repeat with other leg.  "

## 2024-08-11 ENCOUNTER — Ambulatory Visit

## 2024-08-16 ENCOUNTER — Encounter: Payer: Self-pay | Admitting: Hematology and Oncology

## 2024-08-16 ENCOUNTER — Ambulatory Visit

## 2024-08-16 DIAGNOSIS — M79602 Pain in left arm: Secondary | ICD-10-CM

## 2024-08-16 DIAGNOSIS — Z483 Aftercare following surgery for neoplasm: Secondary | ICD-10-CM

## 2024-08-16 DIAGNOSIS — C50412 Malignant neoplasm of upper-outer quadrant of left female breast: Secondary | ICD-10-CM

## 2024-08-16 DIAGNOSIS — R293 Abnormal posture: Secondary | ICD-10-CM

## 2024-08-16 DIAGNOSIS — M25612 Stiffness of left shoulder, not elsewhere classified: Secondary | ICD-10-CM

## 2024-08-16 NOTE — Therapy (Signed)
 " OUTPATIENT PHYSICAL THERAPY  UPPER EXTREMITY ONCOLOGY TREATMENT  Patient Name: Veronica Robles MRN: [id number] DOB:November 23, 1955, 69 y.o., female Today's Date: 08/16/2024  END OF SESSION:  PT End of Session - 08/16/24 1501     Visit Number 10    Number of Visits 16    Date for Recertification  08/30/24    Authorization Type None needed    PT Start Time 1502    PT Stop Time 1556    PT Time Calculation (min) 54 min    Activity Tolerance Patient tolerated treatment well    Behavior During Therapy [facility] for tasks assessed/performed              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   Family history of breast cancer 03/20/2022   Family history of pancreatic cancer 03/20/2022   HIV infection (HCC) 1993   Hypertension    Past Surgical History:  Procedure Laterality Date   ABDOMINAL HYSTERECTOMY     APPENDECTOMY     BREAST BIOPSY Right 2015   BREAST BIOPSY Left 08/26/2022   US  LT RADIOACTIVE SEED LOC 08/26/2022 [facility]   BREAST BIOPSY  08/26/2022   MM LT RADIOACTIVE SEED LOC MAMMO GUIDE 08/26/2022 [facility]   BREAST LUMPECTOMY WITH RADIOACTIVE SEED LOCALIZATION Left 08/27/2022   Procedure: LEFT BREAST LUMPECTOMY WITH RADIOACTIVE SEED LOCALIZATION;  Surgeon: Vernetta Berg, MD;  Location: [facility];  Service: General;  Laterality: Left;   CYSTOSCOPY/RETROGRADE/URETEROSCOPY Right 11/14/2016   Procedure: CYSTOSCOPY/RETROGRADE/URETEROSCOPY/ STONE EXTRACTION/HOLMIUM LASER/STENT PLACEMENT;  Surgeon: Mark Ottelin, MD;  Location: [facility];  Service: Urology;  Laterality: Right;   PORT-A-CATH REMOVAL Right 08/27/2022   Procedure: REMOVAL PORT-A-CATH;  Surgeon: Vernetta Berg, MD;  Location: [facility];  Service: General;  Laterality: Right;   PORTACATH PLACEMENT Right 03/26/2022   Procedure: INSERTION PORT-A-CATH;  Surgeon: Vernetta Berg, MD;  Location: [facility];   Service: General;  Laterality: Right;   RADIOACTIVE SEED GUIDED AXILLARY SENTINEL LYMPH NODE Left 08/27/2022   Procedure: RADIOACTIVE SEED GUIDED LEFT AXILLARY SENTINEL LYMPH NODE DISSECTION;  Surgeon: Vernetta Berg, MD;  Location:  S[facility]ERY [facility];  Service: General;  Laterality: Left;   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   HYPERTENSION, BENIGN 01/26/2007   SHINGLES, RECURRENT 12/08/2006   HIV disease (HCC) 10/26/2006   HX, PERSONAL, PENICILLIN ALLERGY 10/26/2006   HX, PERSONAL, DRUG ALLERGY NOS 10/26/2006    PCP: Raguel Blush, MD  REFERRING PROVIDER: Odean Potts, MD  REFERRING DIAG:  C50.412,Z17.0 (ICD-10-CM) - Malignant neoplasm of upper-outer quadrant of left breast in female, estrogen receptor positive (HCC)  THERAPY DIAG:  Pain in left arm  Stiffness of left shoulder, not elsewhere classified  Abnormal posture  Malignant neoplasm of upper-outer quadrant of left breast in female, estrogen receptor positive (HCC)  Aftercare following surgery for neoplasm  ONSET DATE: 06/14/24  Rationale for Evaluation and Treatment: Rehabilitation  SUBJECTIVE:  SUBJECTIVE STATEMENT:  My shoulder is a lot better, but when you did the massage last time it was tender along the outside  of my breast. I still have a little tingling in the hand more than the arm.  PERTINENT HISTORY: Post left lumpectomy 08/27/22.  3 of 4 lymph nodes positive. Neoadjuvant chemotherapy completed and radiation. It is ER positive, PR negative, and HER2 negative with a Ki67 of 10%. She has a positive axillary node. HIV  PAIN:  Are you having pain?  PAIN:  Are you having pain? No, just tingling NPRS scale:  Pain location: left hand Pain orientation: Left  PAIN TYPE: tingling Pain description: intermittent  Aggravating factors: random times Relieving factors: tylenol   PRECAUTIONS: Other: at risk of lymphedema  RED FLAGS: None   WEIGHT BEARING RESTRICTIONS: No  FALLS:  Has patient fallen in last 6 months? No  LIVING ENVIRONMENT: Lives with: brother Lives in: House/apartment Stairs: No;  Has following equipment at home: None  OCCUPATION: retired  LEISURE: active with her grandkids, does arm exercises, walks, leg exercises  HAND DOMINANCE: right   PRIOR LEVEL OF FUNCTION: Independent  PATIENT GOALS: to decrease the pain   OBJECTIVE: Note: Objective measures were completed at Evaluation unless otherwise noted.  COGNITION: Overall cognitive status: Within functional limits for tasks assessed   PALPATION: Increased muscle tightness noted in L upper traps, scalenes, lateral and posterior cervical muscles  OBSERVATIONS / OTHER ASSESSMENTS: very mild edema present at posterior wrist and 2nd digit on L  POSTURE: forward head, rounded shoulders  UPPER EXTREMITY AROM/PROM:  A/PROM RIGHT   eval   Shoulder extension 69  Shoulder flexion 154  Shoulder abduction 172  Shoulder internal rotation 51  Shoulder external rotation 97    (Blank rows = not tested)  A/PROM LEFT   eval LEFT 08/03/23  Shoulder extension 61   Shoulder flexion 141 159  Shoulder abduction 154 170  Shoulder internal rotation 64   Shoulder external rotation 85     (Blank rows = not tested)  CERVICAL  AROM: All within normal limits:    Percent limited  Flexion WFL  Extension WFL  Right lateral flexion WFL  Left lateral flexion WFL  Right rotation WFL  Left rotation WFL    UPPER EXTREMITY STRENGTH: Shoulders 4/5  LYMPHEDEMA ASSESSMENTS:   SURGERY TYPE/DATE: L lumpectomy 08/27/22  NUMBER OF LYMPH NODES REMOVED: 3 of 4 nodes positive  CHEMOTHERAPY: completed  RADIATION:completed  HORMONE TREATMENT: yes anastrozole   INFECTIONS: none   LYMPHEDEMA ASSESSMENTS:   LANDMARK RIGHT  eval  At axilla  31  15 cm proximal to the proximal aspect of the olecranon process 27.5  10 cm proximal to the proximal aspect of the olecranon process 25  Olecranon process 23  15 cm proximal to the proximal aspect of the ulnar styloid process 22  10 cm proximal to the proximal aspect of the ulnar styloid process 19.5  Just distal to the ulnar styloid process 15  Across hand at thumb web space 18  At base of 2nd digit 6.3  (Blank rows = not tested)  LANDMARK LEFT  eval  At axilla  30.5  15 cm proximal to the proximal aspect of the olecranon process 26.9  10 cm proximal to the proximal aspect of the olecranon process 25.5  Olecranon process 22.5  15 cm proximal to the proximal aspect of the ulnar styloid process 21.2  10 cm proximal to  the proximal aspect of the ulnar styloid process 18.5  Just distal to the ulnar styloid process 15.6  Across hand at thumb web space 18  At base of 2nd digit 6.3  (Blank rows = not tested)  Chest circumference just inferior to the axillae:  Chest circumference at the largest point:         L-DEX LYMPHEDEMA SCREENING: The patient was assessed using the L-Dex machine today to produce a lymphedema index baseline score. The patient will be reassessed on a regular basis (typically every 3 months) to obtain new L-Dex scores. If the score is > 6.5 points away from his/her baseline score indicating onset of subclinical lymphedema, it will be recommended to  wear a compression garment for 4 weeks, 12 hours per day and then be reassessed. If the score continues to be > 6.5 points from baseline at reassessment, we will initiate lymphedema treatment. Assessing in this manner has a 95% rate of preventing clinically significant lymphedema.  QUICK DASH SURVEY:                                                                                                                                TREATMENT DATE:   08/16/2024 Discussed progress Pulleys x 2 min flexion and abduction Ball rolls on wall x 10 flexion and left abd. X 8 Standing postural exercises with red band; scapular retraction, shoulder extension, Bilateral ER x 10 ea Wall walk to the right to stretch left lateral trunk; no real stretch felt so tried standing SB stretch to the right x 3 with arm overhead and good stretch felt. Standing lat stretch at laundry cart x 4, 10 sec holds Supine snow angels x 7 Lower trunk rotation x 4-5 reps bilaterally Updated HEP MANUAL STM with cocoa butter to left UT/levator and lateral trunk/scapular areas of tenderness in supine PROM left shoulder flex, scaption, abd, ER  08/09/2024 Discussed progress  Therapeutic Exercises Pulleys into flex x 3 min and then abd x 2 min  Roll yellow ball up wall into flex x 10, then Lt UE abd x 10 returning therapist demo Therapeutic Activities  Supine on large mat table on 1 pillow: supine scapular series with red band x 10 reps as follows with pt returning therapist demo: narrow and wide grip flexion, horizontal abduction, diagonals, and ER Supine on large mat table: Meeks Decompression x 5 reps, 5 sec holds.  MANUAL STM with cocoa butter to left UT/levator and lateral trunk/scapular areas of tenderness in supine   08/02/24: Assessed progress towards goals in therapy Therapeutic Exercises Pulleys into flex and then abd x 2 min each, pt reports  Roll yellow ball up wall into flex x 10, then Lt UE abd x 10 returning  therapist demo Therapeutic Activities  Supine on large mat table on 1 pillow: supine scapular series with red band x 10 reps as follows with pt returning therapist demo: narrow and wide grip flexion, horizontal abduction, diagonals, and ER Supine on large mat table: Meeks  Decompression x 5 reps, 5 sec holds. Pt able to lay with 1 pillow but did have some tingling in the arm   07/29/24: Therapeutic Exercises Pulleys into flex and then abd x 2 min each, pt reports  Roll yellow ball up wall into flex x 10, then Lt UE abd x 6 returning therapist demo Therapeutic Activities  Supine over half foam roll with 1 pillow but removed 1/2 way throughout due to discomfort in back: supine scapular series with red band x 10 reps as follows with pt returning therapist demo: narrow and wide grip flexion, horizontal abduction, diagonals, and ER Supine on large mat table: Meeks Decompression x 5 reps, 5 sec holds. Pt able to lay with 1 pillow but did have some tingling in the arm  Manual Therapy In R sidelying: using cocoa butter STM to L serratus and edge of lats where pt has trigger points with decreased muscle tension noted by end of session  07/26/24: Therapeutic Exercises Pulleys into flex and then abd x 2 min each, pt reports  Roll yellow ball up wall into flex x 10, then Lt UE abd x 6 returning therapist demo Therapeutic Activities  Supine over half foam roll with 1 pillow as pt wanted to try again but did decrease reps: Bil horz abd x 5, bil UE scaption into a V x 5, then bil UE abd in a snow angel x 5; pt reports no pain today Supine on large mat table: Meeks Decompression x 5 reps, 5 sec holds. Pt able to lay with 1 pillow but did have some tingling in the arm  Seated in chair with head against purple ball on wall for following: Cervical retraction x 10 reps with 5 sec holds reviewing proper technique again as pt was performing thoracic ext with cervical retraction, then cervical retraction with  holding 1# dumb bell: alternating flexion x 10, bilateral scaption x 10  Manual Therapy Gentle cervical traction with suboccipital release STM in supine to L SCM, upper traps, levator, paraspinals and edge of lat where there is a larger trigger point   07/20/24: Therapeutic Exercises Pulleys into flex and then abd x 2 min each, pt reports  Roll yellow ball up wall into flex x 10, then Lt UE abd x 6 returning therapist demo Therapeutic Activities  Supine over half foam roll with 1 pillow as pt wanted to try again but did decrease reps: Bil horz abd x 5, bil UE scaption into a V x 5, then bil UE abd in a snow angel x 5; pt reports no pain today, just discomfort from new position of stretches on foam Supine on large mat table: Meeks Decompression x 5 reps, 5 sec holds. Pt able to lay with 1 pillow but did have tingling in the arm start with these, but probable due to new exs on foam roll.  Seated in chair with head against purple ball on wall for following: Cervical retraction x 10 reps with 5 sec holds reviewing proper technique as pt was performing thoracic ext with cervical retraction, then cervical retraction with holding 1# dumb bell: alternating flexion x 10, bilateral scaption x 10  Manual Therapy Gentle cervical traction with suboccipital release STM in supine to L SCM, upper traps, levator, paraspinals with cocoa butter P/ROM into cervical bil side bending and rotation with and without overpressure to clavicle for increased stretch  07/14/24: Therapeutic Exercises Pulleys into flex and then abd x 2 min each returning therapist demo and VC's to  relax shoulder during  Roll yellow ball up wall into flex x 5 with feeling of stretching Therapeutic Activities  Supine on large mat table:  Meeks decompression exercises with 5 sec holds x 5 each - able to do without increased tingling in arm with only 1 pillow under head, started having tingling while showing pt supine scap so therapist  added 2nd pillow which alleviated tingling, overhead narrow and wide grip flexion x 7 reps each, supine shoulder ER with yellow band x 7,  horizontal abduction x 7, diagonals x 7 -with pt returning therapist demo, issued as part of HEP Seated in chair with head against purple ball on wall for following: Cervical retraction x 10 reps with 5 sec holds returning therapist demo, then cervical retraction with: alternating flexion x 10, bilateral scaption x 10  Manual Therapy Gentle cervical traction with suboccipital release STM in supine to L SCM, upper traps, levator, paraspinals   07/12/24: Therapeutic Exercises Pulleys into flex and then abd x 2 min each returning therapist demo and VC's to relax shoulder during  Roll yellow ball up wall into flex x 5 with feeling of stretching Therapeutic Activities  Supine on large mat table:  Meeks decompression exercises with 5 sec holds x 5 each - had to add pillow under head due to increased arm tingling and pain and this helped some - therapist also performed gentle STM to L shoulder/neck to help decrease discomfort, supine shoulder ER with yellow band x 7, supine alternating flexion x 7, supine scaption x 10  Seated in chair with head against purple ball on wall for following: Cervical retraction x 10 reps with 5 sec holds returning therapist demo, then cervical retraction with: alternating flexion x 10, bilateral scaption x 10  Manual Therapy Gentle cervical traction with suboccipital release STM in supine to L SCM, upper traps, levator, paraspinals and then to R side lying to the same and also rhomboids using cocoa butter and wave tool- pt reported relief after this      PATIENT EDUCATION:  Access Code: [email or web address] URL: [email or web address] Date: 08/16/2024 Prepared by: Grayce Sheldon  Exercises - Standing 'L' Stretch at Counter  - 1-2 x daily - 7 x weekly - 1 sets - 3 reps - 10-15 hold - Supine Lower Trunk Rotation  - 1 x daily - 7 x  weekly - 1 sets - 3-5 reps - 10 hold - TL Sidebending Stretch - Single Arm Overhead  - 1-2 x daily - 7 x weekly - 1 sets - 3 reps - 10 hold Education details: Continued discussion of posture and added Meeks Decompression Exs  Person educated: Patient Education method: Explanation, demonstration, VC's and handout issued Education comprehension: verbalized understanding, returned demo and will benefit from further review  HOME EXERCISE PROGRAM: Access Code: [email or web address] URL: [email or web address] Date: 07/05/2024 Prepared by: Florina Lanis Carbon  Exercises - Seated Cervical Retraction  - 1 x daily - 7 x weekly - 1 sets - 10 reps - 3-5 sec hold - Seated Scapular Retraction  - 1 x daily - 7 x weekly - 1 sets - 10 reps - 3-5 sec hold  07/06/24 - Meeks Decompression Exs  07/14/24- supine scapular series  ASSESSMENT:  CLINICAL IMPRESSION: Pts shoulder pain overall improved, but she still feels tingling in the  hand, and has limitations in shoulder ROM. Good stretch felt with counter stretch and SB to the right, as well as LTR and they were added to HEP. Pecs and lateral trunk tight and  tender. Did well with standing postural exercises, although ER was more fatiguing and was advised she can do with yellow at home. Pics of band were not added to HEP due to forgotten.    OBJECTIVE IMPAIRMENTS: decreased knowledge of condition, decreased strength, increased fascial restrictions, increased muscle spasms, impaired UE functional use, postural dysfunction, and pain.   ACTIVITY LIMITATIONS: carrying, lifting, and reach over head  PARTICIPATION LIMITATIONS: cleaning and laundry  PERSONAL FACTORS: Past/current experiences hx of radiation are also affecting patient's functional outcome.   REHAB POTENTIAL: Good  CLINICAL DECISION MAKING: Stable/uncomplicated  EVALUATION COMPLEXITY: Low  GOALS: Goals reviewed with patient? Yes   LONG TERM GOALS=SHORT TERM GOALS Target date:  08/02/24  Pt will report a 75% improvement in pain in LUE to allow pt improved comfort and function. Baseline:  Goal status: 60% improvement  2.  Pt will demonstrate 150 degrees of L shoulder flexion to allow her to reach up overhead. Baseline:  Goal status: MET 08/02/24- 159 degrees  3.  Pt will demonstrate 160 degrees of L shoulder abduction to allow her to reach to the side. Baseline:  Goal status: MET 08/02/24 - 170 degrees  4.  Pt will be independent in a home exercise program to continue to improve posture and postural awareness. Baseline:  Goal status: ONGOING  PLAN:  PT FREQUENCY: 2x/week  PT DURATION: 4 weeks  PLANNED INTERVENTIONS: 97164- PT Re-evaluation, 97110-Therapeutic exercises, 97530- Therapeutic activity, 97112- Neuromuscular re-education, 97535- Self Care, [id number]- Manual therapy, [phone]- Orthotic Initial, [phone]- Orthotic/Prosthetic subsequent, Patient/Family education, Balance training, Joint mobilization, Therapeutic exercises, Therapeutic activity, Neuromuscular re-education, Gait training, and Self Care  PLAN FOR NEXT SESSION: add postural band exs to HEP,MFR/STM to lateral edge of lat, Cont postural stability finding exercises that decrease Lt UE symptoms; P/ROM and STM to L neck and shoulders especially upper traps, levator, posture exercises, L shoulder ROM  Grayce JINNY Sheldon, PT 08/16/2024, 4:15 PM   1. Decompression Exercise     Cancer Rehab [phone]    Lie on back on firm surface, knees bent, feet flat, arms turned up, out to sides, backs of hands down. Time _5-15__ minutes. Surface: floor   2. Shoulder Press    Start in Decompression Exercise position. Press shoulders downward towards supporting surface. Hold __2-3__ seconds while counting out loud. Repeat _3-5___ times. Do _1-2___ times per day.   3. Head Press    Bring cervical spine (neck) into neutral position (by either tucking the chin towards the chest or tilting the chin upward). Feel  weight on back of head. Press head downward into supporting surface.    Hold _2-3__ seconds. Repeat _3-5__ times. Do _1-2__ times per day.   4. Leg Lengthener    Straighten one leg. Pull toes AND forefoot toward knee, extend heel. Lengthen leg by pulling pelvis away from ribs. Hold _2-3__ seconds. Relax. Repeat __4-6__ times. Do other leg.  Surface: floor   5. Leg Press    Straighten one leg down to floor keeping leg aligned with hip. Pull toes AND forefoot toward knee; extend heel.  Press entire leg downward (as if pressing leg into sandy beach). DO NOT BEND KNEE. Hold _2-3__ seconds. Do __4-6__ times. Repeat with other leg.  "

## 2024-08-18 ENCOUNTER — Other Ambulatory Visit: Payer: Self-pay

## 2024-08-18 ENCOUNTER — Other Ambulatory Visit: Payer: Self-pay | Admitting: Pharmacy Technician

## 2024-08-18 ENCOUNTER — Ambulatory Visit: Admitting: Physical Therapy

## 2024-08-18 ENCOUNTER — Encounter: Payer: Self-pay | Admitting: Physical Therapy

## 2024-08-18 ENCOUNTER — Other Ambulatory Visit (HOSPITAL_COMMUNITY): Payer: Self-pay

## 2024-08-18 DIAGNOSIS — M79602 Pain in left arm: Secondary | ICD-10-CM

## 2024-08-18 DIAGNOSIS — M25612 Stiffness of left shoulder, not elsewhere classified: Secondary | ICD-10-CM

## 2024-08-18 DIAGNOSIS — C50412 Malignant neoplasm of upper-outer quadrant of left female breast: Secondary | ICD-10-CM

## 2024-08-18 DIAGNOSIS — R293 Abnormal posture: Secondary | ICD-10-CM

## 2024-08-18 NOTE — Therapy (Signed)
 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   Family history of breast cancer 03/20/2022   Family history of pancreatic cancer 03/20/2022   HIV infection (HCC) 1993   Hypertension    Past Surgical History:  Procedure Laterality Date   ABDOMINAL HYSTERECTOMY     APPENDECTOMY     BREAST BIOPSY Right 2015   BREAST BIOPSY Left 08/26/2022   US  LT RADIOACTIVE SEED LOC 08/26/2022 [facility]   BREAST BIOPSY  08/26/2022   MM LT RADIOACTIVE SEED LOC MAMMO GUIDE 08/26/2022 [facility]   BREAST LUMPECTOMY WITH RADIOACTIVE SEED LOCALIZATION Left 08/27/2022   Procedure: LEFT BREAST LUMPECTOMY WITH RADIOACTIVE SEED LOCALIZATION;  Surgeon: Vernetta Berg, MD;  Location: [facility];  Service: General;  Laterality: Left;   CYSTOSCOPY/RETROGRADE/URETEROSCOPY Right 11/14/2016   Procedure: CYSTOSCOPY/RETROGRADE/URETEROSCOPY/ STONE EXTRACTION/HOLMIUM LASER/STENT PLACEMENT;  Surgeon: Mark Ottelin, MD;  Location: [facility];  Service: Urology;  Laterality: Right;   PORT-A-CATH REMOVAL Right 08/27/2022   Procedure: REMOVAL PORT-A-CATH;  Surgeon: Vernetta Berg, MD;  Location: [facility];  Service: General;  Laterality: Right;   PORTACATH PLACEMENT Right 03/26/2022   Procedure: INSERTION PORT-A-CATH;  Surgeon: Vernetta Berg, MD;  Location: [facility];  Service: General;  Laterality:  Right;   RADIOACTIVE SEED GUIDED AXILLARY SENTINEL LYMPH NODE Left 08/27/2022   Procedure: RADIOACTIVE SEED GUIDED LEFT AXILLARY SENTINEL LYMPH NODE DISSECTION;  Surgeon: Vernetta Berg, MD;  Location: [facility];  Service: General;  Laterality: Left;   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   HYPERTENSION, BENIGN 01/26/2007   SHINGLES, RECURRENT 12/08/2006   HIV disease (HCC) 10/26/2006   HX, PERSONAL, PENICILLIN ALLERGY 10/26/2006   HX, PERSONAL, DRUG ALLERGY NOS 10/26/2006    PCP: Raguel Blush, MD  REFERRING PROVIDER: Odean Potts, MD  REFERRING DIAG:  C50.412,Z17.0 (ICD-10-CM) - Malignant neoplasm of upper-outer quadrant of left breast in female, estrogen receptor positive (HCC)  THERAPY DIAG:  Pain in left arm  Stiffness of left shoulder, not elsewhere classified  Abnormal posture  Malignant neoplasm of upper-outer quadrant of left breast in female, estrogen receptor positive (HCC)  ONSET DATE: 06/14/24  Rationale for Evaluation and Treatment: Rehabilitation  SUBJECTIVE:  SUBJECTIVE STATEMENT:  The tenderness is doing better.   PERTINENT HISTORY: Post left lumpectomy 08/27/22.  3 of 4 lymph nodes positive. Neoadjuvant chemotherapy completed and radiation. It is ER  positive, PR negative, and HER2 negative with a Ki67 of 10%. She has a positive axillary node. HIV  PAIN:  Are you having pain?  PAIN:  Are you having pain? No, just tingling NPRS scale:  Pain location: left hand Pain orientation: Left  PAIN TYPE: tingling Pain description: intermittent  Aggravating factors: random times Relieving factors: tylenol   PRECAUTIONS: Other: at risk of lymphedema  RED FLAGS: None   WEIGHT BEARING RESTRICTIONS: No  FALLS:  Has patient fallen in last 6 months? No  LIVING ENVIRONMENT: Lives with: brother Lives in: House/apartment Stairs: No;  Has following equipment at home: None  OCCUPATION: retired  LEISURE: active with her grandkids, does arm exercises, walks, leg exercises  HAND DOMINANCE: right   PRIOR LEVEL OF FUNCTION: Independent  PATIENT GOALS: to decrease the pain   OBJECTIVE: Note: Objective measures were completed at Evaluation unless otherwise noted.  COGNITION: Overall cognitive status: Within functional limits for tasks assessed   PALPATION: Increased muscle tightness noted in L upper traps, scalenes, lateral and posterior cervical muscles  OBSERVATIONS / OTHER ASSESSMENTS: very mild edema present at posterior wrist and 2nd digit on L  POSTURE: forward head, rounded shoulders  UPPER EXTREMITY AROM/PROM:  A/PROM RIGHT   eval   Shoulder extension 69  Shoulder flexion 154  Shoulder abduction 172  Shoulder internal rotation 51  Shoulder external rotation 97    (Blank rows = not tested)  A/PROM LEFT   eval LEFT 08/03/23  Shoulder extension 61   Shoulder flexion 141 159  Shoulder abduction 154 170  Shoulder internal rotation 64   Shoulder external rotation 85     (Blank rows = not tested)  CERVICAL AROM: All within normal limits:    Percent limited  Flexion WFL  Extension WFL  Right lateral flexion WFL  Left lateral flexion WFL  Right rotation WFL  Left rotation WFL    UPPER EXTREMITY STRENGTH:  Shoulders 4/5  LYMPHEDEMA ASSESSMENTS:   SURGERY TYPE/DATE: L lumpectomy 08/27/22  NUMBER OF LYMPH NODES REMOVED: 3 of 4 nodes positive  CHEMOTHERAPY: completed  RADIATION:completed  HORMONE TREATMENT: yes anastrozole   INFECTIONS: none   LYMPHEDEMA ASSESSMENTS:   LANDMARK RIGHT  eval  At axilla  31  15 cm proximal to the proximal aspect of the olecranon process 27.5  10 cm proximal to the proximal aspect of the olecranon process 25  Olecranon process 23  15 cm proximal to the proximal aspect of the ulnar styloid process 22  10 cm proximal to the proximal aspect of the ulnar styloid process 19.5  Just distal to the ulnar styloid process 15  Across hand at thumb web space 18  At base of 2nd digit 6.3  (Blank rows = not tested)  LANDMARK LEFT  eval  At axilla  30.5  15 cm proximal to the proximal aspect of the olecranon process 26.9  10 cm proximal to the proximal aspect of the olecranon process 25.5  Olecranon process 22.5  15 cm proximal to the proximal aspect of the ulnar styloid process 21.2  10 cm proximal to  the proximal aspect of the ulnar styloid process 18.5  Just distal to the ulnar styloid process 15.6  Across hand at thumb web space 18  At base of 2nd digit 6.3  (Blank rows = not tested)  Chest circumference just inferior to the axillae:  Chest circumference at the largest point:         L-DEX LYMPHEDEMA SCREENING: The patient was assessed using the L-Dex machine today to produce a lymphedema index baseline score. The patient will be reassessed on a regular basis (typically every 3 months) to obtain new L-Dex scores. If the score is > 6.5 points away from his/her baseline score indicating onset of subclinical lymphedema, it will be recommended to wear a compression garment for 4 weeks, 12 hours per day and then be reassessed. If the score continues to be > 6.5 points from baseline at reassessment, we will initiate lymphedema treatment. Assessing in this  manner has a 95% rate of preventing clinically significant lymphedema.  QUICK DASH SURVEY:                                                                                                                                TREATMENT DATE:  08/18/2024 Discussed progress Pulleys x 2 min flexion and abduction Ball rolls on wall x 10 flexion and left abd. X 10 Standing postural exercises with red band; scapular retraction, shoulder extension, L ER x 10 ea Wall walk to the right to stretch left lateral trunk x 5 with stretch felt today Lower trunk rotation x 7 reps bilaterally MANUAL STM with cocoa butter using wave tool to left UT/levator and lateral trunk/scapular areas of tenderness in R sidelying and then in supine to L pec   08/16/2024 Discussed progress Pulleys x 2 min flexion and abduction Ball rolls on wall x 10 flexion and left abd. X 8 Standing postural exercises with red band; scapular retraction, shoulder extension, Bilateral ER x 10 ea Wall walk to the right to stretch left lateral trunk; no real stretch felt so tried standing SB stretch to the right x 3 with arm overhead and good stretch felt. Standing lat stretch at laundry cart x 4, 10 sec holds Supine snow angels x 7 Lower trunk rotation x 4-5 reps bilaterally Updated HEP MANUAL STM with cocoa butter to left UT/levator and lateral trunk/scapular areas of tenderness in supine PROM left shoulder flex, scaption, abd, ER  08/09/2024 Discussed progress  Therapeutic Exercises Pulleys into flex x 3 min and then abd x 2 min  Roll yellow ball up wall into flex x 10, then Lt UE abd x 10 returning therapist demo Therapeutic Activities  Supine on large mat table on 1 pillow: supine scapular series with red band x 10 reps as follows with pt returning therapist demo: narrow and wide grip flexion, horizontal abduction, diagonals, and ER Supine on large mat table: Meeks Decompression x 5 reps, 5 sec holds.  MANUAL STM with cocoa butter  to left UT/levator and lateral trunk/scapular areas of tenderness in supine   08/02/24: Assessed progress towards goals in therapy Therapeutic Exercises Pulleys into flex and then abd x 2 min each, pt reports  Roll yellow ball  up wall into flex x 10, then Lt UE abd x 10 returning therapist demo Therapeutic Activities  Supine on large mat table on 1 pillow: supine scapular series with red band x 10 reps as follows with pt returning therapist demo: narrow and wide grip flexion, horizontal abduction, diagonals, and ER Supine on large mat table: Meeks Decompression x 5 reps, 5 sec holds. Pt able to lay with 1 pillow but did have some tingling in the arm   07/29/24: Therapeutic Exercises Pulleys into flex and then abd x 2 min each, pt reports  Roll yellow ball up wall into flex x 10, then Lt UE abd x 6 returning therapist demo Therapeutic Activities  Supine over half foam roll with 1 pillow but removed 1/2 way throughout due to discomfort in back: supine scapular series with red band x 10 reps as follows with pt returning therapist demo: narrow and wide grip flexion, horizontal abduction, diagonals, and ER Supine on large mat table: Meeks Decompression x 5 reps, 5 sec holds. Pt able to lay with 1 pillow but did have some tingling in the arm  Manual Therapy In R sidelying: using cocoa butter STM to L serratus and edge of lats where pt has trigger points with decreased muscle tension noted by end of session  07/26/24: Therapeutic Exercises Pulleys into flex and then abd x 2 min each, pt reports  Roll yellow ball up wall into flex x 10, then Lt UE abd x 6 returning therapist demo Therapeutic Activities  Supine over half foam roll with 1 pillow as pt wanted to try again but did decrease reps: Bil horz abd x 5, bil UE scaption into a V x 5, then bil UE abd in a snow angel x 5; pt reports no pain today Supine on large mat table: Meeks Decompression x 5 reps, 5 sec holds. Pt able to lay with 1 pillow  but did have some tingling in the arm  Seated in chair with head against purple ball on wall for following: Cervical retraction x 10 reps with 5 sec holds reviewing proper technique again as pt was performing thoracic ext with cervical retraction, then cervical retraction with holding 1# dumb bell: alternating flexion x 10, bilateral scaption x 10  Manual Therapy Gentle cervical traction with suboccipital release STM in supine to L SCM, upper traps, levator, paraspinals and edge of lat where there is a larger trigger point   07/20/24: Therapeutic Exercises Pulleys into flex and then abd x 2 min each, pt reports  Roll yellow ball up wall into flex x 10, then Lt UE abd x 6 returning therapist demo Therapeutic Activities  Supine over half foam roll with 1 pillow as pt wanted to try again but did decrease reps: Bil horz abd x 5, bil UE scaption into a V x 5, then bil UE abd in a snow angel x 5; pt reports no pain today, just discomfort from new position of stretches on foam Supine on large mat table: Meeks Decompression x 5 reps, 5 sec holds. Pt able to lay with 1 pillow but did have tingling in the arm start with these, but probable due to new exs on foam roll.  Seated in chair with head against purple ball on wall for following: Cervical retraction x 10 reps with 5 sec holds reviewing proper technique as pt was performing thoracic ext with cervical retraction, then cervical retraction with holding 1# dumb bell: alternating flexion x 10, bilateral scaption x 10  Manual Therapy Gentle cervical traction with suboccipital release STM in supine to L SCM, upper traps, levator, paraspinals with cocoa butter P/ROM into cervical bil side bending and rotation with and without overpressure to clavicle for increased stretch  07/14/24: Therapeutic Exercises Pulleys into flex and then abd x 2 min each returning therapist demo and VC's to relax shoulder during  Roll yellow ball up wall into flex x 5 with  feeling of stretching Therapeutic Activities  Supine on large mat table:  Meeks decompression exercises with 5 sec holds x 5 each - able to do without increased tingling in arm with only 1 pillow under head, started having tingling while showing pt supine scap so therapist added 2nd pillow which alleviated tingling, overhead narrow and wide grip flexion x 7 reps each, supine shoulder ER with yellow band x 7,  horizontal abduction x 7, diagonals x 7 -with pt returning therapist demo, issued as part of HEP Seated in chair with head against purple ball on wall for following: Cervical retraction x 10 reps with 5 sec holds returning therapist demo, then cervical retraction with: alternating flexion x 10, bilateral scaption x 10  Manual Therapy Gentle cervical traction with suboccipital release STM in supine to L SCM, upper traps, levator, paraspinals   07/12/24: Therapeutic Exercises Pulleys into flex and then abd x 2 min each returning therapist demo and VC's to relax shoulder during  Roll yellow ball up wall into flex x 5 with feeling of stretching Therapeutic Activities  Supine on large mat table:  Meeks decompression exercises with 5 sec holds x 5 each - had to add pillow under head due to increased arm tingling and pain and this helped some - therapist also performed gentle STM to L shoulder/neck to help decrease discomfort, supine shoulder ER with yellow band x 7, supine alternating flexion x 7, supine scaption x 10  Seated in chair with head against purple ball on wall for following: Cervical retraction x 10 reps with 5 sec holds returning therapist demo, then cervical retraction with: alternating flexion x 10, bilateral scaption x 10  Manual Therapy Gentle cervical traction with suboccipital release STM in supine to L SCM, upper traps, levator, paraspinals and then to R side lying to the same and also rhomboids using cocoa butter and wave tool- pt reported relief after this      PATIENT  EDUCATION:  Access Code: [email or web address] URL: [email or web address] Date: 08/16/2024 Prepared by: Grayce Sheldon  Exercises - Standing 'L' Stretch at Counter  - 1-2 x daily - 7 x weekly - 1 sets - 3 reps - 10-15 hold - Supine Lower Trunk Rotation  - 1 x daily - 7 x weekly - 1 sets - 3-5 reps - 10 hold - TL Sidebending Stretch - Single Arm Overhead  - 1-2 x daily - 7 x weekly - 1 sets - 3 reps - 10 hold Education details: Continued discussion of posture and added Meeks Decompression Exs  Person educated: Patient Education method: Explanation, demonstration, VC's and handout issued Education comprehension: verbalized understanding, returned demo and will benefit from further review  HOME EXERCISE PROGRAM: Access Code: [email or web address] URL: [email or web address] Date: 07/05/2024 Prepared by: Florina Lanis Carbon  Exercises - Seated Cervical Retraction  - 1 x daily - 7 x weekly - 1 sets - 10 reps - 3-5 sec hold - Seated Scapular Retraction  - 1 x daily - 7 x weekly - 1 sets - 10 reps - 3-5 sec hold  07/06/24 - Meeks Decompression  Exs  07/14/24- supine scapular series  ASSESSMENT:  CLINICAL IMPRESSION: Pt reports the manual therapy has really been helping her. Continued with standing scapular strengthening exercises with pt demonstrating good form with minimal cueing. Continued with manual therapy to areas of tightness in L lats, serratus, rhomboids, UT and levator as well as L pec.    OBJECTIVE IMPAIRMENTS: decreased knowledge of condition, decreased strength, increased fascial restrictions, increased muscle spasms, impaired UE functional use, postural dysfunction, and pain.   ACTIVITY LIMITATIONS: carrying, lifting, and reach over head  PARTICIPATION LIMITATIONS: cleaning and laundry  PERSONAL FACTORS: Past/current experiences hx of radiation are also affecting patient's functional outcome.   REHAB POTENTIAL: Good  CLINICAL DECISION MAKING:  Stable/uncomplicated  EVALUATION COMPLEXITY: Low  GOALS: Goals reviewed with patient? Yes   LONG TERM GOALS=SHORT TERM GOALS Target date: 08/02/24  Pt will report a 75% improvement in pain in LUE to allow pt improved comfort and function. Baseline:  Goal status: 60% improvement  2.  Pt will demonstrate 150 degrees of L shoulder flexion to allow her to reach up overhead. Baseline:  Goal status: MET 08/02/24- 159 degrees  3.  Pt will demonstrate 160 degrees of L shoulder abduction to allow her to reach to the side. Baseline:  Goal status: MET 08/02/24 - 170 degrees  4.  Pt will be independent in a home exercise program to continue to improve posture and postural awareness. Baseline:  Goal status: ONGOING  PLAN:  PT FREQUENCY: 2x/week  PT DURATION: 4 weeks  PLANNED INTERVENTIONS: 97164- PT Re-evaluation, 97110-Therapeutic exercises, 97530- Therapeutic activity, 97112- Neuromuscular re-education, 97535- Self Care, [id number]- Manual therapy, [phone]- Orthotic Initial, [phone]- Orthotic/Prosthetic subsequent, Patient/Family education, Balance training, Joint mobilization, Therapeutic exercises, Therapeutic activity, Neuromuscular re-education, Gait training, and Self Care  PLAN FOR NEXT SESSION: add postural band exs to HEP,MFR/STM to lateral edge of lat, Cont postural stability finding exercises that decrease Lt UE symptoms; P/ROM and STM to L neck and shoulders especially upper traps, levator, posture exercises, L shoulder ROM  [employer], PT 08/18/2024, 3:59 PM   1. Decompression Exercise     Cancer Rehab [phone]    Lie on back on firm surface, knees bent, feet flat, arms turned up, out to sides, backs of hands down. Time _5-15__ minutes. Surface: floor   2. Shoulder Press    Start in Decompression Exercise position. Press shoulders downward towards supporting surface. Hold __2-3__ seconds while counting out loud. Repeat _3-5___ times. Do _1-2___ times per day.   3.  Head Press    Bring cervical spine (neck) into neutral position (by either tucking the chin towards the chest or tilting the chin upward). Feel weight on back of head. Press head downward into supporting surface.    Hold _2-3__ seconds. Repeat _3-5__ times. Do _1-2__ times per day.   4. Leg Lengthener    Straighten one leg. Pull toes AND forefoot toward knee, extend heel. Lengthen leg by pulling pelvis away from ribs. Hold _2-3__ seconds. Relax. Repeat __4-6__ times. Do other leg.  Surface: floor   5. Leg Press    Straighten one leg down to floor keeping leg aligned with hip. Pull toes AND forefoot toward knee; extend heel.  Press entire leg downward (as if pressing leg into sandy beach). DO NOT BEND KNEE. Hold _2-3__ seconds. Do __4-6__ times. Repeat with other leg.  "

## 2024-08-19 ENCOUNTER — Other Ambulatory Visit: Payer: Self-pay

## 2024-08-19 ENCOUNTER — Other Ambulatory Visit (HOSPITAL_COMMUNITY): Payer: Self-pay

## 2024-08-19 ENCOUNTER — Telehealth: Payer: Self-pay | Admitting: Internal Medicine

## 2024-08-19 DIAGNOSIS — B20 Human immunodeficiency virus [HIV] disease: Secondary | ICD-10-CM

## 2024-08-19 MED ORDER — RUKOBIA 600 MG PO TB12: 600.0000 mg | ORAL_TABLET | Freq: Two times a day (BID) | ORAL | 5 refills | Status: AC

## 2024-08-19 MED ORDER — ENSURE PO LIQD: 237.0000 mL | Freq: Two times a day (BID) | ORAL | 11 refills | Status: DC

## 2024-08-19 MED ORDER — DESCOVY 200-25 MG PO TABS: 1.0000 | ORAL_TABLET | Freq: Every day | ORAL | 5 refills | Status: AC

## 2024-08-19 NOTE — Addendum Note (Signed)
 Addended by: FLORENE BOUCHARD D on: 08/19/2024 11:00 AM   Modules accepted: Orders

## 2024-08-19 NOTE — Progress Notes (Signed)
 Patient is enrolled in SPAP program and needs to fill with [facility] due to this patient will still fill [facility]  here at [facility] .

## 2024-08-19 NOTE — Telephone Encounter (Signed)
 Veronica Robles stating someone called her and she requested a call back. I did not see any documentation of a call and it is too soon for appt reminder. Pt can be reached at [phone].

## 2024-08-19 NOTE — Addendum Note (Signed)
 Addended by: FRANCYNE CODE, Falynn Ailey M on: 08/19/2024 11:14 AM   Modules accepted: Orders

## 2024-08-19 NOTE — Progress Notes (Signed)
 Ensure ordered in error, print Rx shredded.   Shaquana Buel, BSN, RN

## 2024-08-23 ENCOUNTER — Ambulatory Visit: Admitting: Physical Therapy

## 2024-08-23 ENCOUNTER — Encounter: Payer: Self-pay | Admitting: Physical Therapy

## 2024-08-23 DIAGNOSIS — M79602 Pain in left arm: Secondary | ICD-10-CM | POA: Diagnosis not present

## 2024-08-23 DIAGNOSIS — C50412 Malignant neoplasm of upper-outer quadrant of left female breast: Secondary | ICD-10-CM

## 2024-08-23 DIAGNOSIS — M25612 Stiffness of left shoulder, not elsewhere classified: Secondary | ICD-10-CM

## 2024-08-23 DIAGNOSIS — R293 Abnormal posture: Secondary | ICD-10-CM

## 2024-08-23 NOTE — Therapy (Signed)
 " OUTPATIENT PHYSICAL THERAPY  UPPER EXTREMITY ONCOLOGY TREATMENT  Patient Name: Veronica Robles MRN: [id number] DOB:[birth year], 69 y.o., female Today's Date: 08/23/2024  END OF SESSION:  PT End of Session - 08/23/24 1508     Visit Number 12    Number of Visits 16    Date for Recertification  08/30/24    Authorization Type None needed    PT Start Time 1506    PT Stop Time 1555    PT Time Calculation (min) 49 min    Activity Tolerance Patient tolerated treatment well    Behavior During Therapy [facility] 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   Family history of breast cancer 03/20/2022   Family history of pancreatic cancer 03/20/2022   HIV infection (HCC) 1993   Hypertension    Past Surgical History:  Procedure Laterality Date   ABDOMINAL HYSTERECTOMY     APPENDECTOMY     BREAST BIOPSY Right 2015   BREAST BIOPSY Left 08/26/2022   US  LT RADIOACTIVE SEED LOC 08/26/2022 [facility]   BREAST BIOPSY  08/26/2022   MM LT RADIOACTIVE SEED LOC MAMMO GUIDE 08/26/2022 [facility]   BREAST LUMPECTOMY WITH RADIOACTIVE SEED LOCALIZATION Left 08/27/2022   Procedure: LEFT BREAST LUMPECTOMY WITH RADIOACTIVE SEED LOCALIZATION;  Surgeon: Vernetta Berg, MD;  Location: [facility];  Service: General;  Laterality: Left;   CYSTOSCOPY/RETROGRADE/URETEROSCOPY Right 11/14/2016   Procedure: CYSTOSCOPY/RETROGRADE/URETEROSCOPY/ STONE EXTRACTION/HOLMIUM LASER/STENT PLACEMENT;  Surgeon: Mark Ottelin, MD;  Location: [facility];  Service: Urology;  Laterality: Right;   PORT-A-CATH REMOVAL Right 08/27/2022   Procedure: REMOVAL PORT-A-CATH;  Surgeon: Vernetta Berg, MD;  Location: [facility];  Service: General;  Laterality: Right;   PORTACATH PLACEMENT Right 03/26/2022   Procedure: INSERTION PORT-A-CATH;  Surgeon: Vernetta Berg, MD;  Location: [facility];  Service: General;  Laterality: Right;   RADIOACTIVE SEED GUIDED AXILLARY SENTINEL LYMPH NODE Left 08/27/2022   Procedure: RADIOACTIVE SEED GUIDED LEFT AXILLARY SENTINEL LYMPH NODE DISSECTION;  Surgeon: Vernetta Berg, MD;  Location: [facility];  Service: General;  Laterality: Left;   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   HYPERTENSION, BENIGN 01/26/2007   SHINGLES, RECURRENT 12/08/2006   HIV disease (HCC) 10/26/2006   HX, PERSONAL, PENICILLIN ALLERGY 10/26/2006   HX, PERSONAL, DRUG ALLERGY NOS 10/26/2006    PCP: Raguel Blush, MD  REFERRING PROVIDER: Odean Potts, MD  REFERRING DIAG:  C50.412,Z17.0 (ICD-10-CM) - Malignant neoplasm of upper-outer quadrant of left breast in female, estrogen receptor positive (HCC)  THERAPY DIAG:  Pain in left arm  Stiffness of left shoulder, not elsewhere classified  Abnormal posture  Malignant neoplasm of upper-outer quadrant of left breast in female, estrogen receptor positive (HCC)  ONSET DATE: 06/14/24  Rationale for Evaluation and Treatment: Rehabilitation  SUBJECTIVE:  SUBJECTIVE STATEMENT:  I felt pretty good after last session.   PERTINENT HISTORY: Post left lumpectomy 08/27/22.  3 of 4 lymph nodes positive. Neoadjuvant  chemotherapy completed and radiation. It is ER positive, PR negative, and HER2 negative with a Ki67 of 10%. She has a positive axillary node. HIV  PAIN:  Are you having pain?  PAIN:  Are you having pain? No, just tingling NPRS scale:  Pain location: left hand Pain orientation: Left  PAIN TYPE: tingling Pain description: intermittent  Aggravating factors: random times Relieving factors: tylenol   PRECAUTIONS: Other: at risk of lymphedema  RED FLAGS: None   WEIGHT BEARING RESTRICTIONS: No  FALLS:  Has patient fallen in last 6 months? No  LIVING ENVIRONMENT: Lives with: brother Lives in: House/apartment Stairs: No;  Has following equipment at home: None  OCCUPATION: retired  LEISURE: active with her grandkids, does arm exercises, walks, leg exercises  HAND DOMINANCE: right   PRIOR LEVEL OF FUNCTION: Independent  PATIENT GOALS: to decrease the pain   OBJECTIVE: Note: Objective measures were completed at Evaluation unless otherwise noted.  COGNITION: Overall cognitive status: Within functional limits for tasks assessed   PALPATION: Increased muscle tightness noted in L upper traps, scalenes, lateral and posterior cervical muscles  OBSERVATIONS / OTHER ASSESSMENTS: very mild edema present at posterior wrist and 2nd digit on L  POSTURE: forward head, rounded shoulders  UPPER EXTREMITY AROM/PROM:  A/PROM RIGHT   eval   Shoulder extension 69  Shoulder flexion 154  Shoulder abduction 172  Shoulder internal rotation 51  Shoulder external rotation 97    (Blank rows = not tested)  A/PROM LEFT   eval LEFT 08/03/23  Shoulder extension 61   Shoulder flexion 141 159  Shoulder abduction 154 170  Shoulder internal rotation 64   Shoulder external rotation 85     (Blank rows = not tested)  CERVICAL AROM: All within normal limits:    Percent limited  Flexion WFL  Extension WFL  Right lateral flexion WFL  Left lateral flexion WFL  Right rotation WFL  Left  rotation WFL    UPPER EXTREMITY STRENGTH: Shoulders 4/5  LYMPHEDEMA ASSESSMENTS:   SURGERY TYPE/DATE: L lumpectomy 08/27/22  NUMBER OF LYMPH NODES REMOVED: 3 of 4 nodes positive  CHEMOTHERAPY: completed  RADIATION:completed  HORMONE TREATMENT: yes anastrozole   INFECTIONS: none   LYMPHEDEMA ASSESSMENTS:   LANDMARK RIGHT  eval  At axilla  31  15 cm proximal to the proximal aspect of the olecranon process 27.5  10 cm proximal to the proximal aspect of the olecranon process 25  Olecranon process 23  15 cm proximal to the proximal aspect of the ulnar styloid process 22  10 cm proximal to the proximal aspect of the ulnar styloid process 19.5  Just distal to the ulnar styloid process 15  Across hand at thumb web space 18  At base of 2nd digit 6.3  (Blank rows = not tested)  LANDMARK LEFT  eval  At axilla  30.5  15 cm proximal to the proximal aspect of the olecranon process 26.9  10 cm proximal to the proximal aspect of the olecranon process 25.5  Olecranon process 22.5  15 cm proximal to the proximal aspect of the ulnar styloid process 21.2  10 cm proximal to  the proximal aspect of the ulnar styloid process 18.5  Just distal to the ulnar styloid process 15.6  Across hand at thumb web space 18  At base of 2nd digit 6.3  (Blank rows =  not tested)  Chest circumference just inferior to the axillae:  Chest circumference at the largest point:         L-DEX LYMPHEDEMA SCREENING: The patient was assessed using the L-Dex machine today to produce a lymphedema index baseline score. The patient will be reassessed on a regular basis (typically every 3 months) to obtain new L-Dex scores. If the score is > 6.5 points away from his/her baseline score indicating onset of subclinical lymphedema, it will be recommended to wear a compression garment for 4 weeks, 12 hours per day and then be reassessed. If the score continues to be > 6.5 points from baseline at reassessment, we will  initiate lymphedema treatment. Assessing in this manner has a 95% rate of preventing clinically significant lymphedema.  QUICK DASH SURVEY:                                                                                                                                TREATMENT DATE:  08/23/2024 Pulleys x 2 min flexion and abduction Ball rolls on wall x 10 flexion and left abd. X 10 MANUAL In R side lying: STM using cocoa butter and WAVE tool to L serratus and edge of lats where there were numerous trigger points as well as rhomboids, UT and levator  08/18/2024 Discussed progress Pulleys x 2 min flexion and abduction Ball rolls on wall x 10 flexion and left abd. X 10 Standing postural exercises with red band; scapular retraction, shoulder extension, L ER x 10 ea Wall walk to the right to stretch left lateral trunk x 5 with stretch felt today Lower trunk rotation x 7 reps bilaterally MANUAL STM with cocoa butter using wave tool to left UT/levator and lateral trunk/scapular areas of tenderness in R sidelying and then in supine to L pec   08/16/2024 Discussed progress Pulleys x 2 min flexion and abduction Ball rolls on wall x 10 flexion and left abd. X 8 Standing postural exercises with red band; scapular retraction, shoulder extension, Bilateral ER x 10 ea Wall walk to the right to stretch left lateral trunk; no real stretch felt so tried standing SB stretch to the right x 3 with arm overhead and good stretch felt. Standing lat stretch at laundry cart x 4, 10 sec holds Supine snow angels x 7 Lower trunk rotation x 4-5 reps bilaterally Updated HEP MANUAL STM with cocoa butter to left UT/levator and lateral trunk/scapular areas of tenderness in supine PROM left shoulder flex, scaption, abd, ER  08/09/2024 Discussed progress  Therapeutic Exercises Pulleys into flex x 3 min and then abd x 2 min  Roll yellow ball up wall into flex x 10, then Lt UE abd x 10 returning therapist  demo Therapeutic Activities  Supine on large mat table on 1 pillow: supine scapular series with red band x 10 reps as follows with pt returning therapist demo: narrow and wide grip flexion, horizontal abduction, diagonals, and ER Supine  on large mat table: Meeks Decompression x 5 reps, 5 sec holds.  MANUAL STM with cocoa butter to left UT/levator and lateral trunk/scapular areas of tenderness in supine   08/02/24: Assessed progress towards goals in therapy Therapeutic Exercises Pulleys into flex and then abd x 2 min each, pt reports  Roll yellow ball up wall into flex x 10, then Lt UE abd x 10 returning therapist demo Therapeutic Activities  Supine on large mat table on 1 pillow: supine scapular series with red band x 10 reps as follows with pt returning therapist demo: narrow and wide grip flexion, horizontal abduction, diagonals, and ER Supine on large mat table: Meeks Decompression x 5 reps, 5 sec holds. Pt able to lay with 1 pillow but did have some tingling in the arm   07/29/24: Therapeutic Exercises Pulleys into flex and then abd x 2 min each, pt reports  Roll yellow ball up wall into flex x 10, then Lt UE abd x 6 returning therapist demo Therapeutic Activities  Supine over half foam roll with 1 pillow but removed 1/2 way throughout due to discomfort in back: supine scapular series with red band x 10 reps as follows with pt returning therapist demo: narrow and wide grip flexion, horizontal abduction, diagonals, and ER Supine on large mat table: Meeks Decompression x 5 reps, 5 sec holds. Pt able to lay with 1 pillow but did have some tingling in the arm  Manual Therapy In R sidelying: using cocoa butter STM to L serratus and edge of lats where pt has trigger points with decreased muscle tension noted by end of session  07/26/24: Therapeutic Exercises Pulleys into flex and then abd x 2 min each, pt reports  Roll yellow ball up wall into flex x 10, then Lt UE abd x 6 returning therapist  demo Therapeutic Activities  Supine over half foam roll with 1 pillow as pt wanted to try again but did decrease reps: Bil horz abd x 5, bil UE scaption into a V x 5, then bil UE abd in a snow angel x 5; pt reports no pain today Supine on large mat table: Meeks Decompression x 5 reps, 5 sec holds. Pt able to lay with 1 pillow but did have some tingling in the arm  Seated in chair with head against purple ball on wall for following: Cervical retraction x 10 reps with 5 sec holds reviewing proper technique again as pt was performing thoracic ext with cervical retraction, then cervical retraction with holding 1# dumb bell: alternating flexion x 10, bilateral scaption x 10  Manual Therapy Gentle cervical traction with suboccipital release STM in supine to L SCM, upper traps, levator, paraspinals and edge of lat where there is a larger trigger point   07/20/24: Therapeutic Exercises Pulleys into flex and then abd x 2 min each, pt reports  Roll yellow ball up wall into flex x 10, then Lt UE abd x 6 returning therapist demo Therapeutic Activities  Supine over half foam roll with 1 pillow as pt wanted to try again but did decrease reps: Bil horz abd x 5, bil UE scaption into a V x 5, then bil UE abd in a snow angel x 5; pt reports no pain today, just discomfort from new position of stretches on foam Supine on large mat table: Meeks Decompression x 5 reps, 5 sec holds. Pt able to lay with 1 pillow but did have tingling in the arm start with these, but probable due to  new exs on foam roll.  Seated in chair with head against purple ball on wall for following: Cervical retraction x 10 reps with 5 sec holds reviewing proper technique as pt was performing thoracic ext with cervical retraction, then cervical retraction with holding 1# dumb bell: alternating flexion x 10, bilateral scaption x 10  Manual Therapy Gentle cervical traction with suboccipital release STM in supine to L SCM, upper traps,  levator, paraspinals with cocoa butter P/ROM into cervical bil side bending and rotation with and without overpressure to clavicle for increased stretch  07/14/24: Therapeutic Exercises Pulleys into flex and then abd x 2 min each returning therapist demo and VC's to relax shoulder during  Roll yellow ball up wall into flex x 5 with feeling of stretching Therapeutic Activities  Supine on large mat table:  Meeks decompression exercises with 5 sec holds x 5 each - able to do without increased tingling in arm with only 1 pillow under head, started having tingling while showing pt supine scap so therapist added 2nd pillow which alleviated tingling, overhead narrow and wide grip flexion x 7 reps each, supine shoulder ER with yellow band x 7,  horizontal abduction x 7, diagonals x 7 -with pt returning therapist demo, issued as part of HEP Seated in chair with head against purple ball on wall for following: Cervical retraction x 10 reps with 5 sec holds returning therapist demo, then cervical retraction with: alternating flexion x 10, bilateral scaption x 10  Manual Therapy Gentle cervical traction with suboccipital release STM in supine to L SCM, upper traps, levator, paraspinals   07/12/24: Therapeutic Exercises Pulleys into flex and then abd x 2 min each returning therapist demo and VC's to relax shoulder during  Roll yellow ball up wall into flex x 5 with feeling of stretching Therapeutic Activities  Supine on large mat table:  Meeks decompression exercises with 5 sec holds x 5 each - had to add pillow under head due to increased arm tingling and pain and this helped some - therapist also performed gentle STM to L shoulder/neck to help decrease discomfort, supine shoulder ER with yellow band x 7, supine alternating flexion x 7, supine scaption x 10  Seated in chair with head against purple ball on wall for following: Cervical retraction x 10 reps with 5 sec holds returning therapist demo, then  cervical retraction with: alternating flexion x 10, bilateral scaption x 10  Manual Therapy Gentle cervical traction with suboccipital release STM in supine to L SCM, upper traps, levator, paraspinals and then to R side lying to the same and also rhomboids using cocoa butter and wave tool- pt reported relief after this      PATIENT EDUCATION:  Access Code: [email or web address] URL: [email or web address] Date: 08/16/2024 Prepared by: Grayce Sheldon  Exercises - Standing 'L' Stretch at Counter  - 1-2 x daily - 7 x weekly - 1 sets - 3 reps - 10-15 hold - Supine Lower Trunk Rotation  - 1 x daily - 7 x weekly - 1 sets - 3-5 reps - 10 hold - TL Sidebending Stretch - Single Arm Overhead  - 1-2 x daily - 7 x weekly - 1 sets - 3 reps - 10 hold Education details: Continued discussion of posture and added Meeks Decompression Exs  Person educated: Patient Education method: Explanation, demonstration, VC's and handout issued Education comprehension: verbalized understanding, returned demo and will benefit from further review  HOME EXERCISE PROGRAM: Access Code: [email or web address] URL: [email or web address] Date: 07/05/2024 Prepared by:  [facility]  Exercises - Seated Cervical Retraction  - 1 x daily - 7 x weekly - 1 sets - 10 reps - 3-5 sec hold - Seated Scapular Retraction  - 1 x daily - 7 x weekly - 1 sets - 10 reps - 3-5 sec hold  07/06/24 - Meeks Decompression Exs  07/14/24- supine scapular series  ASSESSMENT:  CLINICAL IMPRESSION: Pt continues to demonstrate improvement with soft tissue mobilization. Continued today with focus on serratus and edge of lats to decrease tightness. Numerous areas of tightness noted in these areas that did improve with STM. Pt has been compliant with her HEP.    OBJECTIVE IMPAIRMENTS: decreased knowledge of condition, decreased strength, increased fascial restrictions, increased muscle spasms, impaired UE functional use, postural  dysfunction, and pain.   ACTIVITY LIMITATIONS: carrying, lifting, and reach over head  PARTICIPATION LIMITATIONS: cleaning and laundry  PERSONAL FACTORS: Past/current experiences hx of radiation are also affecting patient's functional outcome.   REHAB POTENTIAL: Good  CLINICAL DECISION MAKING: Stable/uncomplicated  EVALUATION COMPLEXITY: Low  GOALS: Goals reviewed with patient? Yes   LONG TERM GOALS=SHORT TERM GOALS Target date: 08/02/24  Pt will report a 75% improvement in pain in LUE to allow pt improved comfort and function. Baseline:  Goal status: 60% improvement  2.  Pt will demonstrate 150 degrees of L shoulder flexion to allow her to reach up overhead. Baseline:  Goal status: MET 08/02/24- 159 degrees  3.  Pt will demonstrate 160 degrees of L shoulder abduction to allow her to reach to the side. Baseline:  Goal status: MET 08/02/24 - 170 degrees  4.  Pt will be independent in a home exercise program to continue to improve posture and postural awareness. Baseline:  Goal status: ONGOING  PLAN:  PT FREQUENCY: 2x/week  PT DURATION: 4 weeks  PLANNED INTERVENTIONS: 97164- PT Re-evaluation, 97110-Therapeutic exercises, 97530- Therapeutic activity, 97112- Neuromuscular re-education, 97535- Self Care, 02859- Manual therapy, [phone]- Orthotic Initial, [phone]- Orthotic/Prosthetic subsequent, Patient/Family education, Balance training, Joint mobilization, Therapeutic exercises, Therapeutic activity, Neuromuscular re-education, Gait training, and Self Care  PLAN FOR NEXT SESSION: add postural band exs to HEP,MFR/STM to lateral edge of lat, Cont postural stability finding exercises that decrease Lt UE symptoms; STM to L neck and shoulders especially upper traps, levator, posture exercises, L shoulder ROM  [employer], PT 08/23/2024, 3:59 PM   1. Decompression Exercise     Cancer Rehab [phone]    Lie on back on firm surface, knees bent, feet flat, arms turned up, out to  sides, backs of hands down. Time _5-15__ minutes. Surface: floor   2. Shoulder Press    Start in Decompression Exercise position. Press shoulders downward towards supporting surface. Hold __2-3__ seconds while counting out loud. Repeat _3-5___ times. Do _1-2___ times per day.   3. Head Press    Bring cervical spine (neck) into neutral position (by either tucking the chin towards the chest or tilting the chin upward). Feel weight on back of head. Press head downward into supporting surface.    Hold _2-3__ seconds. Repeat _3-5__ times. Do _1-2__ times per day.   4. Leg Lengthener    Straighten one leg. Pull toes AND forefoot toward knee, extend heel. Lengthen leg by pulling pelvis away from ribs. Hold _2-3__ seconds. Relax. Repeat __4-6__ times. Do other leg.  Surface: floor   5. Leg Press    Straighten one leg down to floor keeping leg aligned with hip. Pull toes AND forefoot toward knee; extend heel.  Press entire leg downward (as if pressing leg into sandy beach). DO NOT BEND KNEE. Hold _2-3__ seconds. Do __4-6__ times. Repeat with other leg.  "

## 2024-08-25 ENCOUNTER — Encounter: Payer: Self-pay | Admitting: Physical Therapy

## 2024-08-25 ENCOUNTER — Ambulatory Visit: Payer: Medicare Other

## 2024-08-25 ENCOUNTER — Ambulatory Visit (INDEPENDENT_AMBULATORY_CARE_PROVIDER_SITE_OTHER)

## 2024-08-25 ENCOUNTER — Ambulatory Visit: Admitting: Physical Therapy

## 2024-08-25 VITALS — Ht 62.0 in | Wt 136.0 lb

## 2024-08-25 DIAGNOSIS — R293 Abnormal posture: Secondary | ICD-10-CM

## 2024-08-25 DIAGNOSIS — Z0000 Encounter for general adult medical examination without abnormal findings: Secondary | ICD-10-CM | POA: Diagnosis not present

## 2024-08-25 DIAGNOSIS — M79602 Pain in left arm: Secondary | ICD-10-CM | POA: Diagnosis not present

## 2024-08-25 DIAGNOSIS — Z1211 Encounter for screening for malignant neoplasm of colon: Secondary | ICD-10-CM

## 2024-08-25 DIAGNOSIS — C50412 Malignant neoplasm of upper-outer quadrant of left female breast: Secondary | ICD-10-CM

## 2024-08-25 DIAGNOSIS — M25612 Stiffness of left shoulder, not elsewhere classified: Secondary | ICD-10-CM

## 2024-08-25 NOTE — Patient Instructions (Addendum)
 Veronica Robles,  Thank you for taking the time for your Medicare Wellness Visit. I appreciate your continued commitment to your health goals. Please review the care plan we discussed, and feel free to reach out if I can assist you further.  Please note that Annual Wellness Visits do not include a physical exam. Some assessments may be limited, especially if the visit was conducted virtually. If needed, we may recommend an in-person follow-up with your provider.  Ongoing Care Seeing your primary care provider every 3 to 6 months helps us  monitor your health and provide consistent, personalized care.   Referrals If a referral was made during today's visit and you haven't received any updates within two weeks, please contact the referred provider directly to check on the status.  Recommended Screenings:  Health Maintenance  Topic Date Due   Colon Cancer Screening  Never done   Pneumococcal Vaccine for age over 27 (4 of 4 - PCV20 or PCV21) 05/11/2023   Osteoporosis screening with Bone Density Scan  11/24/2024   COVID-19 Vaccine (9 - Pfizer risk 2025-26 season) 11/24/2024   Breast Cancer Screening  04/14/2025   Medicare Annual Wellness Visit  08/25/2025   DTaP/Tdap/Td vaccine (3 - Td or Tdap) 12/03/2026   Flu Shot  Completed   Hepatitis C Screening  Completed   Meningitis B Vaccine  Aged Out   Hepatitis B Vaccine  Discontinued   Zoster (Shingles) Vaccine  Discontinued       08/25/2024   10:45 AM  Advanced Directives  Does Patient Have a Medical Advance Directive? No  Would patient like information on creating a medical advance directive? No - Patient declined    Vision: Annual vision screenings are recommended for early detection of glaucoma, cataracts, and diabetic retinopathy. These exams can also reveal signs of chronic conditions such as diabetes and high blood pressure.  Dental: Annual dental screenings help detect early signs of oral cancer, gum disease, and other conditions  linked to overall health, including heart disease and diabetes.

## 2024-08-25 NOTE — Therapy (Signed)
 " OUTPATIENT PHYSICAL THERAPY  UPPER EXTREMITY ONCOLOGY TREATMENT  Patient Name: Veronica Robles MRN: [id number] DOB:03-09-1956, 69 y.o., female Today's Date: 08/25/2024  END OF SESSION:  PT End of Session - 08/25/24 1423     Visit Number 13    Number of Visits 16    Date for Recertification  08/30/24    Authorization Type None needed    PT Start Time 1401    PT Stop Time 1453    PT Time Calculation (min) 52 min    Activity Tolerance Patient tolerated treatment well    Behavior During Therapy [facility] 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   Family history of breast cancer 03/20/2022   Family history of pancreatic cancer 03/20/2022   HIV infection (HCC) 1993   Hypertension    Past Surgical History:  Procedure Laterality Date   ABDOMINAL HYSTERECTOMY     APPENDECTOMY     BREAST BIOPSY Right 2015   BREAST BIOPSY Left 08/26/2022   US  LT RADIOACTIVE SEED LOC 08/26/2022 [facility]   BREAST BIOPSY  08/26/2022   MM LT RADIOACTIVE SEED LOC MAMMO GUIDE 08/26/2022 [facility]   BREAST LUMPECTOMY WITH RADIOACTIVE SEED LOCALIZATION Left 08/27/2022   Procedure: LEFT BREAST LUMPECTOMY WITH RADIOACTIVE SEED LOCALIZATION;  Surgeon: Vernetta Berg, MD;  Location: [facility];  Service: General;  Laterality: Left;   CYSTOSCOPY/RETROGRADE/URETEROSCOPY Right 11/14/2016   Procedure: CYSTOSCOPY/RETROGRADE/URETEROSCOPY/ STONE EXTRACTION/HOLMIUM LASER/STENT PLACEMENT;  Surgeon: Mark Ottelin, MD;  Location: [facility];  Service: Urology;  Laterality: Right;   PORT-A-CATH REMOVAL Right 08/27/2022   Procedure: REMOVAL PORT-A-CATH;  Surgeon: Vernetta Berg, MD;  Location: [facility];  Service: General;  Laterality: Right;   PORTACATH PLACEMENT Right 03/26/2022   Procedure: INSERTION PORT-A-CATH;  Surgeon: Vernetta Berg, MD;  Location: [facility];  Service: General;  Laterality: Right;   RADIOACTIVE SEED GUIDED AXILLARY SENTINEL LYMPH NODE Left 08/27/2022   Procedure: RADIOACTIVE SEED GUIDED LEFT AXILLARY SENTINEL LYMPH NODE DISSECTION;  Surgeon: Vernetta Berg, MD;  Location: [facility];  Service: General;  Laterality: Left;   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   HYPERTENSION, BENIGN 01/26/2007   SHINGLES, RECURRENT 12/08/2006   HIV disease (HCC) 10/26/2006   HX, PERSONAL, PENICILLIN ALLERGY 10/26/2006   HX, PERSONAL, DRUG ALLERGY NOS 10/26/2006    PCP: Raguel Blush, MD  REFERRING PROVIDER: Odean Potts, MD  REFERRING DIAG:  C50.412,Z17.0 (ICD-10-CM) - Malignant neoplasm of upper-outer quadrant of left breast in female, estrogen receptor positive (HCC)  THERAPY DIAG:  Pain in left arm  Stiffness of left shoulder, not elsewhere classified  Abnormal posture  Malignant neoplasm of upper-outer quadrant of left breast in female, estrogen receptor positive (HCC)  ONSET DATE: 06/14/24  Rationale for Evaluation and Treatment: Rehabilitation  SUBJECTIVE:  SUBJECTIVE STATEMENT:  I fell on my driveway coming here. I am ok. It was like my leg did a split.   PERTINENT HISTORY: Post left lumpectomy 08/27/22.  3 of 4  lymph nodes positive. Neoadjuvant chemotherapy completed and radiation. It is ER positive, PR negative, and HER2 negative with a Ki67 of 10%. She has a positive axillary node. HIV  PAIN:  Are you having pain?  PAIN:  Are you having pain? None currently NPRS scale:  Pain location: left hand Pain orientation: Left  PAIN TYPE: tingling Pain description: intermittent  Aggravating factors: random times Relieving factors: tylenol   PRECAUTIONS: Other: at risk of lymphedema  RED FLAGS: None   WEIGHT BEARING RESTRICTIONS: No  FALLS:  Has patient fallen in last 6 months? No  LIVING ENVIRONMENT: Lives with: brother Lives in: House/apartment Stairs: No;  Has following equipment at home: None  OCCUPATION: retired  LEISURE: active with her grandkids, does arm exercises, walks, leg exercises  HAND DOMINANCE: right   PRIOR LEVEL OF FUNCTION: Independent  PATIENT GOALS: to decrease the pain   OBJECTIVE: Note: Objective measures were completed at Evaluation unless otherwise noted.  COGNITION: Overall cognitive status: Within functional limits for tasks assessed   PALPATION: Increased muscle tightness noted in L upper traps, scalenes, lateral and posterior cervical muscles  OBSERVATIONS / OTHER ASSESSMENTS: very mild edema present at posterior wrist and 2nd digit on L  POSTURE: forward head, rounded shoulders  UPPER EXTREMITY AROM/PROM:  A/PROM RIGHT   eval   Shoulder extension 69  Shoulder flexion 154  Shoulder abduction 172  Shoulder internal rotation 51  Shoulder external rotation 97    (Blank rows = not tested)  A/PROM LEFT   eval LEFT 08/03/23  Shoulder extension 61   Shoulder flexion 141 159  Shoulder abduction 154 170  Shoulder internal rotation 64   Shoulder external rotation 85     (Blank rows = not tested)  CERVICAL AROM: All within normal limits:    Percent limited  Flexion WFL  Extension WFL  Right lateral flexion WFL  Left lateral flexion WFL   Right rotation WFL  Left rotation WFL    UPPER EXTREMITY STRENGTH: Shoulders 4/5  LYMPHEDEMA ASSESSMENTS:   SURGERY TYPE/DATE: L lumpectomy 08/27/22  NUMBER OF LYMPH NODES REMOVED: 3 of 4 nodes positive  CHEMOTHERAPY: completed  RADIATION:completed  HORMONE TREATMENT: yes anastrozole   INFECTIONS: none   LYMPHEDEMA ASSESSMENTS:   LANDMARK RIGHT  eval  At axilla  31  15 cm proximal to the proximal aspect of the olecranon process 27.5  10 cm proximal to the proximal aspect of the olecranon process 25  Olecranon process 23  15 cm proximal to the proximal aspect of the ulnar styloid process 22  10 cm proximal to the proximal aspect of the ulnar styloid process 19.5  Just distal to the ulnar styloid process 15  Across hand at thumb web space 18  At base of 2nd digit 6.3  (Blank rows = not tested)  LANDMARK LEFT  eval  At axilla  30.5  15 cm proximal to the proximal aspect of the olecranon process 26.9  10 cm proximal to the proximal aspect of the olecranon process 25.5  Olecranon process 22.5  15 cm proximal to the proximal aspect of the ulnar styloid process 21.2  10 cm proximal to  the proximal aspect of the ulnar styloid process 18.5  Just distal to the ulnar styloid process 15.6  Across hand at thumb web space 18  At base of 2nd digit 6.3  (Blank rows = not tested)  Chest circumference just inferior to the axillae:  Chest circumference at the largest point:         L-DEX LYMPHEDEMA SCREENING: The patient was assessed using the L-Dex machine today to produce a lymphedema index baseline score. The patient will be reassessed on a regular basis (typically every 3 months) to obtain new L-Dex scores. If the score is > 6.5 points away from his/her baseline score indicating onset of subclinical lymphedema, it will be recommended to wear a compression garment for 4 weeks, 12 hours per day and then be reassessed. If the score continues to be > 6.5 points from baseline  at reassessment, we will initiate lymphedema treatment. Assessing in this manner has a 95% rate of preventing clinically significant lymphedema.  QUICK DASH SURVEY:                                                                                                                                TREATMENT DATE:  08/25/2024 Pulleys x 2 min flexion and abduction Ball rolls on wall x 10 flexion and left abd. X 10 Dual cable machine with 3 lbs x 10 reps each with pt returning therapist demo: extension bilaterally, ER on L only, retraction with v/c for upright posture and chin tuck throughout MANUAL In R side lying: STM using cocoa butter and WAVE tool to L serratus and edge of lats where there were trigger points as well as rhomboids, UT and levator but overall trigger points improved from last session  08/23/2024 Pulleys x 2 min flexion and abduction Ball rolls on wall x 10 flexion and left abd. X 10 MANUAL In R side lying: STM using cocoa butter and WAVE tool to L serratus and edge of lats where there were numerous trigger points as well as rhomboids, UT and levator  08/18/2024 Discussed progress Pulleys x 2 min flexion and abduction Ball rolls on wall x 10 flexion and left abd. X 10 Standing postural exercises with red band; scapular retraction, shoulder extension, L ER x 10 ea Wall walk to the right to stretch left lateral trunk x 5 with stretch felt today Lower trunk rotation x 7 reps bilaterally MANUAL STM with cocoa butter using wave tool to left UT/levator and lateral trunk/scapular areas of tenderness in R sidelying and then in supine to L pec   08/16/2024 Discussed progress Pulleys x 2 min flexion and abduction Ball rolls on wall x 10 flexion and left abd. X 8 Standing postural exercises with red band; scapular retraction, shoulder extension, Bilateral ER x 10 ea Wall walk to the right to stretch left lateral trunk; no real stretch felt so tried standing SB stretch to the right x 3  with arm overhead and good stretch felt. Standing lat stretch at laundry cart x 4, 10 sec holds Supine snow angels x 7 Lower trunk rotation x 4-5 reps bilaterally  Updated HEP MANUAL STM with cocoa butter to left UT/levator and lateral trunk/scapular areas of tenderness in supine PROM left shoulder flex, scaption, abd, ER  08/09/2024 Discussed progress  Therapeutic Exercises Pulleys into flex x 3 min and then abd x 2 min  Roll yellow ball up wall into flex x 10, then Lt UE abd x 10 returning therapist demo Therapeutic Activities  Supine on large mat table on 1 pillow: supine scapular series with red band x 10 reps as follows with pt returning therapist demo: narrow and wide grip flexion, horizontal abduction, diagonals, and ER Supine on large mat table: Meeks Decompression x 5 reps, 5 sec holds.  MANUAL STM with cocoa butter to left UT/levator and lateral trunk/scapular areas of tenderness in supine   08/02/24: Assessed progress towards goals in therapy Therapeutic Exercises Pulleys into flex and then abd x 2 min each, pt reports  Roll yellow ball up wall into flex x 10, then Lt UE abd x 10 returning therapist demo Therapeutic Activities  Supine on large mat table on 1 pillow: supine scapular series with red band x 10 reps as follows with pt returning therapist demo: narrow and wide grip flexion, horizontal abduction, diagonals, and ER Supine on large mat table: Meeks Decompression x 5 reps, 5 sec holds. Pt able to lay with 1 pillow but did have some tingling in the arm   07/29/24: Therapeutic Exercises Pulleys into flex and then abd x 2 min each, pt reports  Roll yellow ball up wall into flex x 10, then Lt UE abd x 6 returning therapist demo Therapeutic Activities  Supine over half foam roll with 1 pillow but removed 1/2 way throughout due to discomfort in back: supine scapular series with red band x 10 reps as follows with pt returning therapist demo: narrow and wide grip flexion,  horizontal abduction, diagonals, and ER Supine on large mat table: Meeks Decompression x 5 reps, 5 sec holds. Pt able to lay with 1 pillow but did have some tingling in the arm  Manual Therapy In R sidelying: using cocoa butter STM to L serratus and edge of lats where pt has trigger points with decreased muscle tension noted by end of session  07/26/24: Therapeutic Exercises Pulleys into flex and then abd x 2 min each, pt reports  Roll yellow ball up wall into flex x 10, then Lt UE abd x 6 returning therapist demo Therapeutic Activities  Supine over half foam roll with 1 pillow as pt wanted to try again but did decrease reps: Bil horz abd x 5, bil UE scaption into a V x 5, then bil UE abd in a snow angel x 5; pt reports no pain today Supine on large mat table: Meeks Decompression x 5 reps, 5 sec holds. Pt able to lay with 1 pillow but did have some tingling in the arm  Seated in chair with head against purple ball on wall for following: Cervical retraction x 10 reps with 5 sec holds reviewing proper technique again as pt was performing thoracic ext with cervical retraction, then cervical retraction with holding 1# dumb bell: alternating flexion x 10, bilateral scaption x 10  Manual Therapy Gentle cervical traction with suboccipital release STM in supine to L SCM, upper traps, levator, paraspinals and edge of lat where there is a larger trigger point   07/20/24: Therapeutic Exercises Pulleys into flex and then abd x 2 min each, pt reports  Roll yellow ball up wall into flex x 10,  then Lt UE abd x 6 returning therapist demo Therapeutic Activities  Supine over half foam roll with 1 pillow as pt wanted to try again but did decrease reps: Bil horz abd x 5, bil UE scaption into a V x 5, then bil UE abd in a snow angel x 5; pt reports no pain today, just discomfort from new position of stretches on foam Supine on large mat table: Meeks Decompression x 5 reps, 5 sec holds. Pt able to lay with  1 pillow but did have tingling in the arm start with these, but probable due to new exs on foam roll.  Seated in chair with head against purple ball on wall for following: Cervical retraction x 10 reps with 5 sec holds reviewing proper technique as pt was performing thoracic ext with cervical retraction, then cervical retraction with holding 1# dumb bell: alternating flexion x 10, bilateral scaption x 10  Manual Therapy Gentle cervical traction with suboccipital release STM in supine to L SCM, upper traps, levator, paraspinals with cocoa butter P/ROM into cervical bil side bending and rotation with and without overpressure to clavicle for increased stretch  07/14/24: Therapeutic Exercises Pulleys into flex and then abd x 2 min each returning therapist demo and VC's to relax shoulder during  Roll yellow ball up wall into flex x 5 with feeling of stretching Therapeutic Activities  Supine on large mat table:  Meeks decompression exercises with 5 sec holds x 5 each - able to do without increased tingling in arm with only 1 pillow under head, started having tingling while showing pt supine scap so therapist added 2nd pillow which alleviated tingling, overhead narrow and wide grip flexion x 7 reps each, supine shoulder ER with yellow band x 7,  horizontal abduction x 7, diagonals x 7 -with pt returning therapist demo, issued as part of HEP Seated in chair with head against purple ball on wall for following: Cervical retraction x 10 reps with 5 sec holds returning therapist demo, then cervical retraction with: alternating flexion x 10, bilateral scaption x 10  Manual Therapy Gentle cervical traction with suboccipital release STM in supine to L SCM, upper traps, levator, paraspinals   07/12/24: Therapeutic Exercises Pulleys into flex and then abd x 2 min each returning therapist demo and VC's to relax shoulder during  Roll yellow ball up wall into flex x 5 with feeling of stretching Therapeutic  Activities  Supine on large mat table:  Meeks decompression exercises with 5 sec holds x 5 each - had to add pillow under head due to increased arm tingling and pain and this helped some - therapist also performed gentle STM to L shoulder/neck to help decrease discomfort, supine shoulder ER with yellow band x 7, supine alternating flexion x 7, supine scaption x 10  Seated in chair with head against purple ball on wall for following: Cervical retraction x 10 reps with 5 sec holds returning therapist demo, then cervical retraction with: alternating flexion x 10, bilateral scaption x 10  Manual Therapy Gentle cervical traction with suboccipital release STM in supine to L SCM, upper traps, levator, paraspinals and then to R side lying to the same and also rhomboids using cocoa butter and wave tool- pt reported relief after this      PATIENT EDUCATION:  Access Code: [email or web address] URL: [email or web address] Date: 08/16/2024 Prepared by: Grayce Sheldon  Exercises - Standing 'L' Stretch at Counter  - 1-2 x daily - 7 x weekly - 1 sets - 3  reps - 10-15 hold - Supine Lower Trunk Rotation  - 1 x daily - 7 x weekly - 1 sets - 3-5 reps - 10 hold - TL Sidebending Stretch - Single Arm Overhead  - 1-2 x daily - 7 x weekly - 1 sets - 3 reps - 10 hold Education details: Continued discussion of posture and added Meeks Decompression Exs  Person educated: Patient Education method: Explanation, demonstration, VC's and handout issued Education comprehension: verbalized understanding, returned demo and will benefit from further review  HOME EXERCISE PROGRAM: Access Code: [email or web address] URL: [email or web address] Date: 07/05/2024 Prepared by: Florina Lanis Carbon  Exercises - Seated Cervical Retraction  - 1 x daily - 7 x weekly - 1 sets - 10 reps - 3-5 sec hold - Seated Scapular Retraction  - 1 x daily - 7 x weekly - 1 sets - 10 reps - 3-5 sec hold  07/06/24 - Meeks Decompression  Exs  07/14/24- supine scapular series  ASSESSMENT:  CLINICAL IMPRESSION: Pt continuing to get relief with manual therapy. She had less trigger points palpable today during STM. Added new posture exercises on dual cable machine and pt did well with these. She did require cueing for upright posture and chin tuck position throughout. She reports less episodes of tingling since starting therapy.    OBJECTIVE IMPAIRMENTS: decreased knowledge of condition, decreased strength, increased fascial restrictions, increased muscle spasms, impaired UE functional use, postural dysfunction, and pain.   ACTIVITY LIMITATIONS: carrying, lifting, and reach over head  PARTICIPATION LIMITATIONS: cleaning and laundry  PERSONAL FACTORS: Past/current experiences hx of radiation are also affecting patient's functional outcome.   REHAB POTENTIAL: Good  CLINICAL DECISION MAKING: Stable/uncomplicated  EVALUATION COMPLEXITY: Low  GOALS: Goals reviewed with patient? Yes   LONG TERM GOALS=SHORT TERM GOALS Target date: 08/02/24  Pt will report a 75% improvement in pain in LUE to allow pt improved comfort and function. Baseline:  Goal status: 60% improvement  2.  Pt will demonstrate 150 degrees of L shoulder flexion to allow her to reach up overhead. Baseline:  Goal status: MET 08/02/24- 159 degrees  3.  Pt will demonstrate 160 degrees of L shoulder abduction to allow her to reach to the side. Baseline:  Goal status: MET 08/02/24 - 170 degrees  4.  Pt will be independent in a home exercise program to continue to improve posture and postural awareness. Baseline:  Goal status: ONGOING  PLAN:  PT FREQUENCY: 2x/week  PT DURATION: 4 weeks  PLANNED INTERVENTIONS: 97164- PT Re-evaluation, 97110-Therapeutic exercises, 97530- Therapeutic activity, 97112- Neuromuscular re-education, 97535- Self Care, [id number]- Manual therapy, [phone]- Orthotic Initial, [phone]- Orthotic/Prosthetic subsequent, Patient/Family education,  Balance training, Joint mobilization, Therapeutic exercises, Therapeutic activity, Neuromuscular re-education, Gait training, and Self Care  PLAN FOR NEXT SESSION: add postural band exs to HEP,MFR/STM to lateral edge of lat, Cont postural stability finding exercises that decrease Lt UE symptoms; STM to L neck and shoulders especially upper traps, levator, posture exercises, L shoulder ROM  [employer], PT 08/25/2024, 2:56 PM   1. Decompression Exercise     Cancer Rehab [phone]    Lie on back on firm surface, knees bent, feet flat, arms turned up, out to sides, backs of hands down. Time _5-15__ minutes. Surface: floor   2. Shoulder Press    Start in Decompression Exercise position. Press shoulders downward towards supporting surface. Hold __2-3__ seconds while counting out loud. Repeat _3-5___ times. Do _1-2___ times per day.   3. Head Press    Bring cervical  spine (neck) into neutral position (by either tucking the chin towards the chest or tilting the chin upward). Feel weight on back of head. Press head downward into supporting surface.    Hold _2-3__ seconds. Repeat _3-5__ times. Do _1-2__ times per day.   4. Leg Lengthener    Straighten one leg. Pull toes AND forefoot toward knee, extend heel. Lengthen leg by pulling pelvis away from ribs. Hold _2-3__ seconds. Relax. Repeat __4-6__ times. Do other leg.  Surface: floor   5. Leg Press    Straighten one leg down to floor keeping leg aligned with hip. Pull toes AND forefoot toward knee; extend heel.  Press entire leg downward (as if pressing leg into sandy beach). DO NOT BEND KNEE. Hold _2-3__ seconds. Do __4-6__ times. Repeat with other leg.  "

## 2024-08-25 NOTE — Progress Notes (Signed)
 "  Chief Complaint  Patient presents with   Medicare Wellness     Subjective:   Veronica Robles is a 69 y.o. female who presents for a Medicare Annual Wellness Visit.  Visit info / Clinical Intake: Medicare Wellness Visit Type:: Subsequent Annual Wellness Visit Persons participating in visit and providing information:: patient Medicare Wellness Visit Mode:: Telephone If telephone:: video declined Since this visit was completed virtually, some vitals may be partially provided or unavailable. Missing vitals are due to the limitations of the virtual format.: Documented vitals are patient reported If Telephone or Video please confirm:: I connected with patient using audio/video enable telemedicine. I verified patient identity with two identifiers, discussed telehealth limitations, and patient agreed to proceed. Patient Location:: Home Provider Location:: Office Interpreter Needed?: No Pre-visit prep was completed: yes AWV questionnaire completed by patient prior to visit?: no Living arrangements:: with family/others (brother lives with her) Patient's Overall Health Status Rating: good Typical amount of pain: none Does pain affect daily life?: no Are you currently prescribed opioids?: no  Dietary Habits and Nutritional Risks How many meals a day?: 3 Eats fruit and vegetables daily?: yes Most meals are obtained by: preparing own meals; eating out In the last 2 weeks, have you had any of the following?: none Diabetic:: no  Functional Status Activities of Daily Living (to include ambulation/medication): Independent Ambulation: Independent with device- listed below Home Assistive Devices/Equipment: Eyeglasses Medication Administration: Independent Home Management (perform basic housework or laundry): Independent Manage your own finances?: yes Primary transportation is: driving Concerns about vision?: no *vision screening is required for WTM* Concerns about hearing?: no  Fall  Screening Falls in the past year?: 0 Number of falls in past year: 0 Was there an injury with Fall?: 0 Fall Risk Category Calculator: 0 Patient Fall Risk Level: Low Fall Risk  Fall Risk Patient at Risk for Falls Due to: No Fall Risks Fall risk Follow up: Falls evaluation completed; Falls prevention discussed  Home and Transportation Safety: All rugs have non-skid backing?: N/A, no rugs All stairs or steps have railings?: yes (outside) Grab bars in the bathtub or shower?: (!) no Have non-skid surface in bathtub or shower?: yes Good home lighting?: yes Regular seat belt use?: yes Hospital stays in the last year:: no  Cognitive Assessment Difficulty concentrating, remembering, or making decisions? : no Will 6CIT or Mini Cog be Completed: yes What year is it?: 0 points What month is it?: 0 points Give patient an address phrase to remember (5 components): [address] [place] About what time is it?: 0 points Count backwards from 20 to 1: 0 points Say the months of the year in reverse: 0 points Repeat the address phrase from earlier: 0 points 6 CIT Score: 0 points  Advance Directives (For Healthcare) Does Patient Have a Medical Advance Directive?: No Would patient like information on creating a medical advance directive?: No - Patient declined  Reviewed/Updated  Reviewed/Updated: Reviewed All (Medical, Surgical, Family, Medications, Allergies, Care Teams, Patient Goals)    Allergies (verified) Penicillins   Current Medications (verified) Outpatient Encounter Medications as of 08/25/2024  Medication Sig   anastrozole  (ARIMIDEX ) 1 MG tablet TAKE 1 TABLET(1 MG) BY MOUTH DAILY   Blood Pressure Monitoring (ADULT BLOOD PRESSURE CUFF LG) KIT     emtricitabine -tenofovir  AF (DESCOVY ) 200-25 MG tablet Take 1 tablet by mouth daily.   fostemsavir tromethamine  (RUKOBIA ) 600 MG TB12 ER tablet Take 1 tablet (600 mg total) by mouth 2 (two) times daily.   hydrochlorothiazide   (  HYDRODIURIL ) 25 MG tablet Take 1 tablet (25 mg total) by mouth daily.   polyethylene glycol powder (MIRALAX ) 17 GM/SCOOP powder Take 17 g by mouth daily.   SQ injection lenacapavir  (SUNLENCA ) 463.5 MG/1.5ML SQ injection Inject 3 mLs (927 mg total) into the skin every 6 (six) months. Administer each injection subcutaneously at separate sites in the abdomen (more or equal to 2 inches from the navel).   No facility-administered encounter medications on file as of 08/25/2024.    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   Family history of breast cancer 03/20/2022   Family history of pancreatic cancer 03/20/2022   HIV infection (HCC) 1993   Hypertension    Past Surgical History:  Procedure Laterality Date   ABDOMINAL HYSTERECTOMY     APPENDECTOMY     BREAST BIOPSY Right 2015   BREAST BIOPSY Left 08/26/2022   US  LT RADIOACTIVE SEED LOC 08/26/2022 [facility]   BREAST BIOPSY  08/26/2022   MM LT RADIOACTIVE SEED LOC MAMMO GUIDE 08/26/2022 [facility]   BREAST LUMPECTOMY WITH RADIOACTIVE SEED LOCALIZATION Left 08/27/2022   Procedure: LEFT BREAST LUMPECTOMY WITH RADIOACTIVE SEED LOCALIZATION;  Surgeon: Vernetta Berg, MD;  Location: [facility];  Service: General;  Laterality: Left;   CYSTOSCOPY/RETROGRADE/URETEROSCOPY Right 11/14/2016   Procedure: CYSTOSCOPY/RETROGRADE/URETEROSCOPY/ STONE EXTRACTION/HOLMIUM LASER/STENT PLACEMENT;  Surgeon: Mark Ottelin, MD;  Location: [facility];  Service: Urology;  Laterality: Right;   PORT-A-CATH REMOVAL Right 08/27/2022   Procedure: REMOVAL PORT-A-CATH;  Surgeon: Vernetta Berg, MD;  Location: [facility];  Service: General;  Laterality: Right;   PORTACATH PLACEMENT Right 03/26/2022   Procedure: INSERTION PORT-A-CATH;  Surgeon: Vernetta Berg, MD;  Location: [facility];  Service: General;  Laterality: Right;   RADIOACTIVE SEED GUIDED  AXILLARY SENTINEL LYMPH NODE Left 08/27/2022   Procedure: RADIOACTIVE SEED GUIDED LEFT AXILLARY SENTINEL LYMPH NODE DISSECTION;  Surgeon: Vernetta Berg, MD;  Location:  SUR[facility]ERY [facility];  Service: General;  Laterality: Left;   Family History  Problem Relation Age of Onset   Hypertension Mother    Pancreatic cancer Father 50   Breast cancer Sister        dx 56s   Lung cancer Sister        d. 38   Social History   Occupational History   Not on file  Tobacco Use   Smoking status: Former    Current packs/day: 0.00    Types: Cigarettes    Quit date: 07/28/1974    Years since quitting: 50.1   Smokeless tobacco: Never  Vaping Use   Vaping status: Never Used  Substance and Sexual Activity   Alcohol use: No    Alcohol/week: 0.0 standard drinks of alcohol   Drug use: No   Sexual activity: Not Currently    Partners: Male    Birth control/protection: Surgical    Comment: declined condoms   Tobacco Counseling Counseling given: Yes  SDOH Screenings   Food Insecurity: No Food Insecurity (08/25/2024)  Housing: Unknown (08/25/2024)  Transportation Needs: No Transportation Needs (08/25/2024)  Utilities: Not At Risk (08/25/2024)  Alcohol Screen: Low Risk (08/20/2023)  Depression (PHQ2-9): Low Risk (08/25/2024)  Financial Resource Strain: Low Risk (08/20/2023)  Physical Activity: Inactive (08/25/2024)  Social Connections: Moderately Isolated (08/25/2024)  Stress: No Stress Concern Present (08/25/2024)  Tobacco Use: Medium Risk (08/25/2024)  Health Literacy: Adequate Health Literacy (08/25/2024)   See flowsheets for full screening details  Depression Screen PHQ 2 & 9 Depression Scale- Over the past 2 weeks, how often have you been bothered by any of the following problems? Little interest or pleasure in doing things: 0 Feeling down, depressed, or hopeless (PHQ Adolescent also includes...irritable): 0 PHQ-2 Total Score: 0 Trouble falling or staying asleep, or sleeping too much:  0 Feeling tired or having little energy: 0 Poor appetite or overeating (PHQ Adolescent also includes...weight loss): 0 Feeling bad about yourself - or that you are a failure or have let yourself or your family down: 0 Trouble concentrating on things, such as reading the newspaper or watching television (PHQ Adolescent also includes...like school work): 0 Moving or speaking so slowly that other people could have noticed. Or the opposite - being so fidgety or restless that you have been moving around a lot more than usual: 0 Thoughts that you would be better off dead, or of hurting yourself in some way: 0 PHQ-9 Total Score: 0 If you checked off any problems, how difficult have these problems made it for you to do your work, take care of things at home, or get along with other people?: Not difficult at all  Depression Treatment Depression Interventions/Treatment : [id number] Score <4 Follow-up Not Indicated     Goals Addressed               This Visit's Progress     Patient Stated (pt-stated)        Patient stated she plans to continue exercising and monitor her bp readings             Objective:    Today's Vitals   08/25/24 1043  Weight: 136 lb (61.7 kg)  Height: 5' 2 (1.575 m)   Body mass index is 24.87 kg/m.  Hearing/Vision screen Hearing Screening - Comments:: Denies hearing difficulties   Vision Screening - Comments:: Wears rx glasses - plans to schedule an appt w/Optometrist Immunizations and Health Maintenance Health Maintenance  Topic Date Due   Colonoscopy  Never done   Pneumococcal Vaccine: 50+ Years (4 of 4 - PCV20 or PCV21) 05/11/2023   Bone Density Scan  11/24/2024   COVID-19 Vaccine (9 - Pfizer risk 2025-26 season) 11/24/2024   Mammogram  04/14/2025   Medicare Annual Wellness (AWV)  08/25/2025   DTaP/Tdap/Td (3 - Td or Tdap) 12/03/2026   Influenza Vaccine  Completed   Hepatitis C Screening  Completed   Meningococcal B Vaccine  Aged Out   Hepatitis B  Vaccines 19-59 Average Risk  Discontinued   Zoster Vaccines- Shingrix  Discontinued        Assessment/Plan:  This is a routine wellness examination for [place].  I have recommended that this patient have a immunization for Pneumonia but she declines at this time. I have discussed the risks and benefits of this procedure with her. The patient verbalizes understanding.   Colonoscopy status: referral to [facility] (needs Screening Colonoscopy w/h/o and family h/o Breast Cancer)  Patient Care Team: Tanda Bleacher, MD as PCP - General (Family Medicine) Luiz Channel, MD as PCP - Infectious Diseases (Infectious Diseases) Odean Potts, MD as Consulting Physician (Hematology and Oncology) Dewey Rush, MD as Consulting Physician (Radiation Oncology) Vernetta Berg, MD as Consulting Physician (General Surgery)  I have personally reviewed and noted the following in the patients chart:   Medical and social history Use of alcohol, tobacco or illicit drugs  Current medications and supplements including opioid prescriptions. Functional ability and status Nutritional status Physical activity Advanced directives List  of other physicians Hospitalizations, surgeries, and ER visits in previous 12 months Vitals Screenings to include cognitive, depression, and falls Referrals and appointments  Orders Placed This Encounter  Procedures   Ambulatory referral to Gastroenterology    Referral Priority:   Routine    Referral Type:   Consultation    Referral Reason:   Specialty Services Required    Referred to Provider:   Legrand Victory LITTIE DOUGLAS, MD    Number of Visits Requested:   1   In addition, I have reviewed and discussed with patient certain preventive protocols, quality metrics, and best practice recommendations. A written personalized care plan for preventive services as well as general preventive health recommendations were provided to patient.   Verdie CHRISTELLA Saba, CMA   08/25/2024    Return in 1 year (on 08/25/2025).  After Visit Summary: (Declined) Due to this being a telephonic visit, with patients personalized plan was offered to patient but patient Declined AVS at this time   Nurse Notes: scheduled 2027 AWV appt "

## 2024-08-26 ENCOUNTER — Other Ambulatory Visit: Payer: Self-pay

## 2024-08-29 ENCOUNTER — Inpatient Hospital Stay: Payer: Medicare Other | Admitting: Hematology and Oncology

## 2024-08-30 ENCOUNTER — Encounter: Payer: Self-pay | Admitting: Physical Therapy

## 2024-08-30 ENCOUNTER — Ambulatory Visit: Attending: Surgery | Admitting: Physical Therapy

## 2024-08-30 DIAGNOSIS — M79602 Pain in left arm: Secondary | ICD-10-CM

## 2024-08-30 DIAGNOSIS — M25612 Stiffness of left shoulder, not elsewhere classified: Secondary | ICD-10-CM

## 2024-08-30 DIAGNOSIS — C50412 Malignant neoplasm of upper-outer quadrant of left female breast: Secondary | ICD-10-CM

## 2024-08-30 DIAGNOSIS — R293 Abnormal posture: Secondary | ICD-10-CM

## 2024-08-30 NOTE — Therapy (Signed)
 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   Family history of breast cancer 03/20/2022   Family history of pancreatic cancer 03/20/2022   HIV infection (HCC) 1993   Hypertension    Past Surgical History:  Procedure Laterality Date   ABDOMINAL HYSTERECTOMY     APPENDECTOMY     BREAST BIOPSY Right 2015   BREAST BIOPSY Left 08/26/2022   US  LT RADIOACTIVE SEED LOC 08/26/2022 [facility]   BREAST BIOPSY  08/26/2022   MM LT RADIOACTIVE SEED LOC MAMMO GUIDE 08/26/2022 [facility]   BREAST LUMPECTOMY WITH RADIOACTIVE SEED LOCALIZATION Left 08/27/2022   Procedure: LEFT BREAST LUMPECTOMY WITH RADIOACTIVE SEED LOCALIZATION;  Surgeon: Vernetta Berg, MD;  Location: [facility];  Service: General;  Laterality: Left;   CYSTOSCOPY/RETROGRADE/URETEROSCOPY Right 11/14/2016   Procedure: CYSTOSCOPY/RETROGRADE/URETEROSCOPY/ STONE EXTRACTION/HOLMIUM LASER/STENT PLACEMENT;  Surgeon: Mark Ottelin, MD;  Location: [facility];  Service: Urology;  Laterality: Right;   PORT-A-CATH REMOVAL Right 08/27/2022   Procedure: REMOVAL PORT-A-CATH;  Surgeon: Vernetta Berg, MD;  Location: [facility];  Service: General;  Laterality: Right;   PORTACATH PLACEMENT Right 03/26/2022   Procedure: INSERTION PORT-A-CATH;  Surgeon: Vernetta Berg, MD;  Location: [facility];  Service: General;  Laterality:  Right;   RADIOACTIVE SEED GUIDED AXILLARY SENTINEL LYMPH NODE Left 08/27/2022   Procedure: RADIOACTIVE SEED GUIDED LEFT AXILLARY SENTINEL LYMPH NODE DISSECTION;  Surgeon: Vernetta Berg, MD;  Location: [facility];  Service: General;  Laterality: Left;   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   HYPERTENSION, BENIGN 01/26/2007   SHINGLES, RECURRENT 12/08/2006   HIV disease (HCC) 10/26/2006   HX, PERSONAL, PENICILLIN ALLERGY 10/26/2006   HX, PERSONAL, DRUG ALLERGY NOS 10/26/2006    PCP: Raguel Blush, MD  REFERRING PROVIDER: Odean Potts, MD  REFERRING DIAG:  C50.412,Z17.0 (ICD-10-CM) - Malignant neoplasm of upper-outer quadrant of left breast in female, estrogen receptor positive (HCC)  THERAPY DIAG:  Pain in left arm  Stiffness of left shoulder, not elsewhere classified  Abnormal posture  Malignant neoplasm of upper-outer quadrant of left breast in female, estrogen receptor positive (HCC)  ONSET DATE: 06/14/24  Rationale for Evaluation and Treatment: Rehabilitation  SUBJECTIVE:  SUBJECTIVE STATEMENT:  The numbness and tingling is still there but it is feeling better.   PERTINENT HISTORY: Post left lumpectomy 08/27/22.  3 of 4 lymph nodes positive. Neoadjuvant chemotherapy  completed and radiation. It is ER positive, PR negative, and HER2 negative with a Ki67 of 10%. She has a positive axillary node. HIV  PAIN:  Are you having pain?  PAIN:  Are you having pain? None currently NPRS scale:  Pain location: left hand Pain orientation: Left  PAIN TYPE: tingling Pain description: intermittent  Aggravating factors: random times Relieving factors: tylenol   PRECAUTIONS: Other: at risk of lymphedema  RED FLAGS: None   WEIGHT BEARING RESTRICTIONS: No  FALLS:  Has patient fallen in last 6 months? No  LIVING ENVIRONMENT: Lives with: brother Lives in: House/apartment Stairs: No;  Has following equipment at home: None  OCCUPATION: retired  LEISURE: active with her grandkids, does arm exercises, walks, leg exercises  HAND DOMINANCE: right   PRIOR LEVEL OF FUNCTION: Independent  PATIENT GOALS: to decrease the pain   OBJECTIVE: Note: Objective measures were completed at Evaluation unless otherwise noted.  COGNITION: Overall cognitive status: Within functional limits for tasks assessed   PALPATION: Increased muscle tightness noted in L upper traps, scalenes, lateral and posterior cervical muscles  OBSERVATIONS / OTHER ASSESSMENTS: very mild edema present at posterior wrist and 2nd digit on L  POSTURE: forward head, rounded shoulders  UPPER EXTREMITY AROM/PROM:  A/PROM RIGHT   eval   Shoulder extension 69  Shoulder flexion 154  Shoulder abduction 172  Shoulder internal rotation 51  Shoulder external rotation 97    (Blank rows = not tested)  A/PROM LEFT   eval LEFT 08/03/23  Shoulder extension 61   Shoulder flexion 141 159  Shoulder abduction 154 170  Shoulder internal rotation 64   Shoulder external rotation 85     (Blank rows = not tested)  CERVICAL AROM: All within normal limits:    Percent limited  Flexion WFL  Extension WFL  Right lateral flexion WFL  Left lateral flexion WFL  Right rotation WFL  Left rotation WFL     UPPER EXTREMITY STRENGTH: Shoulders 4/5  LYMPHEDEMA ASSESSMENTS:   SURGERY TYPE/DATE: L lumpectomy 08/27/22  NUMBER OF LYMPH NODES REMOVED: 3 of 4 nodes positive  CHEMOTHERAPY: completed  RADIATION:completed  HORMONE TREATMENT: yes anastrozole   INFECTIONS: none   LYMPHEDEMA ASSESSMENTS:   LANDMARK RIGHT  eval  At axilla  31  15 cm proximal to the proximal aspect of the olecranon process 27.5  10 cm proximal to the proximal aspect of the olecranon process 25  Olecranon process 23  15 cm proximal to the proximal aspect of the ulnar styloid process 22  10 cm proximal to the proximal aspect of the ulnar styloid process 19.5  Just distal to the ulnar styloid process 15  Across hand at thumb web space 18  At base of 2nd digit 6.3  (Blank rows = not tested)  LANDMARK LEFT  eval  At axilla  30.5  15 cm proximal to the proximal aspect of the olecranon process 26.9  10 cm proximal to the proximal aspect of the olecranon process 25.5  Olecranon process 22.5  15 cm proximal to the proximal aspect of the ulnar styloid process 21.2  10 cm proximal to  the proximal aspect of the ulnar styloid process 18.5  Just distal to the ulnar styloid process 15.6  Across hand at thumb web space 18  At base of 2nd digit 6.3  (  Blank rows = not tested)  Chest circumference just inferior to the axillae:  Chest circumference at the largest point:         L-DEX LYMPHEDEMA SCREENING: The patient was assessed using the L-Dex machine today to produce a lymphedema index baseline score. The patient will be reassessed on a regular basis (typically every 3 months) to obtain new L-Dex scores. If the score is > 6.5 points away from his/her baseline score indicating onset of subclinical lymphedema, it will be recommended to wear a compression garment for 4 weeks, 12 hours per day and then be reassessed. If the score continues to be > 6.5 points from baseline at reassessment, we will initiate  lymphedema treatment. Assessing in this manner has a 95% rate of preventing clinically significant lymphedema.  QUICK DASH SURVEY:                                                                                                                                TREATMENT DATE:  08/30/2024 Pulleys x 2 min flexion and abduction Ball rolls on wall x 10 flexion and left abd. X 10 Dual cable machine with 3 lbs x 10 reps each with pt returning therapist demo: extension bilaterally, ER on L only, retraction with v/c for upright posture and chin tuck throughout MANUAL In R side lying: STM using cocoa butter and WAVE tool to L serratus and edge of lats where there were trigger points as well as rhomboids, UT and levator but overall trigger points improved from last session  08/25/2024 Pulleys x 2 min flexion and abduction Ball rolls on wall x 10 flexion and left abd. X 10 Dual cable machine with 3 lbs x 10 reps each with pt returning therapist demo: extension bilaterally, ER on L only, retraction with v/c for upright posture and chin tuck throughout MANUAL In R side lying: STM using cocoa butter and WAVE tool to L serratus and edge of lats where there were trigger points as well as rhomboids, UT and levator but overall trigger points improved from last session  08/23/2024 Pulleys x 2 min flexion and abduction Ball rolls on wall x 10 flexion and left abd. X 10 MANUAL In R side lying: STM using cocoa butter and WAVE tool to L serratus and edge of lats where there were numerous trigger points as well as rhomboids, UT and levator  08/18/2024 Discussed progress Pulleys x 2 min flexion and abduction Ball rolls on wall x 10 flexion and left abd. X 10 Standing postural exercises with red band; scapular retraction, shoulder extension, L ER x 10 ea Wall walk to the right to stretch left lateral trunk x 5 with stretch felt today Lower trunk rotation x 7 reps bilaterally MANUAL STM with cocoa butter using  wave tool to left UT/levator and lateral trunk/scapular areas of tenderness in R sidelying and then in supine to L pec   08/16/2024 Discussed progress Pulleys x 2 min  flexion and abduction Ball rolls on wall x 10 flexion and left abd. X 8 Standing postural exercises with red band; scapular retraction, shoulder extension, Bilateral ER x 10 ea Wall walk to the right to stretch left lateral trunk; no real stretch felt so tried standing SB stretch to the right x 3 with arm overhead and good stretch felt. Standing lat stretch at laundry cart x 4, 10 sec holds Supine snow angels x 7 Lower trunk rotation x 4-5 reps bilaterally Updated HEP MANUAL STM with cocoa butter to left UT/levator and lateral trunk/scapular areas of tenderness in supine PROM left shoulder flex, scaption, abd, ER  08/09/2024 Discussed progress  Therapeutic Exercises Pulleys into flex x 3 min and then abd x 2 min  Roll yellow ball up wall into flex x 10, then Lt UE abd x 10 returning therapist demo Therapeutic Activities  Supine on large mat table on 1 pillow: supine scapular series with red band x 10 reps as follows with pt returning therapist demo: narrow and wide grip flexion, horizontal abduction, diagonals, and ER Supine on large mat table: Meeks Decompression x 5 reps, 5 sec holds.  MANUAL STM with cocoa butter to left UT/levator and lateral trunk/scapular areas of tenderness in supine   08/02/24: Assessed progress towards goals in therapy Therapeutic Exercises Pulleys into flex and then abd x 2 min each, pt reports  Roll yellow ball up wall into flex x 10, then Lt UE abd x 10 returning therapist demo Therapeutic Activities  Supine on large mat table on 1 pillow: supine scapular series with red band x 10 reps as follows with pt returning therapist demo: narrow and wide grip flexion, horizontal abduction, diagonals, and ER Supine on large mat table: Meeks Decompression x 5 reps, 5 sec holds. Pt able to lay with 1  pillow but did have some tingling in the arm   07/29/24: Therapeutic Exercises Pulleys into flex and then abd x 2 min each, pt reports  Roll yellow ball up wall into flex x 10, then Lt UE abd x 6 returning therapist demo Therapeutic Activities  Supine over half foam roll with 1 pillow but removed 1/2 way throughout due to discomfort in back: supine scapular series with red band x 10 reps as follows with pt returning therapist demo: narrow and wide grip flexion, horizontal abduction, diagonals, and ER Supine on large mat table: Meeks Decompression x 5 reps, 5 sec holds. Pt able to lay with 1 pillow but did have some tingling in the arm  Manual Therapy In R sidelying: using cocoa butter STM to L serratus and edge of lats where pt has trigger points with decreased muscle tension noted by end of session  07/26/24: Therapeutic Exercises Pulleys into flex and then abd x 2 min each, pt reports  Roll yellow ball up wall into flex x 10, then Lt UE abd x 6 returning therapist demo Therapeutic Activities  Supine over half foam roll with 1 pillow as pt wanted to try again but did decrease reps: Bil horz abd x 5, bil UE scaption into a V x 5, then bil UE abd in a snow angel x 5; pt reports no pain today Supine on large mat table: Meeks Decompression x 5 reps, 5 sec holds. Pt able to lay with 1 pillow but did have some tingling in the arm  Seated in chair with head against purple ball on wall for following: Cervical retraction x 10 reps with 5 sec holds reviewing proper  technique again as pt was performing thoracic ext with cervical retraction, then cervical retraction with holding 1# dumb bell: alternating flexion x 10, bilateral scaption x 10  Manual Therapy Gentle cervical traction with suboccipital release STM in supine to L SCM, upper traps, levator, paraspinals and edge of lat where there is a larger trigger point   07/20/24: Therapeutic Exercises Pulleys into flex and then abd x 2 min each, pt  reports  Roll yellow ball up wall into flex x 10, then Lt UE abd x 6 returning therapist demo Therapeutic Activities  Supine over half foam roll with 1 pillow as pt wanted to try again but did decrease reps: Bil horz abd x 5, bil UE scaption into a V x 5, then bil UE abd in a snow angel x 5; pt reports no pain today, just discomfort from new position of stretches on foam Supine on large mat table: Meeks Decompression x 5 reps, 5 sec holds. Pt able to lay with 1 pillow but did have tingling in the arm start with these, but probable due to new exs on foam roll.  Seated in chair with head against purple ball on wall for following: Cervical retraction x 10 reps with 5 sec holds reviewing proper technique as pt was performing thoracic ext with cervical retraction, then cervical retraction with holding 1# dumb bell: alternating flexion x 10, bilateral scaption x 10  Manual Therapy Gentle cervical traction with suboccipital release STM in supine to L SCM, upper traps, levator, paraspinals with cocoa butter P/ROM into cervical bil side bending and rotation with and without overpressure to clavicle for increased stretch  07/14/24: Therapeutic Exercises Pulleys into flex and then abd x 2 min each returning therapist demo and VC's to relax shoulder during  Roll yellow ball up wall into flex x 5 with feeling of stretching Therapeutic Activities  Supine on large mat table:  Meeks decompression exercises with 5 sec holds x 5 each - able to do without increased tingling in arm with only 1 pillow under head, started having tingling while showing pt supine scap so therapist added 2nd pillow which alleviated tingling, overhead narrow and wide grip flexion x 7 reps each, supine shoulder ER with yellow band x 7,  horizontal abduction x 7, diagonals x 7 -with pt returning therapist demo, issued as part of HEP Seated in chair with head against purple ball on wall for following: Cervical retraction x 10 reps with 5  sec holds returning therapist demo, then cervical retraction with: alternating flexion x 10, bilateral scaption x 10  Manual Therapy Gentle cervical traction with suboccipital release STM in supine to L SCM, upper traps, levator, paraspinals   07/12/24: Therapeutic Exercises Pulleys into flex and then abd x 2 min each returning therapist demo and VC's to relax shoulder during  Roll yellow ball up wall into flex x 5 with feeling of stretching Therapeutic Activities  Supine on large mat table:  Meeks decompression exercises with 5 sec holds x 5 each - had to add pillow under head due to increased arm tingling and pain and this helped some - therapist also performed gentle STM to L shoulder/neck to help decrease discomfort, supine shoulder ER with yellow band x 7, supine alternating flexion x 7, supine scaption x 10  Seated in chair with head against purple ball on wall for following: Cervical retraction x 10 reps with 5 sec holds returning therapist demo, then cervical retraction with: alternating flexion x 10, bilateral scaption x  10  Manual Therapy Gentle cervical traction with suboccipital release STM in supine to L SCM, upper traps, levator, paraspinals and then to R side lying to the same and also rhomboids using cocoa butter and wave tool- pt reported relief after this      PATIENT EDUCATION:  Access Code: [email or web address] URL: [email or web address] Date: 08/16/2024 Prepared by: Grayce Sheldon  Exercises - Standing 'L' Stretch at Counter  - 1-2 x daily - 7 x weekly - 1 sets - 3 reps - 10-15 hold - Supine Lower Trunk Rotation  - 1 x daily - 7 x weekly - 1 sets - 3-5 reps - 10 hold - TL Sidebending Stretch - Single Arm Overhead  - 1-2 x daily - 7 x weekly - 1 sets - 3 reps - 10 hold Education details: Continued discussion of posture and added Meeks Decompression Exs  Person educated: Patient Education method: Explanation, demonstration, VC's and handout issued Education  comprehension: verbalized understanding, returned demo and will benefit from further review  HOME EXERCISE PROGRAM: Access Code: [email or web address] URL: [email or web address] Date: 07/05/2024 Prepared by: Florina Lanis Carbon  Exercises - Seated Cervical Retraction  - 1 x daily - 7 x weekly - 1 sets - 10 reps - 3-5 sec hold - Seated Scapular Retraction  - 1 x daily - 7 x weekly - 1 sets - 10 reps - 3-5 sec hold  07/06/24 - Meeks Decompression Exs  07/14/24- supine scapular series  ASSESSMENT:  CLINICAL IMPRESSION: Assessed pt's progress towards goals in therapy. Pt has met all goals for therapy. She is independent in a home exercise program for posture. The numbness and tingling is 75% improved since evaluation and pt demonstrates improved postural awareness. Pt will be discharged from skilled PT services at this time.    OBJECTIVE IMPAIRMENTS: decreased knowledge of condition, decreased strength, increased fascial restrictions, increased muscle spasms, impaired UE functional use, postural dysfunction, and pain.   ACTIVITY LIMITATIONS: carrying, lifting, and reach over head  PARTICIPATION LIMITATIONS: cleaning and laundry  PERSONAL FACTORS: Past/current experiences hx of radiation are also affecting patient's functional outcome.   REHAB POTENTIAL: Good  CLINICAL DECISION MAKING: Stable/uncomplicated  EVALUATION COMPLEXITY: Low  GOALS: Goals reviewed with patient? Yes   LONG TERM GOALS=SHORT TERM GOALS Target date: 08/02/24  Pt will report a 75% improvement in pain in LUE to allow pt improved comfort and function. Baseline:  Goal status: MET 75%, 08/30/24- 60% improvement 08/02/24  2.  Pt will demonstrate 150 degrees of L shoulder flexion to allow her to reach up overhead. Baseline:  Goal status: MET 08/02/24- 159 degrees  3.  Pt will demonstrate 160 degrees of L shoulder abduction to allow her to reach to the side. Baseline:  Goal status: MET 08/02/24 - 170  degrees  4.  Pt will be independent in a home exercise program to continue to improve posture and postural awareness. Baseline:  Goal status: MET 1/08/30/24  PLAN:  PT FREQUENCY: 2x/week  PT DURATION: 4 weeks  PLANNED INTERVENTIONS: 97164- PT Re-evaluation, 97110-Therapeutic exercises, 97530- Therapeutic activity, 97112- Neuromuscular re-education, 97535- Self Care, [id number]- Manual therapy, [phone]- Orthotic Initial, [phone]- Orthotic/Prosthetic subsequent, Patient/Family education, Balance training, Joint mobilization, Therapeutic exercises, Therapeutic activity, Neuromuscular re-education, Gait training, and Self Care  PLAN FOR NEXT SESSION: d/c this session   Florina Lanis Carbon, PT 08/30/2024, 3:52 PM   1. Decompression Exercise     Cancer Rehab [phone]    Lie on back on firm surface, knees bent, feet flat, arms turned up, out  to sides, backs of hands down. Time _5-15__ minutes. Surface: floor   2. Shoulder Press    Start in Decompression Exercise position. Press shoulders downward towards supporting surface. Hold __2-3__ seconds while counting out loud. Repeat _3-5___ times. Do _1-2___ times per day.   3. Head Press    Bring cervical spine (neck) into neutral position (by either tucking the chin towards the chest or tilting the chin upward). Feel weight on back of head. Press head downward into supporting surface.    Hold _2-3__ seconds. Repeat _3-5__ times. Do _1-2__ times per day.   4. Leg Lengthener    Straighten one leg. Pull toes AND forefoot toward knee, extend heel. Lengthen leg by pulling pelvis away from ribs. Hold _2-3__ seconds. Relax. Repeat __4-6__ times. Do other leg.  Surface: floor   5. Leg Press    Straighten one leg down to floor keeping leg aligned with hip. Pull toes AND forefoot toward knee; extend heel.  Press entire leg downward (as if pressing leg into sandy beach). DO NOT BEND KNEE. Hold _2-3__ seconds. Do __4-6__ times. Repeat with other  leg.   PHYSICAL THERAPY DISCHARGE SUMMARY  Visits from Start of Care: 14  Current functional level related to goals / functional outcomes: All goals met   Remaining deficits: None   Education / Equipment: HEP, posture education   Patient agrees to discharge. Patient goals were met. Patient is being discharged due to meeting the stated rehab goals.  Florina Sever Gully, [place] 08/30/24 3:56 PM   "

## 2024-08-31 ENCOUNTER — Other Ambulatory Visit: Payer: Self-pay | Admitting: Family Medicine

## 2024-08-31 DIAGNOSIS — B20 Human immunodeficiency virus [HIV] disease: Secondary | ICD-10-CM

## 2024-08-31 NOTE — Telephone Encounter (Signed)
 CRM # [id number] Owner: None Status: Resolved Open  Priority: Routine Created on: 08/31/2024 11:05 AM By: Clemetine Ole PARAS   Primary Information  Source  Veronica Robles (Patient)   Subject  Veronica Robles (Patient)   Topic  Clinical - Medication Refill    Communication  Medication: emtricitabine -tenofovir  AF (DESCOVY ) 200-25 MG tablet    fostemsavir tromethamine  (RUKOBIA ) 600 MG TB12 ER tablet            Has the patient contacted their pharmacy? Yes    (Agent: If no, request that the patient contact the pharmacy for the refill. If patient does not wish to contact the pharmacy document the reason why and proceed with request.)    (Agent: If yes, when and what did the pharmacy advise?) said to call us         This is the patient's preferred pharmacy:        [id number] - [place], [place] - [address] AT [facility] OF [address]    [address] [place]  [address]    Phone: [phone] Fax: [phone]    Hours: Open 24 hours                Is this the correct pharmacy for this prescription? Yes    If no, delete pharmacy and type the correct one.        Has the prescription been filled recently? No        Is the patient out of the medication? Yes        Has the patient been seen for an appointment in the last year OR does the patient have an upcoming appointment? Yes        Can we respond through MyChart? No      Agent: Please be advised that Rx refills may take up to 3 business days. We ask that you follow-up with your pharmacy.

## 2024-09-01 ENCOUNTER — Inpatient Hospital Stay: Admitting: Hematology and Oncology

## 2024-09-01 VITALS — BP 142/74 | HR 87 | Temp 98.2°F | Resp 18 | Ht 62.0 in | Wt 137.9 lb

## 2024-09-01 DIAGNOSIS — C50412 Malignant neoplasm of upper-outer quadrant of left female breast: Secondary | ICD-10-CM

## 2024-09-01 DIAGNOSIS — Z170 Estrogen receptor positive status [ER+]: Secondary | ICD-10-CM

## 2024-09-01 MED ORDER — ANASTROZOLE 1 MG PO TABS: 1.0000 mg | ORAL_TABLET | Freq: Every day | ORAL | 3 refills | Status: AC

## 2024-09-01 NOTE — Progress Notes (Signed)
 "  Patient Care Team: Tanda Bleacher, MD as PCP - General (Family Medicine) Luiz Channel, MD as PCP - Infectious Diseases (Infectious Diseases) Odean Potts, MD as Consulting Physician (Hematology and Oncology) Dewey Rush, MD as Consulting Physician (Radiation Oncology) Vernetta Berg, MD as Consulting Physician (General Surgery)  DIAGNOSIS:  Encounter Diagnosis  Name Primary?   Malignant neoplasm of upper-outer quadrant of left breast in female, estrogen receptor positive (HCC) Yes    SUMMARY OF ONCOLOGIC HISTORY: Oncology History  Malignant neoplasm of upper-outer quadrant of left breast in female, estrogen receptor positive (HCC)  02/28/2022 Initial Diagnosis   Screening detected left breast mass at 3 o'clock position measuring 0.9 cm, 4 enlarged left axillary lymph nodes along with asymmetric ducts between mass and the nipple possibly DCIS; lymph node biopsy: Positive, breast biopsy: Grade 2 IDC ER 100%, PR 0%, HER2 negative, Ki-67 10%   03/10/2022 Cancer Staging   Staging form: Breast, AJCC 8th Edition - Clinical stage from 03/10/2022: Stage IIA (cT1b, cN1(f), cM0, G2, ER+, PR-, HER2-) - Signed by Lanell Donald Stagger, PA-C on 03/10/2022 Stage prefix: Initial diagnosis Method of lymph node assessment: Core biopsy Histologic grading system: 3 grade system   03/27/2022 - 08/08/2022 Chemotherapy   Patient is on Treatment Plan : BREAST ADJUVANT DOSE DENSE AC q14d / PACLitaxel  q7d      08/27/2022 Surgery   Left lumpectomy: Grade 2 IDC 1.9 cm, margins negative, 3/4 lymph nodes positive ER 100%, PR 0%, HER2 negative, Ki-67 10%    10/02/2022 - 11/17/2022 Radiation Therapy   Plan Name: Breast_L_BH Site: Breast, Left Technique: 3D Mode: Photon Dose Per Fraction: 1.8 Gy Prescribed Dose (Delivered / Prescribed): 50.4 Gy / 50.4 Gy Prescribed Fxs (Delivered / Prescribed): 28 / 28   Plan Name: Brst_L_SCV_BH Site: Breast, Left Technique: 3D Mode: Photon Dose Per Fraction: 1.8  Gy Prescribed Dose (Delivered / Prescribed): 50.4 Gy / 50.4 Gy Prescribed Fxs (Delivered / Prescribed): 28 / 28   Plan Name: Brst_L_Bst_BH Site: Breast, Left Technique: 3D Mode: Photon Dose Per Fraction: 2 Gy Prescribed Dose (Delivered / Prescribed): 10 Gy / 10 Gy Prescribed Fxs (Delivered / Prescribed): 5 / 5   11/2022 -  Anti-estrogen oral therapy   Anastrozole  daily     CHIEF COMPLIANT: Follow-up on anastrozole  therapy  HISTORY OF PRESENT ILLNESS:  History of Present Illness Veronica Robles is a 69 year old female with 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  She has taken anastrozole  for over two years and recently finished her last 90-day supply, so she needs a refill today.  She previously completed neoadjuvant chemotherapy, lumpectomy, and adjuvant radiation. Her energy and memory are at baseline with only mild age-appropriate forgetfulness. She has persistent mild hot flashes and slow hair regrowth since chemotherapy.  Her last mammogram on April 14, 2024 was normal with B density. She has occasional mild breast discomfort that she finds worrisome but has no new or other concerning symptoms.       ALLERGIES:  is allergic to penicillins.  MEDICATIONS:  Current Outpatient Medications  Medication Sig Dispense Refill   Blood Pressure Monitoring (ADULT BLOOD PRESSURE CUFF LG) KIT       emtricitabine -tenofovir  AF (DESCOVY ) 200-25 MG tablet Take 1 tablet by mouth daily. 30 tablet 5   fostemsavir tromethamine  (RUKOBIA ) 600 MG TB12 ER tablet Take 1 tablet (600 mg total) by mouth 2 (two) times daily. 60 tablet 5   hydrochlorothiazide  (  HYDRODIURIL ) 25 MG tablet Take 1 tablet (25 mg total) by mouth daily. 90 tablet 1   polyethylene glycol powder (MIRALAX ) 17 GM/SCOOP powder Take 17 g by mouth daily. 850 g 1   SQ injection lenacapavir  (SUNLENCA ) 463.5 MG/1.5ML SQ  injection Inject 3 mLs (927 mg total) into the skin every 6 (six) months. Administer each injection subcutaneously at separate sites in the abdomen (more or equal to 2 inches from the navel). 3 mL 2   anastrozole  (ARIMIDEX ) 1 MG tablet Take 1 tablet (1 mg total) by mouth daily. 90 tablet 3   No current facility-administered medications for this visit.    PHYSICAL EXAMINATION: ECOG PERFORMANCE STATUS: 1 - Symptomatic but completely ambulatory  Vitals:   09/01/24 1347  BP: (!) 142/74  Pulse: 87  Resp: 18  Temp: 98.2 F (36.8 C)  SpO2: 99%   Filed Weights   09/01/24 1347  Weight: 137 lb 14.4 oz (62.6 kg)    Physical Exam Breast exam: Benign  (exam performed in the presence of a chaperone)  LABORATORY DATA:  I have reviewed the data as listed    Latest Ref Rng & Units 05/26/2024    3:02 PM 02/11/2024    1:48 PM 08/27/2023    2:46 PM  CMP  Glucose 65 - 99 mg/dL 883  875  90   BUN 7 - 25 mg/dL 13  10  13    Creatinine 0.50 - 1.05 mg/dL 9.08  9.08  9.11   Sodium 135 - 146 mmol/L 139  140  141   Potassium 3.5 - 5.3 mmol/L 3.6  3.7  3.4   Chloride 98 - 110 mmol/L 102  102  101   CO2 20 - 32 mmol/L 26  28  32   Calcium 8.6 - 10.4 mg/dL 9.6  9.4  9.5   Total Protein 6.1 - 8.1 g/dL 6.9  6.5  6.6   Total Bilirubin 0.2 - 1.2 mg/dL 0.5  0.3  0.4   AST 10 - 35 U/L 21  21  23    ALT 6 - 29 U/L 20  15  20      Lab Results  Component Value Date   WBC 6.1 05/26/2024   HGB 12.5 05/26/2024   HCT 38.0 05/26/2024   MCV 87.0 05/26/2024   PLT 185 05/26/2024   NEUTROABS 3,251 05/26/2024    ASSESSMENT & PLAN:  Malignant neoplasm of upper-outer quadrant of left breast in female, estrogen receptor positive (HCC) 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; lymph node biopsy: Positive, breast biopsy: Grade 2 IDC ER 100%, PR 0%, HER2 negative,  Ki-67 10%   Treatment plan: 1.  Neoadjuvant chemotherapy with dose dense Adriamycin  and Cytoxan  x4 followed by Taxol  weekly x12 completed 08/08/2022 2. breast conserving surgery with targeted node dissection 08/27/2022: Left lumpectomy: Grade 2 IDC 1.9 cm, margins negative, 3/4 lymph nodes positive ER 100%, PR 0%, HER2 negative, Ki-67 10% 3.  Adjuvant radiation completed 11/17/2022 4.  Follow-up antiestrogen therapy with anastrozole  (started May 2024) and Verzenio (patient refused) --------------------------------------------------------------------------------------------------------------------------------- Treatment plan: Adjuvant antiestrogen therapy with anastrozole  1 mg daily x 7 years   Anastrozole  toxicities: Tolerating it extremely well without any problems or concerns Mild hot flashes  Breast cancer surveillance: Breast exam 09/01/2024: Benign Mammogram 04/14/2024 at [facility]: Benign breast density category B  Patient is due for a bone density test  I will order this for June 2026. Return to clinic in 1 year for follow-up      Orders Placed This Encounter  Procedures   DG Bone Density    Standing Status:   Future    Expected Date:   12/29/2024    Expiration Date:   09/01/2025    Reason for Exam (SYMPTOM  OR DIAGNOSIS REQUIRED):   Post menopausal    Preferred imaging location?:   [facility]    Release to patient:   Immediate   The patient has a good understanding of the overall plan. she agrees with it. she will call with any problems that may develop before the next visit here.  I personally spent a total of 30 minutes in the care of the patient today including preparing to see the patient, getting/reviewing separately obtained history, performing a medically appropriate exam/evaluation, counseling and educating, placing orders, referring and communicating with other health care professionals, documenting clinical information in the EHR, independently interpreting results,  communicating results, and coordinating care.   Dr.Rylea Selway 09/01/24    "

## 2024-09-01 NOTE — Assessment & Plan Note (Signed)
 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  Neoadjuvant chemotherapy with dose dense Adriamycin  and Cytoxan  x4 followed by Taxol  weekly x12 completed 08/08/2022 2. breast conserving surgery with targeted node dissection 08/27/2022: Left lumpectomy: Grade 2 IDC 1.9 cm, margins negative, 3/4 lymph nodes positive ER 100%, PR 0%, HER2 negative, Ki-67 10% 3.  Adjuvant radiation completed 11/17/2022 4.  Follow-up antiestrogen therapy with anastrozole  (started May 2024) and Verzenio (patient refused) --------------------------------------------------------------------------------------------------------------------------------- Treatment plan: Adjuvant antiestrogen therapy with anastrozole  1 mg daily x 7 years   Anastrozole  toxicities: Tolerating it extremely well without any problems or concerns Breast cancer surveillance: Breast exam 09/01/2024: Benign Mammogram 04/14/2024 at [facility]: Benign breast density category B  Return to clinic in 1 year for follow-up

## 2024-09-02 ENCOUNTER — Other Ambulatory Visit (HOSPITAL_COMMUNITY): Payer: Self-pay

## 2024-09-02 ENCOUNTER — Other Ambulatory Visit: Payer: Self-pay

## 2024-09-02 NOTE — Telephone Encounter (Signed)
 Requested Prescriptions  Refused Prescriptions Disp Refills   emtricitabine -tenofovir  AF (DESCOVY ) 200-25 MG tablet 30 tablet 5    Sig: Take 1 tablet by mouth daily.     Off-Protocol Failed - 09/02/2024 10:12 AM      Failed - Medication not assigned to a protocol, review manually.      Passed - Valid encounter within last 12 months    Recent Outpatient Visits           4 months ago HYPERTENSION, BENIGN   [place] [facility], [place]   10 months ago Essential hypertension   [place] [facility], MD   1 year ago Essential hypertension   [place] [facility], MD   1 year ago HYPERTENSION, BENIGN   [place] [facility], MD   2 years ago HYPERTENSION, BENIGN   [place] [facility], MD       Future Appointments             In 2 weeks Luiz Channel, MD [facility] - [facility] Dept Of [place]. [facility]             fostemsavir tromethamine  (RUKOBIA ) 600 MG TB12 ER tablet 60 tablet 5    Sig: Take 1 tablet (600 mg total) by mouth 2 (two) times daily.     Off-Protocol Failed - 09/02/2024 10:12 AM      Failed - Medication not assigned to a protocol, review manually.      Passed - Valid encounter within last 12 months    Recent Outpatient Visits           4 months ago HYPERTENSION, BENIGN   [place] [facility], MD   10 months ago Essential hypertension   [place] [facility], MD   1 year ago Essential hypertension   [place] [facility], MD   1 year ago HYPERTENSION, BENIGN   [place] [facility], MD   2 years ago HYPERTENSION, BENIGN    [facility], MD       Future  Appointments             In 2 weeks Luiz Channel, MD [facility] - A Dept Of [place]. [facility], [place]

## 2024-09-22 ENCOUNTER — Ambulatory Visit: Admitting: Internal Medicine

## 2024-10-04 ENCOUNTER — Ambulatory Visit: Admitting: Pharmacist

## 2024-11-03 ENCOUNTER — Ambulatory Visit: Admitting: Family Medicine

## 2024-12-05 ENCOUNTER — Ambulatory Visit: Attending: Hematology and Oncology

## 2025-05-15 ENCOUNTER — Encounter: Payer: Self-pay | Admitting: Family Medicine

## 2025-08-31 ENCOUNTER — Inpatient Hospital Stay: Admitting: Hematology and Oncology

## 2025-09-07 ENCOUNTER — Ambulatory Visit: Payer: Self-pay
